# Patient Record
Sex: Female | Born: 1935 | Race: White | Hispanic: No | Smoking: Former smoker
Health system: Southern US, Community
[De-identification: ages and names within clinical notes are randomized; demographics above are authoritative.]

## PROBLEM LIST (undated history)

## (undated) DIAGNOSIS — I10 Essential (primary) hypertension: Secondary | ICD-10-CM

## (undated) DIAGNOSIS — I739 Peripheral vascular disease, unspecified: Secondary | ICD-10-CM

## (undated) DIAGNOSIS — E785 Hyperlipidemia, unspecified: Secondary | ICD-10-CM

## (undated) DIAGNOSIS — I639 Cerebral infarction, unspecified: Secondary | ICD-10-CM

## (undated) DIAGNOSIS — K5730 Diverticulosis of large intestine without perforation or abscess without bleeding: Secondary | ICD-10-CM

## (undated) DIAGNOSIS — H3530 Unspecified macular degeneration: Secondary | ICD-10-CM

## (undated) DIAGNOSIS — G459 Transient cerebral ischemic attack, unspecified: Secondary | ICD-10-CM

## (undated) DIAGNOSIS — C189 Malignant neoplasm of colon, unspecified: Secondary | ICD-10-CM

## (undated) DIAGNOSIS — M1990 Unspecified osteoarthritis, unspecified site: Secondary | ICD-10-CM

## (undated) DIAGNOSIS — K922 Gastrointestinal hemorrhage, unspecified: Secondary | ICD-10-CM

## (undated) DIAGNOSIS — K635 Polyp of colon: Secondary | ICD-10-CM

## (undated) DIAGNOSIS — J189 Pneumonia, unspecified organism: Secondary | ICD-10-CM

## (undated) HISTORY — DX: Polyp of colon: K63.5

## (undated) HISTORY — DX: Unspecified osteoarthritis, unspecified site: M19.90

## (undated) HISTORY — PX: TONSILLECTOMY: SUR1361

## (undated) HISTORY — DX: Cerebral infarction, unspecified: I63.9

## (undated) HISTORY — PX: BREAST LUMPECTOMY: SHX2

## (undated) HISTORY — DX: Gastrointestinal hemorrhage, unspecified: K92.2

## (undated) HISTORY — DX: Essential (primary) hypertension: I10

## (undated) HISTORY — DX: Diverticulosis of large intestine without perforation or abscess without bleeding: K57.30

## (undated) HISTORY — PX: EYE SURGERY: SHX253

## (undated) HISTORY — DX: Transient cerebral ischemic attack, unspecified: G45.9

## (undated) HISTORY — PX: APPENDECTOMY: SHX54

## (undated) HISTORY — PX: COLON SURGERY: SHX602

## (undated) HISTORY — DX: Hyperlipidemia, unspecified: E78.5

## (undated) HISTORY — DX: Malignant neoplasm of colon, unspecified: C18.9

## (undated) HISTORY — PX: BACK SURGERY: SHX140

---

## 1998-01-06 ENCOUNTER — Other Ambulatory Visit: Admission: RE | Admit: 1998-01-06 | Discharge: 1998-01-06 | Payer: Self-pay | Admitting: *Deleted

## 1999-01-14 ENCOUNTER — Other Ambulatory Visit: Admission: RE | Admit: 1999-01-14 | Discharge: 1999-01-14 | Payer: Self-pay | Admitting: *Deleted

## 1999-12-29 ENCOUNTER — Other Ambulatory Visit: Admission: RE | Admit: 1999-12-29 | Discharge: 1999-12-29 | Payer: Self-pay | Admitting: *Deleted

## 2001-01-01 ENCOUNTER — Other Ambulatory Visit: Admission: RE | Admit: 2001-01-01 | Discharge: 2001-01-01 | Payer: Self-pay | Admitting: *Deleted

## 2001-06-10 ENCOUNTER — Emergency Department (HOSPITAL_COMMUNITY): Admission: EM | Admit: 2001-06-10 | Discharge: 2001-06-11 | Payer: Self-pay

## 2001-06-10 ENCOUNTER — Encounter: Payer: Self-pay | Admitting: Emergency Medicine

## 2001-06-11 ENCOUNTER — Encounter: Payer: Self-pay | Admitting: Emergency Medicine

## 2002-07-16 ENCOUNTER — Other Ambulatory Visit: Admission: RE | Admit: 2002-07-16 | Discharge: 2002-07-16 | Payer: Self-pay | Admitting: *Deleted

## 2004-01-01 ENCOUNTER — Ambulatory Visit: Payer: Self-pay | Admitting: Internal Medicine

## 2004-01-07 ENCOUNTER — Ambulatory Visit: Payer: Self-pay | Admitting: Internal Medicine

## 2004-06-09 ENCOUNTER — Ambulatory Visit: Payer: Self-pay | Admitting: Internal Medicine

## 2004-07-14 ENCOUNTER — Ambulatory Visit: Payer: Self-pay | Admitting: Internal Medicine

## 2004-07-20 ENCOUNTER — Ambulatory Visit: Payer: Self-pay | Admitting: Internal Medicine

## 2004-12-10 ENCOUNTER — Ambulatory Visit: Payer: Self-pay | Admitting: Internal Medicine

## 2005-01-03 ENCOUNTER — Ambulatory Visit: Payer: Self-pay | Admitting: Internal Medicine

## 2005-01-10 ENCOUNTER — Ambulatory Visit: Payer: Self-pay | Admitting: Internal Medicine

## 2005-01-21 ENCOUNTER — Ambulatory Visit: Payer: Self-pay | Admitting: Internal Medicine

## 2005-02-03 ENCOUNTER — Ambulatory Visit: Payer: Self-pay | Admitting: Internal Medicine

## 2005-07-12 ENCOUNTER — Ambulatory Visit: Payer: Self-pay | Admitting: Internal Medicine

## 2005-07-15 ENCOUNTER — Encounter: Payer: Self-pay | Admitting: Internal Medicine

## 2005-07-15 LAB — CONVERTED CEMR LAB

## 2005-08-11 ENCOUNTER — Ambulatory Visit: Payer: Self-pay | Admitting: Internal Medicine

## 2005-08-12 ENCOUNTER — Encounter (INDEPENDENT_AMBULATORY_CARE_PROVIDER_SITE_OTHER): Payer: Self-pay | Admitting: *Deleted

## 2005-08-12 ENCOUNTER — Ambulatory Visit: Payer: Self-pay | Admitting: Cardiology

## 2005-11-25 ENCOUNTER — Ambulatory Visit: Payer: Self-pay | Admitting: Internal Medicine

## 2006-01-12 ENCOUNTER — Ambulatory Visit: Payer: Self-pay | Admitting: Internal Medicine

## 2006-01-12 LAB — CONVERTED CEMR LAB
ALT: 21 units/L (ref 0–40)
AST: 29 units/L (ref 0–37)
Albumin: 3.8 g/dL (ref 3.5–5.2)
Alkaline Phosphatase: 71 units/L (ref 39–117)
BUN: 23 mg/dL (ref 6–23)
Basophils Absolute: 0 10*3/uL (ref 0.0–0.1)
Basophils Relative: 0.5 % (ref 0.0–1.0)
Bilirubin Urine: NEGATIVE
CO2: 30 meq/L (ref 19–32)
Calcium: 9.6 mg/dL (ref 8.4–10.5)
Chloride: 103 meq/L (ref 96–112)
Chol/HDL Ratio, serum: 3.4
Cholesterol: 190 mg/dL (ref 0–200)
Creatinine, Ser: 1 mg/dL (ref 0.4–1.2)
Eosinophil percent: 3.2 % (ref 0.0–5.0)
GFR calc non Af Amer: 58 mL/min
Glomerular Filtration Rate, Af Am: 70 mL/min/{1.73_m2}
Glucose, Bld: 109 mg/dL — ABNORMAL HIGH (ref 70–99)
HCT: 40.4 % (ref 36.0–46.0)
HDL: 56.5 mg/dL (ref >39.0)
Hemoglobin, Urine: NEGATIVE
Hemoglobin: 13.4 g/dL (ref 12.0–15.0)
Ketones, ur: NEGATIVE mg/dL
LDL Cholesterol: 95 mg/dL (ref 0–99)
Leukocytes, UA: NEGATIVE
Lymphocytes Relative: 33.9 % (ref 12.0–46.0)
MCHC: 33.1 g/dL (ref 30.0–36.0)
MCV: 98.6 fL (ref 78.0–100.0)
Monocytes Absolute: 0.6 10*3/uL (ref 0.2–0.7)
Monocytes Relative: 9.5 % (ref 3.0–11.0)
Neutro Abs: 3.1 10*3/uL (ref 1.4–7.7)
Neutrophils Relative %: 52.9 % (ref 43.0–77.0)
Nitrite: NEGATIVE
Platelets: 208 10*3/uL (ref 150–400)
Potassium: 4.7 meq/L (ref 3.5–5.1)
RBC: 4.1 M/uL (ref 3.87–5.11)
RDW: 13.4 % (ref 11.5–14.6)
Sodium: 141 meq/L (ref 135–145)
Specific Gravity, Urine: 1.025 (ref 1.000–1.03)
TSH: 3.05 microintl units/mL (ref 0.35–5.50)
Total Bilirubin: 0.8 mg/dL (ref 0.3–1.2)
Total Protein, Urine: NEGATIVE mg/dL
Total Protein: 7 g/dL (ref 6.0–8.3)
Triglyceride fasting, serum: 192 mg/dL — ABNORMAL HIGH (ref 0–149)
Urine Glucose: NEGATIVE mg/dL
Urobilinogen, UA: 0.2 (ref 0.0–1.0)
VLDL: 38 mg/dL (ref 0–40)
WBC: 5.9 10*3/uL (ref 4.5–10.5)
pH: 5.5 (ref 5.0–8.0)

## 2006-09-08 DIAGNOSIS — I10 Essential (primary) hypertension: Secondary | ICD-10-CM | POA: Insufficient documentation

## 2006-09-08 DIAGNOSIS — J309 Allergic rhinitis, unspecified: Secondary | ICD-10-CM | POA: Insufficient documentation

## 2006-09-08 DIAGNOSIS — M109 Gout, unspecified: Secondary | ICD-10-CM | POA: Insufficient documentation

## 2006-09-08 DIAGNOSIS — E785 Hyperlipidemia, unspecified: Secondary | ICD-10-CM | POA: Insufficient documentation

## 2006-09-11 ENCOUNTER — Encounter: Payer: Self-pay | Admitting: Internal Medicine

## 2006-11-27 ENCOUNTER — Ambulatory Visit: Payer: Self-pay | Admitting: Internal Medicine

## 2007-01-04 ENCOUNTER — Encounter: Payer: Self-pay | Admitting: Internal Medicine

## 2007-01-09 ENCOUNTER — Encounter: Payer: Self-pay | Admitting: Internal Medicine

## 2007-01-14 ENCOUNTER — Encounter: Payer: Self-pay | Admitting: Internal Medicine

## 2007-01-15 ENCOUNTER — Encounter: Payer: Self-pay | Admitting: Internal Medicine

## 2007-01-16 ENCOUNTER — Ambulatory Visit: Payer: Self-pay | Admitting: Internal Medicine

## 2007-05-22 ENCOUNTER — Encounter: Payer: Self-pay | Admitting: Internal Medicine

## 2007-08-16 ENCOUNTER — Encounter: Payer: Self-pay | Admitting: Internal Medicine

## 2007-08-24 ENCOUNTER — Ambulatory Visit: Payer: Self-pay | Admitting: Internal Medicine

## 2007-10-12 ENCOUNTER — Ambulatory Visit: Payer: Self-pay | Admitting: Internal Medicine

## 2007-10-26 ENCOUNTER — Ambulatory Visit: Payer: Self-pay | Admitting: Internal Medicine

## 2007-11-09 ENCOUNTER — Ambulatory Visit: Payer: Self-pay | Admitting: Internal Medicine

## 2007-11-12 ENCOUNTER — Ambulatory Visit: Payer: Self-pay | Admitting: Internal Medicine

## 2007-11-12 ENCOUNTER — Encounter: Payer: Self-pay | Admitting: Internal Medicine

## 2007-11-13 ENCOUNTER — Ambulatory Visit: Payer: Self-pay | Admitting: Internal Medicine

## 2007-11-13 ENCOUNTER — Encounter: Payer: Self-pay | Admitting: Internal Medicine

## 2007-12-03 ENCOUNTER — Ambulatory Visit: Payer: Self-pay | Admitting: Internal Medicine

## 2007-12-03 ENCOUNTER — Telehealth: Payer: Self-pay | Admitting: Internal Medicine

## 2007-12-04 ENCOUNTER — Telehealth: Payer: Self-pay | Admitting: Internal Medicine

## 2007-12-04 ENCOUNTER — Encounter: Payer: Self-pay | Admitting: Internal Medicine

## 2007-12-18 ENCOUNTER — Telehealth: Payer: Self-pay | Admitting: Internal Medicine

## 2007-12-25 ENCOUNTER — Encounter: Payer: Self-pay | Admitting: Internal Medicine

## 2008-01-02 ENCOUNTER — Ambulatory Visit: Payer: Self-pay | Admitting: Internal Medicine

## 2008-01-02 ENCOUNTER — Telehealth: Payer: Self-pay | Admitting: Internal Medicine

## 2008-01-03 ENCOUNTER — Encounter: Payer: Self-pay | Admitting: Internal Medicine

## 2008-01-04 ENCOUNTER — Telehealth: Payer: Self-pay | Admitting: Internal Medicine

## 2008-01-07 ENCOUNTER — Encounter: Payer: Self-pay | Admitting: Internal Medicine

## 2008-01-08 ENCOUNTER — Encounter: Payer: Self-pay | Admitting: Internal Medicine

## 2008-01-23 ENCOUNTER — Ambulatory Visit: Payer: Self-pay | Admitting: Internal Medicine

## 2008-01-23 LAB — CONVERTED CEMR LAB
ALT: 24 units/L (ref 0–35)
AST: 26 units/L (ref 0–37)
Albumin: 3.5 g/dL (ref 3.5–5.2)
Alkaline Phosphatase: 64 units/L (ref 39–117)
BUN: 23 mg/dL (ref 6–23)
Basophils Absolute: 0 10*3/uL (ref 0.0–0.1)
Basophils Relative: 0.3 % (ref 0.0–3.0)
Bilirubin, Direct: 0.1 mg/dL (ref 0.0–0.3)
CO2: 36 meq/L — ABNORMAL HIGH (ref 19–32)
Calcium: 10.3 mg/dL (ref 8.4–10.5)
Chloride: 104 meq/L (ref 96–112)
Cholesterol: 145 mg/dL (ref 0–200)
Creatinine, Ser: 0.9 mg/dL (ref 0.4–1.2)
Eosinophils Absolute: 0.1 10*3/uL (ref 0.0–0.7)
Eosinophils Relative: 1.1 % (ref 0.0–5.0)
GFR calc Af Amer: 79 mL/min
GFR calc non Af Amer: 65 mL/min
Glucose, Bld: 94 mg/dL (ref 70–99)
HCT: 36.6 % (ref 36.0–46.0)
HDL: 44 mg/dL (ref >39.0)
Hemoglobin: 12.4 g/dL (ref 12.0–15.0)
LDL Cholesterol: 68 mg/dL (ref 0–99)
Lymphocytes Relative: 22.2 % (ref 12.0–46.0)
MCHC: 33.9 g/dL (ref 30.0–36.0)
MCV: 97.6 fL (ref 78.0–100.0)
Monocytes Absolute: 0.9 10*3/uL (ref 0.1–1.0)
Monocytes Relative: 11.4 % (ref 3.0–12.0)
Neutro Abs: 5.2 10*3/uL (ref 1.4–7.7)
Neutrophils Relative %: 65 % (ref 43.0–77.0)
Platelets: 160 10*3/uL (ref 150–400)
Potassium: 4.5 meq/L (ref 3.5–5.1)
RBC: 3.75 M/uL — ABNORMAL LOW (ref 3.87–5.11)
RDW: 13.9 % (ref 11.5–14.6)
Sodium: 143 meq/L (ref 135–145)
Total Bilirubin: 0.8 mg/dL (ref 0.3–1.2)
Total CHOL/HDL Ratio: 3.3
Total Protein: 7.1 g/dL (ref 6.0–8.3)
Triglycerides: 167 mg/dL — ABNORMAL HIGH (ref 0–149)
Uric Acid, Serum: 4.8 mg/dL (ref 2.4–7.0)
VLDL: 33 mg/dL (ref 0–40)
WBC: 8 10*3/uL (ref 4.5–10.5)

## 2008-02-14 ENCOUNTER — Telehealth (INDEPENDENT_AMBULATORY_CARE_PROVIDER_SITE_OTHER): Payer: Self-pay | Admitting: *Deleted

## 2008-02-22 ENCOUNTER — Ambulatory Visit: Payer: Self-pay | Admitting: Internal Medicine

## 2008-04-08 ENCOUNTER — Telehealth: Payer: Self-pay | Admitting: Internal Medicine

## 2008-04-24 DIAGNOSIS — K5790 Diverticulosis of intestine, part unspecified, without perforation or abscess without bleeding: Secondary | ICD-10-CM | POA: Insufficient documentation

## 2008-04-24 DIAGNOSIS — Z860100 Personal history of colon polyps, unspecified: Secondary | ICD-10-CM | POA: Insufficient documentation

## 2008-04-24 DIAGNOSIS — Z8719 Personal history of other diseases of the digestive system: Secondary | ICD-10-CM | POA: Insufficient documentation

## 2008-04-29 ENCOUNTER — Ambulatory Visit: Payer: Self-pay | Admitting: Internal Medicine

## 2008-06-27 ENCOUNTER — Telehealth: Payer: Self-pay | Admitting: Internal Medicine

## 2008-06-30 ENCOUNTER — Telehealth: Payer: Self-pay | Admitting: Internal Medicine

## 2008-07-03 ENCOUNTER — Ambulatory Visit: Payer: Self-pay | Admitting: Internal Medicine

## 2008-07-03 LAB — CONVERTED CEMR LAB
Bilirubin Urine: NEGATIVE
Hemoglobin, Urine: NEGATIVE
Ketones, ur: NEGATIVE mg/dL
Leukocytes, UA: NEGATIVE
Nitrite: NEGATIVE
Specific Gravity, Urine: 1.01 (ref 1.000–1.030)
Urine Glucose: NEGATIVE mg/dL
Urobilinogen, UA: 0.2 (ref 0.0–1.0)
pH: 7.5 (ref 5.0–8.0)

## 2008-08-19 ENCOUNTER — Ambulatory Visit: Payer: Self-pay | Admitting: Internal Medicine

## 2008-09-04 ENCOUNTER — Ambulatory Visit: Payer: Self-pay | Admitting: Internal Medicine

## 2008-11-25 ENCOUNTER — Ambulatory Visit: Payer: Self-pay | Admitting: Internal Medicine

## 2009-01-12 ENCOUNTER — Encounter: Payer: Self-pay | Admitting: Internal Medicine

## 2009-01-23 ENCOUNTER — Ambulatory Visit: Payer: Self-pay | Admitting: Internal Medicine

## 2009-01-23 LAB — CONVERTED CEMR LAB
ALT: 20 units/L (ref 0–35)
AST: 29 units/L (ref 0–37)
Albumin: 4 g/dL (ref 3.5–5.2)
Alkaline Phosphatase: 75 units/L (ref 39–117)
BUN: 16 mg/dL (ref 6–23)
Basophils Absolute: 0 10*3/uL (ref 0.0–0.1)
Basophils Relative: 0.5 % (ref 0.0–3.0)
Bilirubin, Direct: 0.2 mg/dL (ref 0.0–0.3)
CO2: 31 meq/L (ref 19–32)
Calcium: 9.3 mg/dL (ref 8.4–10.5)
Chloride: 99 meq/L (ref 96–112)
Cholesterol: 198 mg/dL (ref 0–200)
Creatinine, Ser: 0.8 mg/dL (ref 0.4–1.2)
Direct LDL: 94.8 mg/dL
Eosinophils Absolute: 0.2 10*3/uL (ref 0.0–0.7)
Eosinophils Relative: 2.9 % (ref 0.0–5.0)
GFR calc non Af Amer: 74.21 mL/min (ref >60)
Glucose, Bld: 105 mg/dL — ABNORMAL HIGH (ref 70–99)
HCT: 42.2 % (ref 36.0–46.0)
HDL: 66.7 mg/dL (ref >39.00)
Hemoglobin: 14.1 g/dL (ref 12.0–15.0)
Lymphocytes Relative: 34.6 % (ref 12.0–46.0)
Lymphs Abs: 1.9 10*3/uL (ref 0.7–4.0)
MCHC: 33.4 g/dL (ref 30.0–36.0)
MCV: 101.9 fL — ABNORMAL HIGH (ref 78.0–100.0)
Monocytes Absolute: 0.5 10*3/uL (ref 0.1–1.0)
Monocytes Relative: 9.9 % (ref 3.0–12.0)
Neutro Abs: 2.8 10*3/uL (ref 1.4–7.7)
Neutrophils Relative %: 52.1 % (ref 43.0–77.0)
Platelets: 140 10*3/uL — ABNORMAL LOW (ref 150.0–400.0)
Potassium: 4.2 meq/L (ref 3.5–5.1)
RBC: 4.14 M/uL (ref 3.87–5.11)
RDW: 13.1 % (ref 11.5–14.6)
Sodium: 139 meq/L (ref 135–145)
TSH: 1.62 microintl units/mL (ref 0.35–5.50)
Total Bilirubin: 1.1 mg/dL (ref 0.3–1.2)
Total CHOL/HDL Ratio: 3
Total Protein: 7.6 g/dL (ref 6.0–8.3)
Triglycerides: 222 mg/dL — ABNORMAL HIGH (ref 0.0–149.0)
Uric Acid, Serum: 7.2 mg/dL — ABNORMAL HIGH (ref 2.4–7.0)
VLDL: 44.4 mg/dL — ABNORMAL HIGH (ref 0.0–40.0)
WBC: 5.4 10*3/uL (ref 4.5–10.5)

## 2009-02-13 ENCOUNTER — Encounter: Payer: Self-pay | Admitting: Internal Medicine

## 2009-02-23 ENCOUNTER — Telehealth: Payer: Self-pay | Admitting: Internal Medicine

## 2009-07-22 ENCOUNTER — Ambulatory Visit: Payer: Self-pay | Admitting: Internal Medicine

## 2009-07-22 DIAGNOSIS — M25559 Pain in unspecified hip: Secondary | ICD-10-CM | POA: Insufficient documentation

## 2009-07-27 ENCOUNTER — Ambulatory Visit: Payer: Self-pay | Admitting: Internal Medicine

## 2009-08-24 ENCOUNTER — Telehealth: Payer: Self-pay | Admitting: Internal Medicine

## 2009-10-15 DIAGNOSIS — G459 Transient cerebral ischemic attack, unspecified: Secondary | ICD-10-CM

## 2009-10-15 HISTORY — DX: Transient cerebral ischemic attack, unspecified: G45.9

## 2009-10-27 ENCOUNTER — Encounter: Admission: RE | Admit: 2009-10-27 | Discharge: 2009-10-27 | Payer: Self-pay | Admitting: Orthopedic Surgery

## 2009-11-12 ENCOUNTER — Ambulatory Visit: Payer: Self-pay | Admitting: Diagnostic Radiology

## 2009-11-12 ENCOUNTER — Ambulatory Visit: Payer: Self-pay | Admitting: Internal Medicine

## 2009-11-12 ENCOUNTER — Inpatient Hospital Stay (HOSPITAL_COMMUNITY): Admission: EM | Admit: 2009-11-12 | Discharge: 2009-11-14 | Payer: Self-pay | Admitting: Internal Medicine

## 2009-11-12 ENCOUNTER — Encounter: Payer: Self-pay | Admitting: Emergency Medicine

## 2009-11-13 ENCOUNTER — Encounter (INDEPENDENT_AMBULATORY_CARE_PROVIDER_SITE_OTHER): Payer: Self-pay | Admitting: Internal Medicine

## 2009-11-13 ENCOUNTER — Ambulatory Visit: Payer: Self-pay | Admitting: Vascular Surgery

## 2009-11-17 ENCOUNTER — Ambulatory Visit: Payer: Self-pay | Admitting: Internal Medicine

## 2009-11-17 DIAGNOSIS — G459 Transient cerebral ischemic attack, unspecified: Secondary | ICD-10-CM | POA: Insufficient documentation

## 2009-12-02 ENCOUNTER — Encounter: Payer: Self-pay | Admitting: Internal Medicine

## 2009-12-03 ENCOUNTER — Ambulatory Visit: Payer: Self-pay | Admitting: Internal Medicine

## 2009-12-03 LAB — CONVERTED CEMR LAB
ALT: 21 units/L (ref 0–35)
AST: 25 units/L (ref 0–37)
Albumin: 3.7 g/dL (ref 3.5–5.2)
Alkaline Phosphatase: 64 units/L (ref 39–117)
BUN: 19 mg/dL (ref 6–23)
Bilirubin, Direct: 0.1 mg/dL (ref 0.0–0.3)
CO2: 31 meq/L (ref 19–32)
Calcium: 9.5 mg/dL (ref 8.4–10.5)
Chloride: 101 meq/L (ref 96–112)
Cholesterol: 149 mg/dL (ref 0–200)
Creatinine, Ser: 0.9 mg/dL (ref 0.4–1.2)
GFR calc non Af Amer: 63.01 mL/min (ref >60)
Glucose, Bld: 92 mg/dL (ref 70–99)
HDL: 49.1 mg/dL (ref >39.00)
LDL Cholesterol: 67 mg/dL (ref 0–99)
Potassium: 4.8 meq/L (ref 3.5–5.1)
Sodium: 139 meq/L (ref 135–145)
Total Bilirubin: 0.5 mg/dL (ref 0.3–1.2)
Total CHOL/HDL Ratio: 3
Total Protein: 6.7 g/dL (ref 6.0–8.3)
Triglycerides: 165 mg/dL — ABNORMAL HIGH (ref 0.0–149.0)
VLDL: 33 mg/dL (ref 0.0–40.0)

## 2009-12-08 ENCOUNTER — Ambulatory Visit: Payer: Self-pay | Admitting: Internal Medicine

## 2010-01-12 ENCOUNTER — Telehealth: Payer: Self-pay | Admitting: Internal Medicine

## 2010-01-12 ENCOUNTER — Ambulatory Visit: Payer: Self-pay | Admitting: Internal Medicine

## 2010-01-12 LAB — CONVERTED CEMR LAB
Bilirubin Urine: NEGATIVE
Hemoglobin, Urine: NEGATIVE
Ketones, ur: NEGATIVE mg/dL
Nitrite: NEGATIVE
Specific Gravity, Urine: <=1.005 (ref 1.000–1.030)
Total Protein, Urine: NEGATIVE mg/dL
Urine Glucose: NEGATIVE mg/dL
Urobilinogen, UA: 0.2 (ref 0.0–1.0)
pH: 7 (ref 5.0–8.0)

## 2010-01-13 ENCOUNTER — Encounter: Payer: Self-pay | Admitting: Internal Medicine

## 2010-01-20 ENCOUNTER — Encounter: Payer: Self-pay | Admitting: Internal Medicine

## 2010-02-04 ENCOUNTER — Ambulatory Visit: Payer: Self-pay | Admitting: Internal Medicine

## 2010-02-17 ENCOUNTER — Telehealth: Payer: Self-pay | Admitting: Internal Medicine

## 2010-03-14 LAB — CONVERTED CEMR LAB
ALT: 24 units/L (ref 0–35)
AST: 26 units/L (ref 0–37)
Albumin: 3.8 g/dL (ref 3.5–5.2)
Alkaline Phosphatase: 69 units/L (ref 39–117)
BUN: 17 mg/dL (ref 6–23)
Bilirubin, Direct: 0.1 mg/dL (ref 0.0–0.3)
CO2: 30 meq/L (ref 19–32)
Calcium: 9.7 mg/dL (ref 8.4–10.5)
Chloride: 100 meq/L (ref 96–112)
Cholesterol: 173 mg/dL (ref 0–200)
Creatinine, Ser: 0.8 mg/dL (ref 0.4–1.2)
GFR calc Af Amer: 90 mL/min
GFR calc non Af Amer: 75 mL/min
Glucose, Bld: 112 mg/dL — ABNORMAL HIGH (ref 70–99)
HDL: 55.3 mg/dL (ref >39.0)
LDL Cholesterol: 80 mg/dL (ref 0–99)
Pap Smear: NORMAL
Potassium: 4.4 meq/L (ref 3.5–5.1)
Sodium: 140 meq/L (ref 135–145)
TSH: 2.15 microintl units/mL (ref 0.35–5.50)
Total Bilirubin: 0.7 mg/dL (ref 0.3–1.2)
Total CHOL/HDL Ratio: 3.1
Total Protein: 7.3 g/dL (ref 6.0–8.3)
Triglycerides: 189 mg/dL — ABNORMAL HIGH (ref 0–149)
VLDL: 38 mg/dL (ref 0–40)

## 2010-03-16 NOTE — Progress Notes (Signed)
Summary: UTI   Phone Note Call from Patient   Summary of Call: Pt is on cipro for uti, she thinks she needs to come in for u/a next week to make sure there is no infection. Please advise.  Initial call taken by: Lamar Sprinkles,  Jun 30, 2008 9:55 AM  Follow-up for Phone Call        ok for f/u UA - 595.0 - to denae to handle Follow-up by: Corwin Levins MD,  Jun 30, 2008 10:53 AM  Additional Follow-up for Phone Call Additional follow up Details #1::        done [facility] idx ...[facility] Additional Follow-up by: Windell Norfolk,  Jun 30, 2008 11:04 AM    Additional Follow-up for Phone Call Additional follow up Details #2::    Pt informed  Follow-up by: Lamar Sprinkles,  Jun 30, 2008 2:45 PM

## 2010-03-16 NOTE — Assessment & Plan Note (Signed)
Summary: dr Debby Bud wants f/u appt end of day/cd   Vital Signs:  Patient Profile:   77 Years Old Female Height:     63 inches Weight:      174 pounds Temp:     98.8 degrees F oral Pulse rate:   66 / minute BP sitting:   146 / 80  (left arm) Cuff size:   regular  Vitals Entered By: Zackery Barefoot CMA (February 22, 2008 4:37 PM)                 PCP:  Kieley Akter   History of Present Illness: conference about care plannig for her spouse and herself. Family has been very concerned about the toll it is taking on her to be the 24/7 primary care giver.     Current Allergies: PENICILLIN ASA        Impression & Recommendations:  Problem # 1:  COUNSELING MARITAL&PARTNER PROBLEMS UNSPECIFIED (ICD-V61.10) Met for the purpose of long term planning for both the care of her spouse and what is best and safest for him but just as importantly what is best and safest for her in the long view. she wishes to try adult day care as a way of relieving some of her burden. Hospice and palliative care has been discussed but I do not know if he has a qualifying progrnosis.  I have recommended making a move oa congregate, integrated facility with multiple levels of care. this would be good for both of them. We discussed the logistical issues including property sale to accomplish this. I have encouraged her to start now(!) by looking at various facilities, e.g. [facility], [facility], etc and start thinking about what will be need to prepare the home for sale.   She does intellectually agree and will try the adult day care short-term and will give real consideration to starting the process of developing a long term plan for herself and her husband.  Complete Medication List: 1)  Enalapril Maleate 10 Mg Tabs (Enalapril maleate) .... Once daily 2)  Lipitor 20 Mg Tabs (Atorvastatin calcium) .... Q pm 3)  Allopurinol 100 Mg Tabs (Allopurinol) .... Once daily 4)  Fexofenadine Hcl 60 Mg Tabs (Fexofenadine  hcl) .... Two times a day 5)  Calcium 600 1500 Mg Tabs (Calcium carbonate) .... Two times a day 6)  Bl Vitamin C 500 Mg Tabs (Ascorbic acid) .... Once daily 7)  Multivitamin  .... Once daily 8)  Ocuvite Preservision Tabs (Multiple vitamins-minerals) .... Two times a day 9)  Vitamin D 1000 Unit Tabs (Cholecalciferol) .... Once daily 10)  Promethazine-codeine 6.25-10 Mg/40ml Syrp (Promethazine-codeine) .[name].. 1 tsp q 6 as needed cough

## 2010-03-16 NOTE — Assessment & Plan Note (Signed)
Summary: 3 wk f/u per men/cd   Vital Signs:  Patient profile:   77 year old female Height:      62 inches Weight:      183 pounds BMI:     33.59 O2 Sat:      97 % on Room air Temp:     98.2 degrees F oral Pulse rate:   68 / minute BP sitting:   122 / 64  (left arm) Cuff size:   regular  Vitals Entered By: Bill Salinas CMA (December 08, 2009 11:04 AM)  O2 Flow:  Room air CC: pt here for three week follow up, she states that she is only taking her Enalapril once a day instead of twice a day/ ab   Primary Care Provider:  Illene Regulus, MD  CC:  pt here for three week follow up and she states that she is only taking her Enalapril once a day instead of twice a day/ ab.  History of Present Illness: Patient presents for follow up of recent CVA and aggressive risk factor modification. She  is doing well and feeling well. She had an occular migraine 2 weeks ago but she did well. She has had rapid changes in refraction numbers and she has seen  both Dr. Dione Booze and [facility] and at this point her vision is stable. She is tolerating her medical regimen without problems or side effects.   Current Medications (verified): 1)  Enalapril Maleate 10 Mg  Tabs (Enalapril Maleate) .[name].. 1 Tab Two Times A Day 2)  Lipitor 40 Mg Tabs (Atorvastatin Calcium) .[name].. 1 Once Daily 3)  Allopurinol 100 Mg  Tabs (Allopurinol) .... Once Daily 4)  Fexofenadine Hcl 60 Mg  Tabs (Fexofenadine Hcl) .... Two Times A Day 5)  Calcium 600 1500 Mg  Tabs (Calcium Carbonate) .... Two Times A Day 6)  Bl Vitamin C 500 Mg  Tabs (Ascorbic Acid) .... Once Daily 7)  Multivitamins  Tabs (Multiple Vitamin) .... Take 1 Tablet By Mouth Once A Day 8)  Ocuvite Preservision   Tabs (Multiple Vitamins-Minerals) .... Two Times A Day 9)  Celebrex 200 Mg Caps (Celecoxib) .[name].. 1 By Mouth Two Times A Day Pc As Needed For Arthritis 10)  Fish Oil 1200 Mg Caps (Omega-3 Fatty Acids) .[name].. 1 Cap Two Times A Day 11)  Amlodipine Besylate 5 Mg Tabs  (Amlodipine Besylate) .[name].. 1 Tab Once Daily 12)  Plavix 75 Mg Tabs (Clopidogrel Bisulfate) .[name].. 1 Tab Once Daily 13)  Hydrochlorothiazide 12.5 Mg Caps (Hydrochlorothiazide) .[name].. 1 Cap Once Daily  Allergies (verified): 1)  Penicillin 2)  Asa  Past History:  Past Surgical History: Last updated: 01/16/2007 Lumpectomy-right '86 Tonsillectomy-remote  G6P3  Past Medical History: UNSPECIFIED TRANSIENT CEREBRAL ISCHEMIA (ICD-435.9) HIP PAIN, LEFT (ICD-719.45) COLONIC POLYPS, HYPERPLASTIC, HX OF (ICD-V12.72) DIVERTICULOSIS, COLON (ICD-562.10) CLOSTRIDIUM DIFFICILE COLITIS, HX OF (ICD-V12.79) HYPERTENSION (ICD-401.9) HYPERLIPIDEMIA (ICD-272.4) GOUT (ICD-274.9) ALLERGIC RHINITIS (ICD-477.9)    Physician Roster:        Maxwell Marion PSH reviewed for relevance, FH reviewed for relevance  Review of Systems       The patient complains of vision loss.  The patient denies anorexia, fever, weight loss, weight gain, decreased hearing, chest pain, dyspnea on exertion, abdominal pain, severe indigestion/heartburn, muscle weakness, difficulty walking, depression, abnormal bleeding, and enlarged lymph nodes.    Physical Exam  General:  Well-developed,well-nourished,in no acute distress; alert,appropriate and cooperative throughout examination Head:  Normocephalic and atraumatic without obvious abnormalities. No apparent alopecia or balding. Eyes:  C&S clear. Further exam deferred to opthal Neck:  supple.   Lungs:  normal respiratory effort and normal breath sounds.   Heart:  normal rate and regular rhythm.   Neurologic:  alert & oriented X3, speech clear, cranial nerves II-XII intact, and gait normal.   Skin:  turgor normal, color normal, and no suspicious lesions.   Psych:  Oriented X3, normally interactive, good eye contact, and not anxious appearing.     Impression & Recommendations:  Problem # 1:  UNSPECIFIED TRANSIENT CEREBRAL ISCHEMIA (ICD-435.9) Very good recovery. Discussed  hiatus on plavix for ESI back and will wait at least 8 full weeks before stopping plavix. She will contact ortho to determine the minimum time that she can be off plavix before procedure.  Her updated medication list for this problem includes:    Plavix 75 Mg Tabs (Clopidogrel bisulfate) .[name]... 1 tab once daily  Problem # 2:  HYPERTENSION (ICD-401.9)  Her updated medication list for this problem includes:    Enalapril Maleate 10 Mg Tabs (Enalapril maleate) .[name]... 1 once daily for blood pressure    Amlodipine Besylate 5 Mg Tabs (Amlodipine besylate) .[name]... 1 tab once daily    Hydrochlorothiazide 12.5 Mg Caps (Hydrochlorothiazide) .[name]... 1 cap once daily  BP today: 122/64 Prior BP: 132/70 (11/17/2009)  excellent control on present regimen. Will hold off on changing to Tribenzor  Problem # 3:  HYPERLIPIDEMIA (ICD-272.4) Recent lab with HDL 49 and LDL 67. At goal.  Plan - continue present medications.  Her updated medication list for this problem includes:    Lipitor 40 Mg Tabs (Atorvastatin calcium) .[name]... 1 once daily  Complete Medication List: 1)  Enalapril Maleate 10 Mg Tabs (Enalapril maleate) .[name].. 1 once daily for blood pressure 2)  Lipitor 40 Mg Tabs (Atorvastatin calcium) .[name].. 1 once daily 3)  Allopurinol 100 Mg Tabs (Allopurinol) .... Once daily 4)  Fexofenadine Hcl 60 Mg Tabs (Fexofenadine hcl) .... Two times a day 5)  Calcium 600 1500 Mg Tabs (Calcium carbonate) .... Two times a day 6)  Bl Vitamin C 500 Mg Tabs (Ascorbic acid) .... Once daily 7)  Multivitamins Tabs (Multiple vitamin) .... Take 1 tablet by mouth once a day 8)  Ocuvite Preservision Tabs (Multiple vitamins-minerals) .... Two times a day 9)  Celebrex 200 Mg Caps (Celecoxib) .[name].. 1 by mouth two times a day pc as needed for arthritis 10)  Fish Oil 1200 Mg Caps (Omega-3 fatty acids) .[name].. 1 cap two times a day 11)  Amlodipine Besylate 5 Mg Tabs (Amlodipine besylate) .[name].. 1 tab once daily 12)  Plavix 75 Mg Tabs  (Clopidogrel bisulfate) .[name].. 1 tab once daily 13)  Hydrochlorothiazide 12.5 Mg Caps (Hydrochlorothiazide) .[name].. 1 cap once daily Prescriptions: ALLOPURINOL 100 MG  TABS (ALLOPURINOL) once daily  #90 x 3   Entered and Authorized by:   Jacques Navy MD   Signed by:   Jacques Navy MD on 12/08/2009   Method used:   Faxed to ...       [facility] (mail-order)             , [place]         Ph: [phone]       Fax: [phone]   RxID:   [id number] HYDROCHLOROTHIAZIDE 12.5 MG CAPS (HYDROCHLOROTHIAZIDE) 1 cap once daily  #90 x 3   Entered and Authorized by:   Jacques Navy MD   Signed by:   Jacques Navy MD on 12/08/2009   Method  used:   Faxed to ...       [facility] (mail-order)             , [place]         Ph: [phone]       Fax: [phone]   RxID:   [id number] ENALAPRIL MALEATE 10 MG  TABS (ENALAPRIL MALEATE) 1 once daily for blood pressure  #90 x 3   Entered and Authorized by:   Jacques Navy MD   Signed by:   Jacques Navy MD on 12/08/2009   Method used:   Faxed to ...       [facility] (mail-order)             , [place]         Ph: [phone]       Fax: [phone]   RxID:   [id number] PLAVIX 75 MG TABS (CLOPIDOGREL BISULFATE) 1 tab once daily  #90 x 3   Entered and Authorized by:   Jacques Navy MD   Signed by:   Jacques Navy MD on 12/08/2009   Method used:   Faxed to ...       [facility] (mail-order)             , [place]         Ph: [phone]       Fax: [phone]   RxID:   [id number] AMLODIPINE BESYLATE 5 MG TABS (AMLODIPINE BESYLATE) 1 tab once daily  #90 x 3   Entered and Authorized by:   Jacques Navy MD   Signed by:   Jacques Navy MD on 12/08/2009   Method used:   Faxed to ...       [facility] (mail-order)             , [place]         Ph: [phone]       Fax: [phone]   RxID:   [id number] LIPITOR 40 MG TABS (ATORVASTATIN CALCIUM) 1 once daily  #90 x 3   Entered and Authorized by:   Jacques Navy MD   Signed by:   Jacques Navy MD on 12/08/2009   Method used:   Faxed to ...       [facility] (mail-order)             , [place]         Ph: [phone]       Fax: [phone]   RxID:   [id number]    Orders Added: 1)  Est. Patient Level III [52778]   Immunization History:  Influenza Immunization History:    Influenza:  historical (11/14/2009)   Immunization History:  Influenza Immunization History:    Influenza:  Historical (11/14/2009)

## 2010-03-16 NOTE — Progress Notes (Signed)
Summary: Celebrex  Phone Note Refill Request Call back at Home Phone [phone] Message from:  Patient on August 24, 2009 1:12 PM  Refills Requested: Medication #1:  CELEBREX 200 MG CAPS 1 by mouth two times a day pc as needed for arthritis   Supply Requested: 90day Patient requests mail order of the above. Please advise.  Initial call taken by: Lucious Groves,  August 24, 2009 1:12 PM  Follow-up for Phone Call        ok for celebrex refill for 90 day supply with 3 refills Follow-up by: Jacques Navy MD,  August 24, 2009 1:49 PM  Additional Follow-up for Phone Call Additional follow up Details #1::        Pt informed  Additional Follow-up by: Lamar Sprinkles, CMA,  August 24, 2009 3:18 PM    Prescriptions: CELEBREX 200 MG CAPS (CELECOXIB) 1 by mouth two times a day pc as needed for arthritis  #180 x 3   Entered by:   Lamar Sprinkles, CMA   Authorized by:   Jacques Navy MD   Signed by:   Lamar Sprinkles, CMA on 08/24/2009   Method used:   Faxed to ...       [facility] (retail)             ,          Ph: [phone]       Fax: [phone]   RxID:   [id number]

## 2010-03-16 NOTE — Letter (Signed)
   [facility] [address] [place], [place]  [address] Phone: [phone]      January 16, 2007   [facility] [address] [place], [place] [address]  RE:  LAB RESULTS  Dear  Ms. Darin,  The following is an interpretation of your most recent lab tests.  Please take note of any instructions provided or changes to medications that have resulted from your lab work.  ELECTROLYTES:  Good - no changes needed  KIDNEY FUNCTION TESTS:  Good - no changes needed  LIPID PANEL:  Good - no changes needed Triglyceride: 189   Cholesterol: 173   LDL: 80   HDL: 55.3   Chol/HDL%:  3.1 CALC  THYROID STUDIES:  Thyroid studies normal TSH: 2.15     DIABETIC STUDIES:  Excellent - no changes needed Blood Glucose: 112     CBC:  Good - no changes needed

## 2010-03-16 NOTE — Progress Notes (Signed)
Summary: [facility]  Phone Note From Other Clinic   Caller: [facility] Summary of Call: [facility] called with lab reprt .Marland KitchenMarland KitchenMarland KitchenPt [facility] tested positive for C Diff Initial call taken by: Windell Norfolk,  December 04, 2007 9:48 AM  Follow-up for Phone Call        call patient - positive for C. diff. Continue flagyl. Let me know if diarrhea doesn't stop. Follow-up by: Jacques Navy MD,  December 04, 2007 1:22 PM  Additional Follow-up for Phone Call Additional follow up Details #1::        spoke with pt made her aware of doctors instructions and she said ok will call back if she has any more problems Additional Follow-up by: Windell Norfolk,  December 04, 2007 3:19 PM

## 2010-03-16 NOTE — Letter (Signed)
Summary: BP readings/Patient  BP readings/Patient   Imported By: Sherian Rein 02/25/2009 10:39:40  _____________________________________________________________________  External Attachment:    Type:   Image     Comment:   External Document

## 2010-03-16 NOTE — Progress Notes (Signed)
       New/Updated Medications: ENALAPRIL MALEATE 10 MG  TABS (ENALAPRIL MALEATE) 1 tab two times a day Prescriptions: ENALAPRIL MALEATE 10 MG  TABS (ENALAPRIL MALEATE) 1 tab two times a day  #180 x 3   Entered by:   Ami Bullins CMA   Authorized by:   Jacques Navy MD   Signed by:   Bill Salinas CMA on 02/23/2009   Method used:   Electronically to        [facility]* (mail-order)             ,          Ph: [phone]       Fax: [phone]   RxID:   [id number]

## 2010-03-16 NOTE — Letter (Signed)
Summary: [facility]  [facility]   Imported By: Esmeralda Links D'jimraou 01/31/2008 14:05:31  _____________________________________________________________________  External Attachment:    Type:   Image     Comment:   External Document

## 2010-03-16 NOTE — Assessment & Plan Note (Signed)
Summary: buttock pain/men pt/cd   Vital Signs:  Patient profile:   77 year old female Height:      62 inches Weight:      185 pounds BMI:     33.96 O2 Sat:      96 % on Room air Temp:     97.7 degrees F oral Pulse rate:   72 / minute BP sitting:   160 / 82  (left arm) Cuff size:   regular  Vitals Entered By: Lucious Groves (July 22, 2009 1:18 PM)  O2 Flow:  Room air CC: C/O buttock pain on/off since Jan. Is Patient Diabetic? No Pain Assessment Patient in pain? yes     Location: buttock Intensity: 3 Onset of pain  on/of since jan.   Primary Care Provider:  Illene Regulus, MD  CC:  C/O buttock pain on/off since Jan..  History of Present Illness: C/o dull annoying pain in L buttock x weeks worse w/leg lifting when sitting. No injury  Current Medications (verified): 1)  Enalapril Maleate 10 Mg  Tabs (Enalapril Maleate) .[name].. 1 Tab Two Times A Day 2)  Lipitor 20 Mg  Tabs (Atorvastatin Calcium) .... Q Pm 3)  Allopurinol 100 Mg  Tabs (Allopurinol) .... Once Daily 4)  Fexofenadine Hcl 60 Mg  Tabs (Fexofenadine Hcl) .... Two Times A Day 5)  Calcium 600 1500 Mg  Tabs (Calcium Carbonate) .... Two Times A Day 6)  Bl Vitamin C 500 Mg  Tabs (Ascorbic Acid) .... Once Daily 7)  Multivitamins  Tabs (Multiple Vitamin) .... Take 1 Tablet By Mouth Once A Day 8)  Ocuvite Preservision   Tabs (Multiple Vitamins-Minerals) .... Two Times A Day 9)  Fish Oil 1200mg  .... 1 By Mouth Qd 10)  Tylenol Es .[name].. 1 By Mouth Bid  Allergies (verified): 1)  Penicillin 2)  Asa  Review of Systems  The patient denies fever, abdominal pain, melena, muscle weakness, suspicious skin lesions, and difficulty walking.    Physical Exam  General:  WNWD white female, overweight, in no distress Nose:  no external deformity and no external erythema.   Mouth:  Oral mucosa and oropharynx without lesions or exudates.  Teeth in good repair. Lungs:  Normal respiratory effort, chest expands symmetrically. Lungs are  clear to auscultation, no crackles or wheezes. Heart:  Normal rate and regular rhythm. S1 and S2 normal without gallop, murmur, click, rub or other extra sounds. Abdomen:  soft, non-tender, normal bowel sounds, no distention, and no hepatomegaly.   Msk:  normal ROM, no joint tenderness, no joint swelling, no joint warmth, and no joint deformities.  L ischial bone is tender to palp. Neurologic:  alert & oriented X3, cranial nerves II-XII intact, gait normal, and DTRs symmetrical and normal.   Skin:  turgor normal, color normal, no rashes, and no suspicious lesions.   Psych:  Oriented X3, memory intact for recent and remote, normally interactive, and good eye contact.     Impression & Recommendations:  Problem # 1:  HIP PAIN, LEFT (ICD-719.45) ischial bursitis Assessment New  Dr Luiz Blare if not well Her updated medication list for this problem includes:    Tramadol Hcl 50 Mg Tabs (Tramadol hcl) .[name]... 1-2 tabs by mouth two times a day as needed pain    Celebrex 200 Mg Caps (Celecoxib) .[name]... 1 by mouth two times a day pc as needed for arthritis  Orders: T-Pelvis 1or 2 views ([id number])  Complete Medication List: 1)  Enalapril Maleate 10 Mg Tabs (Enalapril  maleate) .[name].. 1 tab two times a day 2)  Lipitor 20 Mg Tabs (Atorvastatin calcium) .... Q pm 3)  Allopurinol 100 Mg Tabs (Allopurinol) .... Once daily 4)  Fexofenadine Hcl 60 Mg Tabs (Fexofenadine hcl) .... Two times a day 5)  Calcium 600 1500 Mg Tabs (Calcium carbonate) .... Two times a day 6)  Bl Vitamin C 500 Mg Tabs (Ascorbic acid) .... Once daily 7)  Multivitamins Tabs (Multiple vitamin) .... Take 1 tablet by mouth once a day 8)  Ocuvite Preservision Tabs (Multiple vitamins-minerals) .... Two times a day 9)  Tramadol Hcl 50 Mg Tabs (Tramadol hcl) .[name].. 1-2 tabs by mouth two times a day as needed pain 10)  Pennsaid 1.5 % Soln (Diclofenac sodium) .... 3-5 gtt on skin three times a day for pain 11)  Celebrex 200 Mg Caps (Celecoxib) .[name].. 1  by mouth two times a day pc as needed for arthritis  Patient Instructions: 1)  Get a memory foam contour pillow 2)  Ice 3)  Use stretching and balance exercises that I have provided (15 min. or longer every day) Prescriptions: CELEBREX 200 MG CAPS (CELECOXIB) 1 by mouth two times a day pc as needed for arthritis  #60 x 12   Entered and Authorized by:   Tresa Garter MD   Signed by:   Tresa Garter MD on 07/22/2009   Method used:   Print then Give to Patient   RxID:   [id number] PENNSAID 1.5 % SOLN (DICLOFENAC SODIUM) 3-5 gtt on skin three times a day for pain  #1 x 3   Entered and Authorized by:   Tresa Garter MD   Signed by:   Tresa Garter MD on 07/22/2009   Method used:   Print then Give to Patient   RxID:   [id number] TRAMADOL HCL 50 MG TABS (TRAMADOL HCL) 1-2 tabs by mouth two times a day as needed pain  #120 x 3   Entered and Authorized by:   Tresa Garter MD   Signed by:   Tresa Garter MD on 07/22/2009   Method used:   Print then Give to Patient   RxID:   [id number]

## 2010-03-16 NOTE — Procedures (Signed)
Summary: Colonoscopy   Colonoscopy  Procedure date:  11/12/2007  Findings:      Location:  [facility].    Procedures Next Due Date:    Colonoscopy: 11/2012  Patient Name: Gina [name], Gina [name] MRN:  Procedure Procedures: Colonoscopy CPT: [id number].    with biopsy. CPT: [id number].  Personnel: Endoscopist: Alette Kataoka L. Juanda Chance, MD.  Exam Location: Exam performed in Outpatient Clinic. Outpatient  Patient Consent: Procedure, Alternatives, Risks and Benefits discussed, consent obtained, from patient. Consent was obtained by the RN.  Indications  Surveillance of: Adenomatous Polyp(s). Initial polypectomy was performed in 2004. 1-2 Polyps were found at Index Exam. Largest polyp removed was 1 to 5 mm. Prior polyp located in distal colon. Pathology of worst  polyp: hyperplastic. Previous surveillance exam(s) in  1999,  Increased Risk Screening: For family history of colorectal neoplasia, in  parent age at onset: 28.  History  Current Medications: Patient is not currently taking Coumadin.  Pre-Exam Physical: Performed Nov 12, 2007. Cardio-pulmonary exam, Rectal exam, HEENT exam , Abdominal exam, Extremity exam, Neurological exam, Mental status exam WNL.  Comments: Pt. history reviewed/updated, physical exam performed prior to initiation of sedation?yes Exam Exam: Extent of exam reached: Cecum, extent intended: Cecum.  The cecum was identified by appendiceal orifice and IC valve. Duration of exam: time in 5:08 min, out 9:09 min minutes. Colon retroflexion performed. Images taken. ASA Classification: II. Tolerance: good.  Monitoring: Pulse and BP monitoring, Oximetry used. Supplemental O2 given.  Colon Prep Used Miralax for colon prep. Prep results: good.  Sedation Meds: Patient assessed and found to be appropriate for moderate (conscious) sedation. Fentanyl 100 mcg. given IV. Versed 10 mg. given IV.  Findings - NORMAL EXAM: Cecum.  - DIVERTICULOSIS: Descending Colon  to Sigmoid Colon. ICD9: Diverticulosis: 562.10. Comments: moderately severe, thick folds, many shallow diverticuli, no obstruction.  - MULTIPLE POLYPS: Rectum. minimum size 2 mm, maximum size 3 mm. Procedure:  biopsy without cautery, removed, Polyp retrieved, 2 polyps Polyps sent to pathology. ICD9: Colon Polyps: 211.3. Comments: 2 diminutive polyps at 5-8 cm removed .  - NORMAL EXAM: Rectum.   Assessment Abnormal examination, see findings above.  Diagnoses: 562.10: Diverticulosis.  211.3: Colon Polyps.   Comments: 2 diminutive polyps of the rectum removed Events  Unplanned Interventions: No intervention was required.  Unplanned Events: There were no complications. Plans Medication Plan: Await pathology. Fiber supplements: Psyllium 1 Tbsp QD, starting Nov 12, 2007   Patient Education: Patient given standard instructions for: Patient instructed to get routine colonoscopy every 5 years.  Disposition: After procedure patient sent to recovery. After recovery patient sent home.    cc: Illene Regulus, MD      REPORT OF SURGICAL PATHOLOGY   Case #: [id number] Patient Name: Gina [name] Office Chart Number:  N/A   MRN: [id number] Pathologist: Ferd Hibbs. Colonel Bald, MD DOB/Age  Nov 28, 1931 (Age: 77)    Gender: F Date Taken:  11/12/2007 Date Received: 11/12/2007   FINAL DIAGNOSIS   ***MICROSCOPIC EXAMINATION AND DIAGNOSIS***   RECTUM, POLYPS:  - HYPERPLASTIC POLYPS (2). - THERE IS NO EVIDENCE OF MALIGNANCY.    mj Date Reported:  11/13/2007     Ferd Hibbs. Colonel Bald, MD *** Electronically Signed Out By JBK ***   Clinical information Family history of colon cancer.  Personal history of polyps (kv)   specimen(s) obtained Rectum, polyp(s)   Gross Description Received in formalin are tan, soft tissue fragments that are submitted in toto.  Number:  3  Size:  0.3 cm.   Block Summary:  In toto in one cassette.   (JBM:cdc 11/12/07)        cc/     Signed by  Hart Carwin MD on 11/13/2007 at 10:13 PM  ________________________________________________________________________ recall colon 5 years   Signed by Hart Carwin MD on 11/13/2007 at 10:13 PM  ________________________________________________________________________    November 13, 2007 MRN: [id number]    [facility] [address] [place], [place]  [address]    Dear Gina [name],  I am pleased to inform you that the colon polyp(s) removed during your recent colonoscopy was (were) found to be benign (no cancer detected) upon pathologic examination.The polyp was hyperplastic ( not premalignant)  I recommend you have a repeat colonoscopy examination in 5_ years to look for recurrent polyps, as having colon polyps increases your risk for having recurrent polyps or even colon cancer in the future.  Should you develop new or worsening symptoms of abdominal pain, bowel habit changes or bleeding from the rectum or bowels, please schedule an evaluation with either your primary care physician or with me.  Additional information/recommendations:  _x_ No further action with gastroenterology is needed at this time. Please      follow-up with your primary care physician for your other healthcare      needs.  _  Please call us if you are having persistent problems or have questions about your condition that have not been fully answered at this time.  Sincerely,  Hart Carwin MD  This letter has been electronically signed by your physician.   Signed by Hart Carwin MD on 11/13/2007 at 10:14 PM  ________________________________________________________________________

## 2010-03-16 NOTE — Letter (Signed)
Summary: [facility]  [facility]   Imported By: Sherian Rein 12/08/2009 15:01:23  _____________________________________________________________________  External Attachment:    Type:   Image     Comment:   External Document

## 2010-03-16 NOTE — Progress Notes (Signed)
Summary: U/A  Phone Note Call from Patient Call back at Home Phone [phone] Call back at [phone]   Summary of Call: Pt is concerned she may have a uti. ok U/A?  Initial call taken by: Lamar Sprinkles, CMA,  January 12, 2010 12:46 PM  Follow-up for Phone Call        OK U/A per md, Pt informed - HOLD PHONE NOTE FOR RESULTS Follow-up by: Lamar Sprinkles, CMA,  January 12, 2010 1:05 PM  Additional Follow-up for Phone Call Additional follow up Details #1::        minimally positive U/A. If symptomatic - septra DS two times a day x 3 days.  Additional Follow-up by: Jacques Navy MD,  January 13, 2010 9:38 AM    Additional Follow-up for Phone Call Additional follow up Details #2::    Pt is symptomatic - rx called in  Follow-up by: Lamar Sprinkles, CMA,  January 13, 2010 10:52 AM  New/Updated Medications: SEPTRA DS 800-160 MG TABS (SULFAMETHOXAZOLE-TRIMETHOPRIM) 1 two times a day x 3 days Prescriptions: SEPTRA DS 800-160 MG TABS (SULFAMETHOXAZOLE-TRIMETHOPRIM) 1 two times a day x 3 days  #6 x 0   Entered by:   Lamar Sprinkles, CMA   Authorized by:   Jacques Navy MD   Signed by:   Lamar Sprinkles, CMA on 01/13/2010   Method used:   Electronically to        [facility] [phone]* (retail)       [address]       [place], [place]  [address]       Ph: [phone]       Fax: [phone]   RxID:   [id number]

## 2010-03-16 NOTE — Letter (Signed)
Summary: Patient Notice- Polyp Results  [facility]  [address] [place], [place] [address]   Phone: [phone]  Fax: [phone]        November 13, 2007 MRN: [id number]    [facility] [address] [place], [place]  [address]    Dear Ms. Maloney,  I am pleased to inform you that the colon polyp(s) removed during your recent colonoscopy was (were) found to be benign (no cancer detected) upon pathologic examination.The polyp was hyperplastic ( not premalignant)  I recommend you have a repeat colonoscopy examination in 5_ years to look for recurrent polyps, as having colon polyps increases your risk for having recurrent polyps or even colon cancer in the future.  Should you develop new or worsening symptoms of abdominal pain, bowel habit changes or bleeding from the rectum or bowels, please schedule an evaluation with either your primary care physician or with me.  Additional information/recommendations:  _x_ No further action with gastroenterology is needed at this time. Please      follow-up with your primary care physician for your other healthcare      needs.  _  Please call us if you are having persistent problems or have questions about your condition that have not been fully answered at this time.  Sincerely,  Hart Carwin MD  This letter has been electronically signed by your physician.

## 2010-03-16 NOTE — Assessment & Plan Note (Signed)
Summary: FLU SHOT/NWS  Nurse Visit   Allergies: 1)  Penicillin 2)  Asa  Orders Added: 1)  Flu Vaccine 50yrs + [90658] 2)  Administration Flu vaccine - MCR [G0008]      Flu Vaccine Consent Questions     Do you have a history of severe allergic reactions to this vaccine? no    Any prior history of allergic reactions to egg and/or gelatin? no    Do you have a sensitivity to the preservative Thimersol? no    Do you have a past history of Guillan-Barre Syndrome? no    Do you currently have an acute febrile illness? no    Have you ever had a severe reaction to latex? no    Vaccine information given and explained to patient? yes    Are you currently pregnant? no    Lot Number:[id number]   Exp Date:08/13/2009   Site Given  Left Deltoid IMlu

## 2010-03-16 NOTE — Progress Notes (Signed)
Summary: UTI?  Phone Note Call from Patient   Caller: [phone] John Summary of Call: Pt has urinary complaints, feels that she is getting uti. She on her way now to her husband's funeral. Patient is requesting rx for cipro.  Initial call taken by: Lamar Sprinkles,  Jun 27, 2008 11:29 AM  Follow-up for Phone Call        Lf mess for john Follow-up by: Lamar Sprinkles,  Jun 27, 2008 11:49 AM    New/Updated Medications: CIPRO 250 MG TABS (CIPROFLOXACIN HCL) One by mouth two times a day for 7 days   Prescriptions: CIPRO 250 MG TABS (CIPROFLOXACIN HCL) One by mouth two times a day for 7 days  #14 x 1   Entered and Authorized by:   Etta Grandchild MD   Signed by:   Etta Grandchild MD on 06/27/2008   Method used:   Electronically to        [facility] [phone]* (retail)       [address]       [place], [place]  [address]       Ph: [phone]       Fax: [phone]   RxID:   [id number]

## 2010-03-16 NOTE — Assessment & Plan Note (Signed)
Summary: breast exam per pt/not cpx/#/cd   Vital Signs:  Patient profile:   77 year old female Height:      62 inches Weight:      183 pounds BMI:     33.59 O2 Sat:      97 % on Room air Temp:     96.9 degrees F oral Pulse rate:   71 / minute BP sitting:   122 / 70  (left arm) Cuff size:   regular  Vitals Entered By: Bill Salinas CMA (July 27, 2009 10:35 AM)  O2 Flow:  Room air CC: pt here to get breast exam and to discuss fexofenadine (not working) ?ing another alternative/ ab   Primary Care Provider:  Illene Regulus, MD  CC:  pt here to get breast exam and to discuss fexofenadine (not working) ?ing another alternative/ ab.  History of Present Illness: Patient was seen June 8 for pain at the ischial tuberosity. She was treated with celebrex. She did not take tramadol - it made her feel bad and she did try the diclofenac topical . Now, the pain is gone!  She reports that the allegra has not been helping like it did. She has increased rhinnitis but no signs of infectiion.  She is due for an annual breast exam.   Current Medications (verified): 1)  Enalapril Maleate 10 Mg  Tabs (Enalapril Maleate) .[name].. 1 Tab Two Times A Day 2)  Lipitor 20 Mg  Tabs (Atorvastatin Calcium) .... Q Pm 3)  Allopurinol 100 Mg  Tabs (Allopurinol) .... Once Daily 4)  Fexofenadine Hcl 60 Mg  Tabs (Fexofenadine Hcl) .... Two Times A Day 5)  Calcium 600 1500 Mg  Tabs (Calcium Carbonate) .... Two Times A Day 6)  Bl Vitamin C 500 Mg  Tabs (Ascorbic Acid) .... Once Daily 7)  Multivitamins  Tabs (Multiple Vitamin) .... Take 1 Tablet By Mouth Once A Day 8)  Ocuvite Preservision   Tabs (Multiple Vitamins-Minerals) .... Two Times A Day 9)  Tramadol Hcl 50 Mg Tabs (Tramadol Hcl) .[name].. 1-2 Tabs By Mouth Two Times A Day As Needed Pain 10)  Pennsaid 1.5 % Soln (Diclofenac Sodium) .... 3-5 Gtt On Skin Three Times A Day For Pain 11)  Celebrex 200 Mg Caps (Celecoxib) .[name].. 1 By Mouth Two Times A Day Pc As Needed For  Arthritis 12)  Fish Oil 1200 Mg Caps (Omega-3 Fatty Acids) .[name].. 1 Cap Two Times A Day  Allergies (verified): 1)  Penicillin 2)  Asa  Past History:  Past Medical History: Last updated: 04/24/2008 Allergic rhinitis Current Problems:  ANAL PRURITUS (ICD-698.0) RECTAL BLEEDING (ICD-569.3) COLONIC POLYPS, HYPERPLASTIC, HX OF (ICD-V12.72) DIVERTICULOSIS, COLON (ICD-562.10) CLOSTRIDIUM DIFFICILE COLITIS, HX OF (ICD-V12.79) COUNSELING MARITAL&PARTNER PROBLEMS UNSPECIFIED (ICD-V61.10) UTI (ICD-599.0) PSYCHOSOCIAL PROBLEM (ICD-V62.9) BACK PAIN, ACUTE (ICD-724.5) HYPERTENSION (ICD-401.9) HYPERLIPIDEMIA (ICD-272.4) GOUT (ICD-274.9) ALLERGIC RHINITIS (ICD-477.9)  Physician Roster:        Maxwell Marion  Past Surgical History: Last updated: 2007-02-01 Lumpectomy-right '86 Tonsillectomy-remote  G6P3  Family History: Last updated: 01-Feb-2007 father- died 85, Colon Cancer, HTN, Lipids, CAD, PUD mother- died 54, CHF, CAD, Breast Cancer bilateral, HTN, Lipids  Social History: Last updated: 01/23/2009 married 1955-widowed '10 3 sons: '57, '59, 62 6 grandchildren retired I-ADLs SO had  alzheimer's and blindness and hallucinosis/psychosis requiring 24/7 care. Passed away 09-Jul-2022'10 Her son is living at home with her for now (June '10)-moved to [place] (July '10) End of life Care: cardiac resuscitation - yes, short-term mechanical ventilation -  yes; no long term heroic support. End of LIfe: no extra-ordinary measures: Wants Cardiac Resuscitaiton, mechanical ventilation if needed. Does not want to  be kept in a persistent vegative state or long-term artificial life support.  Review of Systems  The patient denies anorexia, fever, weight loss, weight gain, decreased hearing, chest pain, dyspnea on exertion, peripheral edema, abdominal pain, hematochezia, muscle weakness, difficulty walking, and abnormal bleeding.    Physical Exam  General:  Overweight white female Eyes:  vision  grossly intact, pupils equal, and pupils round.   Neck:  supple, full ROM, and no thyromegaly.   Chest Wall:  no deformities.   Breasts:  No mass, nodules, thickening, tenderness, bulging, retraction, inflamation, nipple discharge or skin changes noted.   Lungs:  normal respiratory effort and normal breath sounds.   Heart:  normal rate, regular rhythm, and no murmur.   Abdomen:  soft and normal bowel sounds.   Msk:  normal gait. No limitation in movement through the esam room or stepping up to the exam table. Pulses:  2+ radial Neurologic:  alert & oriented X3 and gait normal.   Skin:  turgor normal, color normal, and no rashes.   Cervical Nodes:  no anterior cervical adenopathy and no posterior cervical adenopathy.   Axillary Nodes:  no R axillary adenopathy and no L axillary adenopathy.   Psych:  Oriented X3, normally interactive, and good eye contact.     Impression & Recommendations:  Problem # 1:  HIP PAIN, LEFT (ICD-719.45) Reveiwed Dr. Loren Racer note and xx-ray  images and report with the patient. He pain is now resolved suggessting a musculo-skeletal injury.  Plan - avoid heavy lifting in the future  The following medications were removed from the medication list:    Tramadol Hcl 50 Mg Tabs (Tramadol hcl) .[name]... 1-2 tabs by mouth two times a day as needed pain Her updated medication list for this problem includes:    Celebrex 200 Mg Caps (Celecoxib) .[name]... 1 by mouth two times a day pc as needed for arthritis  Problem # 2:  HYPERTENSION (ICD-401.9)  Her updated medication list for this problem includes:    Enalapril Maleate 10 Mg Tabs (Enalapril maleate) .[name]... 1 tab two times a day  BP today: 122/70 Prior BP: 160/82 (07/22/2009)  Labs Reviewed: K+: 4.2 (01/23/2009) Creat: : 0.8 (01/23/2009)   Chol: 198 (01/23/2009)   HDL: 66.70 (01/23/2009)   LDL: 68 (01/23/2008)   TG: 222.0 (01/23/2009)  Good control.  Problem # 3:  ALLERGIC RHINITIS (ICD-477.9) No infection but  poorly controlled rhinnits.  Plan - trial of loratadine 10mg  or otc zyrtec.  Her updated medication list for this problem includes:    Fexofenadine Hcl 60 Mg Tabs (Fexofenadine hcl) .[name]..[name] Two times a day  Problem # 4:  Screening Breast Cancer (ICD-V76.10) Normal breast exam without lesions or changes. She is current with mammography.   Complete Medication List: 1)  Enalapril Maleate 10 Mg Tabs (Enalapril maleate) .[name].. 1 tab two times a day 2)  Lipitor 20 Mg Tabs (Atorvastatin calcium) .... Q pm 3)  Allopurinol 100 Mg Tabs (Allopurinol) .... Once daily 4)  Fexofenadine Hcl 60 Mg Tabs (Fexofenadine hcl) .... Two times a day 5)  Calcium 600 1500 Mg Tabs (Calcium carbonate) .... Two times a day 6)  Bl Vitamin C 500 Mg Tabs (Ascorbic acid) .... Once daily 7)  Multivitamins Tabs (Multiple vitamin) .... Take 1 tablet by mouth once a day 8)  Ocuvite Preservision Tabs (Multiple vitamins-minerals) .Marland KitchenMarland KitchenMarland Kitchen  Two times a day 9)  Celebrex 200 Mg Caps (Celecoxib) .[name].. 1 by mouth two times a day pc as needed for arthritis 10)  Fish Oil 1200 Mg Caps (Omega-3 fatty acids) .[name].. 1 cap two times a day

## 2010-03-16 NOTE — Assessment & Plan Note (Signed)
PCP:  Naseem Adler   History of Present Illness: patient presents acutely for increased urinary frequency, dysuria, no fever no chills. She has had no nausea or vomiting. She did have colonoscopy yesterday - several polyps removed.    Updated Prior Medication List: ENALAPRIL MALEATE 10 MG  TABS (ENALAPRIL MALEATE) once daily LIPITOR 20 MG  TABS (ATORVASTATIN CALCIUM) q PM ALLOPURINOL 100 MG  TABS (ALLOPURINOL) once daily FEXOFENADINE HCL 60 MG  TABS (FEXOFENADINE HCL) two times a day CALCIUM 600 1500 MG  TABS (CALCIUM CARBONATE) two times a day BL VITAMIN C 500 MG  TABS (ASCORBIC ACID) once daily * MULTIVITAMIN once daily OCUVITE PRESERVISION   TABS (MULTIPLE VITAMINS-MINERALS) two times a day VITAMIN D 1000 UNIT  TABS (CHOLECALCIFEROL) once daily BISACODYL EC 5 MG TBEC (BISACODYL) Day before procedure take 2 at 3pm and 2 at 8pm. METOCLOPRAMIDE HCL 10 MG  TABS (METOCLOPRAMIDE HCL) As per prep instructions. MIRALAX   POWD (POLYETHYLENE GLYCOL 3350) As per prep  instructions.  Current Allergies: PENICILLIN ASA  Past Medical History:    Reviewed history from 01/16/2007 and no changes required:       Allergic rhinitis       Gout       Hyperlipidemia       Hypertension                            Physician Roster:              Maxwell Marion  Past Surgical History:    Reviewed history from 01/16/2007 and no changes required:       Lumpectomy-right '86       Tonsillectomy-remote              G6P3     Review of Systems  The patient denies anorexia, fever, chest pain, syncope, dyspnea on exertion, and abdominal pain.     Physical Exam  General:     Well-developed,well-nourished,in no acute distress; alert,appropriate and cooperative throughout examination Abdomen:     no tenderness to palpation at the abdomen at the level of the umbilicus. No flank tenderness to percussion.     Impression & Recommendations:  Problem # 1:  UTI (ICD-599.0) Positive sympotms; dip U/A  markedly positive.  Plan: cipro 250mg  two times a day x 5 days. Her updated medication list for this problem includes:    Ciprofloxacin Hcl 250 Mg Tabs (Ciprofloxacin hcl) .[name]... 1 by mouth two times a day x 5 days   Complete Medication List: 1)  Enalapril Maleate 10 Mg Tabs (Enalapril maleate) .... Once daily 2)  Lipitor 20 Mg Tabs (Atorvastatin calcium) .... Q pm 3)  Allopurinol 100 Mg Tabs (Allopurinol) .... Once daily 4)  Fexofenadine Hcl 60 Mg Tabs (Fexofenadine hcl) .... Two times a day 5)  Calcium 600 1500 Mg Tabs (Calcium carbonate) .... Two times a day 6)  Bl Vitamin C 500 Mg Tabs (Ascorbic acid) .... Once daily 7)  Multivitamin  .... Once daily 8)  Ocuvite Preservision Tabs (Multiple vitamins-minerals) .... Two times a day 9)  Vitamin D 1000 Unit Tabs (Cholecalciferol) .... Once daily 10)  Bisacodyl Ec 5 Mg Tbec (Bisacodyl) .... Day before procedure take 2 at 3pm and 2 at 8pm. 11)  Metoclopramide Hcl 10 Mg Tabs (Metoclopramide hcl) .... As per prep instructions. 12)  Miralax Powd (Polyethylene glycol 3350) .... As per prep  instructions. 13)  Ciprofloxacin Hcl 250 Mg Tabs (  Ciprofloxacin hcl) .[name].. 1 by mouth two times a day x 5 days    Prescriptions: CIPROFLOXACIN HCL 250 MG TABS (CIPROFLOXACIN HCL) 1 by mouth two times a day x 5 days  #10 x 0   Entered and Authorized by:   Jacques Navy MD   Signed by:   Jacques Navy MD on 11/13/2007   Method used:   Electronically to        [facility] [phone]* (retail)       [address]       [place], [place]         Ph: [phone]       Fax: [phone]   RxID:   [id number]  ]

## 2010-03-16 NOTE — Miscellaneous (Signed)
Summary: BONE DENSITY UPDATE  Clinical Lists Changes  Observations: Added new observation of BONE DENSITY: DONE (01/04/2007 9:55)       Preventive Care Screening  Bone Density:    Date:  01/04/2007    Results:  DONE

## 2010-03-16 NOTE — Progress Notes (Signed)
Summary: Stool culture  Phone Note Call from Patient Call back at Home Phone [phone]   Caller: Patient Summary of Call: pt called in and wants to know did you have a chance to look at the results for her stool culture and are you still going to give her a referral to gastro Initial call taken by: Windell Norfolk,  January 04, 2008 8:45 AM  Follow-up for Phone Call        please call patient. The stool specimen was negative for C. Diff. It is ok to use OTC immodium for the idarrhea.  If it continues she wil need to submit another specimen. The rule out to be certain in the face of continued diarrhea requires 2 ormore negative stool specimens.  Additional Follow-up for Phone Call Additional follow up Details #1::        Pt informed  Additional Follow-up by: Lamar Sprinkles,  January 04, 2008 4:14 PM

## 2010-03-16 NOTE — Assessment & Plan Note (Signed)
Summary: post hospital [place]/per flag/cd   Vital Signs:  Patient profile:   77 year old female Height:      62 inches Weight:      183 pounds BMI:     33.59 O2 Sat:      98 % on Room air Temp:     98.7 degrees F oral Pulse rate:   63 / minute BP sitting:   132 / 70  (left arm) Cuff size:   regular  Vitals Entered By: Bill Salinas CMA (November 17, 2009 11:30 AM)  O2 Flow:  Room air CC: hosp f/u    Primary Care Provider:  Illene Regulus, MD  CC:  hosp f/u .  History of Present Illness: Terah recently had a brief episode of expressive aphasia lasting less than an hour. she was admtted to [facility] and thoroughly evaluated (records reviewed): nl CT brain, nl MRI and MRA brain, nl 2 D echo. Carotid Doppler with mild amount of plague at the bifurcation but ICA's normal. Labs were all fine. She had no recurrent episodes. She was discharged home on increased dose of lipitor to 40mg , amlodipine was added to her antihypertensive regimen and she was put on Plavix.    Since d/c home she has been doing fine. She takes this event as a "warning shot across the bow" and is committed to continued risk reduction. Her previous labs were reviewed and a year ago her lipids were better, BP was better and weight was down. She will continue present meds but increase dietary vigilence and exercise.   Current Medications (verified): 1)  Enalapril Maleate 10 Mg  Tabs (Enalapril Maleate) .[name].. 1 Tab Two Times A Day 2)  Lipitor 20 Mg  Tabs (Atorvastatin Calcium) .... Q Pm 3)  Allopurinol 100 Mg  Tabs (Allopurinol) .... Once Daily 4)  Fexofenadine Hcl 60 Mg  Tabs (Fexofenadine Hcl) .... Two Times A Day 5)  Calcium 600 1500 Mg  Tabs (Calcium Carbonate) .... Two Times A Day 6)  Bl Vitamin C 500 Mg  Tabs (Ascorbic Acid) .... Once Daily 7)  Multivitamins  Tabs (Multiple Vitamin) .... Take 1 Tablet By Mouth Once A Day 8)  Ocuvite Preservision   Tabs (Multiple Vitamins-Minerals) .... Two Times A Day 9)  Celebrex  200 Mg Caps (Celecoxib) .[name].. 1 By Mouth Two Times A Day Pc As Needed For Arthritis 10)  Fish Oil 1200 Mg Caps (Omega-3 Fatty Acids) .[name].. 1 Cap Two Times A Day 11)  Amlodipine Besylate 5 Mg Tabs (Amlodipine Besylate) .[name].. 1 Tab Once Daily 12)  Plavix 75 Mg Tabs (Clopidogrel Bisulfate) .[name].. 1 Tab Once Daily 13)  Hydrochlorothiazide 12.5 Mg Caps (Hydrochlorothiazide) .[name].. 1 Cap Once Daily  Allergies (verified): 1)  Penicillin 2)  Asa  Past History:  Past Medical History: Last updated: 04/24/2008 Allergic rhinitis Current Problems:  ANAL PRURITUS (ICD-698.0) RECTAL BLEEDING (ICD-569.3) COLONIC POLYPS, HYPERPLASTIC, HX OF (ICD-V12.72) DIVERTICULOSIS, COLON (ICD-562.10) CLOSTRIDIUM DIFFICILE COLITIS, HX OF (ICD-V12.79) COUNSELING MARITAL&PARTNER PROBLEMS UNSPECIFIED (ICD-V61.10) UTI (ICD-599.0) PSYCHOSOCIAL PROBLEM (ICD-V62.9) BACK PAIN, ACUTE (ICD-724.5) HYPERTENSION (ICD-401.9) HYPERLIPIDEMIA (ICD-272.4) GOUT (ICD-274.9) ALLERGIC RHINITIS (ICD-477.9)  Physician Roster:        Maxwell Marion  Past Surgical History: Last updated: Jan 31, 2007 Lumpectomy-right '86 Tonsillectomy-remote  G6P3  Family History: Last updated: 01-31-07 father- died 28, Colon Cancer, HTN, Lipids, CAD, PUD mother- died 9, CHF, CAD, Breast Cancer bilateral, HTN, Lipids  Social History: Last updated: 01/23/2009 married 1955-widowed '10 3 sons: '57, '59, 62 6 grandchildren retired I-ADLs  SO had  alzheimer's and blindness and hallucinosis/psychosis requiring 24/7 care. Passed away 07/10/2022'10 Her son is living at home with her for now (June '10)-moved to [place] (July '10) End of life Care: cardiac resuscitation - yes, short-term mechanical ventilation - yes; no long term heroic support. End of LIfe: no extra-ordinary measures: Wants Cardiac Resuscitaiton, mechanical ventilation if needed. Does not want to  be kept in a persistent vegative state or long-term artificial life support.  Review  of Systems  The patient denies anorexia, fever, weight loss, vision loss, decreased hearing, chest pain, dyspnea on exertion, peripheral edema, headaches, abdominal pain, severe indigestion/heartburn, muscle weakness, difficulty walking, depression, and abnormal bleeding.    Physical Exam  General:  overweight white female who appears healthy Head:  normocephalic and atraumatic.   Eyes:  vision grossly intact, pupils equal, pupils round, and corneas and lenses clear.   Neck:  supple, full ROM, and no thyromegaly.   Lungs:  Normal respiratory effort, chest expands symmetrically. Lungs are clear to auscultation, no crackles or wheezes. Heart:  Normal rate and regular rhythm. S1 and S2 normal without gallop, murmur, click, rub or other extra sounds. Abdomen:  soft, non-tender, and normal bowel sounds.   Msk:  normal ROM, no joint tenderness, no joint swelling, no joint warmth, and no redness over joints.   Pulses:  2+ radial Neurologic:  alert & oriented X3, cranial nerves II-XII intact, strength normal in all extremities, gait normal, and DTRs symmetrical and normal.   Skin:  turgor normal, color normal, and no rashes.   Cervical Nodes:  no anterior cervical adenopathy and no posterior cervical adenopathy.   Psych:  Oriented X3, memory intact for recent and remote, normally interactive, and good eye contact.     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  Plan - long term antiplatelet therapy with plavix.  Her updated medication list for this problem includes:    Plavix 75 Mg Tabs (Clopidogrel bisulfate) .[name]... 1 tab once daily  Problem # 2:  HYPERTENSION (ICD-401.9)  Her updated medication list for this problem includes:    Enalapril Maleate 10 Mg Tabs (Enalapril maleate) .[name]... 1 tab two times a day    Amlodipine Besylate 5 Mg Tabs (Amlodipine besylate) .[name]... 1  tab once daily    Hydrochlorothiazide 12.5 Mg Caps (Hydrochlorothiazide) .[name]... 1 cap once daily  BP today: 132/70 Prior BP: 122/70 (07/27/2009)  OK control. will continue present regimen with recheck in one month. If controlled will change to tribenzor.  Problem # 3:  HYPERLIPIDEMIA (ICD-272.4) In hospital lipid panel with elevated LDL.   Plan - continue increased dose of lipitor at 40mg  qPM           lab in 3-4 weeks: goal is LDL 80 or less  Her updated medication list for this problem includes:    Lipitor 40 Mg Tabs (Atorvastatin calcium) .[name]... 1 once daily  Complete Medication List: 1)  Enalapril Maleate 10 Mg Tabs (Enalapril maleate) .[name].. 1 tab two times a day 2)  Lipitor 40 Mg Tabs (Atorvastatin calcium) .[name].. 1 once daily 3)  Allopurinol 100 Mg Tabs (Allopurinol) .... Once daily 4)  Fexofenadine Hcl 60 Mg Tabs (Fexofenadine hcl) .... Two times a day 5)  Calcium 600 1500 Mg Tabs (Calcium carbonate) .... Two times a day 6)  Bl Vitamin C 500 Mg Tabs (Ascorbic acid) .... Once daily  7)  Multivitamins Tabs (Multiple vitamin) .... Take 1 tablet by mouth once a day 8)  Ocuvite Preservision Tabs (Multiple vitamins-minerals) .... Two times a day 9)  Celebrex 200 Mg Caps (Celecoxib) .[name].. 1 by mouth two times a day pc as needed for arthritis 10)  Fish Oil 1200 Mg Caps (Omega-3 fatty acids) .[name].. 1 cap two times a day 11)  Amlodipine Besylate 5 Mg Tabs (Amlodipine besylate) .[name].. 1 tab once daily 12)  Plavix 75 Mg Tabs (Clopidogrel bisulfate) .[name].. 1 tab once daily 13)  Hydrochlorothiazide 12.5 Mg Caps (Hydrochlorothiazide) .[name].. 1 cap once daily

## 2010-03-16 NOTE — Assessment & Plan Note (Signed)
Summary: CPX YEARLY / $50 / CD   Vital Signs:  Patient Profile:   77 Years Old Female Height:     63 inches Weight:      175 pounds BMI:     31.11 Temp:     98.7 degrees F oral Pulse rate:   80 / minute BP sitting:   122 / 68  (left arm) Cuff size:   regular  Vitals Entered By: Zackery Barefoot CMA (January 23, 2008 10:21 AM)                 PCP:  Zohal Reny  Chief Complaint:  CPX/productive cough x 1 week.  History of Present Illness: Presents for general medical follow-up. Has a cold and cough, no fever, yellowish phlegm that isn't purulent. She is leary of antibiotics.  Patient is otherwise doing well with no new medical problems or complaints.    Prior Medications Reviewed Using: Patient Recall  Updated Prior Medication List: ENALAPRIL MALEATE 10 MG  TABS (ENALAPRIL MALEATE) once daily LIPITOR 20 MG  TABS (ATORVASTATIN CALCIUM) q PM ALLOPURINOL 100 MG  TABS (ALLOPURINOL) once daily FEXOFENADINE HCL 60 MG  TABS (FEXOFENADINE HCL) two times a day CALCIUM 600 1500 MG  TABS (CALCIUM CARBONATE) two times a day BL VITAMIN C 500 MG  TABS (ASCORBIC ACID) once daily * MULTIVITAMIN once daily OCUVITE PRESERVISION   TABS (MULTIPLE VITAMINS-MINERALS) two times a day VITAMIN D 1000 UNIT  TABS (CHOLECALCIFEROL) once daily  Current Allergies (reviewed today): PENICILLIN ASA  Past Medical History:    Reviewed history from 01/16/2007 and no changes required:       Allergic rhinitis       Gout       Hyperlipidemia       Hypertension                            Physician Roster:              Maxwell Marion  Past Surgical History:    Reviewed history from 01/16/2007 and no changes required:       Lumpectomy-right '86       Tonsillectomy-remote              G6P3   Family History:    Reviewed history from 01/16/2007 and no changes required:       father- died 15, Colon Cancer, HTN, Lipids, CAD, PUD       mother- died 15, CHF, CAD, Breast Cancer bilateral, HTN,  Lipids  Social History:    Reviewed history from 10/12/2007 and no changes required:       married 1955       3 sons: '57, '59, 62       6 grandchildren       retired       Lives with SO: I-ADLs       SO with alzheimer's and blindness and hallucinosis/psychosis requiring 24/7 care.       End of LIfe: no extra-ordinary measures: Wants Cardiac Resuscitaiton, mechanical ventilation if needed. Does not want to  be kept in a persistent vegative state or long-term artificial life support.   Risk Factors:  Mammogram History:     Date of Last Mammogram:  01/08/2008    Results:  Normal Bilateral    Review of Systems       The patient complains of weight loss.  The patient denies anorexia, fever, weight gain, decreased  hearing, chest pain, syncope, dyspnea on exertion, peripheral edema, abdominal pain, severe indigestion/heartburn, incontinence, muscle weakness, difficulty walking, and depression.         20lbs weight loss: 10 with diarrhea and 10 with effort   Physical Exam  General:     Overweight white female in No acute distress Head:     Normocephalic and atraumatic without obvious abnormalities. No apparent alopecia or balding. Eyes:     No corneal or conjunctival inflammation noted. EOMI. Perrla. Funduscopic exam benign, without hemorrhages, exudates or papilledema. Vision grossly normal. Ears:     R ear normal, L ear normal, and no external deformities.   Nose:     no external deformity.   Mouth:     Oral mucosa and oropharynx without lesions or exudates.  Teeth in good repair. Neck:     No deformities, masses, or tenderness noted.no thyromegaly and no carotid bruits.   Chest Wall:     No deformities, masses, or tenderness noted. Breasts:     No mass, nodules, thickening, tenderness, bulging, retraction, inflamation, nipple discharge or skin changes noted.   Lungs:     Normal respiratory effort, chest expands symmetrically. Lungs are clear to auscultation, no crackles or  wheezes. Heart:     Normal rate and regular rhythm. S1 and S2 normal without gallop, murmur, click, rub or other extra sounds. Abdomen:     soft, non-tender, normal bowel sounds, no distention, no guarding, and no hepatomegaly.   Msk:     normal ROM, no joint tenderness, no joint swelling, no joint warmth, no redness over joints, no joint deformities, and no joint instability.   Pulses:     2+ radial and DP pulses Extremities:     No clubbing, cyanosis, edema, or deformity noted with normal full range of motion of all joints.   Neurologic:     alert & oriented X3, cranial nerves II-XII intact, strength normal in all extremities, gait normal, and DTRs symmetrical and normal.   Skin:     Intact without suspicious lesions or rashes Cervical Nodes:     No lymphadenopathy noted Axillary Nodes:     No palpable lymphadenopathy Psych:     Oriented X3, memory intact for recent and remote, normally interactive, and good eye contact.  A bit tearful when discussing home situation and need for planning given her husband's deteriorating condition.    Impression & Recommendations:  Problem # 1:  HYPERTENSION (ICD-401.9)  Her updated medication list for this problem includes:    Enalapril Maleate 10 Mg Tabs (Enalapril maleate) ..... Once daily  Orders: TLB-BMP (Basic Metabolic Panel-BMET) (80048-METABOL)  BP today: 122/68 Prior BP: 134/84 (10/12/2007)  addendum: labs normal  Good control.  Labs Reviewed: Creat: 0.8 (01/16/2007) Chol: 173 (01/16/2007)   HDL: 55.3 (01/16/2007)   LDL: 80 (01/16/2007)   TG: 189 (01/16/2007)   Problem # 2:  HYPERLIPIDEMIA (ICD-272.4) Has been doing well. Due for follow-up lab with recommendations to follow. Her updated medication list for this problem includes:    Lipitor 20 Mg Tabs (Atorvastatin calcium) ..... Q pm  Orders: TLB-Lipid Panel (80061-LIPID) TLB-Hepatic/Liver Function Pnl (80076-HEPATIC)  Addendum: HDL good at 44, LDL GREAT at  68  Problem # 3:  GOUT (ICD-274.9) No recent flares. Tolerating allopurinol.  Plan: uric acid level.  Her updated medication list for this problem includes:    Allopurinol 100 Mg Tabs (Allopurinol) ..... Once daily  Orders: TLB-CBC Platelet - w/Differential (85025-CBCD) TLB-Uric Acid, Blood (84550-URIC)  Addendum: Uric  acid 4.8 - normal range   Problem # 4:  Preventive Health Care (ICD-V70.0) Normal breast exam and general physical exam. She is up to date with mammograms and colorectal cancer screening.  In summary: a delightful patient who appears medically stable. She does acknowledge the need for long range plannng and going for thinking about the issue to taking action. She will return as needed.   For her URI - usual supportive care. There is no indication for antibiotics at this time.  Complete Medication List: 1)  Enalapril Maleate 10 Mg Tabs (Enalapril maleate) .... Once daily 2)  Lipitor 20 Mg Tabs (Atorvastatin calcium) .... Q pm 3)  Allopurinol 100 Mg Tabs (Allopurinol) .... Once daily 4)  Fexofenadine Hcl 60 Mg Tabs (Fexofenadine hcl) .... Two times a day 5)  Calcium 600 1500 Mg Tabs (Calcium carbonate) .... Two times a day 6)  Bl Vitamin C 500 Mg Tabs (Ascorbic acid) .... Once daily 7)  Multivitamin  .... Once daily 8)  Ocuvite Preservision Tabs (Multiple vitamins-minerals) .... Two times a day 9)  Vitamin D 1000 Unit Tabs (Cholecalciferol) .... Once daily 10)  Promethazine-codeine 6.25-10 Mg/40ml Syrp (Promethazine-codeine) .[name].. 1 tsp q 6 as needed cough  Patient: Gina [name] Note: All result statuses are Final unless otherwise noted.  Tests: (1) LIPID PROFILE (LIPID)   CHOLESTEROL               145 mg/dL                   1-610       ATP III Classification:             < 200       mg/dL      Desireable            200 - 239    mg/dL      Borderline High            > = 240      mg/dL      High   TRIGLYCERIDES        [H]  167 mg/dL                    9-604        Normal:  < 150 mg/dL        Borderline High:  150 - 199 mg/dL   HDL                       54.0 mg/dL                  >98.1   VLDL CHOLESTEROL          33 mg/dl                    1-91   LDL CHOLESTEROL           68 mg/dl                    4-78  CHOL/HDL Ratio: CHD Risk                             3.3 CALC  Tests: (2) CBC WITH DIFF (CBCD)   WHITE CELL COUNT          8.0 K/uL  4.5-10.5   RED CELL COUNT       [L]  3.75 Mil/uL                 3.87-5.11   HEMOGLOBIN                12.4 g/dL                   04.5-40.9   HEMATOCRIT                36.6 %                      36.0-46.0   MCV                       97.6 fl                     78.0-100.0   MCHC                      33.9 g/dL                   81.1-91.4   RDW                       13.9 %                      11.5-14.6   PLATELET COUNT            160 K/uL                    150-400   NEUTROPHIL %              65.0 %                      43.0-77.0   LYMPHOCYTE %              22.2 %                      12.0-46.0   MONOCYTE %                11.4 %                      3.0-12.0   EOSINOPHILS %             1.1 %                       0.0-5.0   BASOPHILS %               0.3 %                       0.0-3.0  NEUTROPHILS, ABSOLUTE                             5.2 K/uL                    1.4-7.7   MONOCYTES, ABSOLUTE       0.9 K/uL                    0.1-1.0  EOSINOPHILS, ABSOLUTE  0.1 K/uL                    0.0-0.7   BASOPHILS, ABSOLUTE       0.0 K/uL                    0.0-0.1  Tests: (3) BASIC METABOLIC PANEL (METABOL)   SODIUM                    143 mEq/L                   135-145   POTASSIUM                 4.5 mEq/L                   3.5-5.1   CHLORIDE                  104 mEq/L                   96-112   CARBON DIOXIDE       [H]  36 mEq/L                    19-32   GLUCOSE                   94 mg/dL                    16-10   BUN                       23 mg/dL                     9-60   CREATININE                0.9 mg/dL                   4.5-4.0   CALCIUM                   10.3 mg/dL                  9.8-11.9  GFR (AFRICAN AMERICAN)                             79 mL/min  GFR (NON-AFRICAN AMERICAN)                             65 mL/min  Tests: (4) HEPATIC FUNCTION PANEL (HEPATIC)   TOTAL BILIRUBIN           0.8 mg/dL                   1.4-7.8   DIRECT BILIRUBIN          0.1 mg/dL                   2.9-5.6   ALKALINE PHOSPHATASE      64 U/L                      39-117   SGOT (AST)                26 U/L  0-37   SGPT (ALT)                24 U/L                      0-35   TOTAL PROTEIN             7.1 g/dL                    4.0-9.8   ALBUMIN                   3.5 g/dL                    1.1-9.1  Tests: (5) URIC ACID (URIC)   URIC ACID                 4.8 mg/dL                   4.7-8.2   Prescriptions: PROMETHAZINE-CODEINE 6.25-10 MG/5ML SYRP (PROMETHAZINE-CODEINE) 1 tsp q 6 as needed cough  #8 oz x 1   Entered and Authorized by:   Jacques Navy MD   Signed by:   Jacques Navy MD on 01/23/2008   Method used:   Print then Give to Patient   RxID:   [id number] FEXOFENADINE HCL 60 MG  TABS (FEXOFENADINE HCL) two times a day  #180 x 3   Entered and Authorized by:   Jacques Navy MD   Signed by:   Jacques Navy MD on 01/23/2008   Method used:   Electronically to        [facility]* (mail-order)             ,          Ph: [phone]       Fax: [phone]   RxID:   [id number] ALLOPURINOL 100 MG  TABS (ALLOPURINOL) once daily  #90 x 3   Entered and Authorized by:   Jacques Navy MD   Signed by:   Jacques Navy MD on 01/23/2008   Method used:   Electronically to        [facility]* (mail-order)             ,          Ph: [phone]       Fax: [phone]   RxID:   [id number] LIPITOR 20 MG  TABS (ATORVASTATIN CALCIUM) q PM  #90 x 3   Entered and Authorized by:   Jacques Navy MD    Signed by:   Jacques Navy MD on 01/23/2008   Method used:   Electronically to        [facility]* (mail-order)             ,          Ph: [phone]       Fax: [phone]   RxID:   [id number] ENALAPRIL MALEATE 10 MG  TABS (ENALAPRIL MALEATE) once daily  #90 x 3   Entered and Authorized by:   Jacques Navy MD   Signed by:   Jacques Navy MD on 01/23/2008   Method used:   Electronically to        [facility]* (mail-order)             ,          Ph: [phone]  Fax: [phone]   RxID:   [id number] ENALAPRIL MALEATE 10 MG  TABS (ENALAPRIL MALEATE) once daily  #30 x 1   Entered by:   Zackery Barefoot CMA   Authorized by:   Jacques Navy MD   Signed by:   Zackery Barefoot CMA on 01/23/2008   Method used:   Electronically to        [facility] [phone]* (retail)       [address]       [place], [place]  [address]       Ph: [phone]       Fax: [phone]   RxID:   [id number]  ]

## 2010-03-18 ENCOUNTER — Encounter: Payer: Self-pay | Admitting: Internal Medicine

## 2010-03-18 NOTE — Progress Notes (Signed)
Summary: Lovenox  Phone Note Other Incoming   Caller: Pt Summary of Call: Pt is sch for spinal injection on Feb 25, 2010 she is stopping plavix tomm. and she is needing prescription for her lovenox injection and needing them sent to [facility] on [address] Initial call taken by: Ami Bullins CMA,  February 17, 2010 3:54 PM  Follow-up for Phone Call         plan - stop plavix 8 days prior to surgery. Days 7-8 will need lovenox 40mg  subcutaneously once daily = 2 prefilled syringes - 40mg  each. Please call in Follow-up by: Jacques Navy MD,  February 18, 2010 12:58 PM  Additional Follow-up for Phone Call Additional follow up Details #1::        pt informed  Additional Follow-up by: Lanier Prude, [facility]),  February 18, 2010 4:17 PM    New/Updated Medications: LOVENOX 40 MG/0.4ML SOLN (ENOXAPARIN SODIUM) use as directed by physician Prescriptions: LOVENOX 40 MG/0.4ML SOLN (ENOXAPARIN SODIUM) use as directed by physician  #2 x 0   Entered by:   Lanier Prude, CMA(AAMA)   Authorized by:   Jacques Navy MD   Signed by:   Lanier Prude, [facility]) on 02/18/2010   Method used:   Electronically to        [facility] [phone]* (retail)       [address]       [place], [place]  [address]       Ph: [phone]       Fax: [phone]   RxID:   [id number]

## 2010-03-18 NOTE — Assessment & Plan Note (Signed)
Summary: CPX / MEDICARE / LABS AFTER/ NWS  #   Vital Signs:  Patient profile:   77 year old female Height:      62 inches Weight:      179 pounds BMI:     32.86 O2 Sat:      97 % on Room air Temp:     98.1 degrees F oral Pulse rate:   72 / minute BP sitting:   112 / 62  (left arm) Cuff size:   regular  Vitals Entered By: Bill Salinas CMA (February 04, 2010 11:10 AM)  O2 Flow:  Room air  Primary Care Provider:  Illene Regulus, MD   History of Present Illness: Patient presents for a medicare wellness exam. She is currently doing ok except for back pain due to a spinal cyst which hasn't been treated, e.g. ESI, due to being on plavix.   In the last year she has had a TIA with hospitalization in october.   She is 100% independent in ADLs but wisely gets some help so that she does not need to climb ladders or engage in aqctivities with high risk of fall. She has not had any falls. She has no signs or symptoms of depression. She is cognitively intact and manages all of her own business and personal affairs.   Preventive Screening-Counseling & Management  Alcohol-Tobacco     Alcohol drinks/day: 1     Alcohol type: wine     Alcohol Counseling: not indicated; use of alcohol is not excessive or problematic     Smoking Status: quit     Year Quit: 1989  Caffeine-Diet-Exercise     Caffeine use/day: 1cup      Diet Comments: heart healthy diet     Diet Counseling: not indicated; diet is assessed to be healthy     Does Patient Exercise: no     Exercise Counseling: to improve exercise regimen-will resume an exericse program after treatment for sciatica  Hep-HIV-STD-Contraception     Hepatitis Risk: no risk noted     HIV Risk: no risk noted     STD Risk: no risk noted     Dental Visit-last 6 months yes     SBE monthly: no     Sun Exposure-Excessive: no  Safety-Violence-Falls     Seat Belt Use: yes     Helmet Use: n/a     Firearms in the Home: no firearms in the home     Smoke  Detectors: yes     Violence in the Home: no risk noted     Sexual Abuse: no     Fall Risk: slight fall risk      Drug Use:  never.        Blood Transfusions:  no.    Current Medications (verified): 1)  Enalapril Maleate 10 Mg  Tabs (Enalapril Maleate) .[name].. 1 Once Daily For Blood Pressure 2)  Lipitor 40 Mg Tabs (Atorvastatin Calcium) .[name].. 1 Once Daily 3)  Allopurinol 100 Mg  Tabs (Allopurinol) .... Once Daily 4)  Loratadine Allergy Relief 10 Mg Tbdp (Loratadine) .[name].. 1 By Mouth Once Daily 5)  Calcium 600 1500 Mg  Tabs (Calcium Carbonate) .... Two Times A Day 6)  Bl Vitamin C 500 Mg  Tabs (Ascorbic Acid) .... Once Daily 7)  Multivitamins  Tabs (Multiple Vitamin) .... Take 1 Tablet By Mouth Once A Day 8)  Ocuvite Preservision   Tabs (Multiple Vitamins-Minerals) .... Two Times A Day 9)  Fish Oil 1200 Mg  Caps (Omega-3 Fatty Acids) .[name].. 1 Cap Two Times A Day 10)  Amlodipine Besylate 5 Mg Tabs (Amlodipine Besylate) .[name].. 1 Tab Once Daily 11)  Plavix 75 Mg Tabs (Clopidogrel Bisulfate) .[name].. 1 Tab Once Daily 12)  Hydrochlorothiazide 12.5 Mg Caps (Hydrochlorothiazide) .[name].. 1 Cap Once Daily 13)  Tylenol Extra Strength 500 Mg Tabs (Acetaminophen) .... 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) HIP PAIN, LEFT (ICD-719.45) COLONIC POLYPS, HYPERPLASTIC, HX OF (ICD-V12.72) DIVERTICULOSIS, COLON (ICD-562.10) CLOSTRIDIUM DIFFICILE COLITIS, HX OF (ICD-V12.79) HYPERTENSION (ICD-401.9) HYPERLIPIDEMIA (ICD-272.4) GOUT (ICD-274.9) ALLERGIC RHINITIS (ICD-477.9)    Physician Roster:        Maxwell Marion  Past Surgical History: Last updated: 02-Feb-2007 Lumpectomy-right '86 Tonsillectomy-remote  G6P3  Family History: Last updated: 02-02-2007 father- died 24, Colon Cancer, HTN, Lipids, CAD, PUD mother- died 84, CHF, CAD, Breast Cancer bilateral,  HTN, Lipids  Social History: Last updated: 01/23/2009 married 1955-widowed '10 3 sons: '57, '59, 62 6 grandchildren retired I-ADLs SO had  alzheimer's and blindness and hallucinosis/psychosis requiring 24/7 care. Passed away 07/10/2022'10 Her son is living at home with her for now (June '10)-moved to [place] (July '10) End of life Care: cardiac resuscitation - yes, short-term mechanical ventilation - yes; no long term heroic support. End of LIfe: no extra-ordinary measures: Wants Cardiac Resuscitaiton, mechanical ventilation if needed. Does not want to  be kept in a persistent vegative state or long-term artificial life support.  Social History: Caffeine use/day:  1cup  Does Patient Exercise:  no Dental Care w/in 6 mos.:  yes Sun Exposure-Excessive:  no Seat Belt Use:  yes Fall Risk:  slight fall risk Blood Transfusions:  no Hepatitis Risk:  no risk noted HIV Risk:  no risk noted STD Risk:  no risk noted Drug Use:  never  Review of Systems  The patient denies anorexia, fever, weight loss, weight gain, decreased hearing, hoarseness, syncope, dyspnea on exertion, prolonged cough, hemoptysis, severe indigestion/heartburn, muscle weakness, suspicious skin lesions, depression, abnormal bleeding, and angioedema.    Physical Exam  General:  overweight white woman in no distress but uncomfortable with standing Head:  normocephalic and atraumatic.   Eyes:  pupils equal and pupils round.  C&S clear Neck:  supple.   Lungs:  normal respiratory effort.   Heart:  normal rate and regular rhythm.   Pulses:  2+ radial Neurologic:  alert & oriented X3, cranial nerves II-XII intact, and gait normal.   Skin:  turgor normal and color normal.   Psych:  Oriented X3, memory intact for recent and remote, normally interactive, and good eye contact.     Impression & Recommendations:  Problem # 1:  UNSPECIFIED TRANSIENT CEREBRAL ISCHEMIA (ICD-435.9) Doing well. For ESI: off plavix for 7 days.  ovenox 40mg  subcutaneously once daily #6-7. Procedure day #8 and then simple restart of plavix.   Her updated medication list for this problem includes:    Plavix 75 Mg Tabs (Clopidogrel bisulfate) .[name]... 1 tab once daily  Problem # 2:  HIP PAIN, LEFT (ICD-719.45) Patient with sciatica that limits her activity. See #1 - for ESI #2 when she can get it scheduled  The following medications were removed from the medication list:    Celebrex 200 Mg Caps (Celecoxib) .[name]... 1 by mouth two times a day pc as needed for arthritis Her updated medication list for this problem  includes:    Tylenol Extra Strength 500 Mg Tabs (Acetaminophen) ..... Up to 4 a day for back pain  Problem # 3:  PREVENTIVE HEALTH CARE (ICD-V70.0)  patient's interval history as noted with hospitalization for TIA; sciatica with spinal cyst. Vitals and heart and lungs OK. No new lab. Recent lab in October with good control of lipids. Current with mammography. Current with colorectal cancer screening with last study Sept '09. Immunizations: tetnus Nov '07; pneumonia vaccine Nov '11; shingles vaccine Nov '07.  In summary - a very nice woman who is medically stable. Counseled to resume exercise when her siciatica is improved.  Advised on risk reduction and safety. Instructed re: managment of anti-coagulation for ESI.  She is asked to return as needed of in 6 months for routine follow-up.  Orders: Medicare -1st Annual Wellness Visit [phone])  Complete Medication List: 1)  Enalapril Maleate 10 Mg Tabs (Enalapril maleate) .[name].. 1 once daily for blood pressure 2)  Lipitor 40 Mg Tabs (Atorvastatin calcium) .[name].. 1 once daily 3)  Allopurinol 100 Mg Tabs (Allopurinol) .... Once daily 4)  Loratadine Allergy Relief 10 Mg Tbdp (Loratadine) .[name].. 1 by mouth once daily 5)  Calcium 600 1500 Mg Tabs (Calcium carbonate) .... Two times a day 6)  Bl Vitamin C 500 Mg Tabs (Ascorbic acid) .... Once daily 7)  Multivitamins Tabs (Multiple vitamin) ....  Take 1 tablet by mouth once a day 8)  Ocuvite Preservision Tabs (Multiple vitamins-minerals) .... Two times a day 9)  Fish Oil 1200 Mg Caps (Omega-3 fatty acids) .[name].. 1 cap two times a day 10)  Amlodipine Besylate 5 Mg Tabs (Amlodipine besylate) .[name].. 1 tab once daily 11)  Plavix 75 Mg Tabs (Clopidogrel bisulfate) .[name].. 1 tab once daily 12)  Hydrochlorothiazide 12.5 Mg Caps (Hydrochlorothiazide) .[name].. 1 cap once daily 13)  Tylenol Extra Strength 500 Mg Tabs (Acetaminophen) .... Up to 4 a day for back pain   Orders Added: 1)  Medicare -1st Annual Wellness Visit [G0438]     Preventive Care Screening  Mammogram:    Date:  01/20/2010    Results:  normal

## 2010-04-07 NOTE — Letter (Signed)
Summary: [facility]   Imported By: Sherian Rein 03/31/2010 07:44:25  _____________________________________________________________________  External Attachment:    Type:   Image     Comment:   External Document

## 2010-04-08 ENCOUNTER — Ambulatory Visit (INDEPENDENT_AMBULATORY_CARE_PROVIDER_SITE_OTHER): Payer: Medicare Other | Admitting: Internal Medicine

## 2010-04-08 ENCOUNTER — Encounter: Payer: Self-pay | Admitting: Internal Medicine

## 2010-04-08 DIAGNOSIS — G459 Transient cerebral ischemic attack, unspecified: Secondary | ICD-10-CM

## 2010-04-08 DIAGNOSIS — I10 Essential (primary) hypertension: Secondary | ICD-10-CM

## 2010-04-13 NOTE — Assessment & Plan Note (Signed)
Summary: FOLLOW UP/NWS  #   Vital Signs:  Patient profile:   77 year old female Height:      62 inches Weight:      186 pounds BMI:     34.14 O2 Sat:      95 % on Room air Temp:     99.1 degrees F oral Pulse rate:   68 / minute BP sitting:   136 / 78  (left arm) Cuff size:   regular  Vitals Entered By: Bill Salinas CMA (April 08, 2010 10:47 AM)  O2 Flow:  Room air CC: follow-up visit/ ab   Primary Care Provider:  Illene Regulus, MD  CC:  follow-up visit/ ab.  History of Present Illness: Gina [name] is here to discuss up-coming surgery for an epidural cyst. She is tentatively scheduled for april. she has had a TIA and is on plavix. She has a high level of concern about being off Plavix for an extended period of time. We discussed doing  a lovenox bridge starting after being off plavix for 3 days and continuing to within 24 hours of surgery ( half life of lovenox is 4.5 - 7 hrs). She reports that Dr. Newell Coral is opposed to using Lovenox. Her stroke risk is small but not neglible.  Her BP today is well controlled. will be happy to assist with her management during the perioperative period.   Current Medications (verified): 1)  Enalapril Maleate 10 Mg  Tabs (Enalapril Maleate) .[name].. 1 Once Daily For Blood Pressure 2)  Lipitor 40 Mg Tabs (Atorvastatin Calcium) .[name].. 1 Once Daily 3)  Allopurinol 100 Mg  Tabs (Allopurinol) .... Once Daily 4)  Loratadine Allergy Relief 10 Mg Tbdp (Loratadine) .[name].. 1 By Mouth Once Daily 5)  Calcium 600 1500 Mg  Tabs (Calcium Carbonate) .... Two Times A Day 6)  Bl Vitamin C 500 Mg  Tabs (Ascorbic Acid) .... Once Daily 7)  Multivitamins  Tabs (Multiple Vitamin) .... Take 1 Tablet By Mouth Once A Day 8)  Ocuvite Preservision   Tabs (Multiple Vitamins-Minerals) .... Two Times A Day 9)  Fish Oil 1200 Mg Caps (Omega-3 Fatty Acids) .[name].. 1 Cap Two Times A Day 10)  Amlodipine Besylate 5 Mg Tabs (Amlodipine Besylate) .[name].. 1 Tab Once Daily 11)  Plavix 75 Mg  Tabs (Clopidogrel Bisulfate) .[name].. 1 Tab Once Daily 12)  Hydrochlorothiazide 12.5 Mg Caps (Hydrochlorothiazide) .[name].. 1 Cap Once Daily 13)  Tylenol Extra Strength 500 Mg Tabs (Acetaminophen) .... Up To 6 A Day For Back Pain 14)  Lovenox 40 Mg/0.51ml Soln (Enoxaparin Sodium) .... 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) HIP PAIN, LEFT (ICD-719.45) COLONIC POLYPS, HYPERPLASTIC, HX OF (ICD-V12.72) DIVERTICULOSIS, COLON (ICD-562.10) CLOSTRIDIUM DIFFICILE COLITIS, HX OF (ICD-V12.79) HYPERTENSION (ICD-401.9) HYPERLIPIDEMIA (ICD-272.4) GOUT (ICD-274.9) ALLERGIC RHINITIS (ICD-477.9)    Physician Roster:        Maxwell Marion  Past Surgical History: Last updated: 02/06/07 Lumpectomy-right '86 Tonsillectomy-remote  G6P3  Family History: Last updated: 02-06-07 father- died 22, Colon Cancer, HTN, Lipids, CAD, PUD mother- died 51, CHF, CAD, Breast Cancer bilateral, HTN, Lipids  Social History: Last updated: 01/23/2009 married 1955-widowed '10 3 sons: '57, '59, 62 6 grandchildren retired I-ADLs SO had  alzheimer's and blindness and hallucinosis/psychosis requiring 24/7 care. Passed away 07-14-22'10 Her son is living at home with her for now (June '10)-moved to [place] (July '  10) End of life Care: cardiac resuscitation - yes, short-term mechanical ventilation - yes; no long term heroic support. End of LIfe: no extra-ordinary measures: Wants Cardiac Resuscitaiton, mechanical ventilation if needed. Does not want to  be kept in a persistent vegative state or long-term artificial life support.  Review of Systems  The patient denies anorexia, fever, weight loss, weight gain, chest pain, syncope, dyspnea on exertion, abdominal pain, severe indigestion/heartburn, difficulty walking, and abnormal bleeding.    Physical  Exam  General:  alert, well-developed, and well-nourished.   Head:  normocephalic, atraumatic, and no abnormalities observed.   Eyes:  vision grossly intact, pupils equal, and pupils round.   Lungs:  normal respiratory effort and normal breath sounds.   Heart:  normal rate.   Neurologic:  alert & oriented X3.   Skin:  turgor normal and color normal.   Psych:  Oriented X3, memory intact for recent and remote, and normally interactive.     Impression & Recommendations:  Problem # 1:  UNSPECIFIED TRANSIENT CEREBRAL ISCHEMIA (ICD-435.9) Up coming surgery - will recommend lovenox bridging if Dr. Newell Coral is OK with this.  Her updated medication list for this problem includes:    Plavix 75 Mg Tabs (Clopidogrel bisulfate) .[name]... 1 tab once daily  (greater than 50% 20 min visit on education and counesling)  Problem # 2:  HYPERTENSION (ICD-401.9)  Her updated medication list for this problem includes:    Enalapril Maleate 10 Mg Tabs (Enalapril maleate) .[name]... 1 once daily for blood pressure    Amlodipine Besylate 5 Mg Tabs (Amlodipine besylate) .[name]... 1 tab once daily    Hydrochlorothiazide 12.5 Mg Caps (Hydrochlorothiazide) .[name]... 1 cap once daily  BP today: 136/78 Prior BP: 112/62 (02/04/2010)  Labs Reviewed: K+: 4.8 (12/03/2009) Creat: : 0.9 (12/03/2009)   Chol: 149 (12/03/2009)   HDL: 49.10 (12/03/2009)   LDL: 67 (12/03/2009)   TG: 165.0 (12/03/2009)  Good control.  Complete Medication List: 1)  Enalapril Maleate 10 Mg Tabs (Enalapril maleate) .[name].. 1 once daily for blood pressure 2)  Lipitor 40 Mg Tabs (Atorvastatin calcium) .[name].. 1 once daily 3)  Allopurinol 100 Mg Tabs (Allopurinol) .... Once daily 4)  Loratadine Allergy Relief 10 Mg Tbdp (Loratadine) .[name].. 1 by mouth once daily 5)  Calcium 600 1500 Mg Tabs (Calcium carbonate) .... Two times a day 6)  Bl Vitamin C 500 Mg Tabs (Ascorbic acid) .... Once daily 7)  Multivitamins Tabs (Multiple vitamin) .... Take 1 tablet by mouth once  a day 8)  Ocuvite Preservision Tabs (Multiple vitamins-minerals) .... Two times a day 9)  Fish Oil 1200 Mg Caps (Omega-3 fatty acids) .[name].. 1 cap two times a day 10)  Amlodipine Besylate 5 Mg Tabs (Amlodipine besylate) .[name].. 1 tab once daily 11)  Plavix 75 Mg Tabs (Clopidogrel bisulfate) .[name].. 1 tab once daily 12)  Hydrochlorothiazide 12.5 Mg Caps (Hydrochlorothiazide) .[name].. 1 cap once daily 13)  Tylenol Extra Strength 500 Mg Tabs (Acetaminophen) .... Up to 6 a day for back pain 14)  Lovenox 40 Mg/0.37ml Soln (Enoxaparin sodium) .... Use as directed by physician   Orders Added: 1)  New Patient Level III [phone]

## 2010-04-29 LAB — PROTIME-INR
INR: 0.93 (ref 0.00–1.49)
Prothrombin Time: 12.7 seconds (ref 11.6–15.2)

## 2010-04-29 LAB — RETICULOCYTES
RBC.: 3.84 MIL/uL — ABNORMAL LOW (ref 3.87–5.11)
Retic Count, Absolute: 53.8 10*3/uL (ref 19.0–186.0)
Retic Ct Pct: 1.4 % (ref 0.4–3.1)

## 2010-04-29 LAB — URINALYSIS, ROUTINE W REFLEX MICROSCOPIC
Bilirubin Urine: NEGATIVE
Glucose, UA: NEGATIVE mg/dL
Hgb urine dipstick: NEGATIVE
Ketones, ur: NEGATIVE mg/dL
Nitrite: NEGATIVE
Protein, ur: NEGATIVE mg/dL
Specific Gravity, Urine: 1.012 (ref 1.005–1.030)
Urobilinogen, UA: 0.2 mg/dL (ref 0.0–1.0)
pH: 6.5 (ref 5.0–8.0)

## 2010-04-29 LAB — COMPREHENSIVE METABOLIC PANEL
ALT: 18 U/L (ref 0–35)
ALT: 18 U/L (ref 0–35)
AST: 26 U/L (ref 0–37)
AST: 32 U/L (ref 0–37)
Albumin: 3.3 g/dL — ABNORMAL LOW (ref 3.5–5.2)
Albumin: 4.2 g/dL (ref 3.5–5.2)
Alkaline Phosphatase: 48 U/L (ref 39–117)
Alkaline Phosphatase: 84 U/L (ref 39–117)
BUN: 18 mg/dL (ref 6–23)
BUN: 31 mg/dL — ABNORMAL HIGH (ref 6–23)
CO2: 27 mEq/L (ref 19–32)
CO2: 28 mEq/L (ref 19–32)
Calcium: 8.6 mg/dL (ref 8.4–10.5)
Calcium: 9.8 mg/dL (ref 8.4–10.5)
Chloride: 104 mEq/L (ref 96–112)
Chloride: 108 mEq/L (ref 96–112)
Creatinine, Ser: 0.77 mg/dL (ref 0.4–1.2)
Creatinine, Ser: 0.9 mg/dL (ref 0.4–1.2)
GFR calc Af Amer: >60 mL/min (ref >60)
GFR calc Af Amer: >60 mL/min (ref >60)
GFR calc non Af Amer: >60 mL/min (ref >60)
GFR calc non Af Amer: >60 mL/min (ref >60)
Glucose, Bld: 91 mg/dL (ref 70–99)
Glucose, Bld: 93 mg/dL (ref 70–99)
Potassium: 3.8 mEq/L (ref 3.5–5.1)
Potassium: 4.2 mEq/L (ref 3.5–5.1)
Sodium: 142 mEq/L (ref 135–145)
Sodium: 143 mEq/L (ref 135–145)
Total Bilirubin: 0.6 mg/dL (ref 0.3–1.2)
Total Bilirubin: 0.7 mg/dL (ref 0.3–1.2)
Total Protein: 5.7 g/dL — ABNORMAL LOW (ref 6.0–8.3)
Total Protein: 8.1 g/dL (ref 6.0–8.3)

## 2010-04-29 LAB — LIPID PANEL
Cholesterol: 249 mg/dL — ABNORMAL HIGH (ref 0–200)
HDL: 58 mg/dL (ref >39)
LDL Cholesterol: 138 mg/dL — ABNORMAL HIGH (ref 0–99)
Total CHOL/HDL Ratio: 4.3 RATIO
Triglycerides: 267 mg/dL — ABNORMAL HIGH (ref <150)
VLDL: 53 mg/dL — ABNORMAL HIGH (ref 0–40)

## 2010-04-29 LAB — CBC
HCT: 38 % (ref 36.0–46.0)
HCT: 41.2 % (ref 36.0–46.0)
Hemoglobin: 12.2 g/dL (ref 12.0–15.0)
Hemoglobin: 13.7 g/dL (ref 12.0–15.0)
MCH: 33.5 pg (ref 26.0–34.0)
MCH: 33.8 pg (ref 26.0–34.0)
MCHC: 32.1 g/dL (ref 30.0–36.0)
MCHC: 33.4 g/dL (ref 30.0–36.0)
MCV: 101.3 fL — ABNORMAL HIGH (ref 78.0–100.0)
MCV: 104.4 fL — ABNORMAL HIGH (ref 78.0–100.0)
Platelets: 145 10*3/uL — ABNORMAL LOW (ref 150–400)
Platelets: 229 10*3/uL (ref 150–400)
RBC: 3.64 MIL/uL — ABNORMAL LOW (ref 3.87–5.11)
RBC: 4.06 MIL/uL (ref 3.87–5.11)
RDW: 13.7 % (ref 11.5–15.5)
RDW: 14.7 % (ref 11.5–15.5)
WBC: 5.1 10*3/uL (ref 4.0–10.5)
WBC: 7 10*3/uL (ref 4.0–10.5)

## 2010-04-29 LAB — URINE MICROSCOPIC-ADD ON

## 2010-04-29 LAB — DIFFERENTIAL
Basophils Absolute: 0 10*3/uL (ref 0.0–0.1)
Basophils Relative: 1 % (ref 0–1)
Eosinophils Absolute: 0.1 10*3/uL (ref 0.0–0.7)
Eosinophils Relative: 2 % (ref 0–5)
Lymphocytes Relative: 34 % (ref 12–46)
Lymphs Abs: 2.4 10*3/uL (ref 0.7–4.0)
Monocytes Absolute: 0.8 10*3/uL (ref 0.1–1.0)
Monocytes Relative: 11 % (ref 3–12)
Neutro Abs: 3.7 10*3/uL (ref 1.7–7.7)
Neutrophils Relative %: 53 % (ref 43–77)

## 2010-04-29 LAB — VITAMIN B12: Vitamin B-12: 665 pg/mL (ref 211–911)

## 2010-04-29 LAB — APTT: aPTT: 36 seconds (ref 24–37)

## 2010-04-29 LAB — CARDIAC PANEL(CRET KIN+CKTOT+MB+TROPI)
CK, MB: 1.7 ng/mL (ref 0.3–4.0)
Relative Index: INVALID (ref 0.0–2.5)
Total CK: 46 U/L (ref 7–177)
Troponin I: 0.05 ng/mL (ref 0.00–0.06)

## 2010-04-29 LAB — FOLATE: Folate: >20 ng/mL

## 2010-04-29 LAB — IRON AND TIBC
Iron: 40 ug/dL — ABNORMAL LOW (ref 42–135)
Saturation Ratios: 14 % — ABNORMAL LOW (ref 20–55)
TIBC: 284 ug/dL (ref 250–470)
UIBC: 244 ug/dL

## 2010-04-29 LAB — SEDIMENTATION RATE: Sed Rate: 24 mm/hr — ABNORMAL HIGH (ref 0–22)

## 2010-04-29 LAB — HEMOGLOBIN A1C
Hgb A1c MFr Bld: 5.7 % — ABNORMAL HIGH (ref <5.7)
Mean Plasma Glucose: 117 mg/dL — ABNORMAL HIGH (ref <117)

## 2010-04-29 LAB — FERRITIN: Ferritin: 120 ng/mL (ref 10–291)

## 2010-04-29 LAB — MAGNESIUM: Magnesium: 1.9 mg/dL (ref 1.5–2.5)

## 2010-05-18 ENCOUNTER — Telehealth: Payer: Self-pay | Admitting: *Deleted

## 2010-05-18 NOTE — Telephone Encounter (Signed)
Per MD, pt needs ov tomorrow to discuss lovenox. Pt aware, Scheduled for 1pm tomorrow.

## 2010-05-19 ENCOUNTER — Encounter: Payer: Self-pay | Admitting: Internal Medicine

## 2010-05-19 ENCOUNTER — Ambulatory Visit (HOSPITAL_COMMUNITY)
Admission: RE | Admit: 2010-05-19 | Discharge: 2010-05-19 | Disposition: A | Discharge status: Home / Self Care | Payer: Medicare Other | Source: Ambulatory Visit | Attending: Neurosurgery | Admitting: Neurosurgery

## 2010-05-19 ENCOUNTER — Other Ambulatory Visit (HOSPITAL_COMMUNITY): Payer: Self-pay | Admitting: Neurosurgery

## 2010-05-19 ENCOUNTER — Ambulatory Visit (INDEPENDENT_AMBULATORY_CARE_PROVIDER_SITE_OTHER): Payer: Medicare Other | Admitting: Internal Medicine

## 2010-05-19 ENCOUNTER — Telehealth: Payer: Self-pay | Admitting: Internal Medicine

## 2010-05-19 ENCOUNTER — Encounter (HOSPITAL_COMMUNITY)
Admission: RE | Admit: 2010-05-19 | Discharge: 2010-05-19 | Disposition: A | Discharge status: Home / Self Care | Payer: Medicare Other | Source: Ambulatory Visit | Attending: Neurosurgery | Admitting: Neurosurgery

## 2010-05-19 VITALS — BP 138/72 | HR 70 | Temp 98.5°F | Wt 185.0 lb

## 2010-05-19 DIAGNOSIS — Z8673 Personal history of transient ischemic attack (TIA), and cerebral infarction without residual deficits: Secondary | ICD-10-CM

## 2010-05-19 DIAGNOSIS — Z01810 Encounter for preprocedural cardiovascular examination: Secondary | ICD-10-CM | POA: Insufficient documentation

## 2010-05-19 DIAGNOSIS — Z01811 Encounter for preprocedural respiratory examination: Secondary | ICD-10-CM | POA: Insufficient documentation

## 2010-05-19 DIAGNOSIS — M7138 Other bursal cyst, other site: Secondary | ICD-10-CM

## 2010-05-19 DIAGNOSIS — Z01818 Encounter for other preprocedural examination: Secondary | ICD-10-CM | POA: Insufficient documentation

## 2010-05-19 DIAGNOSIS — M7130 Other bursal cyst, unspecified site: Secondary | ICD-10-CM | POA: Insufficient documentation

## 2010-05-19 DIAGNOSIS — Z01812 Encounter for preprocedural laboratory examination: Secondary | ICD-10-CM | POA: Insufficient documentation

## 2010-05-19 LAB — BASIC METABOLIC PANEL
BUN: 19 mg/dL (ref 6–23)
CO2: 30 mEq/L (ref 19–32)
Calcium: 10.2 mg/dL (ref 8.4–10.5)
Chloride: 100 mEq/L (ref 96–112)
Creatinine, Ser: 0.82 mg/dL (ref 0.4–1.2)
GFR calc Af Amer: >60 mL/min (ref >60)
GFR calc non Af Amer: >60 mL/min (ref >60)
Glucose, Bld: 99 mg/dL (ref 70–99)
Potassium: 4.5 mEq/L (ref 3.5–5.1)
Sodium: 138 mEq/L (ref 135–145)

## 2010-05-19 LAB — CBC
HCT: 41.1 % (ref 36.0–46.0)
Hemoglobin: 13.5 g/dL (ref 12.0–15.0)
MCH: 33.3 pg (ref 26.0–34.0)
MCHC: 32.8 g/dL (ref 30.0–36.0)
MCV: 101.5 fL — ABNORMAL HIGH (ref 78.0–100.0)
Platelets: 228 10*3/uL (ref 150–400)
RBC: 4.05 MIL/uL (ref 3.87–5.11)
RDW: 13.6 % (ref 11.5–15.5)
WBC: 5.7 10*3/uL (ref 4.0–10.5)

## 2010-05-19 LAB — SURGICAL PCR SCREEN
MRSA, PCR: NEGATIVE
Staphylococcus aureus: NEGATIVE

## 2010-05-19 NOTE — Telephone Encounter (Signed)
LOV faxed over to [facility] @ [phone] 05/19/10/KM

## 2010-05-19 NOTE — Progress Notes (Signed)
  Subjective:    Patient ID: Gina [name], female    DOB: 1931-10-12, 77 y.o.   MRN: [id number]  HPI Mr.s Gina [name] is for surgery next Wednesday for spinal cyst. She had been very nervous about going without any anticoagulation for the 10 days prior to surgery and we were going to use lovenox. She feels much better about this now and does not want to use lovenox. She does understand the small but real risk of a recurrent event.     Review of Systems     Objective:   Physical Exam        Assessment & Plan:

## 2010-05-24 ENCOUNTER — Telehealth: Payer: Self-pay | Admitting: *Deleted

## 2010-05-24 MED ORDER — PROMETHAZINE HCL 25 MG RE SUPP: 25.0000 mg | Freq: Four times a day (QID) | RECTAL | Status: DC | PRN

## 2010-05-24 NOTE — Telephone Encounter (Signed)
OK for phenergan 25mg  supp pr q 6; immodium prn for diarrhea

## 2010-05-24 NOTE — Telephone Encounter (Signed)
Daughter informed, rx's called in

## 2010-05-24 NOTE — Telephone Encounter (Signed)
Pt is visiting in [place], she has nausea, vomiting & diarrhea. Son in law has same symptoms. Req rx to help w/vomiting. Please advise.  PHARM - [facility] [address] [phone]

## 2010-05-26 ENCOUNTER — Inpatient Hospital Stay (HOSPITAL_COMMUNITY): Payer: Medicare Other

## 2010-05-26 ENCOUNTER — Inpatient Hospital Stay (HOSPITAL_COMMUNITY)
Admission: RE | Admit: 2010-05-26 | Discharge: 2010-05-27 | DRG: 491 | Disposition: A | Discharge status: Home / Self Care | Payer: Medicare Other | Source: Ambulatory Visit | Attending: Neurosurgery | Admitting: Neurosurgery

## 2010-05-26 DIAGNOSIS — I10 Essential (primary) hypertension: Secondary | ICD-10-CM | POA: Diagnosis present

## 2010-05-26 DIAGNOSIS — Z01812 Encounter for preprocedural laboratory examination: Secondary | ICD-10-CM

## 2010-05-26 DIAGNOSIS — M47817 Spondylosis without myelopathy or radiculopathy, lumbosacral region: Secondary | ICD-10-CM | POA: Diagnosis present

## 2010-05-26 DIAGNOSIS — Z87891 Personal history of nicotine dependence: Secondary | ICD-10-CM

## 2010-05-26 DIAGNOSIS — M7130 Other bursal cyst, unspecified site: Principal | ICD-10-CM | POA: Diagnosis present

## 2010-05-26 DIAGNOSIS — Z880 Allergy status to penicillin: Secondary | ICD-10-CM

## 2010-06-07 ENCOUNTER — Emergency Department (HOSPITAL_BASED_OUTPATIENT_CLINIC_OR_DEPARTMENT_OTHER)
Admission: EM | Admit: 2010-06-07 | Discharge: 2010-06-07 | Disposition: A | Discharge status: Nursing Home (Includes ICF) | Payer: Medicare Other | Source: Home / Self Care | Attending: Emergency Medicine | Admitting: Emergency Medicine

## 2010-06-07 ENCOUNTER — Inpatient Hospital Stay (HOSPITAL_COMMUNITY)
Admission: EM | Admit: 2010-06-07 | Discharge: 2010-06-10 | DRG: 379 | Disposition: A | Discharge status: Home / Self Care | Payer: Medicare Other | Source: Other Acute Inpatient Hospital | Attending: Internal Medicine | Admitting: Internal Medicine

## 2010-06-07 ENCOUNTER — Telehealth: Payer: Self-pay

## 2010-06-07 DIAGNOSIS — E785 Hyperlipidemia, unspecified: Secondary | ICD-10-CM | POA: Insufficient documentation

## 2010-06-07 DIAGNOSIS — Z8673 Personal history of transient ischemic attack (TIA), and cerebral infarction without residual deficits: Secondary | ICD-10-CM

## 2010-06-07 DIAGNOSIS — K922 Gastrointestinal hemorrhage, unspecified: Secondary | ICD-10-CM | POA: Insufficient documentation

## 2010-06-07 DIAGNOSIS — Z7902 Long term (current) use of antithrombotics/antiplatelets: Secondary | ICD-10-CM

## 2010-06-07 DIAGNOSIS — Z8679 Personal history of other diseases of the circulatory system: Secondary | ICD-10-CM | POA: Insufficient documentation

## 2010-06-07 DIAGNOSIS — I10 Essential (primary) hypertension: Secondary | ICD-10-CM | POA: Insufficient documentation

## 2010-06-07 DIAGNOSIS — K5731 Diverticulosis of large intestine without perforation or abscess with bleeding: Principal | ICD-10-CM | POA: Diagnosis present

## 2010-06-07 DIAGNOSIS — Z880 Allergy status to penicillin: Secondary | ICD-10-CM

## 2010-06-07 DIAGNOSIS — Z79899 Other long term (current) drug therapy: Secondary | ICD-10-CM | POA: Insufficient documentation

## 2010-06-07 DIAGNOSIS — Z888 Allergy status to other drugs, medicaments and biological substances status: Secondary | ICD-10-CM

## 2010-06-07 DIAGNOSIS — K921 Melena: Secondary | ICD-10-CM | POA: Diagnosis present

## 2010-06-07 LAB — CBC
HCT: 37.1 % (ref 36.0–46.0)
HCT: 35.5 % — ABNORMAL LOW (ref 36.0–46.0)
HCT: 33.5 % — ABNORMAL LOW (ref 36.0–46.0)
HCT: 33.5 % — ABNORMAL LOW (ref 36.0–46.0)
Hemoglobin: 12.5 g/dL (ref 12.0–15.0)
Hemoglobin: 11.7 g/dL — ABNORMAL LOW (ref 12.0–15.0)
Hemoglobin: 11.2 g/dL — ABNORMAL LOW (ref 12.0–15.0)
Hemoglobin: 11 g/dL — ABNORMAL LOW (ref 12.0–15.0)
MCH: 33.6 pg (ref 26.0–34.0)
MCH: 33.3 pg (ref 26.0–34.0)
MCH: 33.8 pg (ref 26.0–34.0)
MCH: 33.1 pg (ref 26.0–34.0)
MCHC: 33.7 g/dL (ref 30.0–36.0)
MCHC: 33 g/dL (ref 30.0–36.0)
MCHC: 33.4 g/dL (ref 30.0–36.0)
MCHC: 32.8 g/dL (ref 30.0–36.0)
MCV: 99.7 fL (ref 78.0–100.0)
MCV: 101.1 fL — ABNORMAL HIGH (ref 78.0–100.0)
MCV: 101.2 fL — ABNORMAL HIGH (ref 78.0–100.0)
MCV: 100.9 fL — ABNORMAL HIGH (ref 78.0–100.0)
Platelets: 258 10*3/uL (ref 150–400)
Platelets: 234 10*3/uL (ref 150–400)
Platelets: 230 10*3/uL (ref 150–400)
Platelets: 249 10*3/uL (ref 150–400)
RBC: 3.72 MIL/uL — ABNORMAL LOW (ref 3.87–5.11)
RBC: 3.51 MIL/uL — ABNORMAL LOW (ref 3.87–5.11)
RBC: 3.31 MIL/uL — ABNORMAL LOW (ref 3.87–5.11)
RBC: 3.32 MIL/uL — ABNORMAL LOW (ref 3.87–5.11)
RDW: 13.4 % (ref 11.5–15.5)
RDW: 13.8 % (ref 11.5–15.5)
RDW: 13.8 % (ref 11.5–15.5)
RDW: 13.7 % (ref 11.5–15.5)
WBC: 10.9 10*3/uL — ABNORMAL HIGH (ref 4.0–10.5)
WBC: 11.1 10*3/uL — ABNORMAL HIGH (ref 4.0–10.5)
WBC: 7.9 10*3/uL (ref 4.0–10.5)
WBC: 8 10*3/uL (ref 4.0–10.5)

## 2010-06-07 LAB — COMPREHENSIVE METABOLIC PANEL
ALT: 21 U/L (ref 0–35)
AST: 22 U/L (ref 0–37)
Albumin: 3.1 g/dL — ABNORMAL LOW (ref 3.5–5.2)
Alkaline Phosphatase: 48 U/L (ref 39–117)
BUN: 32 mg/dL — ABNORMAL HIGH (ref 6–23)
CO2: 27 mEq/L (ref 19–32)
Calcium: 8.8 mg/dL (ref 8.4–10.5)
Chloride: 104 mEq/L (ref 96–112)
Creatinine, Ser: 0.92 mg/dL (ref 0.4–1.2)
GFR calc Af Amer: >60 mL/min (ref >60)
GFR calc non Af Amer: 59 mL/min — ABNORMAL LOW (ref >60)
Glucose, Bld: 120 mg/dL — ABNORMAL HIGH (ref 70–99)
Potassium: 4.3 mEq/L (ref 3.5–5.1)
Sodium: 139 mEq/L (ref 135–145)
Total Bilirubin: 0.7 mg/dL (ref 0.3–1.2)
Total Protein: 5.8 g/dL — ABNORMAL LOW (ref 6.0–8.3)

## 2010-06-07 LAB — BASIC METABOLIC PANEL
BUN: 36 mg/dL — ABNORMAL HIGH (ref 6–23)
CO2: 25 mEq/L (ref 19–32)
Calcium: 9.5 mg/dL (ref 8.4–10.5)
Chloride: 100 mEq/L (ref 96–112)
Creatinine, Ser: 0.9 mg/dL (ref 0.4–1.2)
GFR calc Af Amer: >60 mL/min (ref >60)
GFR calc non Af Amer: >60 mL/min (ref >60)
Glucose, Bld: 149 mg/dL — ABNORMAL HIGH (ref 70–99)
Potassium: 4.1 mEq/L (ref 3.5–5.1)
Sodium: 136 mEq/L (ref 135–145)

## 2010-06-07 LAB — DIFFERENTIAL
Basophils Absolute: 0.1 10*3/uL (ref 0.0–0.1)
Basophils Relative: 1 % (ref 0–1)
Eosinophils Absolute: 0.2 10*3/uL (ref 0.0–0.7)
Eosinophils Relative: 1 % (ref 0–5)
Lymphocytes Relative: 15 % (ref 12–46)
Lymphs Abs: 1.7 10*3/uL (ref 0.7–4.0)
Monocytes Absolute: 0.9 10*3/uL (ref 0.1–1.0)
Monocytes Relative: 8 % (ref 3–12)
Neutro Abs: 8.1 10*3/uL — ABNORMAL HIGH (ref 1.7–7.7)
Neutrophils Relative %: 74 % (ref 43–77)

## 2010-06-07 LAB — PROTIME-INR
INR: 0.94 (ref 0.00–1.49)
Prothrombin Time: 12.8 seconds (ref 11.6–15.2)

## 2010-06-07 LAB — TYPE AND SCREEN
ABO/RH(D): B POS
Antibody Screen: NEGATIVE

## 2010-06-07 LAB — ABO/RH: ABO/RH(D): B POS

## 2010-06-07 LAB — HEMOCCULT GUIAC POC 1CARD (OFFICE): Fecal Occult Bld: POSITIVE

## 2010-06-07 LAB — APTT: aPTT: 39 seconds — ABNORMAL HIGH (ref 24–37)

## 2010-06-07 LAB — MRSA PCR SCREENING: MRSA by PCR: NEGATIVE

## 2010-06-07 NOTE — Telephone Encounter (Signed)
[facility] Triage Call Report Triage Record Num: [id number] Operator: Migdalia Dk Patient Name: Gina [name] Call Date & Time: 06/06/2010 11:07:41PM Patient Phone: [phone] PCP: Illene Regulus Patient Gender: Female PCP Fax : [phone] Patient DOB: [birth year] Practice Name: [facility] Reason for Call: Pt. states had cyst removed from spine on 05/26/2010. States normally takes Plavix and had been off of it since 05/10/2010. Started back on Plavix on 05/31/2010 , now tonight with two episodes of blood in stool. "The first movement was normal, but it had blood in it and it turned the toilet water red, the second was loose and there was more blood." Bright red blood noted in stools, denies any other emergent symptoms. Pt. advised to go to ER for evaluation. Protocol(s) Used: Gastrointestinal Bleeding Recommended Outcome per Protocol: See Provider within 4 hours Reason for Outcome: One or more episodes of rectal bleeding (more than scant) and no symptoms of hypovolemia Care Advice: ~ 06/06/2010 11:15:07PM Page 1 of 1 [email or web address]

## 2010-06-08 DIAGNOSIS — K921 Melena: Secondary | ICD-10-CM

## 2010-06-08 LAB — HEMOGLOBIN AND HEMATOCRIT, BLOOD
HCT: 34.1 % — ABNORMAL LOW (ref 36.0–46.0)
HCT: 31.6 % — ABNORMAL LOW (ref 36.0–46.0)
Hemoglobin: 11.1 g/dL — ABNORMAL LOW (ref 12.0–15.0)
Hemoglobin: 10.2 g/dL — ABNORMAL LOW (ref 12.0–15.0)

## 2010-06-09 LAB — HEMOGLOBIN AND HEMATOCRIT, BLOOD
HCT: 31.7 % — ABNORMAL LOW (ref 36.0–46.0)
HCT: 30.7 % — ABNORMAL LOW (ref 36.0–46.0)
Hemoglobin: 10.4 g/dL — ABNORMAL LOW (ref 12.0–15.0)
Hemoglobin: 10.1 g/dL — ABNORMAL LOW (ref 12.0–15.0)

## 2010-06-09 NOTE — Op Note (Signed)
[name], [name]             ACCOUNT NO.:  [id number]  MEDICAL RECORD NO.:  [id number]           PATIENT TYPE:  I  LOCATION:  3526                         FACILITY:  [facility]  PHYSICIAN:  Hewitt Shorts, M.D.DATE OF BIRTH:  September 11, 1931  DATE OF PROCEDURE:  05/26/2010 DATE OF DISCHARGE:                              OPERATIVE REPORT   PREOPERATIVE DIAGNOSES:  Left L5-S1 synovial cyst, lumbar spondylosis in particular facet arthropathy, lumbar radiculopathy.  POSTOPERATIVE DIAGNOSES:  Left L5-S1 synovial cyst, lumbar spondylosis in particular facet arthropathy, lumbar radiculopathy.  PROCEDURE:  Left L5-S1 lumbar laminotomy and resection of left L5-S1 synovial cyst with microdissection and microsurgical technique in the operating microscope.  SURGEON:  Hewitt Shorts, MD.  ASSISTANT:  Aura Fey. Dennison Bulla, [place].  ANESTHESIA:  General endotracheal.  INDICATIONS:  The patient is a 77 year old woman who presented with left lumbar radiculopathy, was found to have a moderately large left L5-S1 lumbar synovial cyst emanating from a left L5-S1 facet with advanced arthropathy.  There was significant thecal sac and nerve root compression.  A decision was made to pursue with decompression.  PROCEDURE:  The patient was brought to the operating room and placed under general endotracheal anesthesia.  The patient was turned to a prone position.  Lumbar region was prepped with Betadine soap solution and draped in sterile fashion.  The midline was infiltrated with local anesthetic with epinephrine.  An x-ray was taken for localization.  We made an incision over the L5-S1 level that was carried down through the subcutaneous tissue to the lumbar fascia which was incised on the left side of the midline.  The paraspinal muscles were dissected from the spinous process of the lamina in a subperiosteal fashion.  The self- retaining retractor was placed.  The x-ray was taken for localization  of the L5-S1 interlaminar space and then the operating microscope was draped and brought into the field to provide additional navigation, illumination, and visualization.  The decompression was performed using microdissection and microsurgical technique.  Laminotomy was performed using the high-speed drill and Kerrison punches.  Ligamentum flavum was carefully removed and we began to see ventral to the ligamentum flavum the synovial cyst that was mixed soft and partially calcified mass.  We were able to gradually mobilize it away from the thecal sac and nerve roots and remove it in a piecemeal fashion.  We removed in its entirety and achieved good decompression of the thecal sac and nerve root.  We visualized the annulus of the L5-S1 disk and followed the S1 nerve root distally into the nerve root foramen.  There was no residual compression further rostrally, likewise there was no compression.  The wound was irrigated with bacitracin solution.  Hemostasis was established with use of bipolar cautery; and then prior to closure, we instilled 2 mL of fentanyl and 80 mg of Depo-Medrol into the epidural space and then proceeded with closure.  Deep fascia was closed with interrupted undyed 1-Vicryl sutures.  Scarpa fascia closed with interrupted undyed 1-0 and 2-0 Vicryl sutures and the subcutaneous and subcuticular were closed with interrupted inverted 2-0 and 3-0 undyed Vicryl sutures.  Skin  was reapproximated with Dermabond.  The procedure was tolerated well.  The estimated blood loss was 50 mL.  Sponge count were correct.  Following surgery, the patient was turned back to the supine position to be reversed from the anesthetic, extubated, and transferred to the recovery room for further care.     Hewitt Shorts, M.D.     RWN/MEDQ  D:  05/26/2010  T:  05/27/2010  Job:  [id number]  Electronically Signed by Shirlean Kelly M.D. on 06/09/2010 12:50:40 PM

## 2010-06-10 LAB — HEMOGLOBIN AND HEMATOCRIT, BLOOD
HCT: 31.1 % — ABNORMAL LOW (ref 36.0–46.0)
Hemoglobin: 10.1 g/dL — ABNORMAL LOW (ref 12.0–15.0)

## 2010-06-12 NOTE — H&P (Signed)
Gina [name], Gina [name]             ACCOUNT NO.:  [id number]  MEDICAL RECORD NO.:  [id number]           PATIENT TYPE:  I  LOCATION:  2101                         FACILITY:  [facility]  PHYSICIAN:  Michiel Cowboy, MDDATE OF BIRTH:  [birth year]  DATE OF ADMISSION:  06/07/2010 DATE OF DISCHARGE:                             HISTORY & PHYSICAL   PRIMARY CARE PROVIDER:  Rosalyn Gess. Norins, MD  CHIEF COMPLAINT:  Lower GI bleed.  The patient is a 77 year old female with past history of recent synovial cyst removal.  She also has a past history of TIA for which she is on Plavix.  She restarted her Plavix a week ago.  She also started to take Aleve for her back pain.  Today, she developed bright red blood per rectum, moderate amount.  She presented to [facility] ED.  She was still having blood in her rectum, which was bright read.  No tarry stools.  No melena.  No epigastric pain or otherwise any abdominal discomfort.  No nausea, vomiting.  No hematemesis.  No fevers.  No chills.  No shortness of breath.  No lightheadedness.  The patient overall is fairly stable, but given the fact that she continued to have blood in her rectal vault, she was admitted to step-down.  PAST MEDICAL HISTORY:  Significant for: 1. History of TIA. 2. Gout. 3. History of synovial cyst removal recently. 4. Hypertension. 5. Hyperlipidemia. 6. The patient has a history of diverticulosis, but never had a GI     bleed before.  REVIEW OF SYSTEMS:  Negative except for HPI.  SOCIAL HISTORY:  The patient does not smoke or drink or abuse drugs.  FAMILY HISTORY:  Noncontributory.  ALLERGIES:  ASPIRIN and PENICILLIN, both causing rash.  MEDICATIONS: 1. Recently, she had been taking Aleve. 2. Allopurinol 100 mg daily. 3. Plavix 75 mg daily. 4. Calcium. 5. Fish oil 1200 mg daily. 6. Multivitamins. 7. PreserVision. 8. Vitamin C. 9. Enalapril 10 mg daily. 10.Amlodipine 5 mg daily. 11.Lipitor 40 mg  daily. 12.Claritin 10 mg daily. 13.Hydrochlorothiazide 12.5 mg daily.  PHYSICAL EXAMINATION:  VITAL SIGNS:  Temperature 98.4, blood pressure 167/56, pulse 98, respirations 20, satting 96% on room air. GENERAL:  The patient appears to be in no acute distress. HEENT:  Head nontraumatic.  Moist mucous membranes. LUNGS:  Clear to auscultation bilaterally. HEART:  Regular rhythm with no murmurs appreciated. ABDOMEN:  Soft, nontender, slightly obese, nondistended. EXTREMITIES:  Lower extremities without clubbing, cyanosis, or edema. NEUROLOGIC:  Intact. RECTAL:  Hemoccult positive.  LABORATORY DATA:  White blood cell count 10.9, hemoglobin 12.5.  Sodium 136, potassium 4.1, BUN 36, creatinine 0.9.  Cardiac enzymes unremarkable.  Hemoccult positive.  ASSESSMENT AND PLAN:  This is a 77 year old female with likely lower gastrointestinal bleed, although she does have some risk factors for ulcers and sores with recent use of NSAIDs and slightly elevated BUN. We will recommend GI consult in the a.m.  She is completely hemodynamically stable.  Currently, we will obtain type and screen for her CBC and give Protonix IV.  History of hypertension:  We will hold her hydrochlorothiazide and amlodipine.  We  may continue enalapril holding parameters and then restart her medications if she does not develop any hypertension.  We will continue her Lipitor and allopurinol.  History of transient ischemic attack:  We will stop her Plavix while she is having active GI bleed.  Prophylaxis:  Protonix and SCDs.  Code status:  The patient wished to be full code.     Michiel Cowboy, MD     AVD/MEDQ  D:  06/07/2010  T:  06/07/2010  Job:  [id number]  cc:   Almedia Balls. Ranell Patrick, M.D.  Electronically Signed by Therisa Doyne MD on 06/12/2010 09:44:34 PM

## 2010-06-14 ENCOUNTER — Other Ambulatory Visit (INDEPENDENT_AMBULATORY_CARE_PROVIDER_SITE_OTHER): Payer: Medicare Other

## 2010-06-14 DIAGNOSIS — K921 Melena: Secondary | ICD-10-CM

## 2010-06-14 LAB — HEMOGLOBIN: Hemoglobin: 10.3 g/dL — ABNORMAL LOW (ref 12.0–15.0)

## 2010-06-14 LAB — HEMATOCRIT: HCT: 30.1 % — ABNORMAL LOW (ref 36.0–46.0)

## 2010-06-19 NOTE — Discharge Summary (Signed)
Gina [name], Gina [name]             ACCOUNT NO.:  [id number]  MEDICAL RECORD NO.:  [id number]           PATIENT TYPE:  I  LOCATION:  6715                         FACILITY:  [facility]  PHYSICIAN:  Rosalyn Gess. Norins, MD  DATE OF BIRTH:  09-20-1931  DATE OF ADMISSION:  06/07/2010 DATE OF DISCHARGE:  06/10/2010                              DISCHARGE SUMMARY   ADMITTING DIAGNOSIS:  Hematochezia.  DISCHARGE DIAGNOSES: 1. Lower gastrointestinal bleed. 2. Recent back surgery, stable. 3. Neuro, stable with no evidence of recurrent stroke.  CONSULTANTS:  None.  PROCEDURES:  No procedures.  HISTORY OF PRESENT ILLNESS:  Gina [name] is a delightful 77 year old woman who recently had low back surgery for excision of a synovial cyst. The patient also with a history of TIA for which she has been on Plavix and has been doing well.  She just recently restarted her Plavix after her surgery.  The patient has also been taking Aleve for back pain.  On the day of admission, she developed bright red blood per rectum with a moderate amount of blood in the stool.  She presented to the [facility] ED and was still having blood in her rectum which was bright red.  She had no history of dark, tarry stools, melena.  No epigastric pain or hematemesis.  No nausea, no vomiting.  The patient did appear stable on our evaluation, but because of continued bright red hematochezia, was admitted for observation.  Please see the H and P for past medical history, family history, social history, and medications.  Admission examination significant for being afebrile, blood pressure 167/56, heart rate 98, respirations 20.  General appearance is a well- nourished, elderly woman in no acute distress.  Abdomen was soft.  No guarding or rebound.  No tenderness.  Rectal exam was positive for blood.  ADMISSION LABORATORY:  White blood count 10,900, hemoglobin was 12.5 grams.  Electrolytes were normal.  Creatinine  was 0.9.  Cardiac enzymes were unremarkable.  HOSPITAL COURSE:  Lower GI bleed.  The patient was admitted to the step- down unit and monitored.  She initially did drop her hemoglobin from 12.5-11 grams.  She otherwise remained hemodynamically stable.  The patient did have a few bloody stools following admission but for the last 24 hours has not had any bloody stools.  Her hemoglobin did drop to a low of 10.1 grams.  Her hemoglobin has been stable over the last 24 hours since 0400 hours on June 09, 2010 with hemoglobins of 10.4, 10.1, and this morning 10.1.  The patient feels well.  She has no abdominal pain.  She has had a good appetite.  She has been up and ambulating.  With the patient's hemoglobin being stable, with no evidence of ongoing lower GI bleed, she is thought to be stable and ready for discharge home.  DISCHARGE EXAMINATION:  VITAL SIGNS:  Temperature of 97.8, blood pressure 125/80, heart rate 76, respirations 20. GENERAL APPEARANCE:  A pleasant woman looking her stated age in no acute distress.HEENT:  Unremarkable. CHEST:  The patient is moving air well.  There is no increased work of breathing.  No wheezing. CARDIOVASCULAR:  2+ radial pulse.  Her precordium was quiet.  She had a regular rate and rhythm. ABDOMEN:  Obese.  She has positive bowel sounds.  There is no guarding or rebound.  No further examination conducted.  FINAL LABORATORY:  Hemoglobin and hematocrit on the day of discharge is 10.1 and 31.1%.  No additional laboratory since admission.  DISPOSITION:  The patient is discharged to home.  She will resume her Plavix in 24 hours if she has no recurrent bleeding.  She will continue to wear her corset soon as instructed since her surgery.  The patient will report to [facility] Office for laboratory on Monday, June 14, 2010 for followup H and H.  The patient is carefully instructed that if she has recurrent hematochezia, she should stop her Plavix.  She  should call for attention but not necessarily to the emergency department.  FINAL DIAGNOSIS:  Hematochezia with probable diverticular bleed, controlled.  The patient's condition at the time of discharge dictation is stable and improved.     Rosalyn Gess Norins, MD     MEN/MEDQ  D:  06/10/2010  T:  06/10/2010  Job:  [id number]  Electronically Signed by Illene Regulus MD on 06/19/2010 12:33:39 PM

## 2010-07-02 NOTE — Assessment & Plan Note (Signed)
[facility]                           PRIMARY CARE OFFICE NOTE   NAME:[name], Gina [name]                    MRN:          [id number]  DATE:01/12/2006                            DOB:          1931-04-11    Ms. [name] is a delightful woman who presents for a followup  evaluation of her medical problems.  She was last in for a physical exam  January 10, 2005.  Please see that complete dictation for past medical  history, family history and social history.   INTERVAL HISTORY:  The patient was seen Jul 12, 2005, for followup of  her hypertension and also for probable bursitis of her shoulder, which  resolved.  She was seen August 11, 2005, for diarrhea and abdominal  discomfort thought to be possible diverticulitis.  The patient did  undergo CT scan of the abdomen and pelvis which was negative.   The patient did undergo cataract surgery with intraocular lens implants  in a staged manner and has done extremely well.  These are prescription  lens implants and so her vision has improved but she now needs reading  glasses.   INTERVAL SOCIAL HISTORY:  Stable.  The patient is now married 52 years.  She had a great trip down the [place] and [place].  Her boys have been home  and will be home over the holidays.  Her daughter-in-law is doing great  - [employer].   REVIEW OF SYSTEMS:  Negative for any constitutional problems.  Ophthalmology per the above.  No ENT, cardiovascular, respiratory, GI,  or GU problems.   HEALTH MAINTENANCE:  The patient's last Pap smear was June 2007 and was  unremarkable.  Last mammogram was November 2007 and was negative.  Last  colonoscopy was October 24, 2002, with followup scheduled for 2009.  The patient is given Zostavax at today's visit and also tetanus and  diphtheria booster.   CURRENT MEDICATIONS:  1. Enalapril 10 mg daily.  2. Lipitor 20 mg daily.  3. Allopurinol 100 mg daily.  4. Allegra 60 mg b.i.d.  5.  Calcium daily.  6. Vitamin C daily.  7. Multivitamin daily.  8. Ocuvite daily.  9. PreserVision b.i.d.   PHYSICAL EXAMINATION:  VITAL SIGNS:  Temperature was 98, blood pressure  151/73, pulse 86, weight 190 pounds, height is 5 feet 5 inches.  GENERAL APPEARANCE:  This is an overweight Caucasian woman who looks her  stated age who is in no acute distress.  HEENT:  Normocephalic, atraumatic.  EACs and TMs were unremarkable.  Oropharynx was clear.  Conjunctivae and sclerae were clear.  The patient  has intraocular lens implants.  Funduscopic exam deferred to  ophthalmology.  NECK:  Supple without thyromegaly.  NODES:  No adenopathy was noted in the cervical or supraclavicular  regions.  CHEST:  No CVA tenderness.  LUNGS:  Clear to auscultation and percussion.  CARDIOVASCULAR:  Shows 2+ radial pulse.  No JVD or carotid bruits.  She  had a quiet precordium with a regular rate and rhythm without murmurs,  rubs or gallops.  BREASTS:  Exam deferred to gynecology.  ABDOMEN:  Soft, no guarding or rebound, no organosplenomegaly was  appreciated.  PELVIC AND RECTAL:  Deferred to gynecology.  EXTREMITIES:  Without clubbing, cyanosis, edema, or deformity.  No signs  of flare of gout or other arthritic problem.   LABORATORY:  Urinalysis was negative.  Lipids revealed a cholesterol of  190, triglycerides 192, HDL was excellent at 56.5, LDL was excellent at  95.  Hemoglobin 13.4 g, white count was 5900.  Chemistries revealed a  serum glucose of 109, electrolytes were normal, kidney function normal  with a creatinine of 1.0 and a GFR of 58.  Liver functions were normal.  Thyroid function normal with a TSH of 3.05.   ASSESSMENT AND PLAN:  1. Hypertension.  The patient's blood pressure is mildly elevated at      today's visit at 151/73.  In reviewing her chart, at her last visit      it was 169/72 but prior to that 121/66 and 138/66.  Plan:  The      patient to continue on her present medications  including enalapril.      I asked her to continue to monitor her blood pressures at home.  If      she continues to run systolic pressures greater than 140 would want      to add a diuretic to her regimen.  2. Lipids.  The patient has got excellent control with Lipitor.  She      will continue the same.  3. Gout, stable, with no acute breakthroughs.  4. Health maintenance.  Current and up-to-date as noted.     Gina Gess Norins, MD  Electronically Signed    MEN/MedQ  DD: 01/12/2006  DT: 01/13/2006  Job #: [id number]   cc:   Gina [name]

## 2010-07-13 ENCOUNTER — Encounter: Payer: Self-pay | Admitting: Internal Medicine

## 2010-07-13 ENCOUNTER — Ambulatory Visit (INDEPENDENT_AMBULATORY_CARE_PROVIDER_SITE_OTHER): Payer: Medicare Other | Admitting: Internal Medicine

## 2010-07-13 ENCOUNTER — Ambulatory Visit (HOSPITAL_COMMUNITY)
Admission: RE | Admit: 2010-07-13 | Discharge: 2010-07-13 | Disposition: A | Discharge status: Home / Self Care | Payer: Medicare Other | Source: Ambulatory Visit | Attending: Internal Medicine | Admitting: Internal Medicine

## 2010-07-13 ENCOUNTER — Telehealth: Payer: Self-pay | Admitting: *Deleted

## 2010-07-13 VITALS — BP 130/66 | HR 60 | Temp 97.9°F | Wt 181.0 lb

## 2010-07-13 DIAGNOSIS — M7120 Synovial cyst of popliteal space [Baker], unspecified knee: Secondary | ICD-10-CM

## 2010-07-13 DIAGNOSIS — M7989 Other specified soft tissue disorders: Secondary | ICD-10-CM | POA: Insufficient documentation

## 2010-07-13 DIAGNOSIS — M79609 Pain in unspecified limb: Secondary | ICD-10-CM

## 2010-07-13 DIAGNOSIS — M660 Rupture of popliteal cyst: Secondary | ICD-10-CM

## 2010-07-13 NOTE — Telephone Encounter (Signed)
Usual treatment is elevation and time. APAP for pain

## 2010-07-13 NOTE — Telephone Encounter (Signed)
Patient informed. 

## 2010-07-13 NOTE — Telephone Encounter (Signed)
Vascular lab called - Pt has ruptured baker's cyst. MD notified and pt advised ok to go home. She wants to know what to do now? I advised vascular lab I would get MD's recommendation and call pt later today w/MD's advisement.

## 2010-07-14 DIAGNOSIS — M660 Rupture of popliteal cyst: Secondary | ICD-10-CM | POA: Insufficient documentation

## 2010-07-14 NOTE — Progress Notes (Signed)
  Subjective:    Patient ID: Gina [name], female    DOB: [birth year], 77 y.o.   MRN: [id number]  HPI Gina [name] is s/p recent back surgery with cyst removal. She has done well. She presents today with a 5 day h/o swelling of the left LE with pain in the calve. She has had no long periods of immobility. She has had no injury or episodes of over-use. She is on plavix. She denies shortness of breath, chest pain or sense of impending doom.  PMH, FamHx and SocHx reviewed for any changes and relevance.    Review of Systems Review of Systems  Constitutional:  Negative for fever, chills, activity change and unexpected weight change.  HENT:  Negative for hearing loss, ear pain, congestion, neck stiffness and postnasal drip.   Eyes: Negative for pain, discharge and visual disturbance.  Respiratory: Negative for chest tightness and wheezing.   Cardiovascular: Negative for chest pain and palpitations.       [No decreased exercise tolerance Gastrointestinal: [No change in bowel habit. No bloating or gas. No reflux or indigestion Genitourinary: Negative for urgency, frequency, flank pain and difficulty urinating.  Musculoskeletal: Negative for myalgias, back pain, arthralgias and gait problem.  Neurological: Negative for dizziness, tremors, weakness and headaches.  Hematological: Negative for adenopathy.  Psychiatric/Behavioral: Negative for behavioral problems and dysphoric mood.       Objective:   Physical Exam Vitals reviewed Gen'l - WNWD WW in no distress Chest - lungs are clear Extremities- left leg is swollen, a little shiny, tender to palpation at the calve, positive Homan's sign, nl DP pulse and nl color.       Assessment & Plan:  1. Swollen left leg - patient with significant swelling and tenderness suggestive of DVT  Plan - urgent LE venous doppler - left.  Addendum - Doppler report reviewed: no DVT, findings c/w ruptured Baker's cyst.  Plan - elevation of leg, heat,  time.

## 2010-07-22 ENCOUNTER — Telehealth: Payer: Self-pay | Admitting: *Deleted

## 2010-07-22 NOTE — Telephone Encounter (Signed)
For U/S confirmed ruptured Baker's cyst it is usually going to resolve over time. For marked pain can refer to orthopedics to determine if there is a need for any intervention. If she wishes evaluation we can try to get same day appointment tomorrow.

## 2010-07-22 NOTE — Telephone Encounter (Signed)
Pt continues to c/o leg pain and swelling. What do you advise now?

## 2010-07-22 NOTE — Telephone Encounter (Signed)
Spoke w/pt - She has almost same level of pain as at OV 9 days ago. If she keeps leg elevated most of day she is "fine". 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 She will continue to rest and take tylenol but wants to know how much longer to wait?   Also - does have ortho Dr Preston Fleeting.

## 2010-07-23 NOTE — Telephone Encounter (Signed)
Left detailed vm for pt.

## 2010-07-23 NOTE — Telephone Encounter (Signed)
Per MD - If not better Monday will do ortho referral.

## 2010-09-14 ENCOUNTER — Other Ambulatory Visit: Payer: Self-pay | Admitting: Neurosurgery

## 2010-09-14 DIAGNOSIS — M479 Spondylosis, unspecified: Secondary | ICD-10-CM

## 2010-09-14 DIAGNOSIS — M4310 Spondylolisthesis, site unspecified: Secondary | ICD-10-CM

## 2010-09-14 DIAGNOSIS — M7130 Other bursal cyst, unspecified site: Secondary | ICD-10-CM

## 2010-09-14 DIAGNOSIS — M542 Cervicalgia: Secondary | ICD-10-CM

## 2010-09-23 ENCOUNTER — Telehealth: Payer: Self-pay | Admitting: *Deleted

## 2010-09-23 MED ORDER — CIPROFLOXACIN HCL 250 MG PO TABS: 250.0000 mg | ORAL_TABLET | Freq: Two times a day (BID) | ORAL | Status: AC

## 2010-09-23 NOTE — Telephone Encounter (Signed)
Ok for cipro 250mg  bid x 5 days,m #10

## 2010-09-23 NOTE — Telephone Encounter (Signed)
Pt is traveling home form out of town and states she has a UTI. She says she gets them all the time and is usually prescribed cipro. Pt states she cannot come in. Although she can come in next week for a urine specimen to see if it has cleared up. Can we send medication for pt . Pt uses [facility].@ [address]. Please Advise

## 2010-09-27 ENCOUNTER — Ambulatory Visit
Admission: RE | Admit: 2010-09-27 | Discharge: 2010-09-27 | Disposition: A | Discharge status: Home / Self Care | Payer: Medicare Other | Source: Ambulatory Visit | Attending: Neurosurgery | Admitting: Neurosurgery

## 2010-09-27 DIAGNOSIS — M7130 Other bursal cyst, unspecified site: Secondary | ICD-10-CM

## 2010-09-27 DIAGNOSIS — M479 Spondylosis, unspecified: Secondary | ICD-10-CM

## 2010-09-27 DIAGNOSIS — M4310 Spondylolisthesis, site unspecified: Secondary | ICD-10-CM

## 2010-09-27 DIAGNOSIS — M542 Cervicalgia: Secondary | ICD-10-CM

## 2010-09-27 MED ORDER — GADOBENATE DIMEGLUMINE 529 MG/ML IV SOLN
17.0000 mL | Freq: Once | INTRAVENOUS | Status: AC | PRN
  Administered 2010-09-27: 17 mL via INTRAVENOUS

## 2010-10-25 ENCOUNTER — Ambulatory Visit (INDEPENDENT_AMBULATORY_CARE_PROVIDER_SITE_OTHER): Payer: Medicare Other | Admitting: Internal Medicine

## 2010-10-25 ENCOUNTER — Encounter: Payer: Self-pay | Admitting: Internal Medicine

## 2010-10-25 VITALS — BP 118/62 | HR 86 | Temp 98.7°F | Wt 182.0 lb

## 2010-10-25 DIAGNOSIS — M549 Dorsalgia, unspecified: Secondary | ICD-10-CM

## 2010-10-25 DIAGNOSIS — M545 Low back pain, unspecified: Secondary | ICD-10-CM | POA: Insufficient documentation

## 2010-10-25 DIAGNOSIS — G459 Transient cerebral ischemic attack, unspecified: Secondary | ICD-10-CM

## 2010-10-25 MED ORDER — CELECOXIB 200 MG PO CAPS: 200.0000 mg | ORAL_CAPSULE | Freq: Every day | ORAL | Status: DC

## 2010-10-25 NOTE — Progress Notes (Signed)
  Subjective:    Patient ID: Gina [name], female    DOB: 1932-01-29, 77 y.o.   MRN: [id number]  HPI Gina [name] has had a stroke in the past and continues on plavix - now out about 1 year with no recurrent events. She has had a serious drug reaction in the past to ASA - wqith urticaria and angioedema, although this was concurrent with penicillin administration. She had a cyst removed from her spine but is not having recurrent back pain. She is wearing a back brace. Dr. Newell Coral is wanting to consider use of NSAID but is concerned about concurrent use of plavix.   Patient is also due for a breast exam.   I have reviewed the patient's medical history in detail and updated the computerized patient record.     Review of Systems System review is negative for any constitutional, cardiac, pulmonary, GI or neuro symptoms or complaints      Objective:   Physical Exam Vitals noted - stable BP Gen'l - overweight white woman in no distress HEENT - C&S clear Chest - CTAP, mild kyphosis Cor - 2+ radial pulse, RRR Breast exam - skin normal, nipples without discharge, no fixed mass or lesion, no palpable abnormality. No axillary mass       Assessment & Plan:

## 2010-10-25 NOTE — Assessment & Plan Note (Signed)
No recurrent events. She will continue on plavix at this time since she may have an angioedema reaction to aspirin. Also, continued risk reduction: good BP control attained and excellent control of lipid panel with last LDL less than 70.

## 2010-10-25 NOTE — Assessment & Plan Note (Signed)
Patient with increasing back pain but no radicular complaints. Discussed the risk of using NSAIDs while on plavix. However, being aspirin intolerant we are reticient to leave her without anti-platelet protection.  Plan - celebrex 200 mg daily - aware of risk, though lower, of gastric irritation and possible GI bleed.            OTC H2 blocker of choice BID to further reduce risk of GI bleed.

## 2010-11-11 ENCOUNTER — Other Ambulatory Visit: Payer: Self-pay | Admitting: Internal Medicine

## 2010-11-11 ENCOUNTER — Other Ambulatory Visit: Payer: Medicare Other

## 2010-11-11 DIAGNOSIS — Z1289 Encounter for screening for malignant neoplasm of other sites: Secondary | ICD-10-CM

## 2010-11-11 LAB — HEMOCCULT SLIDES (X 3 CARDS)
Fecal Occult Blood: POSITIVE
OCCULT 1: POSITIVE
OCCULT 2: POSITIVE
OCCULT 3: POSITIVE
OCCULT 4: POSITIVE
OCCULT 5: POSITIVE

## 2010-11-12 ENCOUNTER — Telehealth: Payer: Self-pay | Admitting: Internal Medicine

## 2010-11-12 NOTE — Telephone Encounter (Signed)
Stool cards all positive. Please call patient - any signs of GI bleeding? Will need to stop the celebrex.

## 2010-11-12 NOTE — Telephone Encounter (Signed)
Spoke with pt she states she is not having any signs of GI bleeding (no blood in stool, no weakness or fatigue) also informed to stop celebrex

## 2010-11-22 ENCOUNTER — Ambulatory Visit (INDEPENDENT_AMBULATORY_CARE_PROVIDER_SITE_OTHER): Payer: Medicare Other | Admitting: *Deleted

## 2010-11-22 ENCOUNTER — Other Ambulatory Visit: Payer: Self-pay | Admitting: Internal Medicine

## 2010-11-22 DIAGNOSIS — Z23 Encounter for immunization: Secondary | ICD-10-CM

## 2010-11-29 ENCOUNTER — Telehealth: Payer: Self-pay | Admitting: *Deleted

## 2010-11-29 NOTE — Telephone Encounter (Signed)
Pt wanting a ref to GI due to post hemocult cards

## 2010-11-30 ENCOUNTER — Other Ambulatory Visit: Payer: Self-pay | Admitting: Internal Medicine

## 2010-11-30 DIAGNOSIS — R195 Other fecal abnormalities: Secondary | ICD-10-CM

## 2010-11-30 NOTE — Telephone Encounter (Signed)
Spoke with pt she has seen Dr Juanda Chance in GI. I told her that I would not think she needs a ref for an appointment seeing that she is est with Dr Juanda Chance

## 2010-11-30 NOTE — Telephone Encounter (Signed)
Order entered

## 2010-12-28 ENCOUNTER — Encounter: Payer: Self-pay | Admitting: Internal Medicine

## 2010-12-28 ENCOUNTER — Ambulatory Visit (INDEPENDENT_AMBULATORY_CARE_PROVIDER_SITE_OTHER): Payer: Medicare Other | Admitting: Internal Medicine

## 2010-12-28 VITALS — BP 120/68 | HR 72 | Ht 62.0 in | Wt 182.8 lb

## 2010-12-28 DIAGNOSIS — R195 Other fecal abnormalities: Secondary | ICD-10-CM

## 2010-12-28 DIAGNOSIS — D689 Coagulation defect, unspecified: Secondary | ICD-10-CM

## 2010-12-28 MED ORDER — PEG-KCL-NACL-NASULF-NA ASC-C 100 G PO SOLR: 1.0000 | Freq: Once | ORAL | Status: DC

## 2010-12-28 NOTE — Patient Instructions (Signed)
You have been scheduled for a colonoscopy. Please follow written instructions given to you at your visit today.  Please pick up your prep kit at the pharmacy within the next 2-3 days. You will be contacted by our office prior to your procedure for directions on holding your Plavix.  If you do not hear from our office 1 week prior to your scheduled procedure, please call [phone] to discuss.  CC: Dr Illene Regulus

## 2010-12-28 NOTE — Progress Notes (Signed)
Gina [name] 1931/08/18 MRN [id number]   History of Present Illness:  This is a 77 year old white female with Hemoccult positive stool without apparent rectal bleeding. She denies abdominal pain. Her bowel habits have been regular. She had an acute GI bleed in April 2012 after taking a large amount of Aleve. The Aleve has been discontinued. She has been on Plavix after having a TIA in September 2011. Her last colonoscopy in September 2009 showed 2 hyperplastic polyps and moderately severe diverticulosis of the left colon. Her prior colonoscopy was in 1999 and in 2004 when she had a hyperplastic polyp removed. There is a positive family history of colorectal cancer in her father.   Past Medical History  Diagnosis Date  . Unspecified transient cerebral ischemia   . Hyperplastic colon polyp   . Diverticulosis of colon (without mention of hemorrhage)   . Personal history of other diseases of digestive system   . Hypertension   . Hyperlipidemia   . Gout   . Allergic rhinitis   . C. difficile colitis   . GI bleed    Past Surgical History  Procedure Date  . Breast lumpectomy     right breast '86  . Tonsillectomy   . Back surgery     reports that she has quit smoking. She quit smokeless tobacco use about 23 years ago. She reports that she drinks alcohol. She reports that she does not use illicit drugs. family history includes Breast cancer in her mother; Colon cancer in her father; Coronary artery disease in her father and mother; Heart failure in her mother; Hyperlipidemia in her father and mother; Hypertension in her father and mother; and Ulcers in her father. Allergies  Allergen Reactions  . Aspirin     REACTION: Hives  . Penicillins     REACTION: Hives        Review of Systems:  The remainder of the 10 point ROS is negative except as outlined in H&P   Physical Exam: General appearance  Well developed, in no distress. Eyes- non icteric. HEENT nontraumatic,  normocephalic. Mouth no lesions, tongue papillated, no cheilosis. Neck supple without adenopathy, thyroid not enlarged, no carotid bruits, no JVD. Lungs Clear to auscultation bilaterally. Cor normal S1, normal S2, regular rhythm, no murmur,  quiet precordium. Abdomen: Soft nontender abdomen with normal active bowel sounds. Mild tenderness left lower quadrant. No distention. Liver edge at costal margin. Rectal: Soft Hemoccult-positive stool. Extremities no pedal edema. Skin no lesions. Neurological alert and oriented x 3. Psychological normal mood and affect.  Assessment and Plan:  Problem #1 Hemoccult-positive stool on a home screening test confirmed on today's rectal exam. Possibilities include anorectal source of bleeding. We need to rule out a diverticular bleed, AVMs, or gastropathy. She is at high risk for colon cancer because of her family history and personal history of colon polyps. She is on Plavix. We will schedule a colonoscopy and ask Dr Debby Bud if it's okay to stop Plavix for 5 days prior to the procedure. She stopped her Plavix prior to her back surgery.   12/28/2010 [name]

## 2011-01-03 ENCOUNTER — Telehealth: Payer: Self-pay | Admitting: *Deleted

## 2011-01-03 NOTE — Telephone Encounter (Signed)
Pt needs clearance to come off plavix before colon procedure on 01/11/2011

## 2011-01-04 ENCOUNTER — Telehealth: Payer: Self-pay | Admitting: *Deleted

## 2011-01-04 NOTE — Telephone Encounter (Signed)
I spoke with patient and have advised her that per Dr Debby Bud, she should be at minimal risk to come off plavix x 5 days, although there is still a slight risk for recurrent CVA. Patient verbalizes understanding and states that she will hold plavix 5 days prior to test.

## 2011-01-04 NOTE — Telephone Encounter (Signed)
She is pretty far out from CVA and should be low risk (not no risk) for recurrent event if she comes of plavix for procedure - it is ok.

## 2011-01-04 NOTE — Telephone Encounter (Signed)
Informed dottie

## 2011-01-11 ENCOUNTER — Encounter: Payer: Self-pay | Admitting: Internal Medicine

## 2011-01-11 ENCOUNTER — Other Ambulatory Visit: Payer: Self-pay

## 2011-01-11 ENCOUNTER — Ambulatory Visit (AMBULATORY_SURGERY_CENTER): Payer: Medicare Other | Admitting: Internal Medicine

## 2011-01-11 ENCOUNTER — Other Ambulatory Visit (INDEPENDENT_AMBULATORY_CARE_PROVIDER_SITE_OTHER): Payer: Medicare Other

## 2011-01-11 VITALS — BP 123/64 | HR 85 | Temp 98.4°F | Resp 18 | Ht 62.0 in | Wt 182.0 lb

## 2011-01-11 DIAGNOSIS — C189 Malignant neoplasm of colon, unspecified: Secondary | ICD-10-CM

## 2011-01-11 DIAGNOSIS — K6389 Other specified diseases of intestine: Secondary | ICD-10-CM

## 2011-01-11 DIAGNOSIS — R195 Other fecal abnormalities: Secondary | ICD-10-CM

## 2011-01-11 DIAGNOSIS — Z86010 Personal history of colonic polyps: Secondary | ICD-10-CM

## 2011-01-11 DIAGNOSIS — Z1211 Encounter for screening for malignant neoplasm of colon: Secondary | ICD-10-CM

## 2011-01-11 DIAGNOSIS — Z800 Family history of malignant neoplasm of digestive organs: Secondary | ICD-10-CM

## 2011-01-11 LAB — CBC WITH DIFFERENTIAL/PLATELET
Basophils Absolute: 0 10*3/uL (ref 0.0–0.1)
Basophils Relative: 0.4 % (ref 0.0–3.0)
Eosinophils Absolute: 0.1 10*3/uL (ref 0.0–0.7)
Eosinophils Relative: 1.1 % (ref 0.0–5.0)
HCT: 26.2 % — ABNORMAL LOW (ref 36.0–46.0)
Hemoglobin: 8.3 g/dL — ABNORMAL LOW (ref 12.0–15.0)
Lymphocytes Relative: 15.3 % (ref 12.0–46.0)
Lymphs Abs: 1.1 10*3/uL (ref 0.7–4.0)
MCHC: 31.5 g/dL (ref 30.0–36.0)
MCV: 82 fl (ref 78.0–100.0)
Monocytes Absolute: 0.5 10*3/uL (ref 0.1–1.0)
Monocytes Relative: 6.7 % (ref 3.0–12.0)
Neutro Abs: 5.6 10*3/uL (ref 1.4–7.7)
Neutrophils Relative %: 76.5 % (ref 43.0–77.0)
Platelets: 380 10*3/uL (ref 150.0–400.0)
RBC: 3.2 Mil/uL — ABNORMAL LOW (ref 3.87–5.11)
RDW: 18 % — ABNORMAL HIGH (ref 11.5–14.6)
WBC: 7.4 10*3/uL (ref 4.5–10.5)

## 2011-01-11 LAB — COMPREHENSIVE METABOLIC PANEL
ALT: 16 U/L (ref 0–35)
AST: 23 U/L (ref 0–37)
Albumin: 3.5 g/dL (ref 3.5–5.2)
Alkaline Phosphatase: 80 U/L (ref 39–117)
BUN: 19 mg/dL (ref 6–23)
CO2: 24 mEq/L (ref 19–32)
Calcium: 8.8 mg/dL (ref 8.4–10.5)
Chloride: 110 mEq/L (ref 96–112)
Creatinine, Ser: 1 mg/dL (ref 0.4–1.2)
GFR: 59.81 mL/min — ABNORMAL LOW (ref >60.00)
Glucose, Bld: 103 mg/dL — ABNORMAL HIGH (ref 70–99)
Potassium: 3.9 mEq/L (ref 3.5–5.1)
Sodium: 142 mEq/L (ref 135–145)
Total Bilirubin: 0.4 mg/dL (ref 0.3–1.2)
Total Protein: 6.8 g/dL (ref 6.0–8.3)

## 2011-01-11 MED ORDER — SODIUM CHLORIDE 0.9 % IV SOLN: 500.0000 mL | INTRAVENOUS | Status: DC

## 2011-01-11 NOTE — Patient Instructions (Addendum)
HOLD PLAVIX FOR NOW  CT SCAN TOMORROW AT [facility] ON [address] 11/28 @ 2:00.  BE THERE AT 1:45. *FOLLOW WRITTEN  INSTRUCTIONS FOR DRINKING CONTRAST.  APPT WITH DR Juanda Chance Thursday 11/29 @945   LABS TODAY  SEE GREEN AND BLUE SHEETS FOR ADDITIONAL D/C INSTRUCTIONS

## 2011-01-11 NOTE — Progress Notes (Signed)
Patient notified of CT scan ordered for tomorrow and will be notified of Surgical appt with Dr Corliss Skains 01/17/11 11:00

## 2011-01-11 NOTE — Progress Notes (Signed)
Patient did not experience any of the following events: a burn prior to discharge; a fall within the facility; wrong site/side/patient/procedure/implant event; or a hospital transfer or hospital admission upon discharge from the facility. (G8907) Patient did not have preoperative order for IV antibiotic SSI prophylaxis. ([id number])  

## 2011-01-12 ENCOUNTER — Ambulatory Visit (INDEPENDENT_AMBULATORY_CARE_PROVIDER_SITE_OTHER)
Admission: RE | Admit: 2011-01-12 | Discharge: 2011-01-12 | Disposition: A | Discharge status: Home / Self Care | Payer: Medicare Other | Source: Ambulatory Visit | Attending: Internal Medicine | Admitting: Internal Medicine

## 2011-01-12 ENCOUNTER — Telehealth: Payer: Self-pay

## 2011-01-12 DIAGNOSIS — C189 Malignant neoplasm of colon, unspecified: Secondary | ICD-10-CM

## 2011-01-12 LAB — CEA: CEA: 0.7 ng/mL (ref 0.0–5.0)

## 2011-01-12 MED ORDER — IOHEXOL 300 MG/ML  SOLN
100.0000 mL | Freq: Once | INTRAMUSCULAR | Status: AC | PRN
  Administered 2011-01-12: 100 mL via INTRAVENOUS

## 2011-01-12 NOTE — Telephone Encounter (Signed)

## 2011-01-13 ENCOUNTER — Encounter: Payer: Self-pay | Admitting: Internal Medicine

## 2011-01-13 ENCOUNTER — Ambulatory Visit (INDEPENDENT_AMBULATORY_CARE_PROVIDER_SITE_OTHER): Payer: Medicare Other | Admitting: Internal Medicine

## 2011-01-13 VITALS — BP 122/64 | HR 80 | Ht 62.0 in | Wt 178.0 lb

## 2011-01-13 DIAGNOSIS — C180 Malignant neoplasm of cecum: Secondary | ICD-10-CM

## 2011-01-13 NOTE — Progress Notes (Signed)
Gina [name] [birth year] MRN [id number]   History of Present Illness:  This is a 77 year old white female who was just diagnosed with colon cancer on a colonoscopy 2 days ago. She had a Hemoccult-positive stool but no other symptoms. A colonoscopy showed a constricting mass in the descending colon close to the cecum. Biopsies are conclusive for malignancy. Special stains are still pending as to whether this is a primary or metastatic carcinoma. Lymphoma has been ruled out. A CT scan of the abdomen showed a localized tumor and small lymph nodes surrounding the mesentery around the lesion. Her hemoglobin yesterday was 8.3. She still denies any symptoms. There is a history of TIA in September 2011. She has been on Plavix which was discontinued 5 days prior to the colonoscopy. She has not taken any Plavix since then. Her CEA level is 0.7. A prior colonoscopy was in September 2009 showed 2 hyperplastic polyps with moderate-to-severe diverticulosis. She has a family history of colon cancer in her father.   Past Medical History  Diagnosis Date  . Unspecified transient cerebral ischemia   . Hyperplastic colon polyp   . Diverticulosis of colon (without mention of hemorrhage)   . Personal history of other diseases of digestive system   . Hypertension   . Hyperlipidemia   . Gout   . Allergic rhinitis   . C. difficile colitis   . GI bleed   . Mass of colon 01-11-2011     Colonoscopy    Past Surgical History  Procedure Date  . Breast lumpectomy     right breast '86  . Tonsillectomy   . Back surgery     reports that she has quit smoking. She quit smokeless tobacco use about 23 years ago. She reports that she drinks about 8.4 ounces of alcohol per week. She reports that she does not use illicit drugs. family history includes Breast cancer in her mother; Colon cancer in her father; Coronary artery disease in her father and mother; Heart failure in her mother; Hyperlipidemia in her father and  mother; Hypertension in her father and mother; and Ulcers in her father. Allergies  Allergen Reactions  . Aspirin     REACTION: Hives  . Penicillins     REACTION: Hives        Review of Systems: Denies shortness of breath chest pain dysphagia odynophagia  The remainder of the 10 point ROS is negative except as outlined in H&P   Physical Exam: General appearance  Well developed, in no distress. Eyes- non icteric. HEENT nontraumatic, normocephalic. Mouth no lesions, tongue papillated, no cheilosis. Neck supple without adenopathy, thyroid not enlarged, no carotid bruits, no JVD. Lungs Clear to auscultation bilaterally. Cor normal S1, normal S2, regular rhythm, no murmur,  quiet precordium. Abdomen: Soft, nontender with normal active bowel sounds. Rectal: Not done. Extremities no pedal edema. Skin no lesions. Neurological alert and oriented x 3. Psychological normal mood and affect.  Assessment and Plan:  New diagnosis: Carcinoma of the right colon just distal to the cecum. Positive for lymphadenopathy around the cecal area. She had a normal CEA. She has an appointment with a surgeon for Monday, 01/17/2011 at 11 AM. We have discussed her diagnosis and treatment. The conclusive biopsy results are pending as to whether this this is primary or a metastatic tumor. She was given samples of iron supplements to start right away. She will likely need blood transfusions prior to surgery. She will hold her Plavix until a decision is made about her  surgery.  01/13/2011 Gina [name]

## 2011-01-13 NOTE — Patient Instructions (Signed)
Dr M.Tsuei, Dr Debby Bud

## 2011-01-15 DIAGNOSIS — C189 Malignant neoplasm of colon, unspecified: Secondary | ICD-10-CM

## 2011-01-15 HISTORY — DX: Malignant neoplasm of colon, unspecified: C18.9

## 2011-01-17 ENCOUNTER — Ambulatory Visit (INDEPENDENT_AMBULATORY_CARE_PROVIDER_SITE_OTHER): Payer: Medicare Other | Admitting: Surgery

## 2011-01-17 ENCOUNTER — Encounter: Payer: Self-pay | Admitting: Internal Medicine

## 2011-01-17 ENCOUNTER — Encounter (INDEPENDENT_AMBULATORY_CARE_PROVIDER_SITE_OTHER): Payer: Self-pay | Admitting: Surgery

## 2011-01-17 VITALS — BP 148/66 | HR 68 | Temp 97.8°F | Resp 18 | Ht 62.0 in | Wt 178.1 lb

## 2011-01-17 DIAGNOSIS — Z85038 Personal history of other malignant neoplasm of large intestine: Secondary | ICD-10-CM | POA: Insufficient documentation

## 2011-01-17 DIAGNOSIS — C182 Malignant neoplasm of ascending colon: Secondary | ICD-10-CM

## 2011-01-17 NOTE — Patient Instructions (Signed)
We will schedule your surgery today.  Clear liquids the day before surgery.

## 2011-01-17 NOTE — Progress Notes (Signed)
Patient ID: Gina [name], female   DOB: 29-Apr-1931, 77 y.o.   MRN: [id number]  Chief Complaint  Patient presents with  . New Evaluation    eval of colon cancer     HPI Gina [name] is a 77 y.o. female.  Referred by Dr. [name] for right colon cancer HPI The patient underwent lumbar spine surgery in April of this year and was on Aleve postoperatively. She was also on Plavix after a TIA in September 2011. She developed an acute GI bleed right after her spine surgery. The Aleve was stopped and her bleeding resolved. She has continued to see occasional blood and had heme-positive stool. Therefore she was referred by Dr. Debby Bud to Dr. Juanda Chance for evaluation. A colonoscopy showed a large suspicious mass just above the cecum. Biopsy showed poorly differentiated carcinoma. The patient underwent a CEA level which was normal at 0.7. A CT scan performed 11/28 showed a 4 cm circumferential mass in the ascending colon above the cecum consistent with the cancer. She has some small mildly prominent adjacent pericolonic mesenteric nodes suspicious for metastatic disease. The liver showed no sign of metastatic disease. The remainder of the abdomen was normal other than some sigmoid diverticulosis. She is now referred for surgical evaluation. She denies any abdominal pain other than some cramping with her bowel prep last week. Past Medical History  Diagnosis Date  . Unspecified transient cerebral ischemia   . Hyperplastic colon polyp   . Diverticulosis of colon (without mention of hemorrhage)   . Personal history of other diseases of digestive system   . Hypertension   . Hyperlipidemia   . Gout   . Allergic rhinitis   . C. difficile colitis   . GI bleed   . Mass of colon 01-11-2011     Colonoscopy   . Arthritis   . Cancer   . Stroke     Past Surgical History  Procedure Date  . Breast lumpectomy     right breast '86  . Tonsillectomy   . Back surgery     Family History  Problem  Relation Age of Onset  . Colon cancer Father   . Hypertension Father   . Hyperlipidemia Father   . Coronary artery disease Father   . Ulcers Father   . Heart failure Mother   . Coronary artery disease Mother   . Breast cancer Mother   . Hypertension Mother   . Hyperlipidemia Mother     Social History History  Substance Use Topics  . Smoking status: Former Smoker    Types: Cigarettes  . Smokeless tobacco: Former Neurosurgeon    Quit date: 02/15/1987  . Alcohol Use: 8.4 oz/week    14 Glasses of wine per week     Wine daily    Allergies  Allergen Reactions  . Aspirin     REACTION: Hives  . Penicillins     REACTION: Hives    Current Outpatient Prescriptions  Medication Sig Dispense Refill  . acetaminophen (TYLENOL) 500 MG tablet Take 500 mg by mouth every 6 (six) hours as needed.        [name] allopurinol (ZYLOPRIM) 100 MG tablet TAKE 1 TABLET ONCE DAILY  90 tablet  2  . amLODipine (NORVASC) 5 MG tablet TAKE 1 TABLET ONCE DAILY  90 tablet  2  . Ascorbic Acid (VITAMIN C) 100 MG tablet Take 500 mg by mouth daily.        [name] atorvastatin (LIPITOR) 40 MG tablet TAKE 1  TABLET ONCE DAILY  90 tablet  2  . Calcium Carbonate (CALCIUM 600 PO) Take by mouth daily.        . enalapril (VASOTEC) 10 MG tablet Take 10 mg by mouth daily.        . hydrochlorothiazide (MICROZIDE) 12.5 MG capsule TAKE 1 CAPSULE ONCE DAILY  90 capsule  2  . Iron-DSS-B12-FA-C-E-Cu-Biotin (HEMAX) 150-1 MG TABS Take by mouth daily.        . Loratadine 10 MG CAPS Take by mouth daily.        . MULTIPLE VITAMIN PO Take by mouth.        . Multiple Vitamins-Minerals (OCUVITE PRESERVISION) TABS Take by mouth 2 (two) times daily.          Review of Systems Review of Systems  Constitutional: Positive for fatigue. Negative for fever, chills and unexpected weight change.  HENT: Negative for hearing loss, congestion, sore throat, trouble swallowing and voice change.   Eyes: Negative for visual disturbance.  Respiratory: Negative for  cough and wheezing.   Cardiovascular: Negative for chest pain, palpitations and leg swelling.  Gastrointestinal: Positive for abdominal pain and blood in stool. Negative for nausea, vomiting, diarrhea, constipation, abdominal distention and anal bleeding.  Genitourinary: Negative for hematuria, vaginal bleeding and difficulty urinating.  Musculoskeletal: Positive for back pain. Negative for arthralgias.  Skin: Negative for rash and wound.  Neurological: Negative for seizures, syncope and headaches.  Hematological: Negative for adenopathy. Does not bruise/bleed easily.  Psychiatric/Behavioral: Negative for confusion.    Blood pressure 148/66, pulse 68, temperature 97.8 F (36.6 C), temperature source Temporal, resp. rate 18, height 5\' 2"  (1.575 m), weight 178 lb 2 oz (80.797 kg).  Physical Exam Physical Exam WDWN in NAD HEENT:  EOMI, sclera anicteric Neck:  No masses, no thyromegaly Lungs:  CTA bilaterally; normal respiratory effort CV:  Regular rate and rhythm; no murmurs Abd:  +bowel sounds, soft, non-tender, no masses Ext:  Well-perfused; no edema Skin:  Warm, dry; no sign of jaundice  Data Reviewed CT scan/ labs Hgb 8.3 on 11/27  Assessment    Right colon carcinoma Chronic iron deficiency anemia from right colon carcinoma    Plan    Laparoscopic-assisted right hemicolectomy The surgical procedure has been discussed with the patient.  Potential risks, benefits, alternative treatments, and expected outcomes have been explained.  All of the patient's questions at this time have been answered.  The likelihood of reaching the patient's treatment goal is good.  The patient understand the proposed surgical procedure and wishes to proceed. The patient might require transfusion during/ after surgery because of her current anemia.  She is currently on iron supplements.       Gina [name] K. 01/17/2011, 12:10 PM

## 2011-01-19 ENCOUNTER — Encounter (HOSPITAL_COMMUNITY): Payer: Self-pay | Admitting: Pharmacy Technician

## 2011-01-21 ENCOUNTER — Telehealth: Payer: Self-pay | Admitting: Hematology and Oncology

## 2011-01-21 NOTE — Telephone Encounter (Signed)
S/w pt re appt 02/23/2011 @ 10 am w/LO. Per 12/7 np pof 1st or 2nd wk in jan

## 2011-01-24 ENCOUNTER — Encounter (HOSPITAL_COMMUNITY): Payer: Self-pay

## 2011-01-24 ENCOUNTER — Encounter (HOSPITAL_COMMUNITY)
Admission: RE | Admit: 2011-01-24 | Discharge: 2011-01-24 | Disposition: A | Discharge status: Home / Self Care | Payer: Medicare Other | Source: Ambulatory Visit | Attending: Surgery | Admitting: Surgery

## 2011-01-24 LAB — CBC
HCT: 30 % — ABNORMAL LOW (ref 36.0–46.0)
Hemoglobin: 8.7 g/dL — ABNORMAL LOW (ref 12.0–15.0)
MCH: 24.9 pg — ABNORMAL LOW (ref 26.0–34.0)
MCHC: 29 g/dL — ABNORMAL LOW (ref 30.0–36.0)
MCV: 86 fL (ref 78.0–100.0)
Platelets: 349 10*3/uL (ref 150–400)
RBC: 3.49 MIL/uL — ABNORMAL LOW (ref 3.87–5.11)
RDW: 18.9 % — ABNORMAL HIGH (ref 11.5–15.5)
WBC: 9 10*3/uL (ref 4.0–10.5)

## 2011-01-24 LAB — SURGICAL PCR SCREEN
MRSA, PCR: NEGATIVE
Staphylococcus aureus: NEGATIVE

## 2011-01-24 LAB — BASIC METABOLIC PANEL
BUN: 19 mg/dL (ref 6–23)
CO2: 30 mEq/L (ref 19–32)
Calcium: 10.2 mg/dL (ref 8.4–10.5)
Chloride: 102 mEq/L (ref 96–112)
Creatinine, Ser: 0.87 mg/dL (ref 0.50–1.10)
GFR calc Af Amer: 73 mL/min — ABNORMAL LOW (ref >90)
GFR calc non Af Amer: 63 mL/min — ABNORMAL LOW (ref >90)
Glucose, Bld: 102 mg/dL — ABNORMAL HIGH (ref 70–99)
Potassium: 4.3 mEq/L (ref 3.5–5.1)
Sodium: 139 mEq/L (ref 135–145)

## 2011-01-24 NOTE — Pre-Procedure Instructions (Signed)
20 Gina [name]  01/24/2011   Your procedure is scheduled on:  Jan 26, 2011 Wednesday  Report to [facility] at 0800 AM.  Call this number if you have problems the morning of surgery: [phone]   Remember:   Do not eat food:After Midnight.  May have clear liquids: up to 4 Hours before arrival.  Clear liquids include soda, tea, black coffee, apple or grape juice, broth.  Take these medicines the morning of surgery with A SIP OF WATER: norvasc, loratadine   Do not wear jewelry, make-up or nail polish.  Do not wear lotions, powders, or perfumes. You may wear deodorant.  Do not shave 48 hours prior to surgery.  Do not bring valuables to the hospital.  Contacts, dentures or bridgework may not be worn into surgery.  Leave suitcase in the car. After surgery it may be brought to your room.  For patients admitted to the hospital, checkout time is 11:00 AM the day of discharge.   Patients discharged the day of surgery will not be allowed to drive home.  Name and phone number of your driver: na  Special Instructions: CHG Shower Use Special Wash: 1/2 bottle night before surgery and 1/2 bottle morning of surgery.   Please read over the following fact sheets that you were given: Pain Booklet, Blood Transfusion Information and Surgical Site Infection Prevention

## 2011-01-24 NOTE — Consult Note (Addendum)
Anesthesia:  Patient is a 77 year old female for right partial colectomy for colon CA scheduled for 01/26/11.  Her history includes acute GIB in April 2012 following lumbar surgery (she was taking NSAIDS + Plavix for a prior hx of CVA in September '11), HTN, HLD, gout, former smoker, right breast lumpectomy, and hx of diverticulosis and hyperplastic polyps.  Her H/H on 01/11/11 were 8.3/26.2.  She has been started on iron supplements.  I was asked to review her preoperative labs from today showing her H/H now at 8.7/30.  She has been off her Plavix since undergoing a colonoscopy in late November.  Dr. Corliss Skains has already ordered a T & C for 4 Units preoperatively.  I will also order a repeat H/H.  She will also need an EKG done on the day of surgery because her last EKG (showing NSR) done on 11/12/09 is > 50 year old.  An echo done 11/13/09 showed normal LV systolic function, EF 60-65%, grade 1 diastolic dysfunction, and calcified MR annulus.

## 2011-01-25 MED ORDER — SODIUM CHLORIDE 0.9 % IV SOLN
1.0000 g | INTRAVENOUS | Status: DC
  Filled 2011-01-25: qty 1

## 2011-01-25 MED ORDER — ALVIMOPAN 12 MG PO CAPS
12.0000 mg | ORAL_CAPSULE | Freq: Once | ORAL | Status: AC
  Administered 2011-01-26: 12 mg via ORAL
  Filled 2011-01-25: qty 1

## 2011-01-25 NOTE — H&P (Signed)
Chief Complaint   Patient presents with   .  New Evaluation       eval of colon cancer     HPI Gina [name] is a 77 y.o. female.  Referred by Dr. [name] for right colon cancer HPI The patient underwent lumbar spine surgery in April of this year and was on Aleve postoperatively. She was also on Plavix after a TIA in September 2011. She developed an acute GI bleed right after her spine surgery. The Aleve was stopped and her bleeding resolved. She has continued to see occasional blood and had heme-positive stool. Therefore she was referred by Dr. Debby Bud to Dr. Juanda Chance for evaluation. A colonoscopy showed a large suspicious mass just above the cecum. Biopsy showed poorly differentiated carcinoma. The patient underwent a CEA level which was normal at 0.7. A CT scan performed 11/28 showed a 4 cm circumferential mass in the ascending colon above the cecum consistent with the cancer. She has some small mildly prominent adjacent pericolonic mesenteric nodes suspicious for metastatic disease. The liver showed no sign of metastatic disease. The remainder of the abdomen was normal other than some sigmoid diverticulosis. She is now referred for surgical evaluation. She denies any abdominal pain other than some cramping with her bowel prep last week. Past Medical History   Diagnosis  Date   .  Unspecified transient cerebral ischemia     .  Hyperplastic colon polyp     .  Diverticulosis of colon (without mention of hemorrhage)     .  Personal history of other diseases of digestive system     .  Hypertension     .  Hyperlipidemia     .  Gout     .  Allergic rhinitis     .  C. difficile colitis     .  GI bleed     .  Mass of colon  01-11-2011        Colonoscopy    .  Arthritis     .  Cancer     .  Stroke           Past Surgical History   Procedure  Date   .  Breast lumpectomy         right breast '86   .  Tonsillectomy     .  Back surgery           Family History   Problem   Relation  Age of Onset   .  Colon cancer  Father     .  Hypertension  Father     .  Hyperlipidemia  Father     .  Coronary artery disease  Father     .  Ulcers  Father     .  Heart failure  Mother     .  Coronary artery disease  Mother     .  Breast cancer  Mother     .  Hypertension  Mother     .  Hyperlipidemia  Mother          Social History History   Substance Use Topics   .  Smoking status:  Former Smoker       Types:  Cigarettes   .  Smokeless tobacco:  Former Neurosurgeon       Quit date:  02/15/1987   .  Alcohol Use:  8.4 oz/week       14 Glasses of wine per week  Wine daily         Allergies   Allergen  Reactions   .  Aspirin         REACTION: Hives   .  Penicillins         REACTION: Hives         Current Outpatient Prescriptions   Medication  Sig  Dispense  Refill   .  acetaminophen (TYLENOL) 500 MG tablet  Take 500 mg by mouth every 6 (six) hours as needed.           [name]  allopurinol (ZYLOPRIM) 100 MG tablet  TAKE 1 TABLET ONCE DAILY   90 tablet   2   .  amLODipine (NORVASC) 5 MG tablet  TAKE 1 TABLET ONCE DAILY   90 tablet   2   .  Ascorbic Acid (VITAMIN C) 100 MG tablet  Take 500 mg by mouth daily.           [name]  atorvastatin (LIPITOR) 40 MG tablet  TAKE 1 TABLET ONCE DAILY   90 tablet   2   .  Calcium Carbonate (CALCIUM 600 PO)  Take by mouth daily.           .  enalapril (VASOTEC) 10 MG tablet  Take 10 mg by mouth daily.           .  hydrochlorothiazide (MICROZIDE) 12.5 MG capsule  TAKE 1 CAPSULE ONCE DAILY   90 capsule   2   .  Iron-DSS-B12-FA-C-E-Cu-Biotin (HEMAX) 150-1 MG TABS  Take by mouth daily.           .  Loratadine 10 MG CAPS  Take by mouth daily.           .  MULTIPLE VITAMIN PO  Take by mouth.           .  Multiple Vitamins-Minerals (OCUVITE PRESERVISION) TABS  Take by mouth 2 (two) times daily.                Review of Systems Review of Systems  Constitutional: Positive for fatigue. Negative for fever, chills and unexpected weight  change.  HENT: Negative for hearing loss, congestion, sore throat, trouble swallowing and voice change.   Eyes: Negative for visual disturbance.  Respiratory: Negative for cough and wheezing.   Cardiovascular: Negative for chest pain, palpitations and leg swelling.  Gastrointestinal: Positive for abdominal pain and blood in stool. Negative for nausea, vomiting, diarrhea, constipation, abdominal distention and anal bleeding.  Genitourinary: Negative for hematuria, vaginal bleeding and difficulty urinating.  Musculoskeletal: Positive for back pain. Negative for arthralgias.  Skin: Negative for rash and wound.  Neurological: Negative for seizures, syncope and headaches.  Hematological: Negative for adenopathy. Does not bruise/bleed easily.  Psychiatric/Behavioral: Negative for confusion.      Blood pressure 148/66, pulse 68, temperature 97.8 F (36.6 C), temperature source Temporal, resp. rate 18, height 5\' 2"  (1.575 m), weight 178 lb 2 oz (80.797 kg).   Physical Exam Physical Exam WDWN in NAD HEENT:  EOMI, sclera anicteric Neck:  No masses, no thyromegaly Lungs:  CTA bilaterally; normal respiratory effort CV:  Regular rate and rhythm; no murmurs Abd:  +bowel sounds, soft, non-tender, no masses Ext:  Well-perfused; no edema Skin:  Warm, dry; no sign of jaundice   Data Reviewed CT scan/ labs Hgb 8.3 on 11/27   Assessment    Right colon carcinoma Chronic iron deficiency anemia from right colon carcinoma     Plan  Laparoscopic-assisted right hemicolectomy The surgical procedure has been discussed with the patient.  Potential risks, benefits, alternative treatments, and expected outcomes have been explained.  All of the patient's questions at this time have been answered.  The likelihood of reaching the patient's treatment goal is good.  The patient understand the proposed surgical procedure and wishes to proceed. The patient might require transfusion during/ after surgery  because of her current anemia.  She is currently on iron supplements.          Wilmon Arms. Corliss Skains, MD, [facility] Surgery  01/25/2011 5:06 PM

## 2011-01-26 ENCOUNTER — Encounter (HOSPITAL_COMMUNITY)
Admission: RE | Disposition: A | Discharge status: Home / Self Care | Payer: Self-pay | Source: Ambulatory Visit | Attending: Surgery

## 2011-01-26 ENCOUNTER — Encounter (HOSPITAL_COMMUNITY): Payer: Self-pay | Admitting: Vascular Surgery

## 2011-01-26 ENCOUNTER — Inpatient Hospital Stay (HOSPITAL_COMMUNITY): Payer: Medicare Other | Admitting: Vascular Surgery

## 2011-01-26 ENCOUNTER — Other Ambulatory Visit: Payer: Self-pay

## 2011-01-26 ENCOUNTER — Other Ambulatory Visit (INDEPENDENT_AMBULATORY_CARE_PROVIDER_SITE_OTHER): Payer: Self-pay | Admitting: Surgery

## 2011-01-26 ENCOUNTER — Inpatient Hospital Stay (HOSPITAL_COMMUNITY)
Admission: RE | Admit: 2011-01-26 | Discharge: 2011-01-30 | DRG: 330 | Disposition: A | Discharge status: Home / Self Care | Payer: Medicare Other | Source: Ambulatory Visit | Attending: Surgery | Admitting: Surgery

## 2011-01-26 ENCOUNTER — Encounter (HOSPITAL_COMMUNITY): Payer: Self-pay | Admitting: *Deleted

## 2011-01-26 DIAGNOSIS — M129 Arthropathy, unspecified: Secondary | ICD-10-CM | POA: Diagnosis present

## 2011-01-26 DIAGNOSIS — E785 Hyperlipidemia, unspecified: Secondary | ICD-10-CM | POA: Diagnosis present

## 2011-01-26 DIAGNOSIS — I10 Essential (primary) hypertension: Secondary | ICD-10-CM | POA: Diagnosis present

## 2011-01-26 DIAGNOSIS — C189 Malignant neoplasm of colon, unspecified: Secondary | ICD-10-CM

## 2011-01-26 DIAGNOSIS — Z886 Allergy status to analgesic agent status: Secondary | ICD-10-CM

## 2011-01-26 DIAGNOSIS — Z7902 Long term (current) use of antithrombotics/antiplatelets: Secondary | ICD-10-CM

## 2011-01-26 DIAGNOSIS — Z860100 Personal history of colon polyps, unspecified: Secondary | ICD-10-CM

## 2011-01-26 DIAGNOSIS — Z8673 Personal history of transient ischemic attack (TIA), and cerebral infarction without residual deficits: Secondary | ICD-10-CM

## 2011-01-26 DIAGNOSIS — Z87891 Personal history of nicotine dependence: Secondary | ICD-10-CM

## 2011-01-26 DIAGNOSIS — J309 Allergic rhinitis, unspecified: Secondary | ICD-10-CM | POA: Diagnosis present

## 2011-01-26 DIAGNOSIS — M109 Gout, unspecified: Secondary | ICD-10-CM | POA: Diagnosis present

## 2011-01-26 DIAGNOSIS — Z880 Allergy status to penicillin: Secondary | ICD-10-CM

## 2011-01-26 DIAGNOSIS — D62 Acute posthemorrhagic anemia: Secondary | ICD-10-CM | POA: Diagnosis not present

## 2011-01-26 DIAGNOSIS — C182 Malignant neoplasm of ascending colon: Principal | ICD-10-CM | POA: Diagnosis present

## 2011-01-26 DIAGNOSIS — Z79899 Other long term (current) drug therapy: Secondary | ICD-10-CM

## 2011-01-26 LAB — HEMOGLOBIN AND HEMATOCRIT, BLOOD
HCT: 31.6 % — ABNORMAL LOW (ref 36.0–46.0)
Hemoglobin: 9.1 g/dL — ABNORMAL LOW (ref 12.0–15.0)

## 2011-01-26 LAB — PREPARE RBC (CROSSMATCH)

## 2011-01-26 SURGERY — LAPAROSCOPIC PARTIAL COLECTOMY
Anesthesia: General | Site: Abdomen | Laterality: Right | Wound class: Clean Contaminated

## 2011-01-26 MED ORDER — ALVIMOPAN 12 MG PO CAPS
12.0000 mg | ORAL_CAPSULE | Freq: Two times a day (BID) | ORAL | Status: DC
  Administered 2011-01-27 – 2011-01-30 (×7): 12 mg via ORAL
  Filled 2011-01-26 (×8): qty 1

## 2011-01-26 MED ORDER — HYDROCHLOROTHIAZIDE 12.5 MG PO CAPS
12.5000 mg | ORAL_CAPSULE | Freq: Every day | ORAL | Status: DC
  Administered 2011-01-27 – 2011-01-30 (×4): 12.5 mg via ORAL
  Filled 2011-01-26 (×4): qty 1

## 2011-01-26 MED ORDER — HEMAX 150-1 MG PO TABS: 1.0000 | ORAL_TABLET | Freq: Every day | ORAL | Status: DC

## 2011-01-26 MED ORDER — DEXAMETHASONE SODIUM PHOSPHATE 4 MG/ML IJ SOLN
INTRAMUSCULAR | Status: DC | PRN
  Administered 2011-01-26: 4 mg via INTRAVENOUS

## 2011-01-26 MED ORDER — KETOROLAC TROMETHAMINE 30 MG/ML IJ SOLN
INTRAMUSCULAR | Status: AC
  Filled 2011-01-26: qty 1

## 2011-01-26 MED ORDER — SIMVASTATIN 20 MG PO TABS
20.0000 mg | ORAL_TABLET | Freq: Every day | ORAL | Status: DC
  Administered 2011-01-26 – 2011-01-29 (×4): 20 mg via ORAL
  Filled 2011-01-26 (×5): qty 1

## 2011-01-26 MED ORDER — ACETAMINOPHEN 10 MG/ML IV SOLN
INTRAVENOUS | Status: AC
  Filled 2011-01-26: qty 100

## 2011-01-26 MED ORDER — NEOSTIGMINE METHYLSULFATE 1 MG/ML IJ SOLN
INTRAMUSCULAR | Status: DC | PRN
  Administered 2011-01-26: 5 mg via INTRAVENOUS

## 2011-01-26 MED ORDER — ONDANSETRON HCL 4 MG/2ML IJ SOLN: 4.0000 mg | Freq: Four times a day (QID) | INTRAMUSCULAR | Status: DC | PRN

## 2011-01-26 MED ORDER — LACTATED RINGERS IV SOLN
INTRAVENOUS | Status: DC | PRN
  Administered 2011-01-26: 10:00:00 via INTRAVENOUS

## 2011-01-26 MED ORDER — KCL IN DEXTROSE-NACL 20-5-0.45 MEQ/L-%-% IV SOLN
INTRAVENOUS | Status: AC
  Filled 2011-01-26: qty 1000

## 2011-01-26 MED ORDER — PHENYLEPHRINE HCL 10 MG/ML IJ SOLN
INTRAMUSCULAR | Status: DC | PRN
  Administered 2011-01-26 (×2): 80 ug via INTRAVENOUS

## 2011-01-26 MED ORDER — FENTANYL CITRATE 0.05 MG/ML IJ SOLN
INTRAMUSCULAR | Status: DC | PRN
  Administered 2011-01-26: 150 ug via INTRAVENOUS

## 2011-01-26 MED ORDER — ALLOPURINOL 100 MG PO TABS
100.0000 mg | ORAL_TABLET | Freq: Every day | ORAL | Status: DC
  Administered 2011-01-27 – 2011-01-30 (×4): 100 mg via ORAL
  Filled 2011-01-26 (×4): qty 1

## 2011-01-26 MED ORDER — LACTATED RINGERS IV SOLN
INTRAVENOUS | Status: DC
  Administered 2011-01-26: 09:00:00 via INTRAVENOUS

## 2011-01-26 MED ORDER — PROPOFOL 10 MG/ML IV EMUL
INTRAVENOUS | Status: DC | PRN
  Administered 2011-01-26: 100 mg via INTRAVENOUS

## 2011-01-26 MED ORDER — ENOXAPARIN SODIUM 40 MG/0.4ML ~~LOC~~ SOLN
40.0000 mg | SUBCUTANEOUS | Status: DC
  Administered 2011-01-27 – 2011-01-29 (×3): 40 mg via SUBCUTANEOUS
  Filled 2011-01-26 (×4): qty 0.4

## 2011-01-26 MED ORDER — PROMETHAZINE HCL 25 MG/ML IJ SOLN: 6.2500 mg | INTRAMUSCULAR | Status: DC | PRN

## 2011-01-26 MED ORDER — SODIUM CHLORIDE 0.9 % IR SOLN
Status: DC | PRN
  Administered 2011-01-26: 1000 mL

## 2011-01-26 MED ORDER — KCL IN DEXTROSE-NACL 20-5-0.45 MEQ/L-%-% IV SOLN
INTRAVENOUS | Status: DC
  Administered 2011-01-26 – 2011-01-28 (×4): via INTRAVENOUS
  Filled 2011-01-26 (×7): qty 1000

## 2011-01-26 MED ORDER — AMLODIPINE BESYLATE 5 MG PO TABS
5.0000 mg | ORAL_TABLET | Freq: Every day | ORAL | Status: DC
  Administered 2011-01-27 – 2011-01-30 (×4): 5 mg via ORAL
  Filled 2011-01-26 (×4): qty 1

## 2011-01-26 MED ORDER — ENALAPRIL MALEATE 10 MG PO TABS
10.0000 mg | ORAL_TABLET | Freq: Every day | ORAL | Status: DC
  Administered 2011-01-27 – 2011-01-30 (×4): 10 mg via ORAL
  Filled 2011-01-26 (×4): qty 1

## 2011-01-26 MED ORDER — BUPIVACAINE-EPINEPHRINE (PF) 0.25% -1:200000 IJ SOLN
INTRAMUSCULAR | Status: DC | PRN
  Administered 2011-01-26: 23 mL

## 2011-01-26 MED ORDER — HYDROMORPHONE HCL PF 1 MG/ML IJ SOLN
0.2500 mg | INTRAMUSCULAR | Status: DC | PRN
  Administered 2011-01-26 (×3): 0.5 mg via INTRAVENOUS

## 2011-01-26 MED ORDER — FE FUMARATE-B12-VIT C-FA-IFC PO CAPS
1.0000 | ORAL_CAPSULE | Freq: Every day | ORAL | Status: DC
  Administered 2011-01-26 – 2011-01-30 (×5): 1 via ORAL
  Filled 2011-01-26 (×5): qty 1

## 2011-01-26 MED ORDER — ONDANSETRON HCL 4 MG/2ML IJ SOLN
INTRAMUSCULAR | Status: DC | PRN
  Administered 2011-01-26: 4 mg via INTRAVENOUS

## 2011-01-26 MED ORDER — KETOROLAC TROMETHAMINE 15 MG/ML IJ SOLN
30.0000 mg | Freq: Four times a day (QID) | INTRAMUSCULAR | Status: DC
  Administered 2011-01-26 – 2011-01-27 (×4): 30 mg via INTRAVENOUS
  Filled 2011-01-26: qty 2
  Filled 2011-01-26 (×2): qty 1
  Filled 2011-01-26 (×4): qty 2
  Filled 2011-01-26: qty 1

## 2011-01-26 MED ORDER — GLYCOPYRROLATE 0.2 MG/ML IJ SOLN
INTRAMUSCULAR | Status: DC | PRN
  Administered 2011-01-26: .6 mg via INTRAVENOUS

## 2011-01-26 MED ORDER — HYDROMORPHONE HCL PF 1 MG/ML IJ SOLN
INTRAMUSCULAR | Status: AC
  Filled 2011-01-26: qty 1

## 2011-01-26 MED ORDER — MEPERIDINE HCL 25 MG/ML IJ SOLN: 6.2500 mg | INTRAMUSCULAR | Status: DC | PRN

## 2011-01-26 MED ORDER — HYDROMORPHONE HCL PF 1 MG/ML IJ SOLN
0.5000 mg | INTRAMUSCULAR | Status: DC | PRN
  Administered 2011-01-26 – 2011-01-27 (×2): 1 mg via INTRAVENOUS
  Filled 2011-01-26 (×2): qty 1

## 2011-01-26 MED ORDER — ONDANSETRON HCL 4 MG PO TABS: 4.0000 mg | ORAL_TABLET | Freq: Four times a day (QID) | ORAL | Status: DC | PRN

## 2011-01-26 MED ORDER — SODIUM CHLORIDE 0.9 % IV SOLN
1.0000 g | INTRAVENOUS | Status: AC
  Administered 2011-01-26 (×2): 1 g via INTRAVENOUS
  Filled 2011-01-26 (×2): qty 1

## 2011-01-26 MED ORDER — ROCURONIUM BROMIDE 100 MG/10ML IV SOLN
INTRAVENOUS | Status: DC | PRN
  Administered 2011-01-26: 50 mg via INTRAVENOUS
  Administered 2011-01-26: 10 mg via INTRAVENOUS

## 2011-01-26 SURGICAL SUPPLY — 76 items
APPLIER CLIP 5 13 M/L LIGAMAX5 (MISCELLANEOUS)
APPLIER CLIP ROT 10 11.4 M/L (STAPLE)
BENZOIN TINCTURE PRP APPL 2/3 (GAUZE/BANDAGES/DRESSINGS) ×2 IMPLANT
BLADE SURG 10 STRL SS (BLADE) ×2 IMPLANT
BLADE SURG ROTATE 9660 (MISCELLANEOUS) IMPLANT
CANISTER SUCTION 2500CC (MISCELLANEOUS) ×2 IMPLANT
CELLS DAT CNTRL 66122 CELL SVR (MISCELLANEOUS) IMPLANT
CHLORAPREP W/TINT 26ML (MISCELLANEOUS) ×2 IMPLANT
CLIP APPLIE 5 13 M/L LIGAMAX5 (MISCELLANEOUS) IMPLANT
CLIP APPLIE ROT 10 11.4 M/L (STAPLE) IMPLANT
CLOTH BEACON ORANGE TIMEOUT ST (SAFETY) ×2 IMPLANT
COVER SURGICAL LIGHT HANDLE (MISCELLANEOUS) ×2 IMPLANT
DECANTER SPIKE VIAL GLASS SM (MISCELLANEOUS) IMPLANT
DRAPE PROXIMA HALF (DRAPES) IMPLANT
DRAPE UTILITY 15X26 W/TAPE STR (DRAPE) ×4 IMPLANT
DRSG TEGADERM 2.38X2.75 (GAUZE/BANDAGES/DRESSINGS) ×2 IMPLANT
DRSG TEGADERM 4X4.75 (GAUZE/BANDAGES/DRESSINGS) ×2 IMPLANT
ELECT CAUTERY BLADE 6.4 (BLADE) ×4 IMPLANT
ELECT REM PT RETURN 9FT ADLT (ELECTROSURGICAL) ×2
ELECTRODE REM PT RTRN 9FT ADLT (ELECTROSURGICAL) ×1 IMPLANT
FILTER SMOKE EVAC LAPAROSHD (FILTER) ×2 IMPLANT
GAUZE SPONGE 2X2 8PLY STRL LF (GAUZE/BANDAGES/DRESSINGS) ×1 IMPLANT
GEL ULTRASOUND 20GR AQUASONIC (MISCELLANEOUS) IMPLANT
GLOVE BIO SURGEON STRL SZ7 (GLOVE) ×4 IMPLANT
GLOVE BIOGEL PI IND STRL 6.5 (GLOVE) ×1 IMPLANT
GLOVE BIOGEL PI IND STRL 7.0 (GLOVE) ×2 IMPLANT
GLOVE BIOGEL PI IND STRL 7.5 (GLOVE) ×2 IMPLANT
GLOVE BIOGEL PI INDICATOR 6.5 (GLOVE) ×1
GLOVE BIOGEL PI INDICATOR 7.0 (GLOVE) ×2
GLOVE BIOGEL PI INDICATOR 7.5 (GLOVE) ×2
GLOVE ECLIPSE 6.5 STRL STRAW (GLOVE) ×2 IMPLANT
GLOVE ECLIPSE 7.5 STRL STRAW (GLOVE) ×6 IMPLANT
GLOVE EUDERMIC 7 POWDERFREE (GLOVE) ×4 IMPLANT
GOWN STRL NON-REIN LRG LVL3 (GOWN DISPOSABLE) ×8 IMPLANT
KIT BASIN OR (CUSTOM PROCEDURE TRAY) ×2 IMPLANT
KIT ROOM TURNOVER OR (KITS) ×2 IMPLANT
LEGGING LITHOTOMY PAIR STRL (DRAPES) IMPLANT
LIGASURE 5MM LAPAROSCOPIC (INSTRUMENTS) IMPLANT
LIGASURE IMPACT 36 18CM CVD LR (INSTRUMENTS) ×2 IMPLANT
NS IRRIG 1000ML POUR BTL (IV SOLUTION) ×4 IMPLANT
PAD ARMBOARD 7.5X6 YLW CONV (MISCELLANEOUS) ×4 IMPLANT
PENCIL BUTTON HOLSTER BLD 10FT (ELECTRODE) ×2 IMPLANT
RELOAD PROXIMATE 75MM BLUE (ENDOMECHANICALS) ×4 IMPLANT
RTRCTR WOUND ALEXIS 18CM MED (MISCELLANEOUS)
SCALPEL HARMONIC ACE (MISCELLANEOUS) ×2 IMPLANT
SCISSORS LAP 5X35 DISP (ENDOMECHANICALS) IMPLANT
SET IRRIG TUBING LAPAROSCOPIC (IRRIGATION / IRRIGATOR) ×2 IMPLANT
SLEEVE ENDOPATH XCEL 5M (ENDOMECHANICALS) ×2 IMPLANT
SPECIMEN JAR LARGE (MISCELLANEOUS) ×2 IMPLANT
SPONGE GAUZE 2X2 STER 10/PKG (GAUZE/BANDAGES/DRESSINGS) ×1
SPONGE GAUZE 4X4 12PLY (GAUZE/BANDAGES/DRESSINGS) ×2 IMPLANT
STAPLER GUN LINEAR PROX 60 (STAPLE) ×2 IMPLANT
STAPLER PROXIMATE 75MM BLUE (STAPLE) ×2 IMPLANT
STAPLER VISISTAT 35W (STAPLE) ×2 IMPLANT
STRIP CLOSURE SKIN 1/2X4 (GAUZE/BANDAGES/DRESSINGS) ×2 IMPLANT
SURGILUBE 2OZ TUBE FLIPTOP (MISCELLANEOUS) IMPLANT
SUT MNCRL AB 4-0 PS2 18 (SUTURE) ×2 IMPLANT
SUT PDS AB 1 TP1 96 (SUTURE) ×4 IMPLANT
SUT PROLENE 2 0 CT2 30 (SUTURE) IMPLANT
SUT PROLENE 2 0 KS (SUTURE) IMPLANT
SUT SILK 2 0 SH CR/8 (SUTURE) ×2 IMPLANT
SUT SILK 2 0 TIES 10X30 (SUTURE) ×2 IMPLANT
SUT SILK 3 0 SH CR/8 (SUTURE) ×2 IMPLANT
SUT SILK 3 0 TIES 10X30 (SUTURE) ×2 IMPLANT
SYS LAPSCP GELPORT 120MM (MISCELLANEOUS) ×2
SYSTEM LAPSCP GELPORT 120MM (MISCELLANEOUS) ×1 IMPLANT
TOWEL OR 17X24 6PK STRL BLUE (TOWEL DISPOSABLE) ×2 IMPLANT
TOWEL OR 17X26 10 PK STRL BLUE (TOWEL DISPOSABLE) ×2 IMPLANT
TRAY FOLEY CATH 14FRSI W/METER (CATHETERS) ×2 IMPLANT
TRAY LAPAROSCOPIC (CUSTOM PROCEDURE TRAY) ×2 IMPLANT
TRAY PROCTOSCOPIC FIBER OPTIC (SET/KITS/TRAYS/PACK) IMPLANT
TROCAR XCEL NON-BLD 11X100MML (ENDOMECHANICALS) IMPLANT
TROCAR XCEL NON-BLD 5MMX100MML (ENDOMECHANICALS) ×2 IMPLANT
TUBE CONNECTING 12X1/4 (SUCTIONS) ×2 IMPLANT
WATER STERILE IRR 1000ML POUR (IV SOLUTION) IMPLANT
YANKAUER SUCT BULB TIP NO VENT (SUCTIONS) ×2 IMPLANT

## 2011-01-26 NOTE — Anesthesia Preprocedure Evaluation (Signed)
Anesthesia Evaluation  Patient identified by MRN, date of birth, ID band Patient awake    Reviewed: Allergy & Precautions, H&P , NPO status , Patient's Chart, lab work & pertinent test results  Airway Mallampati: III TM Distance: >3 FB Neck ROM: Full    Dental  (+) Teeth Intact and Caps   Pulmonary  clear to auscultation        Cardiovascular hypertension, Pt. on medications Regular Normal    Neuro/Psych No Residual Symptoms    GI/Hepatic Neg liver ROS, Colon CA   Endo/Other  Negative Endocrine ROS  Renal/GU negative Renal ROS  Genitourinary negative   Musculoskeletal negative musculoskeletal ROS (+)   Abdominal (+) obese,   Peds  Hematology negative hematology ROS (+)   Anesthesia Other Findings   Reproductive/Obstetrics                           Anesthesia Physical Anesthesia Plan  ASA: III  Anesthesia Plan: General   Post-op Pain Management:    Induction: Intravenous  Airway Management Planned: Oral ETT  Additional Equipment:   Intra-op Plan:   Post-operative Plan: Extubation in OR  Informed Consent: I have reviewed the patients History and Physical, chart, labs and discussed the procedure including the risks, benefits and alternatives for the proposed anesthesia with the patient or authorized representative who has indicated his/her understanding and acceptance.     Plan Discussed with: CRNA  Anesthesia Plan Comments:         Anesthesia Quick Evaluation

## 2011-01-26 NOTE — Anesthesia Postprocedure Evaluation (Signed)
  Anesthesia Post-op Note  Patient: Gina [name]  Procedure(s) Performed:  LAPAROSCOPIC PARTIAL COLECTOMY - Laparoscopic hand-assisted right hemicolectomy  Patient Location: PACU  Anesthesia Type: General  Level of Consciousness: awake and alert   Airway and Oxygen Therapy: Patient Spontanous Breathing and Patient connected to nasal cannula oxygen  Post-op Pain: mild  Post-op Assessment: Post-op Vital signs reviewed, Patient's Cardiovascular Status Stable, Respiratory Function Stable, Patent Airway and No signs of Nausea or vomiting  Post-op Vital Signs: Reviewed and stable  Complications: No apparent anesthesia complications

## 2011-01-26 NOTE — Interval H&P Note (Signed)
History and Physical Interval Note:  01/26/2011 9:57 AM  Gina [name]  has presented today for surgery, with the diagnosis of Right colon carcinoma  The various methods of treatment have been discussed with the patient and family. After consideration of risks, benefits and other options for treatment, the patient has consented to  Procedure(s): LAPAROSCOPIC PARTIAL COLECTOMY as a surgical intervention .  The patients' history has been reviewed, patient examined, no change in status, stable for surgery.  I have reviewed the patients' chart and labs.  Questions were answered to the patient's satisfaction.     Adelina Collard K.

## 2011-01-26 NOTE — Op Note (Signed)
Preop diagnosis: Right colon cancer Postop diagnosis: Same Procedure performed: Laparoscopic assisted right hemicolectomy Surgeon:Taira Knabe K. Assistant: Jamey Ripa Anesthesia: Gen. endotracheal Indications: This is a 77 year old female who is several months status post lumbar surgery. In her postoperative course she developed a GI bleed which resolved after stopping her nonsteroidal anti-inflammatory medications. Several months later she underwent a colonoscopy which showed a large circumferential tumor in the descending colon above the cecum. She has also been quite anemic but has not yet required transfusion. She is on iron supplements. Biopsy confirmed adenocarcinoma. She presents now for resection.  Description of procedure: The patient was brought to the operating room and placed in the supine position on the operative table with her arms tucked. After an adequate level of general anesthesia was obtained a Foley catheter was placed under sterile technique. Her abdomen was then prepped with ChloraPrep and draped in sterile fashion. A timeout was taken to ensure the proper patient and proper procedure. A 5 mm Optiview trocar was used to cannulate the left upper quadrant using direct visualization. We insufflated CO2 maintaining a maximum pressure of 15 mm Hg. We used a 5 mm 30 laparoscope. A another 5 mm port was placed in the subxiphoid position. A Glassman clamp was inserted. The cecum was immediately visualized and was very mobile. We could actually immediately visualized the tumor which was just above the cecum. The entire right colon seems fairly mobile. We then made a 7 cm vertical midline incision around the umbilicus. We inserted the GelPort device. I inserted my left hand and we mobilized the cecum and the ascending colon using cautery. The terminal ileum was easily mobilized as well. I cannot palpate any nodules in the peritoneum. I did not palpate any enlarged lymph nodes. We continued  mobilized the colon around the hepatic flexure until we were at the mid transverse colon. The cecum was quite mobile. We then released our insufflation and I exteriorized the colon and terminal ileum. We then performed our resection with GIA staplers taking the distal 10 cm of, ileum and dividing the transverse colon just to the right of the middle colic artery. We left some omentum densely adherent to the tumor. We divided the omentum with the LigaSure device. The mesentery the colon was divided with the LigaSure device. We oversewed the right branch of the middle colic artery and the ileocolic vessel with 2-0 silk. We then created a side-to-side anastomosis between the terminal ileum and the transverse colon using a GIA 75 stapler. We inspected the staple line which was hemostatic. We then closed the enterotomy with a TA 60 stapler. A reinforcing crotch suture of 3-0 silk was placed. The mesenteric defect was closed with 2-0 silk sutures. We then placed the anastomosis back in the peritoneal cavity with omentum laying over the anastomosis. We replace the GelPort device and reinsufflated CO2. We inspected carefully for hemostasis which was good. We irrigated thoroughly. We then remove the GelPort device and our trochars as pneumoperitoneum was released. We closed the fascia at the midline incision with double-stranded #1 PDS. The subcutaneous tissues were irrigated. 4-0 Monocryl was used to close the skin incisions. We had infiltrated previously with quarter percent Marcaine with epinephrine. Steri-Strips and clean dressings were applied. The patient was extubated and brought to the recovery room in stable condition. All sponge, instrument, and needle counts are correct.  Wilmon Arms. Corliss Skains, MD, [facility]  01/26/2011 12:21 PM

## 2011-01-26 NOTE — Transfer of Care (Signed)
Immediate Anesthesia Transfer of Care Note  Patient: Gina [name]  Procedure(s) Performed:  LAPAROSCOPIC PARTIAL COLECTOMY - Laparoscopic hand-assisted right hemicolectomy  Patient Location: PACU  Anesthesia Type: General  Level of Consciousness: awake  Airway & Oxygen Therapy: Patient Spontanous Breathing  Post-op Assessment: Report given to PACU RN  Post vital signs: Reviewed and stable  Complications: No apparent anesthesia complications

## 2011-01-27 LAB — CBC
HCT: 24.9 % — ABNORMAL LOW (ref 36.0–46.0)
Hemoglobin: 7.2 g/dL — ABNORMAL LOW (ref 12.0–15.0)
MCH: 25.4 pg — ABNORMAL LOW (ref 26.0–34.0)
MCHC: 28.9 g/dL — ABNORMAL LOW (ref 30.0–36.0)
MCV: 87.7 fL (ref 78.0–100.0)
Platelets: 267 10*3/uL (ref 150–400)
RBC: 2.84 MIL/uL — ABNORMAL LOW (ref 3.87–5.11)
RDW: 19.4 % — ABNORMAL HIGH (ref 11.5–15.5)
WBC: 9.5 10*3/uL (ref 4.0–10.5)

## 2011-01-27 LAB — BASIC METABOLIC PANEL
BUN: 19 mg/dL (ref 6–23)
CO2: 25 mEq/L (ref 19–32)
Calcium: 8.1 mg/dL — ABNORMAL LOW (ref 8.4–10.5)
Chloride: 105 mEq/L (ref 96–112)
Creatinine, Ser: 1.03 mg/dL (ref 0.50–1.10)
GFR calc Af Amer: 59 mL/min — ABNORMAL LOW (ref >90)
GFR calc non Af Amer: 51 mL/min — ABNORMAL LOW (ref >90)
Glucose, Bld: 115 mg/dL — ABNORMAL HIGH (ref 70–99)
Potassium: 4.7 mEq/L (ref 3.5–5.1)
Sodium: 138 mEq/L (ref 135–145)

## 2011-01-27 LAB — HEMOGLOBIN AND HEMATOCRIT, BLOOD
HCT: 35.5 % — ABNORMAL LOW (ref 36.0–46.0)
Hemoglobin: 10.7 g/dL — ABNORMAL LOW (ref 12.0–15.0)

## 2011-01-27 MED ORDER — KETOROLAC TROMETHAMINE 15 MG/ML IJ SOLN
15.0000 mg | Freq: Four times a day (QID) | INTRAMUSCULAR | Status: DC
  Administered 2011-01-27 – 2011-01-29 (×6): 15 mg via INTRAVENOUS
  Filled 2011-01-27 (×16): qty 1

## 2011-01-27 NOTE — Progress Notes (Signed)
1 Day Post-Op laparoscopic right hemicolectomy  Subjective: Pt is sore, but otherwise no complaints.  No nausea.  Pain is controlled with pain meds.  Foley removed this morning, but has not voided yet.  Objective: Vital signs in last 24 hours: Temp:  [97.9 F (36.6 C)-98.8 F (37.1 C)] 98.2 F (36.8 C) (12/13 0500) Pulse Rate:  [84-100] 87  (12/13 0500) Resp:  [16-20] 16  (12/13 0500) BP: (111-134)/(44-73) 111/45 mmHg (12/13 0500) SpO2:  [93 %-100 %] 96 % (12/13 0500) Weight:  [181 lb 15.8 oz (82.55 kg)] 181 lb 15.8 oz (82.55 kg) (12/13 0700) Last BM Date: 01/26/11  Intake/Output from previous day: 12/12 0701 - 12/13 0700 In: 2506 [P.O.:240; I.V.:2266] Out: 865 [Urine:850; Blood:15] Intake/Output this shift:    General appearance: alert, cooperative and no distress Resp: clear to auscultation bilaterally GI: soft, incisional tenderness Dressings with minimal drainage.  Will remove tomorrow  Lab Results:   Basename 01/26/11 0824 01/24/11 1300  WBC -- 9.0  HGB 9.1* 8.7*  HCT 31.6* 30.0*  PLT -- 349   BMET  Basename 01/24/11 1300  NA 139  K 4.3  CL 102  CO2 30  GLUCOSE 102*  BUN 19  CREATININE 0.87  CALCIUM 10.2  Today's labs are pending  Anti-infectives: Anti-infectives     Start     Dose/Rate Route Frequency Ordered Stop   01/26/11 1800   ertapenem (INVANZ) 1 g in sodium chloride 0.9 % 50 mL IVPB        1 g 100 mL/hr over 30 Minutes Intravenous Every 24 hours 01/26/11 1658 01/26/11 1846   01/25/11 1445   ertapenem (INVANZ) 1 g in sodium chloride 0.9 % 50 mL IVPB  Status:  Discontinued        1 g 100 mL/hr over 30 Minutes Intravenous 60 min pre-op 01/25/11 1439 01/26/11 1645          Assessment/Plan: s/p Procedure(s): LAPAROSCOPIC PARTIAL COLECTOMY  Ambulate, continue clears, await bowel function Entereg   LOS: 1 day    Sherri Mcarthy K. 01/27/2011

## 2011-01-27 NOTE — Addendum Note (Signed)
Addendum  created 01/27/11 1513 by Einar Crow   Modules edited:Anesthesia LDA, Anesthesia Medication Administration

## 2011-01-27 NOTE — Progress Notes (Signed)
   CARE MANAGEMENT NOTE 01/27/2011  Patient:  Gina [name], Gina [name]   Account Number:  [id number]  Date Initiated:  01/27/2011  Documentation initiated by:  Carlyle Lipa  Subjective/Objective Assessment:   lap assisted hemicolectomy     Action/Plan:   home when stable postop   Anticipated DC Date:  01/31/2011   Anticipated DC Plan:  HOME/SELF CARE      DC Planning Services  CM consult               Status of service:  In process, will continue to follow  Per UR Regulation:  Reviewed for med. necessity/level of care/duration of stay

## 2011-01-28 MED ORDER — OXYCODONE-ACETAMINOPHEN 5-325 MG PO TABS: 1.0000 | ORAL_TABLET | ORAL | Status: DC | PRN

## 2011-01-28 NOTE — Progress Notes (Signed)
2 Days Post-Op  Subjective: Patient reports flatus.  Minimal pain.  Voiding well  Objective: Vital signs in last 24 hours: Temp:  [98.3 F (36.8 C)-99.5 F (37.5 C)] 98.6 F (37 C) (12/14 0557) Pulse Rate:  [82-99] 99  (12/14 0557) Resp:  [18-20] 20  (12/14 0557) BP: (128-168)/(49-74) 144/53 mmHg (12/14 0557) SpO2:  [91 %-95 %] 92 % (12/14 0557) Last BM Date: 01/26/11  Intake/Output from previous day: 12/13 0701 - 12/14 0700 In: 2096.7 [P.O.:240; I.V.:1856.7] Out: 900 [Urine:900] Intake/Output this shift:    General appearance: alert, cooperative and no distress Resp: clear to auscultation bilaterally GI: soft, + bowel sounds, incisional tenderness Incisions c/d/i  Lab Results:   Basename 01/27/11 1710 01/27/11 0700  WBC -- 9.5  HGB 10.7* 7.2*  HCT 35.5* 24.9*  PLT -- 267   BMET  Basename 01/27/11 0700  NA 138  K 4.7  CL 105  CO2 25  GLUCOSE 115*  BUN 19  CREATININE 1.03  CALCIUM 8.1*   PT/INR No results found for this basename: LABPROT:2,INR:2 in the last 72 hours ABG No results found for this basename: PHART:2,PCO2:2,PO2:2,HCO3:2 in the last 72 hours  Studies/Results: No results found.  Anti-infectives: Anti-infectives     Start     Dose/Rate Route Frequency Ordered Stop   01/26/11 1800   ertapenem (INVANZ) 1 g in sodium chloride 0.9 % 50 mL IVPB        1 g 100 mL/hr over 30 Minutes Intravenous Every 24 hours 01/26/11 1658 01/26/11 1846   01/25/11 1445   ertapenem (INVANZ) 1 g in sodium chloride 0.9 % 50 mL IVPB  Status:  Discontinued        1 g 100 mL/hr over 30 Minutes Intravenous 60 min pre-op 01/25/11 1439 01/26/11 1645          Assessment/Plan: s/p Procedure(s): LAPAROSCOPIC PARTIAL COLECTOMY Advance diet PO pain meds Ambulate Acute blood loss anemia - appropriate response after blood transfusion.  LOS: 2 days    Zyon Grout K. 01/28/2011

## 2011-01-29 NOTE — Progress Notes (Signed)
I have seen and examined the patient and agree with the assessment and plans.  Caidance Sybert A. Treena Cosman  MD, FACS  

## 2011-01-29 NOTE — Progress Notes (Signed)
3 Days Post-Op Lap R colon  Subjective: Tolerating full liquids well. Had a BM this am and reports it was dark, but no pain and pt on FE now  Objective: Vital signs in last 24 hours: Temp:  [98.4 F (36.9 C)-98.5 F (36.9 C)] 98.4 F (36.9 C) (12/15 0633) Pulse Rate:  [79-88] 88  (12/15 0633) Resp:  [16-20] 18  (12/15 0633) BP: (136-148)/(55-62) 148/56 mmHg (12/15 0633) SpO2:  [94 %-96 %] 94 % (12/15 0633) Last BM Date: 01/26/11  Intake/Output from previous day: 12/14 0701 - 12/15 0700 In: 2005.1 [P.O.:240; I.V.:1765.1] Out: 1200 [Urine:1200] Intake/Output this shift:    General appearance: alert, cooperative, appears stated age and no distress Resp: clear to auscultation bilaterally Cardio: regular rate and rhythm GI: soft, non-tender; bowel sounds normal; no masses,  no organomegaly Incision/Wound:  Lab Results:   Basename 01/27/11 1710 01/27/11 0700  WBC -- 9.5  HGB 10.7* 7.2*  HCT 35.5* 24.9*  PLT -- 267   BMET  Basename 01/27/11 0700  NA 138  K 4.7  CL 105  CO2 25  GLUCOSE 115*  BUN 19  CREATININE 1.03  CALCIUM 8.1*     Anti-infectives: Anti-infectives     Start     Dose/Rate Route Frequency Ordered Stop   01/26/11 1800   ertapenem (INVANZ) 1 g in sodium chloride 0.9 % 50 mL IVPB        1 g 100 mL/hr over 30 Minutes Intravenous Every 24 hours 01/26/11 1658 01/26/11 1846   01/25/11 1445   ertapenem (INVANZ) 1 g in sodium chloride 0.9 % 50 mL IVPB  Status:  Discontinued        1 g 100 mL/hr over 30 Minutes Intravenous 60 min pre-op 01/25/11 1439 01/26/11 1645          Assessment/Plan: s/p Procedure(s): LAPAROSCOPIC PARTIAL COLECTOMY Advance diet and NSL IV. Continue to mobilize.  LOS: 3 days    RAYBURN,SHAWN,PA-C Pager [phone] General Trauma Pager [phone]

## 2011-01-30 LAB — TYPE AND SCREEN
ABO/RH(D): B POS
Antibody Screen: NEGATIVE
Unit division: 0
Unit division: 0
Unit division: 0
Unit division: 0

## 2011-01-30 LAB — GLUCOSE, CAPILLARY: Glucose-Capillary: 109 mg/dL — ABNORMAL HIGH (ref 70–99)

## 2011-01-30 NOTE — Progress Notes (Signed)
4 Days Post-Op laparoscopic right hemicolectomy Subjective: No complaints, except several loose bowel movements.  The last few have been more solid since starting regular diet.  Has not used pain meds in two days.  Objective: Vital signs in last 24 hours: Temp:  [98.4 F (36.9 C)-99 F (37.2 C)] 98.4 F (36.9 C) (12/16 0500) Pulse Rate:  [82-85] 82  (12/16 0500) Resp:  [18] 18  (12/16 0500) BP: (130-141)/(55-59) 141/55 mmHg (12/16 0500) SpO2:  [93 %-96 %] 93 % (12/16 0500) Last BM Date: 01/29/11  Intake/Output from previous day: 12/15 0701 - 12/16 0700 In: 1030.8 [P.O.:720; I.V.:310.8] Out: -  Intake/Output this shift:    General appearance: alert and no distress GI: soft, minimal tenderness, incisions healing well with no sign of infection.  Lab Results:   Basename 01/27/11 1710  WBC --  HGB 10.7*  HCT 35.5*  PLT --   BMET No results found for this basename: NA:2,K:2,CL:2,CO2:2,GLUCOSE:2,BUN:2,CREATININE:2,CALCIUM:2 in the last 72 hours PT/INR No results found for this basename: LABPROT:2,INR:2 in the last 72 hours ABG No results found for this basename: PHART:2,PCO2:2,PO2:2,HCO3:2 in the last 72 hours  Studies/Results: No results found.  Anti-infectives: Anti-infectives     Start     Dose/Rate Route Frequency Ordered Stop   01/26/11 1800   ertapenem (INVANZ) 1 g in sodium chloride 0.9 % 50 mL IVPB        1 g 100 mL/hr over 30 Minutes Intravenous Every 24 hours 01/26/11 1658 01/26/11 1846   01/25/11 1445   ertapenem (INVANZ) 1 g in sodium chloride 0.9 % 50 mL IVPB  Status:  Discontinued        1 g 100 mL/hr over 30 Minutes Intravenous 60 min pre-op 01/25/11 1439 01/26/11 1645          Assessment/Plan: s/p Procedure(s): LAPAROSCOPIC PARTIAL COLECTOMY Discharge  Will call the patient for follow-up visit and with pathology results.  LOS: 4 days    Gina [name] K. 01/30/2011

## 2011-01-30 NOTE — Progress Notes (Signed)
Pt to be discharged to home accomp by son.  Pt will have asst by her sons after discharged.  No pain meds needed at this time.  Pt understands follow up appt and that Dr. Stann Mainland call with path report when available.  Kirtland Bouchard, RN

## 2011-01-30 NOTE — Discharge Summary (Signed)
Physician Discharge Summary  Patient ID: KYANNA MAHRT MRN: [id number] DOB/AGE: [birth year] 77 y.o.  Admit date: 01/26/2011 Discharge date: 01/30/2011  Admission Diagnoses:Right colon cancer `    Discharge Diagnoses: same  Active Problems:  * No active hospital problems. *    Discharged Condition: good  Hospital Course: Laparoscopic right hemicolectomy with primary anastomosis on 01/26/11.  She regained bowel function on POD #2 and her diet was slowly advanced.  She has had minimal pain related to the surgery.  She did receive a transfusion of 2 units PRBC for anemia, which was present prior to surgery, but was lower post-op.  Consults: none  Significant Diagnostic Studies: Pathology is still pending  Treatments: surgery: Laparoscopic right hemicolectomy  Discharge Exam: Blood pressure 141/55, pulse 82, temperature 98.4 F (36.9 C), temperature source Oral, resp. rate 18, height 5\' 2"  (1.575 m), weight 181 lb 15.8 oz (82.55 kg), SpO2 93.00%. General appearance: alert, cooperative and no distress GI: soft, non-tender; bowel sounds normal; no masses,  no organomegaly Incisions well-healed, with no sign of infection  Disposition: Home or Self Care  Discharge Orders    Future Appointments: Provider: Department: Dept Phone: Center:   02/23/2011 10:00 AM [facility] [phone] None   02/23/2011 10:30 AM Lauretta I. Odogwu, MD [facility] [phone] None     Future Orders Please Complete By Expires   Diet general      Increase activity slowly      May walk up steps      May shower / Bathe      Driving Restrictions      Comments:   Do not drive while taking pain medications   Call MD for:  temperature >100.4      Call MD for:  persistant nausea and vomiting      Call MD for:  severe uncontrolled pain      Call MD for:  redness, tenderness, or signs of infection (pain, swelling, redness, odor or green/yellow discharge around incision  site)        Current Discharge Medication List    CONTINUE these medications which have NOT CHANGED   Details  acetaminophen (TYLENOL) 500 MG tablet Take 500 mg by mouth every 6 (six) hours as needed. For pain    allopurinol (ZYLOPRIM) 100 MG tablet Take 100 mg by mouth daily.      amLODipine (NORVASC) 5 MG tablet Take 5 mg by mouth daily.      Ascorbic Acid (VITAMIN C) 100 MG tablet Take 500 mg by mouth daily.     atorvastatin (LIPITOR) 40 MG tablet Take 40 mg by mouth daily.      Calcium Carbonate (CALCIUM 600 PO) Take by mouth daily.      enalapril (VASOTEC) 10 MG tablet Take 10 mg by mouth daily.      hydrochlorothiazide (MICROZIDE) 12.5 MG capsule Take 12.5 mg by mouth daily.      Iron-DSS-B12-FA-C-E-Cu-Biotin (HEMAX) 150-1 MG TABS Take 1 tablet by mouth daily.     Loratadine 10 MG CAPS Take 10 mg by mouth daily.     MULTIPLE VITAMIN PO Take 1 tablet by mouth daily.     Multiple Vitamins-Minerals (OCUVITE PRESERVISION) TABS Take 1 tablet by mouth 2 (two) times daily.        Follow-up Information    Follow up with Wynona Luna., MD. Make an appointment in 2 weeks.   Contact information:   [employer] [address]  [facility] [place] [address] [phone]          Signed: Wynona Luna. 01/30/2011, 9:56 AM

## 2011-02-01 ENCOUNTER — Telehealth (INDEPENDENT_AMBULATORY_CARE_PROVIDER_SITE_OTHER): Payer: Self-pay | Admitting: Surgery

## 2011-02-01 NOTE — Telephone Encounter (Signed)
I called the patient to discuss her path report.  Poorly differentiated carcinoma - atypical for a colon primary.  I have forwarded this to Dr. Dalene Carrow for further work-up.  The patient seems to be doing quite well.  Gina [name]. Corliss Skains, MD, [facility]  02/01/2011 5:06 PM

## 2011-02-03 ENCOUNTER — Telehealth: Payer: Self-pay | Admitting: Internal Medicine

## 2011-02-03 MED ORDER — HEMAX 150-1 MG PO TABS: 1.0000 | ORAL_TABLET | Freq: Every day | ORAL | Status: DC

## 2011-02-03 NOTE — Telephone Encounter (Signed)
Patient needs an rx sent to pharmacy for her Hemax. She was given samples in office. Rx sent.

## 2011-02-07 ENCOUNTER — Telehealth: Payer: Self-pay

## 2011-02-07 MED ORDER — ENALAPRIL MALEATE 10 MG PO TABS: 10.0000 mg | ORAL_TABLET | Freq: Every day | ORAL | Status: DC

## 2011-02-07 NOTE — Telephone Encounter (Signed)
Mail order refills

## 2011-02-14 ENCOUNTER — Encounter: Payer: Self-pay | Admitting: Nurse Practitioner

## 2011-02-15 LAB — HM DEXA SCAN

## 2011-02-23 ENCOUNTER — Ambulatory Visit (HOSPITAL_BASED_OUTPATIENT_CLINIC_OR_DEPARTMENT_OTHER): Payer: Medicare Other

## 2011-02-23 ENCOUNTER — Ambulatory Visit: Payer: Medicare Other

## 2011-02-23 ENCOUNTER — Telehealth: Payer: Self-pay | Admitting: *Deleted

## 2011-02-23 ENCOUNTER — Other Ambulatory Visit: Payer: Self-pay | Admitting: Hematology and Oncology

## 2011-02-23 ENCOUNTER — Ambulatory Visit (HOSPITAL_BASED_OUTPATIENT_CLINIC_OR_DEPARTMENT_OTHER): Payer: Medicare Other | Admitting: Hematology and Oncology

## 2011-02-23 VITALS — BP 139/59 | HR 79 | Temp 99.0°F | Ht 62.0 in | Wt 178.9 lb

## 2011-02-23 DIAGNOSIS — D539 Nutritional anemia, unspecified: Secondary | ICD-10-CM

## 2011-02-23 DIAGNOSIS — C189 Malignant neoplasm of colon, unspecified: Secondary | ICD-10-CM | POA: Diagnosis not present

## 2011-02-23 DIAGNOSIS — C182 Malignant neoplasm of ascending colon: Secondary | ICD-10-CM

## 2011-02-23 LAB — CBC WITH DIFFERENTIAL/PLATELET
BASO%: 0.6 % (ref 0.0–2.0)
Basophils Absolute: 0 10*3/uL (ref 0.0–0.1)
EOS%: 5.1 % (ref 0.0–7.0)
Eosinophils Absolute: 0.3 10*3/uL (ref 0.0–0.5)
HCT: 35.5 % (ref 34.8–46.6)
HGB: 11.7 g/dL (ref 11.6–15.9)
LYMPH%: 29.4 % (ref 14.0–49.7)
MCH: 28.5 pg (ref 25.1–34.0)
MCHC: 33 g/dL (ref 31.5–36.0)
MCV: 86.1 fL (ref 79.5–101.0)
MONO#: 0.5 10*3/uL (ref 0.1–0.9)
MONO%: 9.6 % (ref 0.0–14.0)
NEUT#: 2.9 10*3/uL (ref 1.5–6.5)
NEUT%: 55.3 % (ref 38.4–76.8)
Platelets: 246 10*3/uL (ref 145–400)
RBC: 4.12 10*6/uL (ref 3.70–5.45)
RDW: 23.9 % — ABNORMAL HIGH (ref 11.2–14.5)
WBC: 5.3 10*3/uL (ref 3.9–10.3)
lymph#: 1.6 10*3/uL (ref 0.9–3.3)

## 2011-02-23 LAB — VITAMIN B12: Vitamin B-12: 419 pg/mL (ref 211–911)

## 2011-02-23 LAB — COMPREHENSIVE METABOLIC PANEL
ALT: 17 U/L (ref 0–35)
AST: 23 U/L (ref 0–37)
Albumin: 4.2 g/dL (ref 3.5–5.2)
Alkaline Phosphatase: 82 U/L (ref 39–117)
BUN: 20 mg/dL (ref 6–23)
CO2: 24 mEq/L (ref 19–32)
Calcium: 10 mg/dL (ref 8.4–10.5)
Chloride: 104 mEq/L (ref 96–112)
Creatinine, Ser: 0.83 mg/dL (ref 0.50–1.10)
Glucose, Bld: 93 mg/dL (ref 70–99)
Potassium: 4.1 mEq/L (ref 3.5–5.3)
Sodium: 139 mEq/L (ref 135–145)
Total Bilirubin: 0.6 mg/dL (ref 0.3–1.2)
Total Protein: 6.4 g/dL (ref 6.0–8.3)

## 2011-02-23 LAB — IRON AND TIBC
%SAT: 45 % (ref 20–55)
Iron: 164 ug/dL — ABNORMAL HIGH (ref 42–145)
TIBC: 368 ug/dL (ref 250–470)
UIBC: 204 ug/dL (ref 125–400)

## 2011-02-23 LAB — CEA: CEA: 1 ng/mL (ref 0.0–5.0)

## 2011-02-23 LAB — FERRITIN: Ferritin: 23 ng/mL (ref 10–291)

## 2011-02-23 LAB — FOLATE: Folate: >20 ng/mL

## 2011-02-23 NOTE — Telephone Encounter (Signed)
Request to pathology to: 1. Add patient to the Jan. 16, 2013  Cancer Conf. Per request of Dr. Dalene Carrow 2. Send tumor blocks out for MSI testing on accession # [id number].  Request to send out today per Dr. Chari Manning with [facility] in Pathology.

## 2011-02-23 NOTE — Progress Notes (Signed)
This office note has been dictated.

## 2011-02-23 NOTE — Progress Notes (Signed)
Dr.     Illene Regulus      -       Primary. Dr.     Gwyndolyn Kaufman        -       Surgeon. Dr.     Diamond Nickel       -       GI. Dr.     Billy Coast        -       GYN. Dr.      Sena Slate       -       Retinal    Specialist. Dr.      Ernesto Rutherford       -        Ophthalmologist.   [facility]      [facility]armacy     On    [address]  Cell      Phone        [phone].  Daughter in-law       Catherine Cubero     Cell     Phone        [phone].

## 2011-02-23 NOTE — Progress Notes (Signed)
CC: Wilmon Arms. Tsuei, M.D. Rosalyn Gess Norins, MD Hedwig Morton. Juanda Chance, MD  IDENTIFYING PATIENT:  The patient is a 78 year old woman seen at request of Dr. Corliss Skains with colon cancer.  HISTORY OF PRESENT ILLNESS:  The patient recalls GI bleeding following back surgery in April 2012.  At that time she was on Plavix and Aleve. She has history of TIA.  It is felt that the bleeding at that time was due to blood thinners.  She continued to note intermittent rectal bleeding.  As a result of this, she was referred to Dr. [name], who on January 11, 2011, and found a mass just above the cecum.  Biopsies were taken and pathology was consistent with a poorly-differentiated carcinoma.  Immunohistochemistry stains were positive for cytokeratin AE1/AE3 and negative for CD10, CD20,  CDC3, CD30, CD43, and CD79A, chromogranin, synaptophysin, CD56, CDX2, cytokeratin 7, 20, estrogen receptor and progesterone receptor.  The stains were supportive of carcinoma.  The patient went on to have a CT scan of the abdomen on 01/12/2011.  Liver showed no focal lesion or biliary dilatation.  There were stable hypodensities in the right kidney, the largest measuring 1.5 cm.  There was no urinary tract obstruction, hydronephrosis or stone.  A 4 cm mass was seen in the right mid colon with few adjacent small, subcentimeter lymph nodes within the mesentery.  On 01/21/2011 Dr. Corliss Skains performed a laparoscopic-assisted right hemicolectomy.  The patient also recalls receiving 2 units of packed red blood cells prior to his surgery.  At the time of surgery Dr. Corliss Skains felt that the lesion was definitely seen to be originating from the colon.  Surgical pathology revealed a 7 cm poorly-differentiated carcinoma.  The tumor was seen to extend from the mucosa through the muscularis propria into the pericolonic connective tissue. Immunohistochemistry stains were performed and Dr. Jimmy Picket felt that the findings raise the possibility  of a noncolonic primary and suggested clinical correlation.  There was no evidence of lymphovascular invasion.  Surgical margins were free of tumor.  None of the 19 lymph nodes sampled had evidence of malignancy.  There was no evidence of microscopic tumor perforation.  The patient has recovered very well from surgery.  She denies pain.  She is moving her bowels without difficulty. She has not noted any rectal bleeding.  PAST MEDICAL HISTORY: 1. Lumbar surgery in April 2012. 2. History of gout. 3. History of TIA. 4. History of hypertension. 5. History of hyperlipidemia. 6. Allergic rhinitis. 7. Status post breast lumpectomy in 1986 for benign lesion. 8. Tonsillectomy. 9. Macular degeneration.  ALLERGIES:  Penicillin and aspirin.  MEDICATIONS:  Enalapril 10 mg daily, Norvasc 5 mg daily, hydrochlorothiazide 12.5 mg daily, Lipitor 20 mg b.i.d., allopurinol 100 mg daily, fexofenadine 60 mg b.i.d., calcium 600 mg b.i.d., vitamin C 500 mg daily, multivitamin 1 tablet daily, PreserVision 1 tablet b.i.d., Hemax 150 mg daily.  SOCIAL HISTORY:  The patient is a widow.  She has 3 children in their 27s.  She is a retired [employer] worker.  Former smoker, smoking 1 to 1/2 pack a day for 36 years.  Gave up in 1989.  Drinks a glass of white wine at night.  FAMILY HISTORY:  Patient's father had colon cancer.  Patient's mother had breast cancer.  HEALTH MAINTENANCE:  A recent colonoscopy as above.  Is due for mammogram.  REVIEW OF SYSTEMS:  Constitutional:  Denies fever, chills, night sweats, anorexia or weight loss.  GI:  Denies nausea, vomiting, abdominal pain, diarrhea,  melena or hematochezia.  GU:  Denies dysuria, hematuria, nocturia or frequency.  Cardiovascular:  Denies chest pain, PND, orthopnea, ankle swelling.  Respiration:  Denies cough, hemoptysis, wheeze, shortness of breath.  Skin:  No bruising or bleeding. Neurologic:  Denies headache, vision change, extremity weakness.   Rest of review of systems negative.  PHYSICAL EXAMINATION:  The patient is a well-appearing, well-nourished woman in no distress.  Vital signs:  Pulse is 79, blood pressure 139/59, temperature 99, respirations 20, weight is 168 pounds.  HEENT:  Head is atraumatic, normocephalic.  Sclerae anicteric.  Pupils equal, round and reactive to light.  Mouth moist without ulcerations, thrush, or lesions. Neck:  Supple.  Chest:  Clear.  CVS:  1st and 2nd heart sounds present. No added sounds or murmurs.  Abdomen:  Soft.  Well-healed surgical incision.  Bowel sounds present.  Extremities:  No calf tenderness. Pulses present and symmetrical.  Lymph nodes:  No adenopathy.  CNS: Nonfocal.  IMPRESSION AND PLAN:  Ms. Gina [name] is a 78 year old woman who is status post laparoscopic-assisted right hemicolectomy on 01/21/2011 for a T3 N0 (0 of 19 lymph nodes) poorly-differentiated adenocarcinoma of the ascending colon.  She is here to discuss her oncology treatment options. We spent time reviewing her recent diagnosis, specifically her pathology report, her CT scans, and we also talked about potential recommendations.  Primarily, the pathology report had noted immunohistochemistry stains raise the possibility of a noncolonic primary.  However, I did point to the patient that at the time of surgery Dr. Corliss Skains felt that the tumor was arising from within the colon. Dr. Luisa Hart did not feel that this was a neuroendocrine tumor.  The patient  was told that her pathology report would be reviewed at GI tumor board  Next week.  However, in the interim I suggested that the patient complete a staging workup with a CT scan of the chest and a whole-body PET scan.  She had initially presented with anemia; thus, in addition to CBC and CMET, we will obtain anemia panel.  In the meantime, she is to continue with oral iron as she is already doing.   In summary, she was told that she had if she did indeed have an early  stage colon cancer, I would recommend adjuvant chemotherapy , likely xeloda for 6 months because the tumor was poorly-differentiated.  We discussed the logistics and side effects of Xeloda, which included but was not limited to nausea, vomiting, pancytopenia, fatigue, alopecia, skin pigmentation, hand-foot syndrome, diarrhea, mucositis.  She was given literature to review.  If she is indeed a candidate for Xeloda,arrangements will be made for the patient to attend chemotherapy class before she begins therapy.  She is a not a candidate for the NSABP P5 prevention study here at the [facility] as she is on Lipitor.  She asked appropriate questions, which were answered.  She follows up to finalize recommendations.  Spent more than half the time coordinating care.  To do: Ask pathology to analyze tumor for MSI  ______________________________ Laurice Record, M.D. LIO/MEDQ  D:  02/23/2011  T:  02/23/2011  Job:  [id number]

## 2011-02-24 ENCOUNTER — Encounter (INDEPENDENT_AMBULATORY_CARE_PROVIDER_SITE_OTHER): Payer: Self-pay | Admitting: Surgery

## 2011-02-24 ENCOUNTER — Ambulatory Visit (INDEPENDENT_AMBULATORY_CARE_PROVIDER_SITE_OTHER): Payer: Medicare Other | Admitting: Surgery

## 2011-02-24 VITALS — BP 126/78 | HR 76 | Temp 97.6°F | Resp 16 | Ht 62.0 in | Wt 178.4 lb

## 2011-02-24 DIAGNOSIS — C182 Malignant neoplasm of ascending colon: Secondary | ICD-10-CM

## 2011-02-24 NOTE — Progress Notes (Signed)
Status post laparoscopic right hemicolectomy on 01/26/11. Her pathology showed poorly differentiated carcinoma T3 N0 of the descending colon. She is doing quite well. Appetite and bowel movements have returned to normal. She had minimal pain after surgery. Her energy level is good overall.  She had a consultation with Dr. Dalene Carrow yesterday. She is undergoing a staging workup with a CT PET. She will probably undergo 6 months of Xeloda.  Filed Vitals:   02/24/11 1115  BP: 126/78  Pulse: 76  Temp: 97.6 F (36.4 C)  Resp: 16    On examination her incisions are well healed with no sign of infection or hernia. Abdomen is soft nontender.  Imp:  T3N0 poorly differentiated adenocarcinoma of the right colon - doing well s/p resection  Further treatment per Dr. Dalene Carrow.  Surgical issues seem to be resolved.  We would be glad to see her back on a PRN basis.    Wilmon Arms. Corliss Skains, MD, [facility]  02/24/2011 1:34 PM

## 2011-02-25 ENCOUNTER — Telehealth: Payer: Self-pay | Admitting: *Deleted

## 2011-02-25 DIAGNOSIS — Z1231 Encounter for screening mammogram for malignant neoplasm of breast: Secondary | ICD-10-CM | POA: Diagnosis not present

## 2011-02-25 NOTE — Telephone Encounter (Signed)
Called pt and informed her of Hgb level 11.7 done 02/23/11 -  Lab improving.   Instructed pt to continue with po iron;  md will decide on continuation of po iron at next visit.    Pt voiced understanding.

## 2011-03-01 ENCOUNTER — Encounter: Payer: Self-pay | Admitting: Internal Medicine

## 2011-03-03 ENCOUNTER — Ambulatory Visit (HOSPITAL_COMMUNITY)
Admission: RE | Admit: 2011-03-03 | Discharge: 2011-03-03 | Disposition: A | Discharge status: Home / Self Care | Payer: Medicare Other | Source: Ambulatory Visit | Attending: Hematology and Oncology | Admitting: Hematology and Oncology

## 2011-03-03 ENCOUNTER — Encounter (HOSPITAL_COMMUNITY): Payer: Self-pay

## 2011-03-03 DIAGNOSIS — D539 Nutritional anemia, unspecified: Secondary | ICD-10-CM | POA: Insufficient documentation

## 2011-03-03 DIAGNOSIS — J984 Other disorders of lung: Secondary | ICD-10-CM | POA: Diagnosis not present

## 2011-03-03 DIAGNOSIS — C189 Malignant neoplasm of colon, unspecified: Secondary | ICD-10-CM | POA: Diagnosis not present

## 2011-03-03 DIAGNOSIS — I2510 Atherosclerotic heart disease of native coronary artery without angina pectoris: Secondary | ICD-10-CM | POA: Diagnosis not present

## 2011-03-03 LAB — GLUCOSE, CAPILLARY: Glucose-Capillary: 103 mg/dL — ABNORMAL HIGH (ref 70–99)

## 2011-03-03 MED ORDER — IOHEXOL 300 MG/ML  SOLN: 80.0000 mL | Freq: Once | INTRAMUSCULAR | Status: AC | PRN

## 2011-03-03 MED ORDER — FLUDEOXYGLUCOSE F - 18 (FDG) INJECTION
19.6000 | Freq: Once | INTRAVENOUS | Status: AC | PRN
  Administered 2011-03-03: 19.6 via INTRAVENOUS

## 2011-03-06 ENCOUNTER — Other Ambulatory Visit: Payer: Self-pay | Admitting: Hematology and Oncology

## 2011-03-06 DIAGNOSIS — C182 Malignant neoplasm of ascending colon: Secondary | ICD-10-CM

## 2011-03-07 ENCOUNTER — Telehealth: Payer: Self-pay | Admitting: Hematology and Oncology

## 2011-03-07 NOTE — Telephone Encounter (Signed)
S/w pt today re appts for lb/ct 4/15 and LO 4/19.

## 2011-03-15 DIAGNOSIS — D235 Other benign neoplasm of skin of trunk: Secondary | ICD-10-CM | POA: Diagnosis not present

## 2011-03-15 DIAGNOSIS — L821 Other seborrheic keratosis: Secondary | ICD-10-CM | POA: Diagnosis not present

## 2011-03-15 DIAGNOSIS — L259 Unspecified contact dermatitis, unspecified cause: Secondary | ICD-10-CM | POA: Diagnosis not present

## 2011-03-18 DIAGNOSIS — H1045 Other chronic allergic conjunctivitis: Secondary | ICD-10-CM | POA: Diagnosis not present

## 2011-03-18 DIAGNOSIS — Z961 Presence of intraocular lens: Secondary | ICD-10-CM | POA: Diagnosis not present

## 2011-03-18 DIAGNOSIS — H3530 Unspecified macular degeneration: Secondary | ICD-10-CM | POA: Diagnosis not present

## 2011-03-29 DIAGNOSIS — M7130 Other bursal cyst, unspecified site: Secondary | ICD-10-CM | POA: Diagnosis not present

## 2011-03-29 DIAGNOSIS — M545 Low back pain, unspecified: Secondary | ICD-10-CM | POA: Diagnosis not present

## 2011-03-29 DIAGNOSIS — M4310 Spondylolisthesis, site unspecified: Secondary | ICD-10-CM | POA: Diagnosis not present

## 2011-03-29 DIAGNOSIS — H353190 Nonexudative age-related macular degeneration, unspecified eye, stage unspecified: Secondary | ICD-10-CM | POA: Diagnosis not present

## 2011-03-29 DIAGNOSIS — H35369 Drusen (degenerative) of macula, unspecified eye: Secondary | ICD-10-CM | POA: Diagnosis not present

## 2011-03-31 ENCOUNTER — Ambulatory Visit (INDEPENDENT_AMBULATORY_CARE_PROVIDER_SITE_OTHER): Payer: Medicare Other | Admitting: Internal Medicine

## 2011-03-31 ENCOUNTER — Encounter: Payer: Self-pay | Admitting: Internal Medicine

## 2011-03-31 VITALS — BP 130/78 | HR 74 | Temp 97.5°F | Ht 62.0 in | Wt 178.1 lb

## 2011-03-31 DIAGNOSIS — H6690 Otitis media, unspecified, unspecified ear: Secondary | ICD-10-CM | POA: Diagnosis not present

## 2011-03-31 DIAGNOSIS — H6692 Otitis media, unspecified, left ear: Secondary | ICD-10-CM | POA: Insufficient documentation

## 2011-03-31 DIAGNOSIS — R42 Dizziness and giddiness: Secondary | ICD-10-CM | POA: Insufficient documentation

## 2011-03-31 DIAGNOSIS — I10 Essential (primary) hypertension: Secondary | ICD-10-CM

## 2011-03-31 MED ORDER — AZITHROMYCIN 250 MG PO TABS: ORAL_TABLET | ORAL | Status: AC

## 2011-03-31 MED ORDER — MECLIZINE HCL 12.5 MG PO TABS: 12.5000 mg | ORAL_TABLET | Freq: Three times a day (TID) | ORAL | Status: AC | PRN

## 2011-03-31 NOTE — Patient Instructions (Signed)
Take all new medications as prescribed Continue all other medications as before  

## 2011-04-02 ENCOUNTER — Encounter: Payer: Self-pay | Admitting: Internal Medicine

## 2011-04-02 NOTE — Assessment & Plan Note (Signed)
Mild to mod, for antibx course,  to f/u any worsening symptoms or concerns 

## 2011-04-02 NOTE — Assessment & Plan Note (Signed)
stable overall by hx and exam, most recent data reviewed with pt, and pt to continue medical treatment as before  BP Readings from Last 3 Encounters:  03/31/11 130/78  02/24/11 126/78  02/23/11 139/59

## 2011-04-02 NOTE — Assessment & Plan Note (Signed)
Mild to mod, for meclizine prn course,  to f/u any worsening symptoms or concerns 

## 2011-04-02 NOTE — Progress Notes (Signed)
Subjective:    Patient ID: Gina [name], female    DOB: [birth year], 78 y.o.   MRN: [id number]  HPI   Here with 3 days acute onset fever, left ear pain, pressure, general weakness and malaise, with slight ST, but little to no cough and Pt denies chest pain, increased sob or doe, wheezing, orthopnea, PND, increased LE swelling, palpitations, dizziness or syncope, except for vertigo positional onset since last night.  Pt denies new neurological symptoms such as new headache, or facial or extremity weakness or numbness   Pt denies polydipsia, polyuria.   Pt denies fever, wt loss, night sweats, loss of appetite, or other constitutional symptoms, except for the above.  Past Medical History  Diagnosis Date  . Unspecified transient cerebral ischemia   . Hyperplastic colon polyp   . Diverticulosis of colon (without mention of hemorrhage)   . Personal history of other diseases of digestive system   . Hypertension   . Hyperlipidemia   . Gout   . Allergic rhinitis   . C. difficile colitis   . GI bleed   . Mass of colon 01-11-2011     Colonoscopy   . Arthritis   . Cancer   . Stroke     TIA   Past Surgical History  Procedure Date  . Breast lumpectomy     right breast '86  . Tonsillectomy   . Back surgery     reports that she has quit smoking. Her smoking use included Cigarettes. She quit smokeless tobacco use about 24 years ago. She reports that she drinks about 8.4 ounces of alcohol per week. She reports that she does not use illicit drugs. family history includes Breast cancer in her mother; Colon cancer in her father; Coronary artery disease in her father and mother; Heart failure in her mother; Hyperlipidemia in her father and mother; Hypertension in her father and mother; and Ulcers in her father. Allergies  Allergen Reactions  . Aspirin     REACTION: Hives  . Penicillins     REACTION: Hives   Current Outpatient Prescriptions on File Prior to Visit  Medication Sig Dispense Refill   . acetaminophen (TYLENOL) 500 MG tablet Take 500 mg by mouth every 6 (six) hours as needed. For pain      . allopurinol (ZYLOPRIM) 100 MG tablet Take 100 mg by mouth daily.        [name] amLODipine (NORVASC) 5 MG tablet Take 5 mg by mouth daily.        . Ascorbic Acid (VITAMIN C) 100 MG tablet Take 500 mg by mouth daily.       [name] atorvastatin (LIPITOR) 40 MG tablet Take 40 mg by mouth daily.        . Calcium Carbonate (CALCIUM 600 PO) Take 600 mg by mouth 2 (two) times daily.       . enalapril (VASOTEC) 10 MG tablet Take 1 tablet (10 mg total) by mouth daily.  90 tablet  3  . hydrochlorothiazide (MICROZIDE) 12.5 MG capsule Take 12.5 mg by mouth daily.        . Iron-DSS-B12-FA-C-E-Cu-Biotin (HEMAX) 150-1 MG TABS Take 1 tablet by mouth daily.  30 tablet  2  . Loratadine 10 MG CAPS Take 10 mg by mouth daily.       . MULTIPLE VITAMIN PO Take 1 tablet by mouth daily.       . Multiple Vitamins-Minerals (OCUVITE PRESERVISION) TABS Take 1 tablet by mouth 2 (two) times daily.       [name]  fexofenadine (ALLEGRA) 60 MG tablet Take 60 mg by mouth 2 (two) times daily.       Review of Systems Review of Systems  Constitutional: Negative for diaphoresis and unexpected weight change.  HENT: Negative for drooling and tinnitus.   Eyes: Negative for photophobia and visual disturbance.  Respiratory: Negative for choking and stridor.   Gastrointestinal: Negative for vomiting and blood in stool.  Genitourinary: Negative for hematuria and decreased urine volume.     Objective:   Physical Exam BP 130/78  Pulse 74  Temp(Src) 97.5 F (36.4 C) (Oral)  Ht 5\' 2"  (1.575 m)  Wt 178 lb 2 oz (80.797 kg)  BMI 32.58 kg/m2  SpO2 95% Physical Exam  VS noted, mild ill Constitutional: Pt appears well-developed and well-nourished.  HENT: Head: Normocephalic.  Right Ear: External ear normal.  Left Ear: External ear normal. Left TM severe red, bulging mild Eyes: Conjunctivae and EOM are normal. Pupils are equal, round, and  reactive to light.  Neck: Normal range of motion. Neck supple.  Cardiovascular: Normal rate and regular rhythm.   Pulmonary/Chest: Effort normal and breath sounds normal.  Neurological: Pt is alert. No cranial nerve deficit. motor/gait intact Skin: Skin is warm. No erythema.  Psychiatric: Pt behavior is normal. Thought content normal.     Assessment & Plan:

## 2011-04-12 ENCOUNTER — Telehealth: Payer: Self-pay | Admitting: *Deleted

## 2011-04-12 NOTE — Telephone Encounter (Signed)
Patient just wanted to inform you that she has started back on Plavix & she has enough medication for right now; she will be out of town in [facility] for the month of March '13

## 2011-04-12 NOTE — Telephone Encounter (Signed)
Noted  

## 2011-05-18 ENCOUNTER — Other Ambulatory Visit: Payer: Self-pay | Admitting: *Deleted

## 2011-05-30 ENCOUNTER — Ambulatory Visit (HOSPITAL_COMMUNITY)
Admission: RE | Admit: 2011-05-30 | Discharge: 2011-05-30 | Disposition: A | Discharge status: Home / Self Care | Payer: Medicare Other | Source: Ambulatory Visit | Attending: Hematology and Oncology | Admitting: Hematology and Oncology

## 2011-05-30 ENCOUNTER — Other Ambulatory Visit (HOSPITAL_BASED_OUTPATIENT_CLINIC_OR_DEPARTMENT_OTHER): Payer: Medicare Other | Admitting: Lab

## 2011-05-30 DIAGNOSIS — C182 Malignant neoplasm of ascending colon: Secondary | ICD-10-CM

## 2011-05-30 DIAGNOSIS — C189 Malignant neoplasm of colon, unspecified: Secondary | ICD-10-CM | POA: Diagnosis not present

## 2011-05-30 DIAGNOSIS — C3490 Malignant neoplasm of unspecified part of unspecified bronchus or lung: Secondary | ICD-10-CM | POA: Insufficient documentation

## 2011-05-30 DIAGNOSIS — K5730 Diverticulosis of large intestine without perforation or abscess without bleeding: Secondary | ICD-10-CM | POA: Diagnosis not present

## 2011-05-30 DIAGNOSIS — R160 Hepatomegaly, not elsewhere classified: Secondary | ICD-10-CM | POA: Diagnosis not present

## 2011-05-30 DIAGNOSIS — C180 Malignant neoplasm of cecum: Secondary | ICD-10-CM

## 2011-05-30 DIAGNOSIS — Z9049 Acquired absence of other specified parts of digestive tract: Secondary | ICD-10-CM | POA: Diagnosis not present

## 2011-05-30 DIAGNOSIS — R911 Solitary pulmonary nodule: Secondary | ICD-10-CM | POA: Diagnosis not present

## 2011-05-30 DIAGNOSIS — N289 Disorder of kidney and ureter, unspecified: Secondary | ICD-10-CM | POA: Insufficient documentation

## 2011-05-30 LAB — CMP (CANCER CENTER ONLY)
ALT(SGPT): 26 U/L (ref 10–47)
AST: 28 U/L (ref 11–38)
Albumin: 3.9 g/dL (ref 3.3–5.5)
Alkaline Phosphatase: 78 U/L (ref 26–84)
BUN, Bld: 18 mg/dL (ref 7–22)
CO2: 30 mEq/L (ref 18–33)
Calcium: 9.6 mg/dL (ref 8.0–10.3)
Chloride: 97 mEq/L — ABNORMAL LOW (ref 98–108)
Creat: 0.8 mg/dl (ref 0.6–1.2)
Glucose, Bld: 112 mg/dL (ref 73–118)
Potassium: 3.8 mEq/L (ref 3.3–4.7)
Sodium: 143 mEq/L (ref 128–145)
Total Bilirubin: 0.9 mg/dl (ref 0.20–1.60)
Total Protein: 7.6 g/dL (ref 6.4–8.1)

## 2011-05-30 MED ORDER — IOHEXOL 300 MG/ML  SOLN
100.0000 mL | Freq: Once | INTRAMUSCULAR | Status: AC | PRN
  Administered 2011-05-30: 100 mL via INTRAVENOUS

## 2011-06-03 ENCOUNTER — Ambulatory Visit (HOSPITAL_BASED_OUTPATIENT_CLINIC_OR_DEPARTMENT_OTHER): Payer: Medicare Other | Admitting: Hematology and Oncology

## 2011-06-03 ENCOUNTER — Encounter: Payer: Self-pay | Admitting: Hematology and Oncology

## 2011-06-03 ENCOUNTER — Telehealth: Payer: Self-pay | Admitting: Hematology and Oncology

## 2011-06-03 VITALS — BP 147/74 | HR 75 | Temp 98.3°F | Ht 62.0 in | Wt 180.2 lb

## 2011-06-03 DIAGNOSIS — C182 Malignant neoplasm of ascending colon: Secondary | ICD-10-CM | POA: Diagnosis not present

## 2011-06-03 DIAGNOSIS — C189 Malignant neoplasm of colon, unspecified: Secondary | ICD-10-CM

## 2011-06-03 NOTE — Patient Instructions (Signed)
Patient to follow up as instructed.   Current Outpatient Prescriptions  Medication Sig Dispense Refill  . acetaminophen (TYLENOL) 500 MG tablet Take 500 mg by mouth every 6 (six) hours as needed. For pain      . allopurinol (ZYLOPRIM) 100 MG tablet Take 100 mg by mouth daily.        [name] amLODipine (NORVASC) 5 MG tablet Take 5 mg by mouth daily.        . Ascorbic Acid (VITAMIN C) 100 MG tablet Take 500 mg by mouth daily.       [name] atorvastatin (LIPITOR) 40 MG tablet Take 40 mg by mouth daily.        . Calcium Carbonate (CALCIUM 600 PO) Take 600 mg by mouth 2 (two) times daily.       . clopidogrel (PLAVIX) 75 MG tablet Take 75 mg by mouth daily.      . enalapril (VASOTEC) 10 MG tablet Take 1 tablet (10 mg total) by mouth daily.  90 tablet  3  . hydrochlorothiazide (MICROZIDE) 12.5 MG capsule Take 12.5 mg by mouth daily.        . Loratadine 10 MG CAPS Take 10 mg by mouth daily.       . MULTIPLE VITAMIN PO Take 1 tablet by mouth daily.       . Multiple Vitamins-Minerals (OCUVITE PRESERVISION) TABS Take 1 tablet by mouth 2 (two) times daily.       . fexofenadine (ALLEGRA) 60 MG tablet Take 60 mg by mouth 2 (two) times daily.      . Iron-DSS-B12-FA-C-E-Cu-Biotin (HEMAX) 150-1 MG TABS Take 1 tablet by mouth daily.  30 tablet  17 May 2011  Sunday Monday Tuesday Wednesday Thursday Friday Saturday      1   2   3   4   5   6    7   8   9   10   11   12   13    14   15    LAB MO    12:30 PM  (15 min.)  Krista Blue  [facility]   CT BODY W/   1:30 PM  (30 min.)  Wl-Ct 2  [facility] 16   17   18   19    EST PT 30   9:00 AM  (30 min.)  Laurice Record, MD  [facility]COL[facility] 20    21   22   23   24   25   26   27    28   29   14 Jul 2011  Sunday Monday Tuesday Wednesday Thursday Friday Saturday             1   2   3   4    5   6   7   8   9   10   11    12  Happy Birthday!   13   14   15   16   17   18    19   20   21   22   23   24   25    26   27   28   29    30  31            

## 2011-06-03 NOTE — Progress Notes (Signed)
CC:   Gina Gess. Norins, MD Wilmon Arms. Tsuei, M.D. Hedwig Morton. Juanda Chance, MD  IDENTIFYING STATEMENT:  The patient is a 78 year old woman with colon cancer who presents for followup.  INTERVAL HISTORY:  Gina [name] was last seen 4 months ago.  Has had no issues or concerns since her last followup visit.  Feels well.  She is without nausea, vomiting, or abdominal pain.  Her weight is stable.  Her bowels have not changed.  She has no rectal bleeding.  MEDICATIONS:  Medications reviewed and updated.  ALLERGIES:  Aspirin, penicillin.  PAST MEDICAL HISTORY/FAMILY HISTORY/SOCIAL HISTORY:  Unchanged.  PHYSICAL EXAMINATION:  General:  The patient is a well-appearing, well- nourished woman in no distress.  Vitals:  Pulse 75, blood pressure 147/74, temperature 98.3, respirations 18, weight 180.2 pounds.  HEENT: Head is atraumatic, normocephalic.  Sclerae anicteric.  Mouth moist. Neck:  Supple.  Chest:  Clear.  CVS:  Unremarkable.  Abdomen:  Soft, nontender.  Bowel sounds present.  Extremities:  No edema.  LABORATORY DATA:  CBC pending.  CMET:  Sodium 143, potassium 3.8, chloride 97, CO2 is 30, BUN 18, creatinine 0.8.  T-bili 0.9, alkaline phosphatase 78, AST 28, ALT 26, calcium 9.6.  Results of CT scan on 05/30/2011 showed no evidence of recurrent or metastatic disease.  Bone windows revealed no worrisome lytic or sclerotic lesions.  The 6-mm pulmonary nodules seen in left lung base remain unchanged.  No focal lesions within the liver.  There was no abdominal or pelvic adenopathy.  IMPRESSION AND PLAN:  Gina [name] is a 78 year old woman who is status post laparoscopic-assisted right hemicolectomy on 01/21/2011 for a T3 N0 (0 of 19 lymph nodes) poorly differentiated adenocarcinoma of the ascending colon.  She is undergoing surveillance.  Her current staging workup and exam indicates no evidence of recurrence.  So with this said, she will continue surveillance and has a followup visit in 4  months' time with blood work only.  She is due for colonoscopy in November 2013. She had a number of questions which were answered.    ______________________________ Laurice Record, M.D. LIO/MEDQ  D:  06/03/2011  T:  06/03/2011  Job:  [id number]

## 2011-06-03 NOTE — Telephone Encounter (Signed)
appts made and printed for pt aom °

## 2011-06-03 NOTE — Progress Notes (Signed)
This office note has been dictated.

## 2011-09-01 ENCOUNTER — Ambulatory Visit (INDEPENDENT_AMBULATORY_CARE_PROVIDER_SITE_OTHER): Payer: Medicare Other | Admitting: Internal Medicine

## 2011-09-01 ENCOUNTER — Other Ambulatory Visit: Payer: Self-pay | Admitting: *Deleted

## 2011-09-01 ENCOUNTER — Other Ambulatory Visit (INDEPENDENT_AMBULATORY_CARE_PROVIDER_SITE_OTHER): Payer: Medicare Other

## 2011-09-01 ENCOUNTER — Encounter: Payer: Self-pay | Admitting: Internal Medicine

## 2011-09-01 VITALS — BP 112/62 | HR 66 | Temp 98.0°F | Resp 16 | Wt 180.0 lb

## 2011-09-01 DIAGNOSIS — E785 Hyperlipidemia, unspecified: Secondary | ICD-10-CM

## 2011-09-01 DIAGNOSIS — C189 Malignant neoplasm of colon, unspecified: Secondary | ICD-10-CM

## 2011-09-01 DIAGNOSIS — M109 Gout, unspecified: Secondary | ICD-10-CM

## 2011-09-01 DIAGNOSIS — I10 Essential (primary) hypertension: Secondary | ICD-10-CM | POA: Diagnosis not present

## 2011-09-01 DIAGNOSIS — C182 Malignant neoplasm of ascending colon: Secondary | ICD-10-CM | POA: Diagnosis not present

## 2011-09-01 DIAGNOSIS — Z0000 Encounter for general adult medical examination without abnormal findings: Secondary | ICD-10-CM | POA: Diagnosis not present

## 2011-09-01 LAB — LIPID PANEL
Cholesterol: 175 mg/dL (ref 0–200)
HDL: 67.8 mg/dL (ref >39.00)
Total CHOL/HDL Ratio: 3
Triglycerides: 202 mg/dL — ABNORMAL HIGH (ref 0.0–149.0)
VLDL: 40.4 mg/dL — ABNORMAL HIGH (ref 0.0–40.0)

## 2011-09-01 LAB — LDL CHOLESTEROL, DIRECT: Direct LDL: 75.4 mg/dL

## 2011-09-01 LAB — URIC ACID: Uric Acid, Serum: 6.6 mg/dL (ref 2.4–7.0)

## 2011-09-01 MED ORDER — ENALAPRIL MALEATE 10 MG PO TABS: 10.0000 mg | ORAL_TABLET | Freq: Every day | ORAL | Status: DC

## 2011-09-01 MED ORDER — AMLODIPINE BESYLATE 5 MG PO TABS: 5.0000 mg | ORAL_TABLET | Freq: Every day | ORAL | Status: DC

## 2011-09-01 MED ORDER — ATORVASTATIN CALCIUM 40 MG PO TABS: 40.0000 mg | ORAL_TABLET | Freq: Every day | ORAL | Status: DC

## 2011-09-01 MED ORDER — ALLOPURINOL 100 MG PO TABS: 100.0000 mg | ORAL_TABLET | Freq: Every day | ORAL | Status: DC

## 2011-09-01 MED ORDER — HYDROCHLOROTHIAZIDE 12.5 MG PO CAPS: 12.5000 mg | ORAL_CAPSULE | Freq: Every day | ORAL | Status: DC

## 2011-09-01 MED ORDER — CLOPIDOGREL BISULFATE 75 MG PO TABS: 75.0000 mg | ORAL_TABLET | Freq: Every day | ORAL | Status: DC

## 2011-09-01 NOTE — Telephone Encounter (Signed)
MEDICATON REFILLS SENT TO [facility]

## 2011-09-01 NOTE — Progress Notes (Signed)
Subjective:    Patient ID: Gina [name], female    DOB: 1931/06/12, 78 y.o.   MRN: [id number]  HPI Gina [name] is here for annual Medicare wellness examination and management of other chronic and acute problems. Do well with no new medical problems or active complaints. She follows routinely with Dr. Dalene Carrow: last exam, labs and imaging in  April '13 - all was good.    The risk factors are reflected in the social history.  The roster of all physicians providing medical care to patient - is listed in the Snapshot section of the chart.  Activities of daily living:  The patient is 100% inedpendent in all ADLs: dressing, toileting, feeding as well as independent mobility  Home safety : The patient has smoke detectors in the home. Fall - home is fall safe. They wear seatbelts. No firearms at home. There is no violence in the home.   There is no risks for hepatitis, STDs or HIV. There is no   history of blood transfusion. They have no travel history to infectious disease endemic areas of the world.  The patient has seen their dentist in the last six month. They have seen their eye doctor in the last year. They deny any hearing difficulty and have not had audiologic testing in the last year.  They do not  have excessive sun exposure. Discussed the need for sun protection: hats, long sleeves and use of sunscreen if there is significant sun exposure.   Diet: the importance of a healthy diet is discussed. They do have a healthy diet.  The patient has no regular exercise program.  The benefits of regular aerobic exercise were discussed.  Depression screen: there are no signs or vegative symptoms of depression- irritability, change in appetite, anhedonia, sadness/tearfullness.  Cognitive assessment: the patient manages all their financial and personal affairs and is actively engaged.   The following portions of the patient's history were reviewed and updated as appropriate: allergies, current  medications, past family history, past medical history,  past surgical history, past social history  and problem list.  Vision, hearing, body mass index were assessed and reviewed.   During the course of the visit the patient was educated and counseled about appropriate screening and preventive services including : fall prevention , diabetes screening, nutrition counseling, colorectal cancer screening, and recommended immunizations.  Past Medical History  Diagnosis Date  . Unspecified transient cerebral ischemia sept '11  . Hyperplastic colon polyp   . Diverticulosis of colon (without mention of hemorrhage)   . Hypertension   . Hyperlipidemia   . Gout   . Allergic rhinitis   . GI bleed   . Arthritis   . Stroke     TIA  . Cancer Dec 12    colon   Past Surgical History  Procedure Date  . Breast lumpectomy     right breast '86  . Tonsillectomy   . Back surgery April '12    benign cyst spine ([name])  . Colon surgery Dec '12    colectomy for colon cancer ([name])   Family History  Problem Relation Age of Onset  . Colon cancer Father   . Hypertension Father   . Hyperlipidemia Father   . Coronary artery disease Father   . Ulcers Father   . Heart failure Mother   . Coronary artery disease Mother   . Breast cancer Mother   . Hypertension Mother   . Hyperlipidemia Mother   . Asthma Mother   .  Diabetes Neg Hx    History   Social History  . Marital Status: Widowed    Spouse Name: N/A    Number of Children: 3  . Years of Education: 14   Occupational History  . librarian     retired   Social History Main Topics  . Smoking status: Former Smoker -- 1.5 packs/day for 30 years    Types: Cigarettes    Quit date: 07/21/1987  . Smokeless tobacco: Never Used  . Alcohol Use: 8.4 oz/week    14 Glasses of wine per week     Wine daily  . Drug Use: No  . Sexually Active: Not Currently   Other Topics Concern  . Not on file   Social History Narrative   HSG, Married 1955  widowed 2010. 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 Passed away 06-24-2008 Her son  moved to [name] (July '10). End of life care:  no extra ordinary measures: wants cardiac resusitation and mechanical ventilation short-term if needed. Does not want to be kept in a persistant vegative state or long term artifical life support.     Current Outpatient Prescriptions on File Prior to Visit  Medication Sig Dispense Refill  . acetaminophen (TYLENOL) 500 MG tablet Take 500 mg by mouth every 6 (six) hours as needed. For pain      . Ascorbic Acid (VITAMIN C) 100 MG tablet Take 500 mg by mouth daily.       . Calcium Carbonate (CALCIUM 600 PO) Take 600 mg by mouth 2 (two) times daily.       . Loratadine 10 MG CAPS Take 10 mg by mouth daily.       . MULTIPLE VITAMIN PO Take 1 tablet by mouth daily.       . Multiple Vitamins-Minerals (OCUVITE PRESERVISION) TABS Take 1 tablet by mouth 2 (two) times daily.       [name] DISCONTD: allopurinol (ZYLOPRIM) 100 MG tablet Take 100 mg by mouth daily.        [name] DISCONTD: amLODipine (NORVASC) 5 MG tablet Take 5 mg by mouth daily.        [name] DISCONTD: atorvastatin (LIPITOR) 40 MG tablet Take 40 mg by mouth daily.        [name] DISCONTD: enalapril (VASOTEC) 10 MG tablet Take 1 tablet (10 mg total) by mouth daily.  90 tablet  3  . DISCONTD: hydrochlorothiazide (MICROZIDE) 12.5 MG capsule Take 12.5 mg by mouth daily.        . fexofenadine (ALLEGRA) 60 MG tablet Take 60 mg by mouth 2 (two) times daily.      . Iron-DSS-B12-FA-C-E-Cu-Biotin (HEMAX) 150-1 MG TABS Take 1 tablet by mouth daily.  30 tablet  2     Review of Systems Constitutional:  Negative for fever, chills, activity change and unexpected weight change.  HEENT:  Negative for hearing loss, ear pain, congestion, neck stiffness and postnasal drip. Negative  for sore throat or swallowing problems. Negative for dental complaints.   Eyes: Negative for vision loss or change in visual acuity.  Respiratory: Negative for chest tightness and wheezing. Negative for DOE.   Cardiovascular: Negative for chest pain or palpitations. No decreased exercise tolerance Gastrointestinal: No change in bowel habit. No bloating or gas. No reflux or indigestion Genitourinary: Negative for urgency, frequency, flank pain and difficulty urinating.  Musculoskeletal: Negative for myalgias, back pain, arthralgias and gait problem.  Neurological: Negative for dizziness, tremors, weakness and headaches.  Hematological: Negative for adenopathy.  Psychiatric/Behavioral: Negative for behavioral problems and dysphoric mood.       Objective:   Physical Exam        Assessment & Plan:   Filed Vitals:   09/01/11 0903  BP: 112/62  Pulse: 66  Temp: 98 F (36.7 C)  Resp: 16   Wt Readings from Last 3 Encounters:  09/01/11 180 lb (81.647 kg)  06/03/11 180 lb 3.2 oz (81.738 kg)  03/31/11 178 lb 2 oz (80.797 kg)    Gen'l: well nourished, well developed, overweight white woman in no distress HEENT - [place]/AT, EACs/TMs normal, oropharynx with native dentition in good condition, no buccal or palatal lesions, posterior pharynx clear, mucous membranes moist. C&S clear, PERRLA, fundi - normal Neck - supple, no thyromegaly Nodes- negative submental, cervical, supraclavicular regions Chest - no deformity, no CVAT Lungs - cleat without rales, wheezes. No increased work of breathing Breast - deferred Cardiovascular - regular rate and rhythm, quiet precordium, no murmurs, rubs or gallops, 2+ radial, DP and PT pulses Abdomen - BS+ x 4, no HSM, no guarding or rebound or tenderness Pelvic - deferred based on age and lack of symptoms Rectal - deferred  Extremities - no clubbing, cyanosis, edema or deformity.  Neuro - A&O x 3, CN II-XII normal, motor strength normal and equal, DTRs 2+  and symmetrical biceps, radial, and patellar tendons. Cerebellar - no tremor, no rigidity, fluid movement and normal gait. Derm - Head, neck, back, abdomen and extremities without suspicious lesions  Lab Results  Component Value Date   WBC 5.3 02/23/2011   HGB 11.7 02/23/2011   HCT 35.5 02/23/2011   PLT 246 02/23/2011   GLUCOSE 112 05/30/2011   CHOL 175 09/01/2011   TRIG 202.0* 09/01/2011   HDL 67.80 09/01/2011   LDLDIRECT 75.4 09/01/2011   LDLCALC 67 12/03/2009        ALT 17 02/23/2011   AST 28 05/30/2011   NA 143 05/30/2011   K 3.8 05/30/2011   CL 97* 05/30/2011   CREATININE 0.8 05/30/2011   BUN 18 05/30/2011   CO2 30 05/30/2011   TSH 1.62 01/23/2009   INR 0.94 06/07/2010   HGBA1C 5.7                                               11/12/2009

## 2011-09-04 DIAGNOSIS — Z0000 Encounter for general adult medical examination without abnormal findings: Secondary | ICD-10-CM | POA: Insufficient documentation

## 2011-09-04 NOTE — Assessment & Plan Note (Signed)
H/OBJECTIVE: adenocarcinoma colon with successful resection, clean margins and no nodal involvement per pathology. She is doing well.   Plan Follow-up with Dr. Dalene Carrow as instructed  Follow-up colonoscopy in Nov '13.

## 2011-09-04 NOTE — Assessment & Plan Note (Signed)
No recent flares. Serum Uric Acid level 6.6 = normal.

## 2011-09-04 NOTE — Assessment & Plan Note (Signed)
Interval hx since last annual exam remarkable for colon cancer with successful surgery. No further back problems and no recurrent TIAs. Physical exam, sans breast and pelvic, is normal. Labs reviewed and brought up to date - normal values with good cholesterol control. She is current in regard to immunizations.  In summary - a delightful woman who is doing very well at this time. She will work on her weight and will continue to increase her exercise. She will return as needed or in 1 year.

## 2011-09-04 NOTE — Assessment & Plan Note (Signed)
BP Readings from Last 3 Encounters:  09/01/11 112/62  06/03/11 147/74  03/31/11 130/78   Very good control on present medications - no changes.

## 2011-09-04 NOTE — Assessment & Plan Note (Signed)
LDL @ 75.4 is better than goal of 80 or less.  Plan  Continue present regimen

## 2011-09-21 DIAGNOSIS — C44319 Basal cell carcinoma of skin of other parts of face: Secondary | ICD-10-CM | POA: Diagnosis not present

## 2011-09-21 DIAGNOSIS — L570 Actinic keratosis: Secondary | ICD-10-CM | POA: Diagnosis not present

## 2011-09-21 DIAGNOSIS — D235 Other benign neoplasm of skin of trunk: Secondary | ICD-10-CM | POA: Diagnosis not present

## 2011-09-26 ENCOUNTER — Other Ambulatory Visit (HOSPITAL_BASED_OUTPATIENT_CLINIC_OR_DEPARTMENT_OTHER): Payer: Medicare Other | Admitting: Lab

## 2011-09-26 DIAGNOSIS — C189 Malignant neoplasm of colon, unspecified: Secondary | ICD-10-CM

## 2011-09-26 LAB — COMPREHENSIVE METABOLIC PANEL
ALT: 15 U/L (ref 0–35)
AST: 21 U/L (ref 0–37)
Albumin: 4 g/dL (ref 3.5–5.2)
Alkaline Phosphatase: 76 U/L (ref 39–117)
BUN: 21 mg/dL (ref 6–23)
CO2: 32 mEq/L (ref 19–32)
Calcium: 9.8 mg/dL (ref 8.4–10.5)
Chloride: 102 mEq/L (ref 96–112)
Creatinine, Ser: 0.84 mg/dL (ref 0.50–1.10)
Glucose, Bld: 85 mg/dL (ref 70–99)
Potassium: 4.4 mEq/L (ref 3.5–5.3)
Sodium: 142 mEq/L (ref 135–145)
Total Bilirubin: 0.6 mg/dL (ref 0.3–1.2)
Total Protein: 6.5 g/dL (ref 6.0–8.3)

## 2011-09-26 LAB — CEA: CEA: 1.6 ng/mL (ref 0.0–5.0)

## 2011-09-26 LAB — CBC WITH DIFFERENTIAL/PLATELET
BASO%: 0.9 % (ref 0.0–2.0)
Basophils Absolute: 0 10*3/uL (ref 0.0–0.1)
EOS%: 2.6 % (ref 0.0–7.0)
Eosinophils Absolute: 0.1 10*3/uL (ref 0.0–0.5)
HCT: 40.1 % (ref 34.8–46.6)
HGB: 13.4 g/dL (ref 11.6–15.9)
LYMPH%: 25.9 % (ref 14.0–49.7)
MCH: 33.4 pg (ref 25.1–34.0)
MCHC: 33.5 g/dL (ref 31.5–36.0)
MCV: 99.7 fL (ref 79.5–101.0)
MONO#: 0.6 10*3/uL (ref 0.1–0.9)
MONO%: 10.9 % (ref 0.0–14.0)
NEUT#: 3.1 10*3/uL (ref 1.5–6.5)
NEUT%: 59.7 % (ref 38.4–76.8)
Platelets: 224 10*3/uL (ref 145–400)
RBC: 4.02 10*6/uL (ref 3.70–5.45)
RDW: 13.6 % (ref 11.2–14.5)
WBC: 5.1 10*3/uL (ref 3.9–10.3)
lymph#: 1.3 10*3/uL (ref 0.9–3.3)

## 2011-09-27 DIAGNOSIS — H35369 Drusen (degenerative) of macula, unspecified eye: Secondary | ICD-10-CM | POA: Diagnosis not present

## 2011-09-27 DIAGNOSIS — H43819 Vitreous degeneration, unspecified eye: Secondary | ICD-10-CM | POA: Diagnosis not present

## 2011-09-27 DIAGNOSIS — H353190 Nonexudative age-related macular degeneration, unspecified eye, stage unspecified: Secondary | ICD-10-CM | POA: Diagnosis not present

## 2011-09-29 ENCOUNTER — Encounter: Payer: Self-pay | Admitting: Hematology and Oncology

## 2011-09-29 ENCOUNTER — Telehealth: Payer: Self-pay | Admitting: Hematology and Oncology

## 2011-09-29 ENCOUNTER — Ambulatory Visit (HOSPITAL_BASED_OUTPATIENT_CLINIC_OR_DEPARTMENT_OTHER): Payer: Medicare Other | Admitting: Hematology and Oncology

## 2011-09-29 VITALS — BP 165/77 | HR 79 | Temp 98.3°F | Resp 20 | Ht 62.0 in | Wt 182.2 lb

## 2011-09-29 DIAGNOSIS — C182 Malignant neoplasm of ascending colon: Secondary | ICD-10-CM

## 2011-09-29 DIAGNOSIS — Z01419 Encounter for gynecological examination (general) (routine) without abnormal findings: Secondary | ICD-10-CM | POA: Diagnosis not present

## 2011-09-29 DIAGNOSIS — Z124 Encounter for screening for malignant neoplasm of cervix: Secondary | ICD-10-CM | POA: Diagnosis not present

## 2011-09-29 DIAGNOSIS — C189 Malignant neoplasm of colon, unspecified: Secondary | ICD-10-CM

## 2011-09-29 NOTE — Progress Notes (Signed)
This office note has been dictated.

## 2011-09-29 NOTE — Telephone Encounter (Signed)
gve the pt her dec 2013 appt calendar along with the ct scan appt with instructions 

## 2011-09-29 NOTE — Patient Instructions (Signed)
KAJAH SANTIZO  [id number]  [facility] Discharge Instructions  RECOMMENDATIONS MADE BY THE CONSULTANT AND ANY TEST RESULTS WILL BE SENT TO YOUR REFERRING DOCTOR.   EXAM FINDINGS BY MD TODAY AND SIGNS AND SYMPTOMS TO REPORT TO CLINIC OR PRIMARY MD:   Your current list of medications are: Current Outpatient Prescriptions  Medication Sig Dispense Refill  . acetaminophen (TYLENOL) 500 MG tablet Take 500 mg by mouth every 6 (six) hours as needed. For pain      . allopurinol (ZYLOPRIM) 100 MG tablet Take 1 tablet (100 mg total) by mouth daily.  90 tablet  3  . amLODipine (NORVASC) 5 MG tablet Take 1 tablet (5 mg total) by mouth daily.  90 tablet  3  . Ascorbic Acid (VITAMIN C) 100 MG tablet Take 500 mg by mouth daily.       [name] atorvastatin (LIPITOR) 40 MG tablet Take 1 tablet (40 mg total) by mouth daily.  90 tablet  3  . Calcium Carbonate (CALCIUM 600 PO) Take 600 mg by mouth 2 (two) times daily.       . clopidogrel (PLAVIX) 75 MG tablet Take 1 tablet (75 mg total) by mouth daily.  90 tablet  3  . enalapril (VASOTEC) 10 MG tablet Take 1 tablet (10 mg total) by mouth daily.  90 tablet  3  . hydrochlorothiazide (MICROZIDE) 12.5 MG capsule Take 1 capsule (12.5 mg total) by mouth daily.  90 capsule  3  . Loratadine 10 MG CAPS Take 10 mg by mouth daily.       . MULTIPLE VITAMIN PO Take 1 tablet by mouth daily.       . Multiple Vitamins-Minerals (OCUVITE PRESERVISION) TABS Take 1 tablet by mouth 2 (two) times daily.       . fexofenadine (ALLEGRA) 60 MG tablet Take 60 mg by mouth 2 (two) times daily.         INSTRUCTIONS GIVEN AND DISCUSSED:   SPECIAL INSTRUCTIONS/FOLLOW-UP:  See above.  I acknowledge that I have been informed and understand all the instructions given to me and received a copy. I do not have any more questions at this time, but understand that I may call the [facility] at [phone] during business hours should I have any further questions or  need assistance in obtaining follow-up care.

## 2011-09-29 NOTE — Progress Notes (Signed)
CC:   Rosalyn Gess. Norins, MD Wilmon Arms. Tsuei, M.D. Hedwig Morton. Juanda Chance, MD  IDENTIFYING STATEMENT:  The patient is a 78 year old woman with colon cancer who presents for routine followup.  INTERVAL HISTORY:  The patient was seen 4 months ago.  Has no current concerns.  Weight is stable.  Has noted no change in bowel function. Denies rectal bleeding.  Is attempting to exercise more frequently.  MEDICATIONS:  Reviewed and updated.  ALLERGIES:  Aspirin and penicillin.  PHYSICAL EXAMINATION:  General:  The patient is a well-appearing, well- nourished woman in no distress.  Vitals:  Pulse 79, blood pressure 165/77, temperature 98.3, respirations 20, weight 182.2 pounds.  HEENT: Head is atraumatic, normocephalic.  Sclerae are anicteric.  Mouth moist. Chest:  Clear.  CVS:  Unremarkable.  Abdomen:  Soft, nontender.  Bowel sounds present.  Extremities:  No edema.  LABORATORY DATA:  09/26/2011 white cell count 5.1, hemoglobin 13.4, hematocrit 40.1, platelets 224.  Sodium 142, potassium 4.4, chloride 102, CO2 is 32, BUN 21, creatinine 0.84, glucose 85.  T-bili 0.6, alkaline phosphatase 76, AST 21, ALT 15, calcium 9.8.  CEA 1.6.  IMPRESSION AND PLAN:  Mrs. Deguzman is a 78 year old woman who is status post laparoscopic-assisted right hemicolectomy on 01/21/2011 for a T3 N0 (0 of 19 lymph nodes) poorly-differentiated adenocarcinoma of the ascending colon.  Her current workup shows no recurrence.  She will continue with surveillance. She plans for a colonoscopy in November, 2013.  She follows up in 4 months' time with CT scans.    ______________________________ Laurice Record, M.D. LIO/MEDQ  D:  09/29/2011  T:  09/29/2011  Job:  [id number]

## 2011-09-29 NOTE — Telephone Encounter (Signed)
S/w dr brodie's office regarding this pt needing a colonoscopy in nov per April the schedule is not out for nov as yet. Advised the pt and she will call their office to schedule the appt

## 2011-11-10 DIAGNOSIS — M1710 Unilateral primary osteoarthritis, unspecified knee: Secondary | ICD-10-CM | POA: Diagnosis not present

## 2011-11-28 ENCOUNTER — Telehealth: Payer: Self-pay | Admitting: Internal Medicine

## 2011-11-28 NOTE — Telephone Encounter (Signed)
Patient is scheduled on 12/27/11 for OV with Dr. Juanda Chance to discuss colonoscopy f/u colon ca. Patient is worried about getting this OV and then scheduling colonoscopy before she has a CT in December. Moved OV to extender.

## 2011-12-06 ENCOUNTER — Telehealth: Payer: Self-pay | Admitting: *Deleted

## 2011-12-06 ENCOUNTER — Ambulatory Visit (INDEPENDENT_AMBULATORY_CARE_PROVIDER_SITE_OTHER): Payer: Medicare Other | Admitting: Physician Assistant

## 2011-12-06 ENCOUNTER — Ambulatory Visit (INDEPENDENT_AMBULATORY_CARE_PROVIDER_SITE_OTHER): Payer: Medicare Other

## 2011-12-06 ENCOUNTER — Encounter: Payer: Self-pay | Admitting: Physician Assistant

## 2011-12-06 VITALS — BP 128/68 | HR 80 | Ht 62.0 in | Wt 185.5 lb

## 2011-12-06 DIAGNOSIS — Z23 Encounter for immunization: Secondary | ICD-10-CM

## 2011-12-06 DIAGNOSIS — Z7901 Long term (current) use of anticoagulants: Secondary | ICD-10-CM

## 2011-12-06 DIAGNOSIS — Z85038 Personal history of other malignant neoplasm of large intestine: Secondary | ICD-10-CM

## 2011-12-06 MED ORDER — MOVIPREP 100 G PO SOLR: 1.0000 | Freq: Once | ORAL | Status: DC

## 2011-12-06 NOTE — Patient Instructions (Addendum)
You have been scheduled for a colonoscopy with propofol. Please follow written instructions given to you at your visit today.  Please pick up your prep kit at the pharmacy within the next 1-3 days. If you use inhalers (even only as needed), please bring them with you on the day of your procedure.  We have sent the following medication to your pharmacy for you to pick up at your convenience: Moviprep

## 2011-12-06 NOTE — Telephone Encounter (Signed)
12/06/2011    RE: Gina [name] DOB: Feb 15, 1931 MRN: [id number]   Dear Dr. Debby Bud,    We have scheduled the above patient for an endoscopic procedure. Our records show that she is on anticoagulation therapy.   Please advise as to how long the patient may come off her therapy of Plavix prior to the procedure, which is scheduled for 02-01-2012.  Please fax back/ or route the completed form to [facility] at [phone].   Sincerely,  Ok Anis

## 2011-12-06 NOTE — Progress Notes (Signed)
Reviewed, I would be in favor of leaving her on Plavix for the procedure, because of low risk of bleeding from biopsies ans a concern for TIA/CVA if she goes off Plavix. Will have Dr Debby Bud to have an input.

## 2011-12-06 NOTE — Progress Notes (Signed)
Subjective:    Patient ID: Gina [name], female    DOB: November 08, 1931, 78 y.o.   MRN: [id number]  HPI Gina [name] is a pleasant 78 year old white female known to Dr. [name] who was diagnosed with a poorly differentiated right colon cancer in November of 2012 on colonoscopy. She underwent a lap assisted right hemicolectomy. Her tumor was a T3 N0 poorly differentiated adenocarcinoma and she has been under surveillance only since. She comes in today to discuss one-year followup colonoscopy . She has no current GI complaints, has been feeling good. Her bowel movements have been normal and she has not noted any melena or hematochezia. She is on chronic Plavix over the past couple of years since she had a TIA in 2011. This is managed by Dr. Debby Bud. She has appointment scheduled for CT scans and labs on December 10 and a followup appointment with Dr. Dalene Carrow  on December 12. She would like to have her colonoscopy done prior to that time. She has had no previous issues with bowel preps or sedation.    Review of Systems  Constitutional: Negative.   HENT: Negative.   Eyes: Negative.   Respiratory: Negative.   Cardiovascular: Negative.   Gastrointestinal: Negative.   Genitourinary: Negative.   Musculoskeletal: Negative.   Neurological: Negative.   Hematological: Negative.   Psychiatric/Behavioral: Negative.    Outpatient Encounter Prescriptions as of 12/06/2011  Medication Sig Dispense Refill  . acetaminophen (TYLENOL) 500 MG tablet Take 500 mg by mouth every 6 (six) hours as needed. For pain      . allopurinol (ZYLOPRIM) 100 MG tablet Take 1 tablet (100 mg total) by mouth daily.  90 tablet  3  . amLODipine (NORVASC) 5 MG tablet Take 1 tablet (5 mg total) by mouth daily.  90 tablet  3  . Ascorbic Acid (VITAMIN C) 100 MG tablet Take 500 mg by mouth daily.       [name] atorvastatin (LIPITOR) 40 MG tablet Take 1 tablet (40 mg total) by mouth daily.  90 tablet  3  . Calcium Carbonate (CALCIUM 600 PO) Take  600 mg by mouth 2 (two) times daily.       . clopidogrel (PLAVIX) 75 MG tablet Take 1 tablet (75 mg total) by mouth daily.  90 tablet  3  . enalapril (VASOTEC) 10 MG tablet Take 1 tablet (10 mg total) by mouth daily.  90 tablet  3  . hydrochlorothiazide (MICROZIDE) 12.5 MG capsule Take 1 capsule (12.5 mg total) by mouth daily.  90 capsule  3  . MULTIPLE VITAMIN PO Take 1 tablet by mouth daily.       . Multiple Vitamins-Minerals (OCUVITE PRESERVISION) TABS Take 1 tablet by mouth 2 (two) times daily.       . fexofenadine (ALLEGRA) 60 MG tablet Take 60 mg by mouth 2 (two) times daily.      . Loratadine 10 MG CAPS Take 10 mg by mouth daily.       [name] MOVIPREP 100 G SOLR Take 1 kit (100 g total) by mouth once.  1 kit  0   Allergies  Allergen Reactions  . Aspirin     REACTION: Hives  . Penicillins     REACTION: Hives   Patient Active Problem List  Diagnosis  . HYPERLIPIDEMIA  . GOUT  . HYPERTENSION  . Unspecified transient cerebral ischemia  . ALLERGIC RHINITIS  . DIVERTICULOSIS, COLON  . HIP PAIN, LEFT  . COLONIC POLYPS, HYPERPLASTIC, HX OF  . Back pain  .  Cancer of right colon  . Left otitis media  . Vertigo  . Routine health maintenance    History  Substance Use Topics  . Smoking status: Former Smoker -- 1.5 packs/day for 30 years    Types: Cigarettes    Quit date: 07/21/1987  . Smokeless tobacco: Never Used  . Alcohol Use: 8.4 oz/week    14 Glasses of wine per week     Wine daily       Objective:   Physical Exam well-developed elderly white female in no acute distress, pleasant blood pressure 128/68 pulse 80 height 5 foot 2 weight 185. HEENT; nontraumatic normocephalic EOMI PERRLA sclera anicteric, Neck; supple no JVD, Cardiovascular; regular rate and rhythm with S1-S2 no murmur or gallop, Pulmonary; clear bilaterally, Abdomen; soft nondistended nontender no palpable mass or hepatosplenomegaly bowel sounds are active, Rectal not done, Extremities; no clubbing cyanosis or  edema skin warm and dry, Psych; mood and affect normal and appropriate.        Assessment & Plan:  #76 78 year old female with history of a T3 and 0 poorly differentiated carcinoma of the descending colon status post lap assisted right hemicolectomy December 2012-now due for followup surveillance colonoscopy. #2 chronic Plavix use-history of TIA #3 diverticulosis #4 hypertension #5 hyperlipidemia  Plan; schedule for colonoscopy with Dr. [name], on December 18. Procedure was discussed in detail with the patient and she is agreeable to proceed Patient will need to come off of her Plavix for 7 days prior to the procedure, and we will obtain consent from Dr. Debby Bud.

## 2011-12-07 ENCOUNTER — Telehealth: Payer: Self-pay | Admitting: Internal Medicine

## 2011-12-07 ENCOUNTER — Telehealth: Payer: Self-pay | Admitting: *Deleted

## 2011-12-07 ENCOUNTER — Other Ambulatory Visit: Payer: Self-pay | Admitting: *Deleted

## 2011-12-07 NOTE — Telephone Encounter (Signed)
5 days

## 2011-12-07 NOTE — Telephone Encounter (Signed)
Called patient and advised her that per Dr. Debby Bud ok to hold Plavix 5 days before procedure  Patient verbalized understanding

## 2011-12-07 NOTE — Telephone Encounter (Signed)
Nurse Ok Anis notified of Dr. Debby Bud orders.

## 2011-12-07 NOTE — Telephone Encounter (Signed)
Pt called to advise we can take her off the wait list and she will keep her appt for the procedure on 02-01-2012.

## 2011-12-08 ENCOUNTER — Telehealth: Payer: Self-pay | Admitting: *Deleted

## 2011-12-08 NOTE — Telephone Encounter (Signed)
Patient confirmed over the phone:  02-21-2012 at 9:00am MD   02-14-2012 lab only 9:30am  10:30am at Scan

## 2011-12-09 ENCOUNTER — Other Ambulatory Visit: Payer: Self-pay | Admitting: Orthopedic Surgery

## 2011-12-09 DIAGNOSIS — M25562 Pain in left knee: Secondary | ICD-10-CM

## 2011-12-10 ENCOUNTER — Ambulatory Visit
Admission: RE | Admit: 2011-12-10 | Discharge: 2011-12-10 | Disposition: A | Discharge status: Home / Self Care | Payer: Medicare Other | Source: Ambulatory Visit | Attending: Orthopedic Surgery | Admitting: Orthopedic Surgery

## 2011-12-10 DIAGNOSIS — M23329 Other meniscus derangements, posterior horn of medial meniscus, unspecified knee: Secondary | ICD-10-CM | POA: Diagnosis not present

## 2011-12-10 DIAGNOSIS — M25562 Pain in left knee: Secondary | ICD-10-CM

## 2011-12-10 DIAGNOSIS — M25569 Pain in unspecified knee: Secondary | ICD-10-CM | POA: Diagnosis not present

## 2011-12-15 DIAGNOSIS — M25569 Pain in unspecified knee: Secondary | ICD-10-CM | POA: Diagnosis not present

## 2011-12-15 DIAGNOSIS — IMO0002 Reserved for concepts with insufficient information to code with codable children: Secondary | ICD-10-CM | POA: Diagnosis not present

## 2011-12-27 ENCOUNTER — Ambulatory Visit: Payer: Medicare Other | Admitting: Internal Medicine

## 2012-01-10 ENCOUNTER — Encounter: Payer: Medicare Other | Admitting: Internal Medicine

## 2012-01-19 ENCOUNTER — Ambulatory Visit (INDEPENDENT_AMBULATORY_CARE_PROVIDER_SITE_OTHER): Payer: Medicare Other | Admitting: Internal Medicine

## 2012-01-19 ENCOUNTER — Encounter: Payer: Self-pay | Admitting: Internal Medicine

## 2012-01-19 VITALS — BP 132/68 | HR 77 | Temp 98.5°F | Ht 62.0 in | Wt 184.0 lb

## 2012-01-19 DIAGNOSIS — R059 Cough, unspecified: Secondary | ICD-10-CM | POA: Diagnosis not present

## 2012-01-19 DIAGNOSIS — J209 Acute bronchitis, unspecified: Secondary | ICD-10-CM

## 2012-01-19 DIAGNOSIS — R05 Cough: Secondary | ICD-10-CM | POA: Diagnosis not present

## 2012-01-19 MED ORDER — AZITHROMYCIN 250 MG PO TABS: ORAL_TABLET | ORAL | Status: DC

## 2012-01-19 MED ORDER — HYDROCODONE-HOMATROPINE 5-1.5 MG/5ML PO SYRP: 5.0000 mL | ORAL_SOLUTION | Freq: Three times a day (TID) | ORAL | Status: DC | PRN

## 2012-01-19 NOTE — Patient Instructions (Addendum)

## 2012-01-19 NOTE — Progress Notes (Signed)
Subjective:    Patient ID: Gina [name], female    DOB: 05/13/1931, 78 y.o.   MRN: [id number]  HPI  Pt presents to the clinic today with c/o of cough with thick brown sputum production, chest tightness and body aches. This started 4 days ago. She thought that she was just getting a cold but feels this is getting much worse than a cold. The cough is intermittent but much worse at night. It is keeping her awake. She has not tried any OTC products. She does have a history or recurrent bronchitis infections. She had had sick contacts. She denies fever, chills, nausea, vomiting or diarrhea.  Review of Systems      Past Medical History  Diagnosis Date  . Unspecified transient cerebral ischemia sept '11  . Hyperplastic colon polyp   . Diverticulosis of colon (without mention of hemorrhage)   . Hypertension   . Hyperlipidemia   . Gout   . Allergic rhinitis   . GI bleed   . Arthritis   . Stroke     TIA  . Cancer Dec 12    colon    Current Outpatient Prescriptions  Medication Sig Dispense Refill  . acetaminophen (TYLENOL) 500 MG tablet Take 500 mg by mouth every 6 (six) hours as needed. For pain      . allopurinol (ZYLOPRIM) 100 MG tablet Take 1 tablet (100 mg total) by mouth daily.  90 tablet  3  . amLODipine (NORVASC) 5 MG tablet Take 1 tablet (5 mg total) by mouth daily.  90 tablet  3  . Ascorbic Acid (VITAMIN C) 100 MG tablet Take 500 mg by mouth daily.       [name] atorvastatin (LIPITOR) 40 MG tablet Take 1 tablet (40 mg total) by mouth daily.  90 tablet  3  . Calcium Carbonate (CALCIUM 600 PO) Take 600 mg by mouth 2 (two) times daily.       . clopidogrel (PLAVIX) 75 MG tablet Take 1 tablet (75 mg total) by mouth daily.  90 tablet  3  . enalapril (VASOTEC) 10 MG tablet Take 1 tablet (10 mg total) by mouth daily.  90 tablet  3  . fexofenadine (ALLEGRA) 60 MG tablet Take 60 mg by mouth 2 (two) times daily.      . hydrochlorothiazide (MICROZIDE) 12.5 MG capsule Take 1 capsule (12.5 mg  total) by mouth daily.  90 capsule  3  . Loratadine 10 MG CAPS Take 10 mg by mouth daily.       [name] MOVIPREP 100 G SOLR Take 1 kit (100 g total) by mouth once.  1 kit  0  . MULTIPLE VITAMIN PO Take 1 tablet by mouth daily.       . Multiple Vitamins-Minerals (OCUVITE PRESERVISION) TABS Take 1 tablet by mouth 2 (two) times daily.         Allergies  Allergen Reactions  . Aspirin     REACTION: Hives  . Penicillins     REACTION: Hives    Family History  Problem Relation Age of Onset  . Colon cancer Father   . Hypertension Father   . Hyperlipidemia Father   . Coronary artery disease Father   . Ulcers Father   . Heart failure Mother   . Coronary artery disease Mother   . Breast cancer Mother   . Hypertension Mother   . Hyperlipidemia Mother   . Asthma Mother   . Diabetes Neg Hx     History  Social History  . Marital Status: Widowed    Spouse Name: N/A    Number of Children: 3  . Years of Education: 14   Occupational History  . librarian     retired   Social History Main Topics  . Smoking status: Former Smoker -- 1.5 packs/day for 30 years    Types: Cigarettes    Quit date: 07/21/1987  . Smokeless tobacco: Never Used  . Alcohol Use: 8.4 oz/week    14 Glasses of wine per week     Comment: Wine daily  . Drug Use: No  . Sexually Active: Not Currently   Other Topics Concern  . Not on file   Social History Narrative   HSG, Married 1955 widowed 2010. 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 Passed away 2008-06-27 Her son  moved to [place] (July '10). End of life care:  no extra ordinary measures: wants cardiac resusitation and mechanical ventilation short-term if needed. Does not want to be kept in a persistant vegative state or long term artifical life support.      Constitutional: Pt reports fatigue.  Denies fever, malaise, headache or abrupt weight changes.  HEENT: Pt reports sore throat. Denies eye pain, eye redness, ear pain, ringing in the ears, wax buildup, runny nose, nasal congestion, bloody nose. Respiratory:  Pt reports cough with sputum production. Denies difficulty breathing, shortness of breath.   Cardiovascular: Denies chest pain, chest tightness, palpitations or swelling in the hands or feet.  Gastrointestinal: Denies abdominal pain, bloating, constipation, diarrhea or blood in the stool.     No other specific complaints in a complete review of systems (except as listed in HPI above).  Objective:   Physical Exam  BP 132/68  Pulse 77  Temp 98.5 F (36.9 C) (Oral)  Ht 5\' 2"  (1.575 m)  Wt 184 lb (83.462 kg)  BMI 33.65 kg/m2  SpO2 88% Wt Readings from Last 3 Encounters:  01/19/12 184 lb (83.462 kg)  12/06/11 185 lb 8 oz (84.142 kg)  09/29/11 182 lb 3.2 oz (82.645 kg)    General: Appears her stated age, well developed, well nourished in NAD. Skin: Warm, dry and intact. No rashes, lesions or ulcerations noted. HEENT: Head: normal shape and size; Eyes: sclera white, no icterus, conjunctiva pink, PERRLA and EOMs intact; Ears: Tm's gray and intact, normal light reflex; Nose: mucosa pink and moist, septum midline; Throat/Mouth: Teeth present, mucosa erythematous and moist,+ PND, no exudate, lesions or ulcerations noted.  Neck: Normal range of motion. Neck supple, trachea midline. No massses, lumps or thyromegaly present.  Cardiovascular: Normal rate and rhythm. S1,S2 noted.  No murmur, rubs or gallops noted. No JVD or BLE edema. No carotid bruits noted. Pulmonary/Chest: Normal effort and fine rhonchi noted bilaterally. No respiratory distress. No wheezes or rales  noted.        Assessment & Plan:  Acute bronchitis, new onset with additional workup required:  eRx for Azithromax x 5 days Rx faxed for Hycodan cough syrup.  Pt declined chest xray to r/o pneumonia. Stay  well hydrated and get plenty of rest.  RTC as needed or if symptoms persist

## 2012-01-23 ENCOUNTER — Other Ambulatory Visit: Payer: Medicare Other | Admitting: Lab

## 2012-01-24 ENCOUNTER — Other Ambulatory Visit (HOSPITAL_COMMUNITY): Payer: Medicare Other

## 2012-01-24 ENCOUNTER — Other Ambulatory Visit: Payer: Medicare Other | Admitting: Lab

## 2012-01-26 ENCOUNTER — Ambulatory Visit: Payer: Medicare Other | Admitting: Hematology and Oncology

## 2012-02-01 ENCOUNTER — Ambulatory Visit (AMBULATORY_SURGERY_CENTER): Payer: Medicare Other | Admitting: Internal Medicine

## 2012-02-01 ENCOUNTER — Encounter: Payer: Self-pay | Admitting: Internal Medicine

## 2012-02-01 VITALS — BP 138/88 | HR 65 | Temp 96.7°F | Resp 28 | Ht 62.0 in | Wt 185.0 lb

## 2012-02-01 DIAGNOSIS — Z85038 Personal history of other malignant neoplasm of large intestine: Secondary | ICD-10-CM

## 2012-02-01 DIAGNOSIS — I10 Essential (primary) hypertension: Secondary | ICD-10-CM | POA: Diagnosis not present

## 2012-02-01 DIAGNOSIS — C182 Malignant neoplasm of ascending colon: Secondary | ICD-10-CM

## 2012-02-01 DIAGNOSIS — Z800 Family history of malignant neoplasm of digestive organs: Secondary | ICD-10-CM

## 2012-02-01 DIAGNOSIS — Z86010 Personal history of colonic polyps: Secondary | ICD-10-CM | POA: Diagnosis not present

## 2012-02-01 DIAGNOSIS — M109 Gout, unspecified: Secondary | ICD-10-CM | POA: Diagnosis not present

## 2012-02-01 MED ORDER — SODIUM CHLORIDE 0.9 % IV SOLN: 500.0000 mL | INTRAVENOUS | Status: DC

## 2012-02-01 NOTE — Patient Instructions (Addendum)
Handouts were given to your care partner on diverticulosis and high fiber diet.  Next colonoscopy in 2 years.  You may resume your current medications today.  Please call if any questions or concerns.   YOU HAD AN ENDOSCOPIC PROCEDURE TODAY AT THE [facility]: Refer to the procedure report that was given to you for any specific questions about what was found during the examination.  If the procedure report does not answer your questions, please call your gastroenterologist to clarify.  If you requested that your care partner not be given the details of your procedure findings, then the procedure report has been included in a sealed envelope for you to review at your convenience later.  YOU SHOULD EXPECT: Some feelings of bloating in the abdomen. Passage of more gas than usual.  Walking can help get rid of the air that was put into your GI tract during the procedure and reduce the bloating. If you had a lower endoscopy (such as a colonoscopy or flexible sigmoidoscopy) you may notice spotting of blood in your stool or on the toilet paper. If you underwent a bowel prep for your procedure, then you may not have a normal bowel movement for a few days.  DIET: Your first meal following the procedure should be a light meal and then it is ok to progress to your normal diet.  A half-sandwich or bowl of soup is an example of a good first meal.  Heavy or fried foods are harder to digest and may make you feel nauseous or bloated.  Likewise meals heavy in dairy and vegetables can cause extra gas to form and this can also increase the bloating.  Drink plenty of fluids but you should avoid alcoholic beverages for 24 hours.  ACTIVITY: Your care partner should take you home directly after the procedure.  You should plan to take it easy, moving slowly for the rest of the day.  You can resume normal activity the day after the procedure however you should NOT DRIVE or use heavy machinery for 24 hours (because of  the sedation medicines used during the test).    SYMPTOMS TO REPORT IMMEDIATELY: A gastroenterologist can be reached at any hour.  During normal business hours, 8:30 AM to 5:00 PM Monday through Friday, call [phone].  After hours and on weekends, please call the GI answering service at [phone] who will take a message and have the physician on call contact you.   Following lower endoscopy (colonoscopy or flexible sigmoidoscopy):  Excessive amounts of blood in the stool  Significant tenderness or worsening of abdominal pains  Swelling of the abdomen that is new, acute  Fever of 100F or higher   FOLLOW UP: If any biopsies were taken you will be contacted by phone or by letter within the next 1-3 weeks.  Call your gastroenterologist if you have not heard about the biopsies in 3 weeks.  Our staff will call the home number listed on your records the next business day following your procedure to check on you and address any questions or concerns that you may have at that time regarding the information given to you following your procedure. This is a courtesy call and so if there is no answer at the home number and we have not heard from you through the emergency physician on call, we will assume that you have returned to your regular daily activities without incident.  SIGNATURES/CONFIDENTIALITY: You and/or your care partner have signed paperwork which will  be entered into your electronic medical record.  These signatures attest to the fact that that the information above on your After Visit Summary has been reviewed and is understood.  Full responsibility of the confidentiality of this discharge information lies with you and/or your care-partner.

## 2012-02-01 NOTE — Op Note (Signed)
[facility] [address] [place], [address]   COLONOSCOPY PROCEDURE REPORT  PATIENT: Gina, [name]  MR#: [id number] BIRTHDATE: [birth year] , 80  yrs. old GENDER: Female ENDOSCOPIST: Hart Carwin, MD REFERRED XB:JYNWGN colon PROCEDURE DATE:  02/01/2012 PROCEDURE:   Colonoscopy, surveillance ASA CLASS:   Class II INDICATIONS:Patient's family history of colon polyps, Patient's immediate family history of colon cancer, and adenocarcinoma right colon 2011, resected 01/2010. MEDICATIONS: MAC sedation, administered by CRNA and propofol (Diprivan) 200mg  IV  DESCRIPTION OF PROCEDURE:   After the risks benefits and alternatives of the procedure were thoroughly explained, informed consent was obtained.  A digital rectal exam revealed no abnormalities of the rectum.   The LB [id number]  endoscope was introduced through the anus and advanced to the surgical anastomosis. No adverse events experienced.   The quality of the prep was good, using MoviPrep  The instrument was then slowly withdrawn as the colon was fully examined.      COLON FINDINGS: There was evidence of a prior end-to-end ileocolonic surgical anastomosis.   Mild diverticulosis was noted in the descending colon.  Retroflexed views revealed no abnormalities. The time to cecum=  .  Withdrawal time=  .  The scope was withdrawn and the procedure completed. COMPLICATIONS: There were no complications.  ENDOSCOPIC IMPRESSION: 1.   There was evidence of a prior ileocolonic surgical anastomosis , widely patent 2.   Mild diverticulosis was noted in the descending colon  RECOMMENDATIONS: High fiber diet  Recall colonoscopy  2 years eSigned:  Hart Carwin, MD 02/01/2012 2:19 PM   cc:

## 2012-02-01 NOTE — Progress Notes (Signed)
No complaints noted in the recovery room. Maw  Patient did not have preoperative order for IV antibiotic SSI prophylaxis. (G8918) Patient did not experience any of the following events: a burn prior to discharge; a fall within the facility; wrong site/side/patient/procedure/implant event; or a hospital transfer or hospital admission upon discharge from the facility. (G8907)  

## 2012-02-02 ENCOUNTER — Telehealth: Payer: Self-pay | Admitting: *Deleted

## 2012-02-02 NOTE — Telephone Encounter (Signed)
  Follow up Call-  Call back number 02/01/2012 01/11/2011  Post procedure Call Back phone  # [phone], OK TO LEAVE A MESSAGE  Permission to leave phone message Yes -     Patient questions:  Do you have a fever, pain , or abdominal swelling? no Pain Score  0 *  Have you tolerated food without any problems? yes  Have you been able to return to your normal activities? yes  Do you have any questions about your discharge instructions: Diet   no Medications  no Follow up visit  no  Do you have questions or concerns about your Care? no  Actions: * If pain score is 4 or above: No action needed, pain <4.

## 2012-02-04 ENCOUNTER — Telehealth: Payer: Self-pay | Admitting: *Deleted

## 2012-02-04 NOTE — Telephone Encounter (Signed)
Pt notified that Dr. Dalene Carrow was leaving practice and her scheduled appt needed to rescheduled with another provider. Pt was rescheduled to see Dr. Clelia Croft on Jan 7,2014 @ 11:00. Pt agreed with  New appt and provider

## 2012-02-06 ENCOUNTER — Telehealth: Payer: Self-pay | Admitting: Oncology

## 2012-02-06 NOTE — Telephone Encounter (Signed)
Returned pt's call re verify appt d/t's due to she is leaving town on 02/22/12. lmonvm for pt re appts for lb/ct 12/31 and appt w/new provider (FS) 1/7. Pt asked to call me back at my direct number,

## 2012-02-07 ENCOUNTER — Telehealth: Payer: Self-pay | Admitting: Oncology

## 2012-02-07 NOTE — Telephone Encounter (Signed)
Pt returned call and lm stating she did receive my message and will be at her appts for 12/31 and 1/7

## 2012-02-14 ENCOUNTER — Ambulatory Visit (HOSPITAL_COMMUNITY)
Admission: RE | Admit: 2012-02-14 | Discharge: 2012-02-14 | Disposition: A | Discharge status: Home / Self Care | Payer: Medicare Other | Source: Ambulatory Visit | Attending: Hematology and Oncology | Admitting: Hematology and Oncology

## 2012-02-14 ENCOUNTER — Other Ambulatory Visit (HOSPITAL_BASED_OUTPATIENT_CLINIC_OR_DEPARTMENT_OTHER): Payer: Medicare Other | Admitting: Lab

## 2012-02-14 DIAGNOSIS — C189 Malignant neoplasm of colon, unspecified: Secondary | ICD-10-CM | POA: Diagnosis not present

## 2012-02-14 DIAGNOSIS — C182 Malignant neoplasm of ascending colon: Secondary | ICD-10-CM

## 2012-02-14 LAB — CBC WITH DIFFERENTIAL/PLATELET
BASO%: 0.6 % (ref 0.0–2.0)
Basophils Absolute: 0 10*3/uL (ref 0.0–0.1)
EOS%: 3.2 % (ref 0.0–7.0)
Eosinophils Absolute: 0.2 10*3/uL (ref 0.0–0.5)
HCT: 40 % (ref 34.8–46.6)
HGB: 13.7 g/dL (ref 11.6–15.9)
LYMPH%: 21.1 % (ref 14.0–49.7)
MCH: 34.5 pg — ABNORMAL HIGH (ref 25.1–34.0)
MCHC: 34.3 g/dL (ref 31.5–36.0)
MCV: 100.6 fL (ref 79.5–101.0)
MONO#: 0.7 10*3/uL (ref 0.1–0.9)
MONO%: 11.3 % (ref 0.0–14.0)
NEUT#: 3.9 10*3/uL (ref 1.5–6.5)
NEUT%: 63.8 % (ref 38.4–76.8)
Platelets: 210 10*3/uL (ref 145–400)
RBC: 3.97 10*6/uL (ref 3.70–5.45)
RDW: 14.6 % — ABNORMAL HIGH (ref 11.2–14.5)
WBC: 6.2 10*3/uL (ref 3.9–10.3)
lymph#: 1.3 10*3/uL (ref 0.9–3.3)

## 2012-02-14 LAB — COMPREHENSIVE METABOLIC PANEL (CC13)
ALT: 15 U/L (ref 0–55)
AST: 22 U/L (ref 5–34)
Albumin: 3.6 g/dL (ref 3.5–5.0)
Alkaline Phosphatase: 96 U/L (ref 40–150)
BUN: 19 mg/dL (ref 7.0–26.0)
CO2: 29 mEq/L (ref 22–29)
Calcium: 9.6 mg/dL (ref 8.4–10.4)
Chloride: 100 mEq/L (ref 98–107)
Creatinine: 1 mg/dL (ref 0.6–1.1)
Glucose: 99 mg/dl (ref 70–99)
Potassium: 3.9 mEq/L (ref 3.5–5.1)
Sodium: 140 mEq/L (ref 136–145)
Total Bilirubin: 0.72 mg/dL (ref 0.20–1.20)
Total Protein: 7.3 g/dL (ref 6.4–8.3)

## 2012-02-14 LAB — CEA: CEA: 1.3 ng/mL (ref 0.0–5.0)

## 2012-02-14 MED ORDER — IOHEXOL 300 MG/ML  SOLN: 100.0000 mL | Freq: Once | INTRAMUSCULAR | Status: AC | PRN

## 2012-02-18 ENCOUNTER — Encounter: Payer: Self-pay | Admitting: Oncology

## 2012-02-21 ENCOUNTER — Telehealth: Payer: Self-pay | Admitting: Oncology

## 2012-02-21 ENCOUNTER — Ambulatory Visit: Payer: Self-pay | Admitting: Oncology

## 2012-02-21 ENCOUNTER — Ambulatory Visit: Payer: Medicare Other | Admitting: Hematology and Oncology

## 2012-02-21 ENCOUNTER — Ambulatory Visit (HOSPITAL_BASED_OUTPATIENT_CLINIC_OR_DEPARTMENT_OTHER): Payer: Medicare Other | Admitting: Oncology

## 2012-02-21 VITALS — BP 165/72 | HR 78 | Temp 97.2°F | Resp 20 | Ht 62.0 in | Wt 184.6 lb

## 2012-02-21 DIAGNOSIS — C182 Malignant neoplasm of ascending colon: Secondary | ICD-10-CM

## 2012-02-21 DIAGNOSIS — Z85038 Personal history of other malignant neoplasm of large intestine: Secondary | ICD-10-CM | POA: Diagnosis not present

## 2012-02-21 NOTE — Telephone Encounter (Signed)
Gave pt appt for May 2014 lab and MD °

## 2012-02-21 NOTE — Progress Notes (Signed)
Hematology and Oncology Follow Up Visit  Gina [name] [id number] 04-09-1931 79 y.o. 02/21/2012 8:50 AM    Principle Diagnosis: 79 year old woman T3 N0 (0 of 19 lymph nodes) poorly-differentiated adenocarcinoma of the ascending colon. Diagnosed in 01/2011.    Prior Therapy: status post laparoscopic-assisted right hemicolectomy on 01/21/2011    Current therapy: Observation and follow up.   Interim History:  Gina [name] presents today for a follow up visit. She has been doing very well without complaints. Has no current  concerns. Weight is stable. Has noted no change in bowel function. Denies rectal bleeding. Is attempting to exercise more frequently. She had a colonoscopy in 12/2011. No new issues since.   Medications: I have reviewed the patient's current medications. Current outpatient prescriptions:acetaminophen (TYLENOL) 500 MG tablet, Take 500 mg by mouth every 6 (six) hours as needed. For pain, Disp: , Rfl: ;  allopurinol (ZYLOPRIM) 100 MG tablet, Take 1 tablet (100 mg total) by mouth daily., Disp: 90 tablet, Rfl: 3;  amLODipine (NORVASC) 5 MG tablet, Take 1 tablet (5 mg total) by mouth daily., Disp: 90 tablet, Rfl: 3;  Ascorbic Acid (VITAMIN C) 100 MG tablet, Take 500 mg by mouth daily. , Disp: , Rfl:  atorvastatin (LIPITOR) 40 MG tablet, Take 1 tablet (40 mg total) by mouth daily., Disp: 90 tablet, Rfl: 3;  Calcium Carbonate (CALCIUM 600 PO), Take 600 mg by mouth 2 (two) times daily. , Disp: , Rfl: ;  clopidogrel (PLAVIX) 75 MG tablet, Take 1 tablet (75 mg total) by mouth daily., Disp: 90 tablet, Rfl: 3;  enalapril (VASOTEC) 10 MG tablet, Take 1 tablet (10 mg total) by mouth daily., Disp: 90 tablet, Rfl: 3 fexofenadine (ALLEGRA) 60 MG tablet, Take 60 mg by mouth 2 (two) times daily., Disp: , Rfl: ;  hydrochlorothiazide (MICROZIDE) 12.5 MG capsule, Take 1 capsule (12.5 mg total) by mouth daily., Disp: 90 capsule, Rfl: 3;  Loratadine 10 MG CAPS, Take 10 mg by mouth daily. , Disp: ,  Rfl: ;  MULTIPLE VITAMIN PO, Take 1 tablet by mouth daily. , Disp: , Rfl:  Multiple Vitamins-Minerals (OCUVITE PRESERVISION) TABS, Take 1 tablet by mouth 2 (two) times daily. , Disp: , Rfl:   Allergies:  Allergies  Allergen Reactions  . Aspirin     REACTION: Hives  . Penicillins     REACTION: Hives    Past Medical History, Surgical history, Social history, and Family History were reviewed and updated.  Review of Systems: Constitutional:  Negative for fever, chills, night sweats, anorexia, weight loss, pain. Cardiovascular: no chest pain or dyspnea on exertion Respiratory: negative Neurological: negative Dermatological: negative ENT: negative Skin: Negative. Gastrointestinal: negative Genito-Urinary: negative Hematological and Lymphatic: negative Breast: negative Musculoskeletal: negative Remaining ROS negative. Physical Exam: Blood pressure 165/72, pulse 78, temperature 97.2 F (36.2 C), temperature source Oral, resp. rate 20, height 5\' 2"  (1.575 m), weight 184 lb 9.6 oz (83.734 kg). ECOG: 1 General appearance: alert Head: Normocephalic, without obvious abnormality, atraumatic Neck: no adenopathy, no carotid bruit, no JVD, supple, symmetrical, trachea midline and thyroid not enlarged, symmetric, no tenderness/mass/nodules Lymph nodes: Cervical, supraclavicular, and axillary nodes normal. Heart:regular rate and rhythm, S1, S2 normal, no murmur, click, rub or gallop Lung:chest clear, no wheezing, rales, normal symmetric air entry Abdomin: soft, non-tender, without masses or organomegaly EXT:no erythema, induration, or nodules   Lab Results: Lab Results  Component Value Date   WBC 6.2 02/14/2012   HGB 13.7 02/14/2012   HCT 40.0 02/14/2012   MCV 100.6  02/14/2012   PLT 210 02/14/2012     Chemistry      Component Value Date/Time   NA 140 02/14/2012 0925   NA 142 09/26/2011 1016   NA 143 05/30/2011 1150   K 3.9 02/14/2012 0925   K 4.4 09/26/2011 1016   K 3.8 05/30/2011  1150   CL 100 02/14/2012 0925   CL 102 09/26/2011 1016   CL 97* 05/30/2011 1150   CO2 29 02/14/2012 0925   CO2 32 09/26/2011 1016   CO2 30 05/30/2011 1150   BUN 19.0 02/14/2012 0925   BUN 21 09/26/2011 1016   BUN 18 05/30/2011 1150   CREATININE 1.0 02/14/2012 0925   CREATININE 0.84 09/26/2011 1016   CREATININE 0.8 05/30/2011 1150      Component Value Date/Time   CALCIUM 9.6 02/14/2012 0925   CALCIUM 9.8 09/26/2011 1016   CALCIUM 9.6 05/30/2011 1150   ALKPHOS 96 02/14/2012 0925   ALKPHOS 76 09/26/2011 1016   ALKPHOS 78 05/30/2011 1150   AST 22 02/14/2012 0925   AST 21 09/26/2011 1016   AST 28 05/30/2011 1150   ALT 15 02/14/2012 0925   ALT 15 09/26/2011 1016   BILITOT 0.72 02/14/2012 0925   BILITOT 0.6 09/26/2011 1016   BILITOT 0.90 05/30/2011 1150       Radiological Studies: CT CHEST  Findings: Lung windows demonstrate similar 2 mm right lower lobe  subpleural nodule on image 24/series 5. Volume loss within the  posterior lingula. 6 mm focus of subpleural nodularity at the left  lower lobe on image 37/of series 5, unchanged.  No new or enlarging nodules.  Soft tissue windows demonstrate no supraclavicular adenopathy.  Mild cardiomegaly with multivessel coronary artery atherosclerosis.  Trace pericardial fluid thickening which is unchanged. No pleural  fluid. No central pulmonary embolism, on this non-dedicated study.  Pulmonary artery enlargement, with the outflow tract measuring 3.3  cm.  Small stable middle mediastinal nodes, without mediastinal or hilar  adenopathy.  IMPRESSION:  1. No acute process or evidence of metastatic disease in the chest.  2. Similar bilateral pulmonary nodularity.  3. Pulmonary artery enlargement suggests pulmonary arterial  hypertension.  CT ABDOMEN AND PELVIS  Findings: Normal liver. Splenule. Normal stomach, pancreas,  gallbladder, biliary tract, adrenal glands. Punctate left renal  collecting system calculus versus less likely vascular    calcification. Upper pole right renal cyst of 1.0 cm. Interpolar  right renal 14 mm cyst. Too small to characterize lesions within  both kidneys.  No retroperitoneal or retrocrural adenopathy.  Extensive colonic diverticulosis. Status post right hemicolectomy.  Normal small bowel without abdominal ascites.  No evidence of omental or peritoneal disease. No pelvic  adenopathy. Pelvic floor laxity and probable cystocele.  Otherwise, normal appearance of the urinary bladder and uterus,  without adnexal mass or significant free pelvic fluid. Prominent  gonadal veins, especially on the left.  Pelvic anterior wall laxity containing only fat.  No acute osseous abnormality. Accentuation of thoracic kyphosis.  Trace L4-L5 anterolisthesis, likely degenerative.  IMPRESSION:  1. Status post right hemicolectomy. No acute process or evidence  of metastatic disease in the abdomen or pelvis.  2. Probable left nephrolithiasis.  3. Pelvic floor laxity.  4. Prominent gonadal veins, as can be seen with pelvic congestion  syndrome.    Impression and Plan:  Gina. Biancardi is a 79 year old woman with:  1. T3 N0 poorly-differentiated adenocarcinoma of the ascending colon. She is status post laparoscopic-assisted right hemicolectomy on 01/21/2011. Her CT scan and  labs work including a normal CEA was discussed. No evidence to suggest cancer relapse.  I will continue active follow up every 4 months and repeat CT scan in 8 months.   2. Colonoscopy screen: She is up to date with her last one on 12/2011.      [facility], MD 1/7/20148:50 AM

## 2012-03-01 DIAGNOSIS — Z1231 Encounter for screening mammogram for malignant neoplasm of breast: Secondary | ICD-10-CM | POA: Diagnosis not present

## 2012-03-01 DIAGNOSIS — Z803 Family history of malignant neoplasm of breast: Secondary | ICD-10-CM | POA: Diagnosis not present

## 2012-03-13 ENCOUNTER — Encounter: Payer: Self-pay | Admitting: Internal Medicine

## 2012-04-13 DIAGNOSIS — H43819 Vitreous degeneration, unspecified eye: Secondary | ICD-10-CM | POA: Diagnosis not present

## 2012-04-13 DIAGNOSIS — H353190 Nonexudative age-related macular degeneration, unspecified eye, stage unspecified: Secondary | ICD-10-CM | POA: Diagnosis not present

## 2012-04-13 DIAGNOSIS — H35369 Drusen (degenerative) of macula, unspecified eye: Secondary | ICD-10-CM | POA: Diagnosis not present

## 2012-05-31 ENCOUNTER — Ambulatory Visit (INDEPENDENT_AMBULATORY_CARE_PROVIDER_SITE_OTHER): Payer: Medicare Other | Admitting: Internal Medicine

## 2012-05-31 ENCOUNTER — Encounter: Payer: Self-pay | Admitting: Internal Medicine

## 2012-05-31 VITALS — BP 146/72 | HR 77 | Temp 97.9°F | Ht 62.0 in | Wt 185.8 lb

## 2012-05-31 DIAGNOSIS — B029 Zoster without complications: Secondary | ICD-10-CM

## 2012-05-31 MED ORDER — VALACYCLOVIR HCL 1 G PO TABS: 1000.0000 mg | ORAL_TABLET | Freq: Three times a day (TID) | ORAL | Status: DC

## 2012-05-31 NOTE — Patient Instructions (Signed)

## 2012-05-31 NOTE — Progress Notes (Signed)
Subjective:    Patient ID: Gina [name], female    DOB: Mar 29, 1931, 79 y.o.   MRN: [id number]  HPI  Pt presents to the clinic today with c/o  an itchy rash. The rash came up Monday morning. It starts on the right side of her back and extends to the right side of her abdomen. It looked like little blisters in the beginning. They are starting to scab over. She has put some Hydrocortisone cream on it without relief. She has had her shingles vaccine. She has never had shingles before.  Review of Systems      Past Medical History  Diagnosis Date  . Unspecified transient cerebral ischemia sept '11  . Hyperplastic colon polyp   . Diverticulosis of colon (without mention of hemorrhage)   . Hypertension   . Hyperlipidemia   . Gout   . Allergic rhinitis   . GI bleed   . Arthritis   . Stroke     TIA  . Cancer Dec 12    colon    Current Outpatient Prescriptions  Medication Sig Dispense Refill  . acetaminophen (TYLENOL) 500 MG tablet Take 500 mg by mouth every 6 (six) hours as needed. For pain      . allopurinol (ZYLOPRIM) 100 MG tablet Take 1 tablet (100 mg total) by mouth daily.  90 tablet  3  . amLODipine (NORVASC) 5 MG tablet Take 1 tablet (5 mg total) by mouth daily.  90 tablet  3  . Ascorbic Acid (VITAMIN C) 100 MG tablet Take 500 mg by mouth daily.       [name] atorvastatin (LIPITOR) 40 MG tablet Take 1 tablet (40 mg total) by mouth daily.  90 tablet  3  . Calcium Carbonate (CALCIUM 600 PO) Take 600 mg by mouth 2 (two) times daily.       . clopidogrel (PLAVIX) 75 MG tablet Take 1 tablet (75 mg total) by mouth daily.  90 tablet  3  . enalapril (VASOTEC) 10 MG tablet Take 1 tablet (10 mg total) by mouth daily.  90 tablet  3  . fexofenadine (ALLEGRA) 60 MG tablet Take 60 mg by mouth 2 (two) times daily.      . hydrochlorothiazide (MICROZIDE) 12.5 MG capsule Take 1 capsule (12.5 mg total) by mouth daily.  90 capsule  3  . Loratadine 10 MG CAPS Take 10 mg by mouth daily.       .  MULTIPLE VITAMIN PO Take 1 tablet by mouth daily.       . Multiple Vitamins-Minerals (OCUVITE PRESERVISION) TABS Take 1 tablet by mouth 2 (two) times daily.        No current facility-administered medications for this visit.    Allergies  Allergen Reactions  . Aspirin     REACTION: Hives  . Penicillins     REACTION: Hives    Family History  Problem Relation Age of Onset  . Colon cancer Father   . Hypertension Father   . Hyperlipidemia Father   . Coronary artery disease Father   . Ulcers Father   . Heart failure Mother   . Coronary artery disease Mother   . Breast cancer Mother   . Hypertension Mother   . Hyperlipidemia Mother   . Asthma Mother   . Diabetes Neg Hx     History   Social History  . Marital Status: Widowed    Spouse Name: N/A    Number of Children: 3  . Years of Education: 36  Occupational History  . librarian     retired   Social History Main Topics  . Smoking status: Former Smoker -- 1.50 packs/day for 30 years    Types: Cigarettes    Quit date: 07/21/1987  . Smokeless tobacco: Never Used  . Alcohol Use: 8.4 oz/week    14 Glasses of wine per week     Comment: Wine daily  . Drug Use: No  . Sexually Active: Not Currently   Other Topics Concern  . Not on file   Social History Narrative   HSG, Married 1955 widowed 2010. 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 Passed away 06-30-08 Her son  moved to [place] (July '10). End of life care:  no extra ordinary measures: wants cardiac resusitation and mechanical ventilation short-term if needed. Does not want to be kept in a persistant vegative state or long term artifical life support.      Constitutional: Denies fever, malaise, fatigue, headache or abrupt weight changes.   Skin: Pt reports rash on her right back and abdomen. Denies  redness or ulcercations.  Neurological: Denies dizziness, difficulty with memory, difficulty with speech or problems with balance and coordination.   No other specific complaints in a complete review of systems (except as listed in HPI above).  Objective:   Physical Exam   BP 146/72  Pulse 77  Temp(Src) 97.9 F (36.6 C) (Oral)  Ht 5\' 2"  (1.575 m)  Wt 185 lb 12.8 oz (84.278 kg)  BMI 33.97 kg/m2  SpO2 95% Wt Readings from Last 3 Encounters:  05/31/12 185 lb 12.8 oz (84.278 kg)  02/21/12 184 lb 9.6 oz (83.734 kg)  02/01/12 185 lb (83.915 kg)    General: Appears her stated age, well developed, well nourished in NAD. Skin: Grouped vesicular lesions extending from midline of back down to right abdomen in a dermatome pattern, resembles shingles lesions. Some lesions noted to be scabbed over. Cardiovascular: Normal rate and rhythm. S1,S2 noted.  No murmur, rubs or gallops noted. No JVD or BLE edema. No carotid bruits noted. Pulmonary/Chest: Normal effort and positive vesicular breath sounds. No respiratory distress. No wheezes, rales or ronchi noted.   Neurological: Alert and oriented. Cranial nerves II-XII intact. Coordination normal. +DTRs bilaterally.       Assessment & Plan:   Shingles, right side, new onset:  eRx for Valtrex 1000 mg TID x 7 days Pt not having pain, will defer Neurontin at this time Avoid touching the area if at all possible  RTC as needed of if symptoms persist or you develop pain in the area

## 2012-06-20 ENCOUNTER — Other Ambulatory Visit (HOSPITAL_BASED_OUTPATIENT_CLINIC_OR_DEPARTMENT_OTHER): Payer: Medicare Other | Admitting: Lab

## 2012-06-20 ENCOUNTER — Telehealth: Payer: Self-pay | Admitting: Oncology

## 2012-06-20 ENCOUNTER — Ambulatory Visit (HOSPITAL_BASED_OUTPATIENT_CLINIC_OR_DEPARTMENT_OTHER): Payer: Medicare Other | Admitting: Oncology

## 2012-06-20 VITALS — BP 148/60 | HR 82 | Temp 98.1°F | Resp 18 | Ht 62.0 in | Wt 181.5 lb

## 2012-06-20 DIAGNOSIS — C182 Malignant neoplasm of ascending colon: Secondary | ICD-10-CM

## 2012-06-20 LAB — CBC WITH DIFFERENTIAL/PLATELET
BASO%: 0.6 % (ref 0.0–2.0)
Basophils Absolute: 0 10*3/uL (ref 0.0–0.1)
EOS%: 1.7 % (ref 0.0–7.0)
Eosinophils Absolute: 0.1 10*3/uL (ref 0.0–0.5)
HCT: 41.8 % (ref 34.8–46.6)
HGB: 14 g/dL (ref 11.6–15.9)
LYMPH%: 26.1 % (ref 14.0–49.7)
MCH: 33.7 pg (ref 25.1–34.0)
MCHC: 33.6 g/dL (ref 31.5–36.0)
MCV: 100.3 fL (ref 79.5–101.0)
MONO#: 0.4 10*3/uL (ref 0.1–0.9)
MONO%: 8.2 % (ref 0.0–14.0)
NEUT#: 3 10*3/uL (ref 1.5–6.5)
NEUT%: 63.4 % (ref 38.4–76.8)
Platelets: 214 10*3/uL (ref 145–400)
RBC: 4.17 10*6/uL (ref 3.70–5.45)
RDW: 14.4 % (ref 11.2–14.5)
WBC: 4.8 10*3/uL (ref 3.9–10.3)
lymph#: 1.3 10*3/uL (ref 0.9–3.3)

## 2012-06-20 LAB — CEA: CEA: 1.5 ng/mL (ref 0.0–5.0)

## 2012-06-20 LAB — COMPREHENSIVE METABOLIC PANEL (CC13)
ALT: 23 U/L (ref 0–55)
AST: 27 U/L (ref 5–34)
Albumin: 3.6 g/dL (ref 3.5–5.0)
Alkaline Phosphatase: 81 U/L (ref 40–150)
BUN: 16.8 mg/dL (ref 7.0–26.0)
CO2: 30 mEq/L — ABNORMAL HIGH (ref 22–29)
Calcium: 9.8 mg/dL (ref 8.4–10.4)
Chloride: 101 mEq/L (ref 98–107)
Creatinine: 1 mg/dL (ref 0.6–1.1)
Glucose: 120 mg/dl — ABNORMAL HIGH (ref 70–99)
Potassium: 3.8 mEq/L (ref 3.5–5.1)
Sodium: 142 mEq/L (ref 136–145)
Total Bilirubin: 0.72 mg/dL (ref 0.20–1.20)
Total Protein: 7.1 g/dL (ref 6.4–8.3)

## 2012-06-20 NOTE — Progress Notes (Signed)
Hematology and Oncology Follow Up Visit  Gina [name] [id number] [birth year] 79 y.o. 06/20/2012 9:58 AM    Principle Diagnosis: 79 year old woman T3 N0 (0 of 19 lymph nodes) poorly-differentiated adenocarcinoma of the ascending colon. Diagnosed in 01/2011.   Prior Therapy: status post laparoscopic-assisted right hemicolectomy on 01/21/2011   Current therapy: Observation and follow up.   Interim History:  Gina [name] presents today for a follow up visit. She is a pleasant Gina [name] with the above diagnosis. She has been doing very well without complaints since her last visit. She did spend one month in [place] and developed Zoster that has resolved now. Weight is stable. Has noted no change in bowel function. She reports loose bowel habits at times. Denies rectal bleeding. She had a colonoscopy in 12/2011.   Medications: I have reviewed the patient's current medications. Current outpatient prescriptions:acetaminophen (TYLENOL) 500 MG tablet, Take 500 mg by mouth every 6 (six) hours as needed. For pain, Disp: , Rfl: ;  allopurinol (ZYLOPRIM) 100 MG tablet, Take 1 tablet (100 mg total) by mouth daily., Disp: 90 tablet, Rfl: 3;  amLODipine (NORVASC) 5 MG tablet, Take 1 tablet (5 mg total) by mouth daily., Disp: 90 tablet, Rfl: 3;  Ascorbic Acid (VITAMIN C) 100 MG tablet, Take 500 mg by mouth daily. , Disp: , Rfl:  atorvastatin (LIPITOR) 40 MG tablet, Take 1 tablet (40 mg total) by mouth daily., Disp: 90 tablet, Rfl: 3;  Calcium Carbonate (CALCIUM 600 PO), Take 600 mg by mouth 2 (two) times daily. , Disp: , Rfl: ;  clopidogrel (PLAVIX) 75 MG tablet, Take 1 tablet (75 mg total) by mouth daily., Disp: 90 tablet, Rfl: 3;  enalapril (VASOTEC) 10 MG tablet, Take 1 tablet (10 mg total) by mouth daily., Disp: 90 tablet, Rfl: 3 fexofenadine (ALLEGRA) 60 MG tablet, Take 60 mg by mouth 2 (two) times daily., Disp: , Rfl: ;  hydrochlorothiazide (MICROZIDE) 12.5 MG capsule, Take 1 capsule (12.5 mg total) by mouth  daily., Disp: 90 capsule, Rfl: 3;  Loratadine 10 MG CAPS, Take 10 mg by mouth daily. , Disp: , Rfl: ;  MULTIPLE VITAMIN PO, Take 1 tablet by mouth daily. , Disp: , Rfl:  Multiple Vitamins-Minerals (OCUVITE PRESERVISION) TABS, Take 1 tablet by mouth 2 (two) times daily. , Disp: , Rfl:   Allergies:  Allergies  Allergen Reactions  . Aspirin     REACTION: Hives  . Penicillins     REACTION: Hives    Past Medical History, Surgical history, Social history, and Family History were reviewed and updated.  Review of Systems: Constitutional:  Negative for fever, chills, night sweats, anorexia, weight loss, pain. Cardiovascular: no chest pain or dyspnea on exertion Respiratory: negative Neurological: negative Dermatological: negative ENT: negative Skin: Negative. Gastrointestinal: negative Genito-Urinary: negative Hematological and Lymphatic: negative Breast: negative Musculoskeletal: negative Remaining ROS negative. Physical Exam: Blood pressure 148/60, pulse 82, temperature 98.1 F (36.7 C), temperature source Oral, resp. rate 18, height 5\' 2"  (1.575 m), weight 181 lb 8 oz (82.328 kg). ECOG: 1 General appearance: alert Head: Normocephalic, without obvious abnormality, atraumatic Neck: no adenopathy, no carotid bruit, no JVD, supple, symmetrical, trachea midline and thyroid not enlarged, symmetric, no tenderness/mass/nodules Lymph nodes: Cervical, supraclavicular, and axillary nodes normal. Heart:regular rate and rhythm, S1, S2 normal, no murmur, click, rub or gallop Lung:chest clear, no wheezing, rales, normal symmetric air entry Abdomin: soft, non-tender, without masses or organomegaly EXT:no erythema, induration, or nodules   Lab Results: Lab Results  Component Value Date  WBC 4.8 06/20/2012   HGB 14.0 06/20/2012   HCT 41.8 06/20/2012   MCV 100.3 06/20/2012   PLT 214 06/20/2012     Chemistry      Component Value Date/Time   NA 140 02/14/2012 0925   NA 142 09/26/2011 1016   NA 143  05/30/2011 1150   K 3.9 02/14/2012 0925   K 4.4 09/26/2011 1016   K 3.8 05/30/2011 1150   CL 100 02/14/2012 0925   CL 102 09/26/2011 1016   CL 97* 05/30/2011 1150   CO2 29 02/14/2012 0925   CO2 32 09/26/2011 1016   CO2 30 05/30/2011 1150   BUN 19.0 02/14/2012 0925   BUN 21 09/26/2011 1016   BUN 18 05/30/2011 1150   CREATININE 1.0 02/14/2012 0925   CREATININE 0.84 09/26/2011 1016   CREATININE 0.8 05/30/2011 1150      Component Value Date/Time   CALCIUM 9.6 02/14/2012 0925   CALCIUM 9.8 09/26/2011 1016   CALCIUM 9.6 05/30/2011 1150   ALKPHOS 96 02/14/2012 0925   ALKPHOS 76 09/26/2011 1016   ALKPHOS 78 05/30/2011 1150   AST 22 02/14/2012 0925   AST 21 09/26/2011 1016   AST 28 05/30/2011 1150   ALT 15 02/14/2012 0925   ALT 15 09/26/2011 1016   BILITOT 0.72 02/14/2012 0925   BILITOT 0.6 09/26/2011 1016   BILITOT 0.90 05/30/2011 1150     Impression and Plan:  Gina [name] is a 79 year old woman with:  1. T3 N0 poorly-differentiated adenocarcinoma of the ascending colon. She is status post laparoscopic-assisted right hemicolectomy on 01/21/2011. Her CT scan and labs from 01/2012 showed no evidence to suggest cancer relapse.  I will continue active follow up and repeat CT scan in 4 months.   2. Colonoscopy screen: She is up to date with her last one on 12/2011.      [facility], [place], MD 5/7/20149:58 AM

## 2012-07-10 ENCOUNTER — Encounter: Payer: Self-pay | Admitting: Internal Medicine

## 2012-07-10 ENCOUNTER — Ambulatory Visit (INDEPENDENT_AMBULATORY_CARE_PROVIDER_SITE_OTHER): Payer: Medicare Other | Admitting: Internal Medicine

## 2012-07-10 VITALS — BP 140/84 | HR 79 | Temp 98.3°F | Ht 62.0 in | Wt 181.0 lb

## 2012-07-10 DIAGNOSIS — J069 Acute upper respiratory infection, unspecified: Secondary | ICD-10-CM | POA: Diagnosis not present

## 2012-07-10 NOTE — Patient Instructions (Addendum)
Viral URI - no indication for antibiotics  Plan Hydrate, vitamin C, consider ecchinacea, rest  For cough- daytime take Robitussin DM, for night time use phenergan with codeine  Continue nasal saline.  Try an aromatic like metholatum or vicks to help open you up, especially good with a stove top vaporizer  For fever or purulent drainage or continue problems send me a message via Mychart.

## 2012-07-10 NOTE — Progress Notes (Signed)
Subjective:    Patient ID: Gina [name], female    DOB: [birth year], 79 y.o.   MRN: [id number]  HPI Jaydeen presents with a 3 day h/o sinus congestion, rhinitis, cough that is not very productive. She has not had any fever. No SOB, no N/V, no UTI symptoms, no chest pain.   Past Medical History  Diagnosis Date  . Unspecified transient cerebral ischemia sept '11  . Hyperplastic colon polyp   . Diverticulosis of colon (without mention of hemorrhage)   . Hypertension   . Hyperlipidemia   . Gout   . Allergic rhinitis   . GI bleed   . Arthritis   . Stroke     TIA  . Cancer Dec 12    colon   Past Surgical History  Procedure Laterality Date  . Breast lumpectomy      right breast '86  . Tonsillectomy    . Back surgery  April '12    benign cyst spine ([facility])  . Colon surgery  Dec '12    colectomy for colon cancer ([facility])   Family History  Problem Relation Age of Onset  . Colon cancer Father   . Hypertension Father   . Hyperlipidemia Father   . Coronary artery disease Father   . Ulcers Father   . Heart failure Mother   . Coronary artery disease Mother   . Breast cancer Mother   . Hypertension Mother   . Hyperlipidemia Mother   . Asthma Mother   . Diabetes Neg Hx    History   Social History  . Marital Status: Widowed    Spouse Name: N/A    Number of Children: 3  . Years of Education: 14   Occupational History  . librarian     retired   Social History Main Topics  . Smoking status: Former Smoker -- 1.50 packs/day for 30 years    Types: Cigarettes    Quit date: 07/21/1987  . Smokeless tobacco: Never Used  . Alcohol Use: 8.4 oz/week    14 Glasses of wine per week     Comment: Wine daily  . Drug Use: No  . Sexually Active: Not Currently   Other Topics Concern  . Not on file   Social History Narrative   HSG, Married 1955 widowed 2010. 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 Passed away 05-Jul-2008 Her son  moved to [place] (July '10). End of life care:  no extra ordinary measures: wants cardiac resusitation and mechanical ventilation short-term if needed. Does not want to be kept in a persistant vegative state or long term artifical life support.     Current Outpatient Prescriptions on File Prior to Visit  Medication Sig Dispense Refill  . acetaminophen (TYLENOL) 500 MG tablet Take 500 mg by mouth every 6 (six) hours as needed. For pain      . allopurinol (ZYLOPRIM) 100 MG tablet Take 1 tablet (100 mg total) by mouth daily.  90 tablet  3  . amLODipine (NORVASC) 5 MG tablet Take 1 tablet (5 mg total) by mouth daily.  90 tablet  3  . Ascorbic Acid (VITAMIN C) 100 MG tablet Take 500 mg by mouth daily.       [name] atorvastatin (LIPITOR) 40 MG tablet Take 1 tablet (40 mg total) by mouth daily.  90 tablet  3  . Calcium Carbonate (CALCIUM 600 PO) Take 600 mg by mouth 2 (two) times daily.       . clopidogrel (PLAVIX) 75 MG tablet Take 1 tablet (75 mg total) by mouth daily.  90 tablet  3  . enalapril (VASOTEC) 10 MG tablet Take 1 tablet (10 mg total) by mouth daily.  90 tablet  3  . hydrochlorothiazide (MICROZIDE) 12.5 MG capsule Take 1 capsule (12.5 mg total) by mouth daily.  90 capsule  3  . Loratadine 10 MG CAPS Take 10 mg by mouth daily.       . MULTIPLE VITAMIN PO Take 1 tablet by mouth daily.       . Multiple Vitamins-Minerals (OCUVITE PRESERVISION) TABS Take 1 tablet by mouth 2 (two) times daily.        No current facility-administered medications on file prior to visit.     Review of Systems System review is negative for any constitutional, cardiac, pulmonary, GI or neuro symptoms or complaints other than as described in the HPI. \    Objective:   Physical Exam Filed Vitals:   07/10/12 1700  BP: 140/84  Pulse: 79  Temp: 98.3 F (36.8 C)   Gen;l- overweight white woman in  no acute distress HEENT- TMs normal, throat clear,no sinus tenderness Pulm - good air movement w/o rales or wheezes Cor- RRR       Assessment & Plan:  Viral URI - no need for antibiotics Plan - supportive care - see AVS

## 2012-07-12 ENCOUNTER — Encounter: Payer: Self-pay | Admitting: Internal Medicine

## 2012-09-04 ENCOUNTER — Ambulatory Visit (INDEPENDENT_AMBULATORY_CARE_PROVIDER_SITE_OTHER): Payer: Medicare Other | Admitting: Internal Medicine

## 2012-09-04 ENCOUNTER — Encounter: Payer: Self-pay | Admitting: Internal Medicine

## 2012-09-04 ENCOUNTER — Other Ambulatory Visit (INDEPENDENT_AMBULATORY_CARE_PROVIDER_SITE_OTHER): Payer: Medicare Other

## 2012-09-04 VITALS — BP 158/80 | HR 62 | Temp 97.9°F | Wt 182.0 lb

## 2012-09-04 DIAGNOSIS — M109 Gout, unspecified: Secondary | ICD-10-CM

## 2012-09-04 DIAGNOSIS — C189 Malignant neoplasm of colon, unspecified: Secondary | ICD-10-CM

## 2012-09-04 DIAGNOSIS — I10 Essential (primary) hypertension: Secondary | ICD-10-CM

## 2012-09-04 DIAGNOSIS — E785 Hyperlipidemia, unspecified: Secondary | ICD-10-CM

## 2012-09-04 DIAGNOSIS — Z0000 Encounter for general adult medical examination without abnormal findings: Secondary | ICD-10-CM | POA: Diagnosis not present

## 2012-09-04 DIAGNOSIS — C182 Malignant neoplasm of ascending colon: Secondary | ICD-10-CM

## 2012-09-04 DIAGNOSIS — G459 Transient cerebral ischemic attack, unspecified: Secondary | ICD-10-CM

## 2012-09-04 DIAGNOSIS — M549 Dorsalgia, unspecified: Secondary | ICD-10-CM

## 2012-09-04 LAB — LIPID PANEL
Cholesterol: 160 mg/dL (ref 0–200)
HDL: 58.3 mg/dL (ref >39.00)
Total CHOL/HDL Ratio: 3
Triglycerides: 202 mg/dL — ABNORMAL HIGH (ref 0.0–149.0)
VLDL: 40.4 mg/dL — ABNORMAL HIGH (ref 0.0–40.0)

## 2012-09-04 LAB — TSH: TSH: 2.27 u[IU]/mL (ref 0.35–5.50)

## 2012-09-04 LAB — URIC ACID: Uric Acid, Serum: 6.4 mg/dL (ref 2.4–7.0)

## 2012-09-04 LAB — LDL CHOLESTEROL, DIRECT: Direct LDL: 73.3 mg/dL

## 2012-09-04 MED ORDER — ALLOPURINOL 100 MG PO TABS: 100.0000 mg | ORAL_TABLET | Freq: Every day | ORAL | Status: DC

## 2012-09-04 MED ORDER — ENALAPRIL MALEATE 10 MG PO TABS: 10.0000 mg | ORAL_TABLET | Freq: Every day | ORAL | Status: DC

## 2012-09-04 MED ORDER — CLOPIDOGREL BISULFATE 75 MG PO TABS: 75.0000 mg | ORAL_TABLET | Freq: Every day | ORAL | Status: DC

## 2012-09-04 MED ORDER — ATORVASTATIN CALCIUM 40 MG PO TABS: 40.0000 mg | ORAL_TABLET | Freq: Every day | ORAL | Status: DC

## 2012-09-04 MED ORDER — AMLODIPINE BESYLATE 5 MG PO TABS: 5.0000 mg | ORAL_TABLET | Freq: Every day | ORAL | Status: DC

## 2012-09-04 MED ORDER — HYDROCHLOROTHIAZIDE 12.5 MG PO CAPS: 12.5000 mg | ORAL_CAPSULE | Freq: Every day | ORAL | Status: DC

## 2012-09-04 NOTE — Assessment & Plan Note (Signed)
BP Readings from Last 3 Encounters:  09/04/12 158/80  07/10/12 140/84  06/20/12 148/60   Running a little high today. Generally ok control per JNC 8 recommendations  Plan Continue present medication  Monitor BP at home and relay any high reading via MyChart.

## 2012-09-04 NOTE — Assessment & Plan Note (Signed)
Regular follow up with Dr. Clelia Croft. No complaints of change in bowel habit over past 7 months.   Plan Surveillance per Dr. Clelia Croft.

## 2012-09-04 NOTE — Patient Instructions (Addendum)
So very good to see you...Marland KitchenGreat grand mom!!!!  All looks well. Pace yourself and I agree with you that no surgery is the best surgery.  [facility] This will really help avoid the knife  Lab today: Lipid panel, Uric acid re: gout control and thyroid lab (TSH) - report my MyChart or mail.  Rule #1 - ENJOY.

## 2012-09-04 NOTE — Assessment & Plan Note (Signed)
Stable with no recurrence.  Plan  Continue anticoagulation  Risk factor modification.

## 2012-09-04 NOTE — Assessment & Plan Note (Addendum)
Taking and tolerating atorvastatin 40 mg.  Plan Lipid panel with recommendations to follow: LDL is better than goal of 80 or less!  Continue present medication.

## 2012-09-04 NOTE — Assessment & Plan Note (Signed)
Stable DDD lumbar spine - mild limitation in walking and endurance but not debilitating.  Plan Water exercise to maintain flexibility and strength

## 2012-09-04 NOTE — Assessment & Plan Note (Signed)
Interval history is benign. Limited physical exam is normal. Labs pending: lipid, thyroid, uric acid. She is current with colorectal surveillance and breast cancer screening. Immunizations are up to date. She is encouraged to develop a regular exercise program - water classes.  In summary - a delightful woman who appears to be medically stable. She will return as needed.

## 2012-09-04 NOTE — Progress Notes (Signed)
Subjective:    Patient ID: Gina [name], female    DOB: 17-Nov-1931, 79 y.o.   MRN: [id number]  HPI The patient is here for annual Medicare wellness examination and management of other chronic and acute problems.  She has just returned from a trip to [place] - had a good trip. She has been doing well. She is limited in walking and other activities due to Lumbar disc disease. She does not have chronic/constant pain: with rest her pain will resolve.    The risk factors are reflected in the social history.  The roster of all physicians providing medical care to patient - is listed in the Snapshot section of the chart.  Activities of daily living:  The patient is 100% inedpendent in all ADLs: dressing, toileting, feeding as well as independent mobility  Home safety : The patient has smoke detectors in the home. Falls - no falls in the last year. Home is fall safe.They wear seatbelts. No firearms at home. There is no violence in the home.   There is no risks for hepatitis, STDs or HIV. There is no history of blood transfusion. They have no travel history to infectious disease endemic areas of the world.  The patient has seen their dentist in the last six month. They have seen their eye doctor in the last year. Followed for "dry" macular degeneration and her vision may be improving.  They deny any hearing difficulty and have not had audiologic testing in the last year.    They do not  have excessive sun exposure. Discussed the need for sun protection: hats, long sleeves and use of sunscreen if there is significant sun exposure.   Diet: the importance of a healthy diet is discussed. They do have a healthy diet.  The patient has no regular exercise program.  The benefits of regular aerobic exercise were discussed.  Depression screen: there are no signs or vegative symptoms of depression- irritability, change in appetite, anhedonia, sadness/tearfullness.  Cognitive assessment: the patient  manages all their financial and personal affairs and is actively engaged.   The following portions of the patient's history were reviewed and updated as appropriate: allergies, current medications, past family history, past medical history,  past surgical history, past social history  and problem list.  Vision, hearing, body mass index were assessed and reviewed.   During the course of the visit the patient was educated and counseled about appropriate screening and preventive services including : fall prevention , diabetes screening, nutrition counseling, colorectal cancer screening, and recommended immunizations.  Past Medical History  Diagnosis Date  . Unspecified transient cerebral ischemia sept '11  . Hyperplastic colon polyp   . Diverticulosis of colon (without mention of hemorrhage)   . Hypertension   . Hyperlipidemia   . Gout   . Allergic rhinitis   . GI bleed   . Arthritis   . Stroke     TIA  . Cancer Dec 12    colon   Past Surgical History  Procedure Laterality Date  . Breast lumpectomy      right breast '86  . Tonsillectomy    . Back surgery  April '12    benign cyst spine ([facility])  . Colon surgery  Dec '12    colectomy for colon cancer ([facility])   Family History  Problem Relation Age of Onset  . Colon cancer Father   . Hypertension Father   . Hyperlipidemia Father   . Coronary artery disease Father   .  Ulcers Father   . Heart failure Mother   . Coronary artery disease Mother   . Breast cancer Mother   . Hypertension Mother   . Hyperlipidemia Mother   . Asthma Mother   . Diabetes Neg Hx    History   Social History  . Marital Status: Widowed    Spouse Name: N/A    Number of Children: 3  . Years of Education: 14   Occupational History  . librarian     retired   Social History Main Topics  . Smoking status: Former Smoker -- 1.50 packs/day for 30 years    Types: Cigarettes    Quit date: 07/21/1987  . Smokeless tobacco: Never Used  . Alcohol Use:  8.4 oz/week    14 Glasses of wine per week     Comment: Wine daily  . Drug Use: No  . Sexually Active: Not Currently   Other Topics Concern  . Not on file   Social History Narrative   HSG, Married 1955 widowed 2010. 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 Passed away Jul 13, 2008 Her son  moved to [place] (July '10). End of life care:  no extra ordinary measures: wants cardiac resusitation and mechanical ventilation short-term if needed. Does not want to be kept in a persistant vegative state or long term artifical life support.     Current Outpatient Prescriptions on File Prior to Visit  Medication Sig Dispense Refill  . acetaminophen (TYLENOL) 500 MG tablet Take 500 mg by mouth every 6 (six) hours as needed. For pain      . Ascorbic Acid (VITAMIN C) 100 MG tablet Take 500 mg by mouth daily.       . Calcium Carbonate (CALCIUM 600 PO) Take 600 mg by mouth 2 (two) times daily.       . Loratadine 10 MG CAPS Take 10 mg by mouth daily.       . MULTIPLE VITAMIN PO Take 1 tablet by mouth daily.       . Multiple Vitamins-Minerals (OCUVITE PRESERVISION) TABS Take 1 tablet by mouth 2 (two) times daily.        No current facility-administered medications on file prior to visit.      Review of Systems Constitutional:  Negative for fever, chills, activity change and unexpected weight change.  HEENT:  Negative for hearing loss, ear pain, congestion, neck stiffness and postnasal drip. Negative for sore throat or swallowing problems. Negative for dental complaints.   Eyes: vision lis stable if not better. Has close follow-up.  Respiratory: Negative for chest tightness and wheezing. Negative for DOE.   Cardiovascular: Negative for chest pain or palpitations. No decreased exercise tolerance Gastrointestinal: No change in bowel habit.  No bloating or gas. No reflux or indigestion Genitourinary: Negative for urgency, frequency, flank pain and difficulty urinating. Stress incontinence. Musculoskeletal: Negative for myalgias, arthralgias and gait problem. Has back pain, worse with walking. Neurological: Negative for dizziness, tremors, weakness and headaches.  Hematological: Negative for adenopathy.  Psychiatric/Behavioral: Negative for behavioral problems and dysphoric mood.       Objective:   Physical Exam Filed Vitals:   09/04/12 0852  BP: 158/80  Pulse: 62  Temp: 97.9 F (36.6 C)   Wt Readings from Last 3 Encounters:  09/04/12 182 lb (82.555 kg)  07/10/12 181 lb (82.101 kg)  06/20/12  181 lb 8 oz (82.328 kg)   Gen'l: well nourished, well developed Woman in no distress HEENT - [place]/AT, EACs/TMs normal, oropharynx with native dentition in good condition, no buccal or palatal lesions, posterior pharynx clear, mucous membranes moist. C&S clear, PERRLA, fundi - deferred to ophthal Neck - supple, no thyromegaly Nodes- negative submental, cervical, supraclavicular regions Chest - no deformity, no CVAT Lungs - clear without rales, wheezes. No increased work of breathing Breast - deferred to Dr. [place] Cardiovascular - regular rate and rhythm, quiet precordium, no murmurs, rubs or gallops, 2+ radial, DP and PT pulses Abdomen - BS+ x 4, no HSM, no guarding or rebound or tenderness Pelvic - deferred to gyn Rectal - deferred to GI Extremities - no clubbing, cyanosis, edema or deformity.  Neuro - A&O x 3, CN II-XII normal, motor strength normal and equal, DTRs 2+ and symmetrical biceps, radial, and patellar tendons. Cerebellar - no tremor, no rigidity, fluid movement and normal gait. Derm - Head, neck, back, abdomen and extremities without suspicious lesions  Recent Results (from the past 2160 hour(s))  CBC WITH DIFFERENTIAL     Status: None   Collection Time    06/20/12  9:22 AM      Result Value Range   WBC 4.8  3.9 -  10.3 10e3/uL   NEUT# 3.0  1.5 - 6.5 10e3/uL   HGB 14.0  11.6 - 15.9 g/dL   HCT 16.1  09.6 - 04.5 %   Platelets 214  145 - 400 10e3/uL   MCV 100.3  79.5 - 101.0 fL   MCH 33.7  25.1 - 34.0 pg   MCHC 33.6  31.5 - 36.0 g/dL   RBC 4.09  8.11 - 9.14 10e6/uL   RDW 14.4  11.2 - 14.5 %   lymph# 1.3  0.9 - 3.3 10e3/uL   MONO# 0.4  0.1 - 0.9 10e3/uL   Eosinophils Absolute 0.1  0.0 - 0.5 10e3/uL   Basophils Absolute 0.0  0.0 - 0.1 10e3/uL   NEUT% 63.4  38.4 - 76.8 %   LYMPH% 26.1  14.0 - 49.7 %   MONO% 8.2  0.0 - 14.0 %   EOS% 1.7  0.0 - 7.0 %   BASO% 0.6  0.0 - 2.0 %  COMPREHENSIVE METABOLIC PANEL (CC13)     Status: Abnormal   Collection Time    06/20/12  9:22 AM      Result Value Range   Sodium 142  136 - 145 mEq/L   Potassium 3.8  3.5 - 5.1 mEq/L   Chloride 101  98 - 107 mEq/L   CO2 30 (*) 22 - 29 mEq/L   Glucose 120 (*) 70 - 99 mg/dl   BUN 78.2  7.0 - 95.6 mg/dL   Creatinine 1.0  0.6 - 1.1 mg/dL   Total Bilirubin 2.13  0.20 - 1.20 mg/dL   Alkaline Phosphatase 81  40 - 150 U/L   AST 27  5 - 34 U/L   ALT 23  0 - 55 U/L   Total Protein 7.1  6.4 - 8.3 g/dL   Albumin 3.6  3.5 - 5.0 g/dL   Calcium 9.8  8.4 - 08.6 mg/dL  CEA     Status: None   Collection Time    06/20/12  9:22 AM      Result Value Range   CEA 1.5  0.0 - 5.0 ng/mL  LIPID PANEL     Status: Abnormal   Collection Time    09/04/12  9:58 AM      Result Value Range   Cholesterol 160  0 - 200 mg/dL   Comment: ATP III Classification       Desirable:  < 200 mg/dL               Borderline High:  200 - 239 mg/dL          High:  > = 161 mg/dL   Triglycerides 096.0 (*) 0.0 - 149.0 mg/dL   Comment: Normal:  <454 mg/dLBorderline High:  150 - 199 mg/dL   HDL 09.81  >19.14 mg/dL   VLDL 78.2 (*) 0.0 - 95.6 mg/dL   Total CHOL/HDL Ratio 3     Comment:                Men          Women1/2 Average Risk     3.4          3.3Average Risk          5.0          4.42X Average Risk          9.6          7.13X Average Risk          15.0           11.0                      TSH     Status: None   Collection Time    09/04/12  9:58 AM      Result Value Range   TSH 2.27  0.35 - 5.50 uIU/mL  URIC ACID     Status: None   Collection Time    09/04/12  9:58 AM      Result Value Range   Uric Acid, Serum 6.4  2.4 - 7.0 mg/dL  LDL CHOLESTEROL, DIRECT     Status: None   Collection Time    09/04/12  9:58 AM      Result Value Range   Direct LDL 73.3     Comment: Optimal:  <100 mg/dLNear or Above Optimal:  100-129 mg/dLBorderline High:  130-159 mg/dLHigh:  160-189 mg/dLVery High:  >190 mg/dL         Assessment & Plan:

## 2012-09-04 NOTE — Assessment & Plan Note (Addendum)
NO report of gout flare since last visit.  Plan Uric acid level today - in normal range  Continue allopurinol.

## 2012-10-02 DIAGNOSIS — Z01419 Encounter for gynecological examination (general) (routine) without abnormal findings: Secondary | ICD-10-CM | POA: Diagnosis not present

## 2012-10-02 DIAGNOSIS — Z124 Encounter for screening for malignant neoplasm of cervix: Secondary | ICD-10-CM | POA: Diagnosis not present

## 2012-10-05 ENCOUNTER — Telehealth: Payer: Self-pay | Admitting: *Deleted

## 2012-10-05 NOTE — Telephone Encounter (Signed)
Pt called and lvm stating she believes she has a UTI. Wants to know if Dr Debby Bud will call her in an rx for cipro? Call back [phone].

## 2012-10-06 MED ORDER — CIPROFLOXACIN HCL 250 MG PO TABS: 250.0000 mg | ORAL_TABLET | Freq: Two times a day (BID) | ORAL | Status: DC

## 2012-10-06 NOTE — Telephone Encounter (Signed)
Called patient - left msg. Cipro sent to pharmacy: 250 bid x 5 days.

## 2012-10-09 DIAGNOSIS — H353190 Nonexudative age-related macular degeneration, unspecified eye, stage unspecified: Secondary | ICD-10-CM | POA: Diagnosis not present

## 2012-10-09 DIAGNOSIS — H35369 Drusen (degenerative) of macula, unspecified eye: Secondary | ICD-10-CM | POA: Diagnosis not present

## 2012-10-10 DIAGNOSIS — D235 Other benign neoplasm of skin of trunk: Secondary | ICD-10-CM | POA: Diagnosis not present

## 2012-10-10 DIAGNOSIS — L259 Unspecified contact dermatitis, unspecified cause: Secondary | ICD-10-CM | POA: Diagnosis not present

## 2012-10-23 ENCOUNTER — Other Ambulatory Visit: Payer: Medicare Other

## 2012-10-23 ENCOUNTER — Ambulatory Visit (HOSPITAL_COMMUNITY)
Admission: RE | Admit: 2012-10-23 | Discharge: 2012-10-23 | Disposition: A | Discharge status: Home / Self Care | Payer: Medicare Other | Source: Ambulatory Visit | Attending: Oncology | Admitting: Oncology

## 2012-10-23 ENCOUNTER — Encounter (HOSPITAL_COMMUNITY): Payer: Self-pay

## 2012-10-23 ENCOUNTER — Other Ambulatory Visit (HOSPITAL_BASED_OUTPATIENT_CLINIC_OR_DEPARTMENT_OTHER): Payer: Medicare Other | Admitting: Lab

## 2012-10-23 DIAGNOSIS — M5137 Other intervertebral disc degeneration, lumbosacral region: Secondary | ICD-10-CM | POA: Insufficient documentation

## 2012-10-23 DIAGNOSIS — N281 Cyst of kidney, acquired: Secondary | ICD-10-CM | POA: Insufficient documentation

## 2012-10-23 DIAGNOSIS — M51379 Other intervertebral disc degeneration, lumbosacral region without mention of lumbar back pain or lower extremity pain: Secondary | ICD-10-CM | POA: Insufficient documentation

## 2012-10-23 DIAGNOSIS — I2510 Atherosclerotic heart disease of native coronary artery without angina pectoris: Secondary | ICD-10-CM | POA: Insufficient documentation

## 2012-10-23 DIAGNOSIS — C189 Malignant neoplasm of colon, unspecified: Secondary | ICD-10-CM | POA: Insufficient documentation

## 2012-10-23 DIAGNOSIS — C182 Malignant neoplasm of ascending colon: Secondary | ICD-10-CM

## 2012-10-23 DIAGNOSIS — I700 Atherosclerosis of aorta: Secondary | ICD-10-CM | POA: Diagnosis not present

## 2012-10-23 DIAGNOSIS — R918 Other nonspecific abnormal finding of lung field: Secondary | ICD-10-CM | POA: Insufficient documentation

## 2012-10-23 DIAGNOSIS — K5730 Diverticulosis of large intestine without perforation or abscess without bleeding: Secondary | ICD-10-CM | POA: Diagnosis not present

## 2012-10-23 LAB — CBC WITH DIFFERENTIAL/PLATELET
BASO%: 1 % (ref 0.0–2.0)
Basophils Absolute: 0.1 10*3/uL (ref 0.0–0.1)
EOS%: 2.3 % (ref 0.0–7.0)
Eosinophils Absolute: 0.1 10*3/uL (ref 0.0–0.5)
HCT: 40.6 % (ref 34.8–46.6)
HGB: 13.7 g/dL (ref 11.6–15.9)
LYMPH%: 25 % (ref 14.0–49.7)
MCH: 33.2 pg (ref 25.1–34.0)
MCHC: 33.8 g/dL (ref 31.5–36.0)
MCV: 98.3 fL (ref 79.5–101.0)
MONO#: 0.6 10*3/uL (ref 0.1–0.9)
MONO%: 10.6 % (ref 0.0–14.0)
NEUT#: 3.3 10*3/uL (ref 1.5–6.5)
NEUT%: 61.1 % (ref 38.4–76.8)
Platelets: 232 10*3/uL (ref 145–400)
RBC: 4.13 10*6/uL (ref 3.70–5.45)
RDW: 14.2 % (ref 11.2–14.5)
WBC: 5.5 10*3/uL (ref 3.9–10.3)
lymph#: 1.4 10*3/uL (ref 0.9–3.3)

## 2012-10-23 LAB — COMPREHENSIVE METABOLIC PANEL (CC13)
ALT: 19 U/L (ref 0–55)
AST: 24 U/L (ref 5–34)
Albumin: 3.7 g/dL (ref 3.5–5.0)
Alkaline Phosphatase: 78 U/L (ref 40–150)
BUN: 21 mg/dL (ref 7.0–26.0)
CO2: 33 mEq/L — ABNORMAL HIGH (ref 22–29)
Calcium: 9.8 mg/dL (ref 8.4–10.4)
Chloride: 100 mEq/L (ref 98–109)
Creatinine: 0.8 mg/dL (ref 0.6–1.1)
Glucose: 97 mg/dl (ref 70–140)
Potassium: 4 mEq/L (ref 3.5–5.1)
Sodium: 142 mEq/L (ref 136–145)
Total Bilirubin: 0.72 mg/dL (ref 0.20–1.20)
Total Protein: 7.3 g/dL (ref 6.4–8.3)

## 2012-10-23 MED ORDER — IOHEXOL 300 MG/ML  SOLN
100.0000 mL | Freq: Once | INTRAMUSCULAR | Status: AC | PRN
  Administered 2012-10-23: 100 mL via INTRAVENOUS

## 2012-10-24 LAB — CEA: CEA: 2 ng/mL (ref 0.0–5.0)

## 2012-10-25 ENCOUNTER — Ambulatory Visit (HOSPITAL_BASED_OUTPATIENT_CLINIC_OR_DEPARTMENT_OTHER): Payer: Medicare Other | Admitting: Oncology

## 2012-10-25 ENCOUNTER — Telehealth: Payer: Self-pay | Admitting: Oncology

## 2012-10-25 VITALS — BP 158/60 | HR 73 | Temp 98.7°F | Resp 18 | Ht 62.0 in | Wt 185.8 lb

## 2012-10-25 DIAGNOSIS — C182 Malignant neoplasm of ascending colon: Secondary | ICD-10-CM | POA: Diagnosis not present

## 2012-10-25 NOTE — Telephone Encounter (Signed)
gv and printed appt sched and avs for pt for Jan 2015 °

## 2012-10-25 NOTE — Progress Notes (Signed)
Hematology and Oncology Follow Up Visit  Gina [name] [id number] 1931-08-07 79 y.o. 10/25/2012 9:12 AM    Principle Diagnosis: 79 year old woman T3 N0 (0 of 19 lymph nodes) poorly-differentiated adenocarcinoma of the ascending colon. Diagnosed in 01/2011.   Prior Therapy: status post laparoscopic-assisted right hemicolectomy on 01/21/2011   Current therapy: Observation and follow up.   Interim History:  Gina [name] presents today for a follow up visit. She is a pleasant women with the above diagnosis. She has been doing very well without complaints since her last visit. She reports no new GI symptoms. Weight is stable. Has noted no change in bowel function. She reports loose bowel habits at times. Denies rectal bleeding. She had a colonoscopy in 12/2011. She reports loose bowel movements and takes imodium with good success. She still able to travel and visit her sons in the [place] area.    Medications: I have reviewed the patient's current medications. Current Outpatient Prescriptions  Medication Sig Dispense Refill  . acetaminophen (TYLENOL) 500 MG tablet Take 500 mg by mouth every 6 (six) hours as needed. For pain      . allopurinol (ZYLOPRIM) 100 MG tablet Take 1 tablet (100 mg total) by mouth daily.  90 tablet  3  . amLODipine (NORVASC) 5 MG tablet Take 1 tablet (5 mg total) by mouth daily.  90 tablet  3  . Ascorbic Acid (VITAMIN C) 100 MG tablet Take 500 mg by mouth daily.       [name] atorvastatin (LIPITOR) 40 MG tablet Take 1 tablet (40 mg total) by mouth daily.  90 tablet  3  . Calcium Carbonate (CALCIUM 600 PO) Take 600 mg by mouth 2 (two) times daily.       . clopidogrel (PLAVIX) 75 MG tablet Take 1 tablet (75 mg total) by mouth daily.  90 tablet  3  . enalapril (VASOTEC) 10 MG tablet Take 1 tablet (10 mg total) by mouth daily.  90 tablet  3  . hydrochlorothiazide (MICROZIDE) 12.5 MG capsule Take 1 capsule (12.5 mg total) by mouth daily.  90 capsule  3  . Loratadine 10 MG CAPS Take  10 mg by mouth daily.       . MULTIPLE VITAMIN PO Take 1 tablet by mouth daily.       . Multiple Vitamins-Minerals (OCUVITE PRESERVISION) TABS Take 1 tablet by mouth 2 (two) times daily.        No current facility-administered medications for this visit.    Allergies:  Allergies  Allergen Reactions  . Aspirin     REACTION: Hives  . Penicillins     REACTION: Hives    Past Medical History, Surgical history, Social history, and Family History were reviewed and updated.  Review of Systems: Remaining ROS negative. Physical Exam: Blood pressure 158/60, pulse 73, temperature 98.7 F (37.1 C), temperature source Oral, resp. rate 18, height 5\' 2"  (1.575 m), weight 185 lb 12.8 oz (84.278 kg), SpO2 98.00%. ECOG: 1 General appearance: alert Head: Normocephalic, without obvious abnormality, atraumatic Neck: no adenopathy, no carotid bruit, no JVD, supple, symmetrical, trachea midline and thyroid not enlarged, symmetric, no tenderness/mass/nodules Lymph nodes: Cervical, supraclavicular, and axillary nodes normal. Heart:regular rate and rhythm, S1, S2 normal, no murmur, click, rub or gallop Lung:chest clear, no wheezing, rales, normal symmetric air entry Abdomin: soft, non-tender, without masses or organomegaly EXT:no erythema, induration, or nodules   Lab Results: Lab Results  Component Value Date   WBC 5.5 10/23/2012   HGB 13.7 10/23/2012  HCT 40.6 10/23/2012   MCV 98.3 10/23/2012   PLT 232 10/23/2012     Chemistry      Component Value Date/Time   NA 142 10/23/2012 1156   NA 142 09/26/2011 1016   NA 143 05/30/2011 1150   K 4.0 10/23/2012 1156   K 4.4 09/26/2011 1016   K 3.8 05/30/2011 1150   CL 101 06/20/2012 0922   CL 102 09/26/2011 1016   CL 97* 05/30/2011 1150   CO2 33* 10/23/2012 1156   CO2 32 09/26/2011 1016   CO2 30 05/30/2011 1150   BUN 21.0 10/23/2012 1156   BUN 21 09/26/2011 1016   BUN 18 05/30/2011 1150   CREATININE 0.8 10/23/2012 1156   CREATININE 0.84 09/26/2011 1016   CREATININE 0.8  05/30/2011 1150      Component Value Date/Time   CALCIUM 9.8 10/23/2012 1156   CALCIUM 9.8 09/26/2011 1016   CALCIUM 9.6 05/30/2011 1150   ALKPHOS 78 10/23/2012 1156   ALKPHOS 76 09/26/2011 1016   ALKPHOS 78 05/30/2011 1150   AST 24 10/23/2012 1156   AST 21 09/26/2011 1016   AST 28 05/30/2011 1150   ALT 19 10/23/2012 1156   ALT 15 09/26/2011 1016   ALT 26 05/30/2011 1150   BILITOT 0.72 10/23/2012 1156   BILITOT 0.6 09/26/2011 1016   BILITOT 0.90 05/30/2011 1150     CT CHEST, ABDOMEN, AND PELVIS WITH CONTRAST  TECHNIQUE:  Multidetector CT imaging of the chest, abdomen and pelvis was  performed following the standard protocol during bolus  administration of intravenous contrast.  CONTRAST: OMNIPAQUE IOHEXOL 300 MG/ML SOLN  COMPARISON: 02/14/2012  FINDINGS:  CT CHEST FINDINGS  Heart is normal size. Aorta is normal caliber. Calcifications  throughout the coronary arteries and aorta. No mediastinal, hilar,  or axillary adenopathy. Small scattered mediastinal lymph nodes,  stable. Visualized thyroid and chest wall soft tissues unremarkable.  Tiny pulmonary nodule posteriorly in the right lower lobe on image  28, stable. Subpleural nodule in the left lower lobe on image 38 is  stable, measuring approximately 7 mm compared with 6 mm previously.  No new pulmonary nodules. No pleural effusions.  Degenerative changes in the thoracic spine. No acute or focal bony  abnormality.  CT ABDOMEN AND PELVIS FINDINGS  Changes of prior right hemicolectomy. No recurrent mass at the  surgical site. Liver, contracted gallbladder, spleen, pancreas,  adrenals are unremarkable. Small cysts in the kidneys bilaterally.  These are stable. No hydronephrosis.  Small mesenteric lymph node in the right lower quadrant on image 25.  This is stable. No new or enlarging mesenteric lymph nodes. Aorta,  iliac vessels and branch vessels are heavily calcified, non  aneurysmal. Uterus, adnexae and urinary bladder unremarkable.   Sigmoid diverticulosis without active diverticulitis. Small bowel is  decompressed.  No acute bony abnormality. Degenerative disc and facet disease in  the lumbar spine.  IMPRESSION:  CT CHEST IMPRESSION  No evidence of metastatic disease to the chest. Stable small  scattered pulmonary nodules.  Densely calcified coronary arteries and aorta.  CT ABDOMEN AND PELVIS IMPRESSION  Prior right hemicolectomy. No evidence of recurrent or metastatic  disease.  No acute findings.  Impression and Plan:  Gina [name] is a 79 year old woman with:  1. T3 N0 poorly-differentiated adenocarcinoma of the ascending colon. She is status post laparoscopic-assisted right hemicolectomy on 01/21/2011. Her CT scan and labs from 10/23/2012 was discussed today and showed no evidence to suggest cancer relapse.  I will continue active  follow up and repeat CT scan in 8- 12 months. She will return for a clinic visit in 4 months.    2. Colonoscopy screen: She is up to date with her last one on 12/2011. Next one will be in 2015.  3. Loose bowel movements: She takes imodium as needed.      [facility], MD 9/11/20149:12 AM

## 2012-11-07 ENCOUNTER — Ambulatory Visit (INDEPENDENT_AMBULATORY_CARE_PROVIDER_SITE_OTHER): Payer: Medicare Other

## 2012-11-07 DIAGNOSIS — Z23 Encounter for immunization: Secondary | ICD-10-CM | POA: Diagnosis not present

## 2013-02-05 DIAGNOSIS — D485 Neoplasm of uncertain behavior of skin: Secondary | ICD-10-CM | POA: Diagnosis not present

## 2013-02-05 DIAGNOSIS — L259 Unspecified contact dermatitis, unspecified cause: Secondary | ICD-10-CM | POA: Diagnosis not present

## 2013-02-20 ENCOUNTER — Telehealth: Payer: Self-pay | Admitting: Oncology

## 2013-02-20 ENCOUNTER — Other Ambulatory Visit (HOSPITAL_BASED_OUTPATIENT_CLINIC_OR_DEPARTMENT_OTHER): Payer: Medicare Other

## 2013-02-20 ENCOUNTER — Ambulatory Visit (HOSPITAL_BASED_OUTPATIENT_CLINIC_OR_DEPARTMENT_OTHER): Payer: Medicare Other | Admitting: Oncology

## 2013-02-20 VITALS — BP 141/69 | HR 84 | Temp 97.5°F | Resp 18 | Ht 62.0 in | Wt 186.6 lb

## 2013-02-20 DIAGNOSIS — C182 Malignant neoplasm of ascending colon: Secondary | ICD-10-CM | POA: Diagnosis not present

## 2013-02-20 LAB — CBC WITH DIFFERENTIAL/PLATELET
BASO%: 1.1 % (ref 0.0–2.0)
Basophils Absolute: 0.1 10*3/uL (ref 0.0–0.1)
EOS%: 2.7 % (ref 0.0–7.0)
Eosinophils Absolute: 0.1 10*3/uL (ref 0.0–0.5)
HCT: 40.8 % (ref 34.8–46.6)
HGB: 13.8 g/dL (ref 11.6–15.9)
LYMPH%: 25 % (ref 14.0–49.7)
MCH: 33.8 pg (ref 25.1–34.0)
MCHC: 33.7 g/dL (ref 31.5–36.0)
MCV: 100 fL (ref 79.5–101.0)
MONO#: 0.5 10*3/uL (ref 0.1–0.9)
MONO%: 10 % (ref 0.0–14.0)
NEUT#: 3.2 10*3/uL (ref 1.5–6.5)
NEUT%: 61.2 % (ref 38.4–76.8)
Platelets: 236 10*3/uL (ref 145–400)
RBC: 4.08 10*6/uL (ref 3.70–5.45)
RDW: 13.7 % (ref 11.2–14.5)
WBC: 5.3 10*3/uL (ref 3.9–10.3)
lymph#: 1.3 10*3/uL (ref 0.9–3.3)

## 2013-02-20 LAB — CEA: CEA: 1.8 ng/mL (ref 0.0–5.0)

## 2013-02-20 LAB — COMPREHENSIVE METABOLIC PANEL (CC13)
ALT: 20 U/L (ref 0–55)
AST: 24 U/L (ref 5–34)
Albumin: 3.9 g/dL (ref 3.5–5.0)
Alkaline Phosphatase: 76 U/L (ref 40–150)
Anion Gap: 9 mEq/L (ref 3–11)
BUN: 26.3 mg/dL — ABNORMAL HIGH (ref 7.0–26.0)
CO2: 30 mEq/L — ABNORMAL HIGH (ref 22–29)
Calcium: 9.6 mg/dL (ref 8.4–10.4)
Chloride: 103 mEq/L (ref 98–109)
Creatinine: 0.9 mg/dL (ref 0.6–1.1)
Glucose: 105 mg/dl (ref 70–140)
Potassium: 3.8 mEq/L (ref 3.5–5.1)
Sodium: 142 mEq/L (ref 136–145)
Total Bilirubin: 0.7 mg/dL (ref 0.20–1.20)
Total Protein: 7 g/dL (ref 6.4–8.3)

## 2013-02-20 NOTE — Telephone Encounter (Signed)
gv and printed appt sched and avs for pt for April...gv pt barium

## 2013-02-20 NOTE — Progress Notes (Signed)
Hematology and Oncology Follow Up Visit  Gina [name] [id number] 02-28-1931 80 y.o. 02/20/2013 9:42 AM    Principle Diagnosis: 80 year old woman T3 N0 (0 of 19 lymph nodes) poorly-differentiated adenocarcinoma of the ascending colon. Diagnosed in 01/2011.   Prior Therapy: status post laparoscopic-assisted right hemicolectomy on 01/21/2011   Current therapy: Observation and follow up.   Interim History:  Gina [name] presents today for a follow up visit. She is a pleasant women with the above diagnosis. She has been doing very well without complaints since her last visit. She reports no new GI symptoms. Her weight remains stable. Has noted no change in bowel function. She reports loose bowel habits at times. Denies rectal bleeding. She had a colonoscopy in 12/2011. She reports loose bowel movements and takes imodium with good success. She still able to travel and had multiple trips last fall to [place] in other places. Her performance status and activity level remains at baseline.  Medications: I have reviewed the patient's current medications. Current Outpatient Prescriptions  Medication Sig Dispense Refill  . acetaminophen (TYLENOL) 500 MG tablet Take 500 mg by mouth every 6 (six) hours as needed. For pain      . allopurinol (ZYLOPRIM) 100 MG tablet Take 1 tablet (100 mg total) by mouth daily.  90 tablet  3  . amLODipine (NORVASC) 5 MG tablet Take 1 tablet (5 mg total) by mouth daily.  90 tablet  3  . Ascorbic Acid (VITAMIN C) 100 MG tablet Take 500 mg by mouth daily.       [name] atorvastatin (LIPITOR) 40 MG tablet Take 1 tablet (40 mg total) by mouth daily.  90 tablet  3  . Calcium Carbonate (CALCIUM 600 PO) Take 600 mg by mouth 2 (two) times daily.       . clopidogrel (PLAVIX) 75 MG tablet Take 1 tablet (75 mg total) by mouth daily.  90 tablet  3  . enalapril (VASOTEC) 10 MG tablet Take 1 tablet (10 mg total) by mouth daily.  90 tablet  3  . hydrochlorothiazide (MICROZIDE) 12.5 MG  capsule Take 1 capsule (12.5 mg total) by mouth daily.  90 capsule  3  . Loratadine 10 MG CAPS Take 10 mg by mouth daily.       . MULTIPLE VITAMIN PO Take 1 tablet by mouth daily.       . Multiple Vitamins-Minerals (OCUVITE PRESERVISION) TABS Take 1 tablet by mouth 2 (two) times daily.        No current facility-administered medications for this visit.    Allergies:  Allergies  Allergen Reactions  . Aspirin     REACTION: Hives  . Penicillins     REACTION: Hives    Past Medical History, Surgical history, Social history, and Family History were reviewed and updated.  Review of Systems: Remaining ROS negative. Physical Exam: Blood pressure 141/69, pulse 84, temperature 97.5 F (36.4 C), temperature source Oral, resp. rate 18, height 5\' 2"  (1.575 m), weight 186 lb 9.6 oz (84.641 kg). ECOG: 1 General appearance: alert Head: Normocephalic, without obvious abnormality, atraumatic Neck: no adenopathy, no carotid bruit, no JVD, supple, symmetrical, trachea midline and thyroid not enlarged, symmetric, no tenderness/mass/nodules Lymph nodes: Cervical, supraclavicular, and axillary nodes normal. Heart:regular rate and rhythm, S1, S2 normal, no murmur, click, rub or gallop Lung:chest clear, no wheezing, rales, normal symmetric air entry Abdomin: soft, non-tender, without masses or organomegaly EXT:no erythema, induration, or nodules   Lab Results: Lab Results  Component Value Date  WBC 5.3 02/20/2013   HGB 13.8 02/20/2013   HCT 40.8 02/20/2013   MCV 100.0 02/20/2013   PLT 236 02/20/2013     Chemistry      Component Value Date/Time   NA 142 10/23/2012 1156   NA 142 09/26/2011 1016   NA 143 05/30/2011 1150   K 4.0 10/23/2012 1156   K 4.4 09/26/2011 1016   K 3.8 05/30/2011 1150   CL 101 06/20/2012 0922   CL 102 09/26/2011 1016   CL 97* 05/30/2011 1150   CO2 33* 10/23/2012 1156   CO2 32 09/26/2011 1016   CO2 30 05/30/2011 1150   BUN 21.0 10/23/2012 1156   BUN 21 09/26/2011 1016   BUN 18 05/30/2011  1150   CREATININE 0.8 10/23/2012 1156   CREATININE 0.84 09/26/2011 1016   CREATININE 0.8 05/30/2011 1150      Component Value Date/Time   CALCIUM 9.8 10/23/2012 1156   CALCIUM 9.8 09/26/2011 1016   CALCIUM 9.6 05/30/2011 1150   ALKPHOS 78 10/23/2012 1156   ALKPHOS 76 09/26/2011 1016   ALKPHOS 78 05/30/2011 1150   AST 24 10/23/2012 1156   AST 21 09/26/2011 1016   AST 28 05/30/2011 1150   ALT 19 10/23/2012 1156   ALT 15 09/26/2011 1016   ALT 26 05/30/2011 1150   BILITOT 0.72 10/23/2012 1156   BILITOT 0.6 09/26/2011 1016   BILITOT 0.90 05/30/2011 1150      Impression and Plan:  Gina. [name] is a 80 year old woman with:  1. T3 N0 poorly-differentiated adenocarcinoma of the ascending colon. She is status post laparoscopic-assisted right hemicolectomy on 01/21/2011. Her CT scan and labs from 10/23/2012 showed no evidence to suggest cancer relapse.  I will continue active follow up and repeat CT scan in April 2015 and annually after that. She will probably need followup every 6 months after that as well.  2. Colonoscopy screen: She is up to date with her last one on 12/2011. Next one will be in 12/2013.  3. Loose bowel movements: She takes imodium as needed.      [facility], MD 1/7/20159:42 AM

## 2013-02-22 ENCOUNTER — Encounter: Payer: Self-pay | Admitting: Oncology

## 2013-03-21 DIAGNOSIS — Z1231 Encounter for screening mammogram for malignant neoplasm of breast: Secondary | ICD-10-CM | POA: Diagnosis not present

## 2013-03-21 DIAGNOSIS — Z803 Family history of malignant neoplasm of breast: Secondary | ICD-10-CM | POA: Diagnosis not present

## 2013-04-09 ENCOUNTER — Encounter: Payer: Self-pay | Admitting: Internal Medicine

## 2013-05-16 DIAGNOSIS — J40 Bronchitis, not specified as acute or chronic: Secondary | ICD-10-CM | POA: Diagnosis not present

## 2013-05-31 DIAGNOSIS — H353190 Nonexudative age-related macular degeneration, unspecified eye, stage unspecified: Secondary | ICD-10-CM | POA: Diagnosis not present

## 2013-05-31 DIAGNOSIS — Z961 Presence of intraocular lens: Secondary | ICD-10-CM | POA: Diagnosis not present

## 2013-05-31 DIAGNOSIS — H1045 Other chronic allergic conjunctivitis: Secondary | ICD-10-CM | POA: Diagnosis not present

## 2013-06-11 ENCOUNTER — Ambulatory Visit (HOSPITAL_COMMUNITY)
Admission: RE | Admit: 2013-06-11 | Discharge: 2013-06-11 | Disposition: A | Discharge status: Home / Self Care | Payer: Medicare Other | Source: Ambulatory Visit | Attending: Oncology | Admitting: Oncology

## 2013-06-11 ENCOUNTER — Other Ambulatory Visit (HOSPITAL_BASED_OUTPATIENT_CLINIC_OR_DEPARTMENT_OTHER): Payer: Medicare Other

## 2013-06-11 DIAGNOSIS — C189 Malignant neoplasm of colon, unspecified: Secondary | ICD-10-CM | POA: Insufficient documentation

## 2013-06-11 DIAGNOSIS — N281 Cyst of kidney, acquired: Secondary | ICD-10-CM | POA: Diagnosis not present

## 2013-06-11 DIAGNOSIS — J984 Other disorders of lung: Secondary | ICD-10-CM | POA: Diagnosis not present

## 2013-06-11 DIAGNOSIS — C182 Malignant neoplasm of ascending colon: Secondary | ICD-10-CM

## 2013-06-11 LAB — CEA: CEA: 1.7 ng/mL (ref 0.0–5.0)

## 2013-06-11 LAB — CBC WITH DIFFERENTIAL/PLATELET
BASO%: 0.6 % (ref 0.0–2.0)
Basophils Absolute: 0 10*3/uL (ref 0.0–0.1)
EOS%: 2.4 % (ref 0.0–7.0)
Eosinophils Absolute: 0.1 10*3/uL (ref 0.0–0.5)
HCT: 42.5 % (ref 34.8–46.6)
HGB: 13.8 g/dL (ref 11.6–15.9)
LYMPH%: 28.4 % (ref 14.0–49.7)
MCH: 32.2 pg (ref 25.1–34.0)
MCHC: 32.5 g/dL (ref 31.5–36.0)
MCV: 99.3 fL (ref 79.5–101.0)
MONO#: 0.5 10*3/uL (ref 0.1–0.9)
MONO%: 9.7 % (ref 0.0–14.0)
NEUT#: 3 10*3/uL (ref 1.5–6.5)
NEUT%: 58.9 % (ref 38.4–76.8)
Platelets: 236 10*3/uL (ref 145–400)
RBC: 4.28 10*6/uL (ref 3.70–5.45)
RDW: 14.9 % — ABNORMAL HIGH (ref 11.2–14.5)
WBC: 5 10*3/uL (ref 3.9–10.3)
lymph#: 1.4 10*3/uL (ref 0.9–3.3)

## 2013-06-11 LAB — COMPREHENSIVE METABOLIC PANEL (CC13)
ALT: 20 U/L (ref 0–55)
AST: 26 U/L (ref 5–34)
Albumin: 4 g/dL (ref 3.5–5.0)
Alkaline Phosphatase: 75 U/L (ref 40–150)
Anion Gap: 12 mEq/L — ABNORMAL HIGH (ref 3–11)
BUN: 18.9 mg/dL (ref 7.0–26.0)
CO2: 27 mEq/L (ref 22–29)
Calcium: 10.1 mg/dL (ref 8.4–10.4)
Chloride: 103 mEq/L (ref 98–109)
Creatinine: 0.9 mg/dL (ref 0.6–1.1)
Glucose: 111 mg/dl (ref 70–140)
Potassium: 4 mEq/L (ref 3.5–5.1)
Sodium: 142 mEq/L (ref 136–145)
Total Bilirubin: 0.91 mg/dL (ref 0.20–1.20)
Total Protein: 7.5 g/dL (ref 6.4–8.3)

## 2013-06-11 MED ORDER — IOHEXOL 300 MG/ML  SOLN
100.0000 mL | Freq: Once | INTRAMUSCULAR | Status: AC | PRN
  Administered 2013-06-11: 100 mL via INTRAVENOUS

## 2013-06-13 ENCOUNTER — Encounter: Payer: Self-pay | Admitting: Oncology

## 2013-06-13 ENCOUNTER — Ambulatory Visit (HOSPITAL_BASED_OUTPATIENT_CLINIC_OR_DEPARTMENT_OTHER): Payer: Medicare Other | Admitting: Oncology

## 2013-06-13 VITALS — BP 148/68 | HR 78 | Temp 98.7°F | Resp 18 | Ht 62.0 in | Wt 183.4 lb

## 2013-06-13 DIAGNOSIS — C182 Malignant neoplasm of ascending colon: Secondary | ICD-10-CM

## 2013-06-13 NOTE — Progress Notes (Signed)
Hematology and Oncology Follow Up Visit  Gina [name] [id number] 07-18-1931 80 y.o. 06/13/2013 9:35 AM    Principle Diagnosis: 80 year old woman T3 N0 (0 of 19 lymph nodes) poorly-differentiated adenocarcinoma of the ascending colon. Diagnosed in 01/2011.   Prior Therapy: status post laparoscopic-assisted right hemicolectomy on 01/21/2011   Current therapy: Observation and follow up.   Interim History:  Gina [name] presents today for a follow up visit. She is a pleasant women with the above diagnosis. She has been doing very well without complaints since her last visit. She reports no new GI symptoms. She still reports occasional diarrhea when she overeats but for the most part she is adjusting to her new normal. Her weight remains stable. Has noted no change in bowel function. She reports loose bowel habits at times. Denies rectal bleeding. She reports loose bowel movements and takes imodium with good success. She still able to travel and spend 6 weeks in [place] where she had an episode of bronchitis but fully recovered at this time. She has not reported any headaches or blurry vision or double vision. She has not reported any chest pain or shortness of breath. Has not reported any frequency urgency or hesitancy. Has not reported any musca skeletal complaints.  Medications: I have reviewed the patient's current medications. Current Outpatient Prescriptions  Medication Sig Dispense Refill  . acetaminophen (TYLENOL) 500 MG tablet Take 500 mg by mouth every 6 (six) hours as needed. For pain      . allopurinol (ZYLOPRIM) 100 MG tablet Take 1 tablet (100 mg total) by mouth daily.  90 tablet  3  . amLODipine (NORVASC) 5 MG tablet Take 1 tablet (5 mg total) by mouth daily.  90 tablet  3  . Ascorbic Acid (VITAMIN C) 100 MG tablet Take 500 mg by mouth daily.       [name] atorvastatin (LIPITOR) 40 MG tablet Take 1 tablet (40 mg total) by mouth daily.  90 tablet  3  . Calcium Carbonate (CALCIUM 600  PO) Take 600 mg by mouth 2 (two) times daily.       [name] Clocortolone Pivalate (CLODERM) 0.1 % cream Apply 1 application topically daily.      . clopidogrel (PLAVIX) 75 MG tablet Take 1 tablet (75 mg total) by mouth daily.  90 tablet  3  . enalapril (VASOTEC) 10 MG tablet Take 1 tablet (10 mg total) by mouth daily.  90 tablet  3  . hydrochlorothiazide (MICROZIDE) 12.5 MG capsule Take 1 capsule (12.5 mg total) by mouth daily.  90 capsule  3  . Loratadine 10 MG CAPS Take 10 mg by mouth daily.       . MULTIPLE VITAMIN PO Take 1 tablet by mouth daily.       . Multiple Vitamins-Minerals (OCUVITE PRESERVISION) TABS Take 1 tablet by mouth 2 (two) times daily.        No current facility-administered medications for this visit.    Allergies:  Allergies  Allergen Reactions  . Aspirin     REACTION: Hives  . Penicillins     REACTION: Hives    Past Medical History, Surgical history, Social history, and Family History were reviewed and updated.  Review of Systems: Remaining ROS negative. Physical Exam: Blood pressure 148/68, pulse 78, temperature 98.7 F (37.1 C), temperature source Oral, resp. rate 18, height 5\' 2"  (1.575 m), weight 183 lb 6.4 oz (83.19 kg). ECOG: 1 General appearance: alert awake not in any distress. Head: Normocephalic, without obvious abnormality, atraumatic  Neck: no adenopathy, no carotid bruit, no JVD, supple, symmetrical, trachea midline and thyroid not enlarged, symmetric, no tenderness/mass/nodules Lymph nodes: Cervical, supraclavicular, and axillary nodes normal. Heart:regular rate and rhythm, S1, S2 normal, no murmur, click, rub or gallop Lung:chest clear, no wheezing, rales, normal symmetric air entry Abdomin: soft, non-tender, without masses or organomegaly EXT:no erythema, induration, or nodules   Lab Results: Lab Results  Component Value Date   WBC 5.0 06/11/2013   HGB 13.8 06/11/2013   HCT 42.5 06/11/2013   MCV 99.3 06/11/2013   PLT 236 06/11/2013      Chemistry      Component Value Date/Time   NA 142 06/11/2013 0911   NA 142 09/26/2011 1016   NA 143 05/30/2011 1150   K 4.0 06/11/2013 0911   K 4.4 09/26/2011 1016   K 3.8 05/30/2011 1150   CL 101 06/20/2012 0922   CL 102 09/26/2011 1016   CL 97* 05/30/2011 1150   CO2 27 06/11/2013 0911   CO2 32 09/26/2011 1016   CO2 30 05/30/2011 1150   BUN 18.9 06/11/2013 0911   BUN 21 09/26/2011 1016   BUN 18 05/30/2011 1150   CREATININE 0.9 06/11/2013 0911   CREATININE 0.84 09/26/2011 1016   CREATININE 0.8 05/30/2011 1150      Component Value Date/Time   CALCIUM 10.1 06/11/2013 0911   CALCIUM 9.8 09/26/2011 1016   CALCIUM 9.6 05/30/2011 1150   ALKPHOS 75 06/11/2013 0911   ALKPHOS 76 09/26/2011 1016   ALKPHOS 78 05/30/2011 1150   AST 26 06/11/2013 0911   AST 21 09/26/2011 1016   AST 28 05/30/2011 1150   ALT 20 06/11/2013 0911   ALT 15 09/26/2011 1016   ALT 26 05/30/2011 1150   BILITOT 0.91 06/11/2013 0911   BILITOT 0.6 09/26/2011 1016   BILITOT 0.90 05/30/2011 1150     Results for Gina [name] (MRN [id number]) as of 06/13/2013 09:35  Ref. Range 06/11/2013 09:11  CEA Latest Range: 0.0-5.0 ng/mL 1.7    EXAM:  CT CHEST, ABDOMEN, AND PELVIS WITH CONTRAST  TECHNIQUE:  Multidetector CT imaging of the chest, abdomen and pelvis was  performed following the standard protocol during bolus  administration of intravenous contrast.  CONTRAST: 149mL OMNIPAQUE IOHEXOL 300 MG/ML SOLN  COMPARISON: 10/23/2012  FINDINGS:  CT CHEST FINDINGS  Heart is normal size. Aorta is normal caliber. Atherosclerotic  disease diffusely throughout the coronary arteries and aorta. Small  scattered mediastinal and bilateral hilar lymph nodes, none  pathologically enlarged. No mediastinal, hilar, or axillary  adenopathy.  Tiny subpleural nodule noted posteriorly in the right lower lobe on  image 29 is stable. Subpleural nodule in the left lower lobe on  image 39 measures 8 mm, stable. No new pulmonary nodules. No pleural  or  pericardial effusion.  No acute bony abnormality or focal bone lesion.  CT ABDOMEN AND PELVIS FINDINGS  Postoperative changes from prior right hemicolectomy. No recurrent  mass at the operative site. Liver, gallbladder, spleen, pancreas,  adrenal car and is are unremarkable. Small cysts in the kidneys  bilaterally are stable. No hydronephrosis.  Sigmoid diverticulosis. No active diverticulitis. Stomach, large and  small bowel otherwise unremarkable. Uterus, adnexae and urinary  bladder unremarkable. No free fluid, free air or adenopathy.  Aorta is heavily calcified, non aneurysmal.  Degenerative changes in the lumbar spine. No acute bony abnormality  or focal bone lesion.  IMPRESSION:  No evidence of metastatic disease in the chest, abdomen or pelvis.  Postoperative changes  from right hemicolectomy.  Small bilateral lower lobe subpleural nodules are stable.   Impression and Plan:  Gina. [name] is a 80 year old woman with:  1. T3 N0 poorly-differentiated adenocarcinoma of the ascending colon. She is status post laparoscopic-assisted right hemicolectomy on 01/21/2011. Her CT scan and labs from 06/11/2013 was discussed today and showed no evidence to suggest cancer relapse.  I will continue active follow up and repeat CT scan in April 2016 and annually after that. She will need followup every 6 months for a clinical visit and laboratory testing.  2. Colonoscopy screen: She is up to date with her last one on 12/2011. Next one will be in 12/2013.  3. Loose bowel movements: She takes imodium as needed.      Wyatt Portela, MD 4/30/20159:35 AM

## 2013-08-06 ENCOUNTER — Other Ambulatory Visit: Payer: Self-pay | Admitting: *Deleted

## 2013-08-06 MED ORDER — AMLODIPINE BESYLATE 5 MG PO TABS: 5.0000 mg | ORAL_TABLET | Freq: Every day | ORAL | Status: DC

## 2013-08-06 MED ORDER — HYDROCHLOROTHIAZIDE 12.5 MG PO CAPS: 12.5000 mg | ORAL_CAPSULE | Freq: Every day | ORAL | Status: DC

## 2013-08-06 NOTE — Telephone Encounter (Signed)
Left msg on triage stating she was a pt of Dr. Linda Hedges have appt to see Dr. Alain Marion in July but she will be out of meds by then. Called pt back verfied which med she is needing. HCTZ & Norvasc inform her will send 30 day into [facility] until she see Dr. Alain Marion...[name]

## 2013-09-06 ENCOUNTER — Encounter: Payer: Self-pay | Admitting: Internal Medicine

## 2013-09-06 ENCOUNTER — Ambulatory Visit (INDEPENDENT_AMBULATORY_CARE_PROVIDER_SITE_OTHER): Payer: Medicare Other | Admitting: Internal Medicine

## 2013-09-06 ENCOUNTER — Other Ambulatory Visit (INDEPENDENT_AMBULATORY_CARE_PROVIDER_SITE_OTHER): Payer: Medicare Other

## 2013-09-06 VITALS — BP 130/70 | HR 72 | Temp 98.7°F | Resp 16 | Ht 62.0 in | Wt 185.0 lb

## 2013-09-06 DIAGNOSIS — I10 Essential (primary) hypertension: Secondary | ICD-10-CM

## 2013-09-06 DIAGNOSIS — C189 Malignant neoplasm of colon, unspecified: Secondary | ICD-10-CM

## 2013-09-06 DIAGNOSIS — M5440 Lumbago with sciatica, unspecified side: Secondary | ICD-10-CM

## 2013-09-06 DIAGNOSIS — R209 Unspecified disturbances of skin sensation: Secondary | ICD-10-CM

## 2013-09-06 DIAGNOSIS — R202 Paresthesia of skin: Secondary | ICD-10-CM

## 2013-09-06 DIAGNOSIS — G459 Transient cerebral ischemic attack, unspecified: Secondary | ICD-10-CM

## 2013-09-06 DIAGNOSIS — M109 Gout, unspecified: Secondary | ICD-10-CM

## 2013-09-06 DIAGNOSIS — M5430 Sciatica, unspecified side: Secondary | ICD-10-CM

## 2013-09-06 DIAGNOSIS — E785 Hyperlipidemia, unspecified: Secondary | ICD-10-CM

## 2013-09-06 DIAGNOSIS — C182 Malignant neoplasm of ascending colon: Secondary | ICD-10-CM

## 2013-09-06 DIAGNOSIS — Z23 Encounter for immunization: Secondary | ICD-10-CM | POA: Diagnosis not present

## 2013-09-06 DIAGNOSIS — Z0000 Encounter for general adult medical examination without abnormal findings: Secondary | ICD-10-CM | POA: Diagnosis not present

## 2013-09-06 LAB — URINALYSIS, ROUTINE W REFLEX MICROSCOPIC
Bilirubin Urine: NEGATIVE
Hgb urine dipstick: NEGATIVE
Ketones, ur: NEGATIVE
Nitrite: NEGATIVE
Specific Gravity, Urine: <=1.005 — AB (ref 1.000–1.030)
Total Protein, Urine: NEGATIVE
Urine Glucose: NEGATIVE
Urobilinogen, UA: 0.2 (ref 0.0–1.0)
pH: 7 (ref 5.0–8.0)

## 2013-09-06 LAB — CBC WITH DIFFERENTIAL/PLATELET
Basophils Absolute: 0 10*3/uL (ref 0.0–0.1)
Basophils Relative: 0.4 % (ref 0.0–3.0)
Eosinophils Absolute: 0.2 10*3/uL (ref 0.0–0.7)
Eosinophils Relative: 2.6 % (ref 0.0–5.0)
HCT: 41.1 % (ref 36.0–46.0)
Hemoglobin: 14 g/dL (ref 12.0–15.0)
Lymphocytes Relative: 23.6 % (ref 12.0–46.0)
Lymphs Abs: 1.4 10*3/uL (ref 0.7–4.0)
MCHC: 34.1 g/dL (ref 30.0–36.0)
MCV: 98.3 fl (ref 78.0–100.0)
Monocytes Absolute: 0.6 10*3/uL (ref 0.1–1.0)
Monocytes Relative: 10.5 % (ref 3.0–12.0)
Neutro Abs: 3.6 10*3/uL (ref 1.4–7.7)
Neutrophils Relative %: 62.9 % (ref 43.0–77.0)
Platelets: 208 10*3/uL (ref 150.0–400.0)
RBC: 4.18 Mil/uL (ref 3.87–5.11)
RDW: 14 % (ref 11.5–15.5)
WBC: 5.8 10*3/uL (ref 4.0–10.5)

## 2013-09-06 LAB — BASIC METABOLIC PANEL
BUN: 23 mg/dL (ref 6–23)
CO2: 32 mEq/L (ref 19–32)
Calcium: 9.8 mg/dL (ref 8.4–10.5)
Chloride: 99 mEq/L (ref 96–112)
Creatinine, Ser: 0.9 mg/dL (ref 0.4–1.2)
GFR: 68.36 mL/min (ref >60.00)
Glucose, Bld: 96 mg/dL (ref 70–99)
Potassium: 4.1 mEq/L (ref 3.5–5.1)
Sodium: 139 mEq/L (ref 135–145)

## 2013-09-06 LAB — HEPATIC FUNCTION PANEL
ALT: 22 U/L (ref 0–35)
AST: 24 U/L (ref 0–37)
Albumin: 4 g/dL (ref 3.5–5.2)
Alkaline Phosphatase: 74 U/L (ref 39–117)
Bilirubin, Direct: 0.1 mg/dL (ref 0.0–0.3)
Total Bilirubin: 0.8 mg/dL (ref 0.2–1.2)
Total Protein: 7.1 g/dL (ref 6.0–8.3)

## 2013-09-06 LAB — TSH: TSH: 2.05 u[IU]/mL (ref 0.35–4.50)

## 2013-09-06 LAB — VITAMIN B12: Vitamin B-12: 310 pg/mL (ref 211–911)

## 2013-09-06 MED ORDER — ATORVASTATIN CALCIUM 40 MG PO TABS: 40.0000 mg | ORAL_TABLET | Freq: Every day | ORAL | Status: DC

## 2013-09-06 MED ORDER — ALLOPURINOL 100 MG PO TABS: 100.0000 mg | ORAL_TABLET | Freq: Every day | ORAL | Status: DC

## 2013-09-06 MED ORDER — HYDROCHLOROTHIAZIDE 12.5 MG PO CAPS: 12.5000 mg | ORAL_CAPSULE | Freq: Every day | ORAL | Status: DC

## 2013-09-06 MED ORDER — AMLODIPINE BESYLATE 5 MG PO TABS: 5.0000 mg | ORAL_TABLET | Freq: Every day | ORAL | Status: DC

## 2013-09-06 MED ORDER — ENALAPRIL MALEATE 10 MG PO TABS: 10.0000 mg | ORAL_TABLET | Freq: Every day | ORAL | Status: DC

## 2013-09-06 MED ORDER — CLOPIDOGREL BISULFATE 75 MG PO TABS: 75.0000 mg | ORAL_TABLET | Freq: Every day | ORAL | Status: DC

## 2013-09-06 NOTE — Assessment & Plan Note (Signed)
1. Interval postsurgical changes on the left at L5-S1. No  recurrent synovial cyst or S1 nerve root encroachment identified.  2. Stable degenerative anterolisthesis and annular disc bulging at  L4-L5 contributing to mild central, right foraminal and lateral  recess stenosis bilaterally.  3. Stable mild biforaminal stenosis at L3-L4.  4. No acute findings identified.

## 2013-09-06 NOTE — Progress Notes (Signed)
Subjective:    HPI  New pt - former Dr Linda Hedges pt  The patient is here for a wellness exam.   The patient presents for a follow-up of  chronic hypertension, chronic dyslipidemia, LBP controlled with medicines  Wt Readings from Last 3 Encounters:  09/06/13 185 lb (83.915 kg)  06/13/13 183 lb 6.4 oz (83.19 kg)  02/20/13 186 lb 9.6 oz (84.641 kg)   BP Readings from Last 3 Encounters:  09/06/13 130/70  06/13/13 148/68  02/20/13 141/69      Review of Systems  Constitutional: Positive for fatigue. Negative for fever, chills, diaphoresis, activity change, appetite change and unexpected weight change.  HENT: Negative for congestion, dental problem, ear pain, hearing loss, mouth sores, postnasal drip, sinus pressure, sneezing, sore throat and voice change.   Eyes: Positive for visual disturbance. Negative for pain.  Respiratory: Negative for cough, chest tightness, wheezing and stridor.   Cardiovascular: Negative for chest pain, palpitations and leg swelling.  Gastrointestinal: Negative for nausea, vomiting, abdominal pain, blood in stool, abdominal distention and rectal pain.  Genitourinary: Negative for dysuria, frequency, hematuria, decreased urine volume, vaginal bleeding, vaginal discharge, difficulty urinating, vaginal pain and menstrual problem.  Musculoskeletal: Positive for arthralgias and back pain. Negative for gait problem, joint swelling and neck pain.  Skin: Negative for color change, rash and wound.  Neurological: Negative for dizziness, tremors, syncope, speech difficulty, weakness and light-headedness.  Hematological: Negative for adenopathy.  Psychiatric/Behavioral: Negative for suicidal ideas, hallucinations, behavioral problems, confusion, sleep disturbance, dysphoric mood and decreased concentration. The patient is not nervous/anxious and is not hyperactive.        Objective:   Physical Exam  Constitutional: She appears well-developed. No distress.  Obese     HENT:  Head: Normocephalic.  Right Ear: External ear normal.  Left Ear: External ear normal.  Nose: Nose normal.  Mouth/Throat: Oropharynx is clear and moist.  Eyes: Conjunctivae are normal. Pupils are equal, round, and reactive to light. Right eye exhibits no discharge. Left eye exhibits no discharge.  Neck: Normal range of motion. Neck supple. No JVD present. No tracheal deviation present. No thyromegaly present.  Cardiovascular: Normal rate, regular rhythm and normal heart sounds.   Pulmonary/Chest: No stridor. No respiratory distress. She has no wheezes.  Abdominal: Soft. Bowel sounds are normal. She exhibits no distension and no mass. There is no tenderness. There is no rebound and no guarding.  Musculoskeletal: She exhibits no edema and no tenderness.  Lymphadenopathy:    She has no cervical adenopathy.  Neurological: She displays normal reflexes. No cranial nerve deficit. She exhibits normal muscle tone. Coordination normal.  Skin: No rash noted. No erythema.  Psychiatric: She has a normal mood and affect. Her behavior is normal. Judgment and thought content normal.    Lab Results  Component Value Date   WBC 5.0 06/11/2013   HGB 13.8 06/11/2013   HCT 42.5 06/11/2013   PLT 236 06/11/2013   GLUCOSE 111 06/11/2013   CHOL 160 09/04/2012   TRIG 202.0* 09/04/2012   HDL 58.30 09/04/2012   LDLDIRECT 73.3 09/04/2012   LDLCALC 67 12/03/2009   ALT 20 06/11/2013   AST 26 06/11/2013   NA 142 06/11/2013   K 4.0 06/11/2013   CL 101 06/20/2012   CREATININE 0.9 06/11/2013   BUN 18.9 06/11/2013   CO2 27 06/11/2013   TSH 2.27 09/04/2012   INR 0.94 06/07/2010   HGBA1C  Value: 5.7 (NOTE)  According to the ADA Clinical Practice Recommendations for 2011, when HbA1c is used as a screening test:   >=6.5%   Diagnostic of Diabetes Mellitus           (if abnormal result  is confirmed)  5.7-6.4%   Increased risk of developing Diabetes Mellitus   References:Diagnosis and Classification of Diabetes Mellitus,Diabetes [id number]([facility] 1):S62-S69 and Standards of Medical Care in         Diabetes - 2011,Diabetes [id number]  (Suppl 1):S11-S61.* 11/12/2009          Assessment & Plan:

## 2013-09-06 NOTE — Assessment & Plan Note (Signed)
Continue with current prescription therapy as reflected on the Med list.  

## 2013-09-06 NOTE — Progress Notes (Signed)
Pre visit review using our clinic review tool, if applicable. No additional management support is needed unless otherwise documented below in the visit note. 

## 2013-09-06 NOTE — Patient Instructions (Addendum)
Chair exercisesPreventive Care for Adults A healthy lifestyle and preventive care can promote health and wellness. Preventive health guidelines for women include the following key practices.  A routine yearly physical is a good way to check with your health care provider about your health and preventive screening. It is a chance to share any concerns and updates on your health and to receive a thorough exam.  Visit your dentist for a routine exam and preventive care every 6 months. Brush your teeth twice a day and floss once a day. Good oral hygiene prevents tooth decay and gum disease.  The frequency of eye exams is based on your age, health, family medical history, use of contact lenses, and other factors. Follow your health care provider's recommendations for frequency of eye exams.  Eat a healthy diet. Foods like vegetables, fruits, whole grains, low-fat dairy products, and lean protein foods contain the nutrients you need without too many calories. Decrease your intake of foods high in solid fats, added sugars, and salt. Eat the right amount of calories for you.Get information about a proper diet from your health care provider, if necessary.  Regular physical exercise is one of the most important things you can do for your health. Most adults should get at least 150 minutes of moderate-intensity exercise (any activity that increases your heart rate and causes you to sweat) each week. In addition, most adults need muscle-strengthening exercises on 2 or more days a week.  Maintain a healthy weight. The body mass index (BMI) is a screening tool to identify possible weight problems. It provides an estimate of body fat based on height and weight. Your health care provider can find your BMI and can help you achieve or maintain a healthy weight.For adults 20 years and older:  A BMI below 18.5 is considered underweight.  A BMI of 18.5 to 24.9 is normal.  A BMI of 25 to 29.9 is considered  overweight.  A BMI of 30 and above is considered obese.  Maintain normal blood lipids and cholesterol levels by exercising and minimizing your intake of saturated fat. Eat a balanced diet with plenty of fruit and vegetables. Blood tests for lipids and cholesterol should begin at age 27 and be repeated every 5 years. If your lipid or cholesterol levels are high, you are over 50, or you are at high risk for heart disease, you may need your cholesterol levels checked more frequently.Ongoing high lipid and cholesterol levels should be treated with medicines if diet and exercise are not working.  If you smoke, find out from your health care provider how to quit. If you do not use tobacco, do not start.  Lung cancer screening is recommended for adults aged 10-80 years who are at high risk for developing lung cancer because of a history of smoking. A yearly low-dose CT scan of the lungs is recommended for people who have at least a 30-pack-year history of smoking and are a current smoker or have quit within the past 15 years. A pack year of smoking is smoking an average of 1 pack of cigarettes a day for 1 year (for example: 1 pack a day for 30 years or 2 packs a day for 15 years). Yearly screening should continue until the smoker has stopped smoking for at least 15 years. Yearly screening should be stopped for people who develop a health problem that would prevent them from having lung cancer treatment.  If you are pregnant, do not drink alcohol. If you are  breastfeeding, be very cautious about drinking alcohol. If you are not pregnant and choose to drink alcohol, do not have more than 1 drink per day. One drink is considered to be 12 ounces (355 mL) of beer, 5 ounces (148 mL) of wine, or 1.5 ounces (44 mL) of liquor.  Avoid use of street drugs. Do not share needles with anyone. Ask for help if you need support or instructions about stopping the use of drugs.  High blood pressure causes heart disease and  increases the risk of stroke. Your blood pressure should be checked at least every 1 to 2 years. Ongoing high blood pressure should be treated with medicines if weight loss and exercise do not work.  If you are 63-11 years old, ask your health care provider if you should take aspirin to prevent strokes.  Diabetes screening involves taking a blood sample to check your fasting blood sugar level. This should be done once every 3 years, after age 29, if you are within normal weight and without risk factors for diabetes. Testing should be considered at a younger age or be carried out more frequently if you are overweight and have at least 1 risk factor for diabetes.  Breast cancer screening is essential preventive care for women. You should practice "breast self-awareness." This means understanding the normal appearance and feel of your breasts and may include breast self-examination. Any changes detected, no matter how small, should be reported to a health care provider. Women in their 39s and 30s should have a clinical breast exam (CBE) by a health care provider as part of a regular health exam every 1 to 3 years. After age 6, women should have a CBE every year. Starting at age 27, women should consider having a mammogram (breast X-ray test) every year. Women who have a family history of breast cancer should talk to their health care provider about genetic screening. Women at a high risk of breast cancer should talk to their health care providers about having an MRI and a mammogram every year.  Breast cancer gene (BRCA)-related cancer risk assessment is recommended for women who have family members with BRCA-related cancers. BRCA-related cancers include breast, ovarian, tubal, and peritoneal cancers. Having family members with these cancers may be associated with an increased risk for harmful changes (mutations) in the breast cancer genes BRCA1 and BRCA2. Results of the assessment will determine the need for  genetic counseling and BRCA1 and BRCA2 testing.  Routine pelvic exams to screen for cancer are no longer recommended for nonpregnant women who are considered low risk for cancer of the pelvic organs (ovaries, uterus, and vagina) and who do not have symptoms. Ask your health care provider if a screening pelvic exam is right for you.  If you have had past treatment for cervical cancer or a condition that could lead to cancer, you need Pap tests and screening for cancer for at least 20 years after your treatment. If Pap tests have been discontinued, your risk factors (such as having a new sexual partner) need to be reassessed to determine if screening should be resumed. Some women have medical problems that increase the chance of getting cervical cancer. In these cases, your health care provider may recommend more frequent screening and Pap tests.  The HPV test is an additional test that may be used for cervical cancer screening. The HPV test looks for the virus that can cause the cell changes on the cervix. The cells collected during the Pap test can  be tested for HPV. The HPV test could be used to screen women aged 80 years and older, and should be used in women of any age who have unclear Pap test results. After the age of 66, women should have HPV testing at the same frequency as a Pap test.  Colorectal cancer can be detected and often prevented. Most routine colorectal cancer screening begins at the age of 19 years and continues through age 76 years. However, your health care provider may recommend screening at an earlier age if you have risk factors for colon cancer. On a yearly basis, your health care provider may provide home test kits to check for hidden blood in the stool. Use of a small camera at the end of a tube, to directly examine the colon (sigmoidoscopy or colonoscopy), can detect the earliest forms of colorectal cancer. Talk to your health care provider about this at age 84, when routine  screening begins. Direct exam of the colon should be repeated every 5-10 years through age 63 years, unless early forms of pre-cancerous polyps or small growths are found.  People who are at an increased risk for hepatitis B should be screened for this virus. You are considered at high risk for hepatitis B if:  You were born in a country where hepatitis B occurs often. Talk with your health care provider about which countries are considered high risk.  Your parents were born in a high-risk country and you have not received a shot to protect against hepatitis B (hepatitis B vaccine).  You have HIV or AIDS.  You use needles to inject street drugs.  You live with, or have sex with, someone who has hepatitis B.  You get hemodialysis treatment.  You take certain medicines for conditions like cancer, organ transplantation, and autoimmune conditions.  Hepatitis C blood testing is recommended for all people born from [birth year] through 1965 and any individual with known risks for hepatitis C.  Practice safe sex. Use condoms and avoid high-risk sexual practices to reduce the spread of sexually transmitted infections (STIs). STIs include gonorrhea, chlamydia, syphilis, trichomonas, herpes, HPV, and human immunodeficiency virus (HIV). Herpes, HIV, and HPV are viral illnesses that have no cure. They can result in disability, cancer, and death.  You should be screened for sexually transmitted illnesses (STIs) including gonorrhea and chlamydia if:  You are sexually active and are younger than 24 years.  You are older than 24 years and your health care provider tells you that you are at risk for this type of infection.  Your sexual activity has changed since you were last screened and you are at an increased risk for chlamydia or gonorrhea. Ask your health care provider if you are at risk.  If you are at risk of being infected with HIV, it is recommended that you take a prescription medicine daily to  prevent HIV infection. This is called preexposure prophylaxis (PrEP). You are considered at risk if:  You are a heterosexual woman, are sexually active, and are at increased risk for HIV infection.  You take drugs by injection.  You are sexually active with a partner who has HIV.  Talk with your health care provider about whether you are at high risk of being infected with HIV. If you choose to begin PrEP, you should first be tested for HIV. You should then be tested every 3 months for as long as you are taking PrEP.  Osteoporosis is a disease in which the bones lose minerals and  strength with aging. This can result in serious bone fractures or breaks. The risk of osteoporosis can be identified using a bone density scan. Women ages 18 years and over and women at risk for fractures or osteoporosis should discuss screening with their health care providers. Ask your health care provider whether you should take a calcium supplement or vitamin D to reduce the rate of osteoporosis.  Menopause can be associated with physical symptoms and risks. Hormone replacement therapy is available to decrease symptoms and risks. You should talk to your health care provider about whether hormone replacement therapy is right for you.  Use sunscreen. Apply sunscreen liberally and repeatedly throughout the day. You should seek shade when your shadow is shorter than you. Protect yourself by wearing long sleeves, pants, a wide-brimmed hat, and sunglasses year round, whenever you are outdoors.  Once a month, do a whole body skin exam, using a mirror to look at the skin on your back. Tell your health care provider of new moles, moles that have irregular borders, moles that are larger than a pencil eraser, or moles that have changed in shape or color.  Stay current with required vaccines (immunizations).  Influenza vaccine. All adults should be immunized every year.  Tetanus, diphtheria, and acellular pertussis (Td, Tdap)  vaccine. Pregnant women should receive 1 dose of Tdap vaccine during each pregnancy. The dose should be obtained regardless of the length of time since the last dose. Immunization is preferred during the 27th-36th week of gestation. An adult who has not previously received Tdap or who does not know her vaccine status should receive 1 dose of Tdap. This initial dose should be followed by tetanus and diphtheria toxoids (Td) booster doses every 10 years. Adults with an unknown or incomplete history of completing a 3-dose immunization series with Td-containing vaccines should begin or complete a primary immunization series including a Tdap dose. Adults should receive a Td booster every 10 years.  Varicella vaccine. An adult without evidence of immunity to varicella should receive 2 doses or a second dose if she has previously received 1 dose. Pregnant females who do not have evidence of immunity should receive the first dose after pregnancy. This first dose should be obtained before leaving the health care facility. The second dose should be obtained 4-8 weeks after the first dose.  Human papillomavirus (HPV) vaccine. Females aged 13-26 years who have not received the vaccine previously should obtain the 3-dose series. The vaccine is not recommended for use in pregnant females. However, pregnancy testing is not needed before receiving a dose. If a female is found to be pregnant after receiving a dose, no treatment is needed. In that case, the remaining doses should be delayed until after the pregnancy. Immunization is recommended for any person with an immunocompromised condition through the age of 27 years if she did not get any or all doses earlier. During the 3-dose series, the second dose should be obtained 4-8 weeks after the first dose. The third dose should be obtained 24 weeks after the first dose and 16 weeks after the second dose.  Zoster vaccine. One dose is recommended for adults aged 39 years or older  unless certain conditions are present.  Measles, mumps, and rubella (MMR) vaccine. Adults born before [birth year] generally are considered immune to measles and mumps. Adults born in [birth year] or later should have 1 or more doses of MMR vaccine unless there is a contraindication to the vaccine or there is laboratory evidence of immunity  to each of the three diseases. A routine second dose of MMR vaccine should be obtained at least 28 days after the first dose for students attending postsecondary schools, health care workers, or international travelers. People who received inactivated measles vaccine or an unknown type of measles vaccine during 1963-1967 should receive 2 doses of MMR vaccine. People who received inactivated mumps vaccine or an unknown type of mumps vaccine before 1979 and are at high risk for mumps infection should consider immunization with 2 doses of MMR vaccine. For females of childbearing age, rubella immunity should be determined. If there is no evidence of immunity, females who are not pregnant should be vaccinated. If there is no evidence of immunity, females who are pregnant should delay immunization until after pregnancy. Unvaccinated health care workers born before [birth year] who lack laboratory evidence of measles, mumps, or rubella immunity or laboratory confirmation of disease should consider measles and mumps immunization with 2 doses of MMR vaccine or rubella immunization with 1 dose of MMR vaccine.  Pneumococcal 13-valent conjugate (PCV13) vaccine. When indicated, a person who is uncertain of her immunization history and has no record of immunization should receive the PCV13 vaccine. An adult aged 67 years or older who has certain medical conditions and has not been previously immunized should receive 1 dose of PCV13 vaccine. This PCV13 should be followed with a dose of pneumococcal polysaccharide (PPSV23) vaccine. The PPSV23 vaccine dose should be obtained at least 8 weeks after the dose of PCV13  vaccine. An adult aged 73 years or older who has certain medical conditions and previously received 1 or more doses of PPSV23 vaccine should receive 1 dose of PCV13. The PCV13 vaccine dose should be obtained 1 or more years after the last PPSV23 vaccine dose.  Pneumococcal polysaccharide (PPSV23) vaccine. When PCV13 is also indicated, PCV13 should be obtained first. All adults aged 21 years and older should be immunized. An adult younger than age 62 years who has certain medical conditions should be immunized. Any person who resides in a nursing home or long-term care facility should be immunized. An adult smoker should be immunized. People with an immunocompromised condition and certain other conditions should receive both PCV13 and PPSV23 vaccines. People with human immunodeficiency virus (HIV) infection should be immunized as soon as possible after diagnosis. Immunization during chemotherapy or radiation therapy should be avoided. Routine use of PPSV23 vaccine is not recommended for American Indians, [place] Natives, or people younger than 65 years unless there are medical conditions that require PPSV23 vaccine. When indicated, people who have unknown immunization and have no record of immunization should receive PPSV23 vaccine. One-time revaccination 5 years after the first dose of PPSV23 is recommended for people aged 19-64 years who have chronic kidney failure, nephrotic syndrome, asplenia, or immunocompromised conditions. People who received 1-2 doses of PPSV23 before age 54 years should receive another dose of PPSV23 vaccine at age 80 years or later if at least 5 years have passed since the previous dose. Doses of PPSV23 are not needed for people immunized with PPSV23 at or after age 21 years.  Meningococcal vaccine. Adults with asplenia or persistent complement component deficiencies should receive 2 doses of quadrivalent meningococcal conjugate (MenACWY-D) vaccine. The doses should be obtained at least  2 months apart. Microbiologists working with certain meningococcal bacteria, [place] recruits, people at risk during an outbreak, and people who travel to or live in countries with a high rate of meningitis should be immunized. A first-year college student up through  age 38 years who is living in a residence hall should receive a dose if she did not receive a dose on or after her 16th birthday. Adults who have certain high-risk conditions should receive one or more doses of vaccine.  Hepatitis A vaccine. Adults who wish to be protected from this disease, have certain high-risk conditions, work with hepatitis A-infected animals, work in hepatitis A research labs, or travel to or work in countries with a high rate of hepatitis A should be immunized. Adults who were previously unvaccinated and who anticipate close contact with an international adoptee during the first 60 days after arrival in the [place] from a country with a high rate of hepatitis A should be immunized.  Hepatitis B vaccine. Adults who wish to be protected from this disease, have certain high-risk conditions, may be exposed to blood or other infectious body fluids, are household contacts or sex partners of hepatitis B positive people, are clients or workers in certain care facilities, or travel to or work in countries with a high rate of hepatitis B should be immunized.  Haemophilus influenzae type b (Hib) vaccine. A previously unvaccinated person with asplenia or sickle cell disease or having a scheduled splenectomy should receive 1 dose of Hib vaccine. Regardless of previous immunization, a recipient of a hematopoietic stem cell transplant should receive a 3-dose series 6-12 months after her successful transplant. Hib vaccine is not recommended for adults with HIV infection. Preventive Services / Frequency Ages 51 to 78 years  Blood pressure check.** / Every 1 to 2 years.  Lipid and cholesterol check.** / Every 5 years beginning  at age 109.  Clinical breast exam.** / Every 3 years for women in their 35s and 15s.  BRCA-related cancer risk assessment.** / For women who have family members with a BRCA-related cancer (breast, ovarian, tubal, or peritoneal cancers).  Pap test.** / Every 2 years from ages 73 through 7. Every 3 years starting at age 28 through age 22 or 61 with a history of 3 consecutive normal Pap tests.  HPV screening.** / Every 3 years from ages 52 through ages 59 to 42 with a history of 3 consecutive normal Pap tests.  Hepatitis C blood test.** / For any individual with known risks for hepatitis C.  Skin self-exam. / Monthly.  Influenza vaccine. / Every year.  Tetanus, diphtheria, and acellular pertussis (Tdap, Td) vaccine.** / Consult your health care provider. Pregnant women should receive 1 dose of Tdap vaccine during each pregnancy. 1 dose of Td every 10 years.  Varicella vaccine.** / Consult your health care provider. Pregnant females who do not have evidence of immunity should receive the first dose after pregnancy.  HPV vaccine. / 3 doses over 6 months, if 43 and younger. The vaccine is not recommended for use in pregnant females. However, pregnancy testing is not needed before receiving a dose.  Measles, mumps, rubella (MMR) vaccine.** / You need at least 1 dose of MMR if you were born in 1957 or later. You may also need a 2nd dose. For females of childbearing age, rubella immunity should be determined. If there is no evidence of immunity, females who are not pregnant should be vaccinated. If there is no evidence of immunity, females who are pregnant should delay immunization until after pregnancy.  Pneumococcal 13-valent conjugate (PCV13) vaccine.** / Consult your health care provider.  Pneumococcal polysaccharide (PPSV23) vaccine.** / 1 to 2 doses if you smoke cigarettes or if you have certain conditions.  Meningococcal  vaccine.** / 1 dose if you are age 66 to 45 years and a Occupational psychologist living in a residence hall, or have one of several medical conditions, you need to get vaccinated against meningococcal disease. You may also need additional booster doses.  Hepatitis A vaccine.** / Consult your health care provider.  Hepatitis B vaccine.** / Consult your health care provider.  Haemophilus influenzae type b (Hib) vaccine.** / Consult your health care provider. Ages 51 to 70 years  Blood pressure check.** / Every 1 to 2 years.  Lipid and cholesterol check.** / Every 5 years beginning at age 70 years.  Lung cancer screening. / Every year if you are aged 39-80 years and have a 30-pack-year history of smoking and currently smoke or have quit within the past 15 years. Yearly screening is stopped once you have quit smoking for at least 15 years or develop a health problem that would prevent you from having lung cancer treatment.  Clinical breast exam.** / Every year after age 9 years.  BRCA-related cancer risk assessment.** / For women who have family members with a BRCA-related cancer (breast, ovarian, tubal, or peritoneal cancers).  Mammogram.** / Every year beginning at age 57 years and continuing for as long as you are in good health. Consult with your health care provider.  Pap test.** / Every 3 years starting at age 83 years through age 26 or 32 years with a history of 3 consecutive normal Pap tests.  HPV screening.** / Every 3 years from ages 71 years through ages 31 to 65 years with a history of 3 consecutive normal Pap tests.  Fecal occult blood test (FOBT) of stool. / Every year beginning at age 33 years and continuing until age 15 years. You may not need to do this test if you get a colonoscopy every 10 years.  Flexible sigmoidoscopy or colonoscopy.** / Every 5 years for a flexible sigmoidoscopy or every 10 years for a colonoscopy beginning at age 37 years and continuing until age 25 years.  Hepatitis C blood test.** / For all people born from [birth year]  through 1965 and any individual with known risks for hepatitis C.  Skin self-exam. / Monthly.  Influenza vaccine. / Every year.  Tetanus, diphtheria, and acellular pertussis (Tdap/Td) vaccine.** / Consult your health care provider. Pregnant women should receive 1 dose of Tdap vaccine during each pregnancy. 1 dose of Td every 10 years.  Varicella vaccine.** / Consult your health care provider. Pregnant females who do not have evidence of immunity should receive the first dose after pregnancy.  Zoster vaccine.** / 1 dose for adults aged 62 years or older.  Measles, mumps, rubella (MMR) vaccine.** / You need at least 1 dose of MMR if you were born in 1957 or later. You may also need a 2nd dose. For females of childbearing age, rubella immunity should be determined. If there is no evidence of immunity, females who are not pregnant should be vaccinated. If there is no evidence of immunity, females who are pregnant should delay immunization until after pregnancy.  Pneumococcal 13-valent conjugate (PCV13) vaccine.** / Consult your health care provider.  Pneumococcal polysaccharide (PPSV23) vaccine.** / 1 to 2 doses if you smoke cigarettes or if you have certain conditions.  Meningococcal vaccine.** / Consult your health care provider.  Hepatitis A vaccine.** / Consult your health care provider.  Hepatitis B vaccine.** / Consult your health care provider.  Haemophilus influenzae type b (Hib) vaccine.** / Consult your health care provider.  years and over  Blood pressure check.** / Every 1 to 2 years.  Lipid and cholesterol check.** / Every 5 years beginning at age 20 years.  Lung cancer screening. / Every year if you are aged 55-80 years and have a 30-pack-year history of smoking and currently smoke or have quit within the past 15 years. Yearly screening is stopped once you have quit smoking for at least 15 years or develop a health problem that would prevent you from having lung cancer  treatment.  Clinical breast exam.** / Every year after age 40 years.  BRCA-related cancer risk assessment.** / For women who have family members with a BRCA-related cancer (breast, ovarian, tubal, or peritoneal cancers).  Mammogram.** / Every year beginning at age 40 years and continuing for as long as you are in good health. Consult with your health care provider.  Pap test.** / Every 3 years starting at age 30 years through age 65 or 70 years with 3 consecutive normal Pap tests. Testing can be stopped between 65 and 70 years with 3 consecutive normal Pap tests and no abnormal Pap or HPV tests in the past 10 years.  HPV screening.** / Every 3 years from ages 30 years through ages 65 or 70 years with a history of 3 consecutive normal Pap tests. Testing can be stopped between 65 and 70 years with 3 consecutive normal Pap tests and no abnormal Pap or HPV tests in the past 10 years.  Fecal occult blood test (FOBT) of stool. / Every year beginning at age 50 years and continuing until age 75 years. You may not need to do this test if you get a colonoscopy every 10 years.  Flexible sigmoidoscopy or colonoscopy.** / Every 5 years for a flexible sigmoidoscopy or every 10 years for a colonoscopy beginning at age 50 years and continuing until age 75 years.  Hepatitis C blood test.** / For all people born from 1945 through 1965 and any individual with known risks for hepatitis C.  Osteoporosis screening.** / A one-time screening for women ages 65 years and over and women at risk for fractures or osteoporosis.  Skin self-exam. / Monthly.  Influenza vaccine. / Every year.  Tetanus, diphtheria, and acellular pertussis (Tdap/Td) vaccine.** / 1 dose of Td every 10 years.  Varicella vaccine.** / Consult your health care provider.  Zoster vaccine.** / 1 dose for adults aged 60 years or older.  Pneumococcal 13-valent conjugate (PCV13) vaccine.** / Consult your health care provider.  Pneumococcal  polysaccharide (PPSV23) vaccine.** / 1 dose for all adults aged 65 years and older.  Meningococcal vaccine.** / Consult your health care provider.  Hepatitis A vaccine.** / Consult your health care provider.  Hepatitis B vaccine.** / Consult your health care provider.  Haemophilus influenzae type b (Hib) vaccine.** / Consult your health care provider. ** Family history and personal history of risk and conditions may change your health care provider's recommendations. Document Released: 03/29/2001 Document Revised: 06/17/2013 Document Reviewed: 06/28/2010 ExitCare Patient Information 2015 [employer]. This information is not intended to replace advice given to you by your health care provider. Make sure you discuss any questions you have with your health care provider.  

## 2013-09-06 NOTE — Assessment & Plan Note (Signed)
T3No adenocarcinoma colon s/p Laprascopic-hand assisted colectomy Dec 12, '12. (Dr. Georgette Dover). Has followed up with Dr. Jamse Arn. PET & CT chest Jan 17, '13 - negative for mets CT chest & abd/pelvic April 15, '13 - no evidence of recurrent disease Dr Alen Blew

## 2013-09-07 DIAGNOSIS — Z0000 Encounter for general adult medical examination without abnormal findings: Secondary | ICD-10-CM | POA: Insufficient documentation

## 2013-09-07 NOTE — Assessment & Plan Note (Signed)
Here for medicare wellness/physical  Diet: heart healthy  Physical activity: sedentary  Depression/mood screen: negative  Hearing: intact to whispered voice  Visual acuity: grossly decreased, performs annual eye exam  ADLs: capable  Fall risk: none  Home safety: good  Cognitive evaluation: intact to orientation, naming, recall and repetition  EOL planning: adv directives, full code/ I agree  I have personally reviewed and have noted  1. The patient's medical and social history  2. Their use of alcohol, tobacco or illicit drugs  3. Their current medications and supplements  4. The patient's functional ability including ADL's, fall risks, home safety risks and hearing or visual impairment.  5. Diet and physical activities  6. Evidence for depression or mood disorders    Today patient counseled on age appropriate routine health concerns for screening and prevention, each reviewed and up to date or declined. Immunizations reviewed and up to date or declined. Labs ordered and reviewed. Risk factors for depression reviewed and negative. Hearing function and visual acuity are intact. ADLs screened and addressed as needed. Functional ability and level of safety reviewed and appropriate. Education, counseling and referrals performed based on assessed risks today. Patient provided with a copy of personalized plan for preventive services.

## 2013-10-17 ENCOUNTER — Encounter: Payer: Self-pay | Admitting: *Deleted

## 2013-11-09 DIAGNOSIS — Z23 Encounter for immunization: Secondary | ICD-10-CM | POA: Diagnosis not present

## 2013-12-13 ENCOUNTER — Other Ambulatory Visit (HOSPITAL_BASED_OUTPATIENT_CLINIC_OR_DEPARTMENT_OTHER): Payer: Medicare Other

## 2013-12-13 ENCOUNTER — Telehealth: Payer: Self-pay | Admitting: Oncology

## 2013-12-13 ENCOUNTER — Ambulatory Visit (HOSPITAL_BASED_OUTPATIENT_CLINIC_OR_DEPARTMENT_OTHER): Payer: Medicare Other | Admitting: Oncology

## 2013-12-13 VITALS — BP 146/62 | HR 83 | Temp 98.5°F | Resp 18 | Ht 62.0 in | Wt 186.0 lb

## 2013-12-13 DIAGNOSIS — Z85038 Personal history of other malignant neoplasm of large intestine: Secondary | ICD-10-CM

## 2013-12-13 DIAGNOSIS — C182 Malignant neoplasm of ascending colon: Secondary | ICD-10-CM | POA: Diagnosis not present

## 2013-12-13 LAB — CBC WITH DIFFERENTIAL/PLATELET
BASO%: 1 % (ref 0.0–2.0)
Basophils Absolute: 0 10*3/uL (ref 0.0–0.1)
EOS%: 3 % (ref 0.0–7.0)
Eosinophils Absolute: 0.1 10*3/uL (ref 0.0–0.5)
HCT: 42.9 % (ref 34.8–46.6)
HGB: 13.8 g/dL (ref 11.6–15.9)
LYMPH%: 26.3 % (ref 14.0–49.7)
MCH: 32.2 pg (ref 25.1–34.0)
MCHC: 32.2 g/dL (ref 31.5–36.0)
MCV: 99.9 fL (ref 79.5–101.0)
MONO#: 0.5 10*3/uL (ref 0.1–0.9)
MONO%: 11.1 % (ref 0.0–14.0)
NEUT#: 2.8 10*3/uL (ref 1.5–6.5)
NEUT%: 58.6 % (ref 38.4–76.8)
Platelets: 228 10*3/uL (ref 145–400)
RBC: 4.29 10*6/uL (ref 3.70–5.45)
RDW: 14 % (ref 11.2–14.5)
WBC: 4.9 10*3/uL (ref 3.9–10.3)
lymph#: 1.3 10*3/uL (ref 0.9–3.3)

## 2013-12-13 LAB — COMPREHENSIVE METABOLIC PANEL (CC13)
ALT: 20 U/L (ref 0–55)
AST: 23 U/L (ref 5–34)
Albumin: 4.1 g/dL (ref 3.5–5.0)
Alkaline Phosphatase: 76 U/L (ref 40–150)
Anion Gap: 11 mEq/L (ref 3–11)
BUN: 26 mg/dL (ref 7.0–26.0)
CO2: 27 mEq/L (ref 22–29)
Calcium: 10.1 mg/dL (ref 8.4–10.4)
Chloride: 104 mEq/L (ref 98–109)
Creatinine: 0.9 mg/dL (ref 0.6–1.1)
Glucose: 116 mg/dl (ref 70–140)
Potassium: 4 mEq/L (ref 3.5–5.1)
Sodium: 142 mEq/L (ref 136–145)
Total Bilirubin: 0.73 mg/dL (ref 0.20–1.20)
Total Protein: 7.2 g/dL (ref 6.4–8.3)

## 2013-12-13 NOTE — Progress Notes (Signed)
Hematology and Oncology Follow Up Visit  IMAGINE NEST [id number] 01-10-1932 80 y.o. 12/13/2013 10:35 AM    Principle Diagnosis: 80 year old woman T3 N0 (0 of 19 lymph nodes) poorly-differentiated adenocarcinoma of the ascending colon. Diagnosed in 01/2011.   Prior Therapy: status post laparoscopic-assisted right hemicolectomy on 01/21/2011   Current therapy: Observation and follow up.   Interim History:  Gina [name] presents today for a follow up visit. Since the last visit, she has been doing well without any new complaints. She reports no new GI symptoms. She still reports occasional diarrhea which has not dramatically changed. Her weight remains stable. She denies rectal bleeding. She continues to be active but not able to drive recently related to visual problems. She has not reported any headaches or blurry vision or double vision. She has not reported any chest pain or shortness of breath. Has not reported any frequency urgency or hesitancy. Has not reported any musca skeletal complaints. Rest of the review of system is unremarkable.  Medications: I have reviewed the patient's current medications. Current Outpatient Prescriptions  Medication Sig Dispense Refill  . acetaminophen (TYLENOL) 500 MG tablet Take 500 mg by mouth every 6 (six) hours as needed. For pain      . allopurinol (ZYLOPRIM) 100 MG tablet Take 1 tablet (100 mg total) by mouth daily.  90 tablet  3  . amLODipine (NORVASC) 5 MG tablet Take 1 tablet (5 mg total) by mouth daily.  90 tablet  3  . Ascorbic Acid (VITAMIN C) 100 MG tablet Take 500 mg by mouth daily.       [name] atorvastatin (LIPITOR) 40 MG tablet Take 1 tablet (40 mg total) by mouth daily.  90 tablet  3  . Calcium Carbonate (CALCIUM 600 PO) Take 600 mg by mouth 2 (two) times daily.       [name] Clocortolone Pivalate (CLODERM) 0.1 % cream Apply 1 application topically daily.      . clopidogrel (PLAVIX) 75 MG tablet Take 1 tablet (75 mg total) by mouth daily.  90  tablet  3  . enalapril (VASOTEC) 10 MG tablet Take 1 tablet (10 mg total) by mouth daily.  90 tablet  3  . hydrochlorothiazide (MICROZIDE) 12.5 MG capsule Take 1 capsule (12.5 mg total) by mouth daily.  90 capsule  3  . Loratadine 10 MG CAPS Take 10 mg by mouth daily.       . MULTIPLE VITAMIN PO Take 1 tablet by mouth daily.       . Multiple Vitamins-Minerals (OCUVITE PRESERVISION) TABS Take 1 tablet by mouth 2 (two) times daily.        No current facility-administered medications for this visit.    Allergies:  Allergies  Allergen Reactions  . Aspirin     REACTION: Hives  . Penicillins     REACTION: Hives    Past Medical History, Surgical history, Social history, and Family History were reviewed and updated.   Physical Exam: Blood pressure 146/62, pulse 83, temperature 98.5 F (36.9 C), temperature source Oral, resp. rate 18, height 5\' 2"  (1.575 m), weight 186 lb (84.369 kg). ECOG: 1 General appearance: alert awake not in any distress. Head: Normocephalic, without obvious abnormality Neck: no adenopathy Lymph nodes: Cervical, supraclavicular, and axillary nodes normal. Heart:regular rate and rhythm, S1, S2 normal, no murmur, click, rub or gallop Lung:chest clear, no wheezing, rales, normal symmetric air entry Abdomin: soft, non-tender, without masses or organomegaly EXT:no erythema, induration, or nodules   Lab Results: Lab Results  Component Value Date   WBC 4.9 12/13/2013   HGB 13.8 12/13/2013   HCT 42.9 12/13/2013   MCV 99.9 12/13/2013   PLT 228 12/13/2013     Chemistry      Component Value Date/Time   NA 139 09/06/2013 1047   NA 142 06/11/2013 0911   NA 143 05/30/2011 1150   K 4.1 09/06/2013 1047   K 4.0 06/11/2013 0911   K 3.8 05/30/2011 1150   CL 99 09/06/2013 1047   CL 101 06/20/2012 0922   CL 97* 05/30/2011 1150   CO2 32 09/06/2013 1047   CO2 27 06/11/2013 0911   CO2 30 05/30/2011 1150   BUN 23 09/06/2013 1047   BUN 18.9 06/11/2013 0911   BUN 18 05/30/2011 1150    CREATININE 0.9 09/06/2013 1047   CREATININE 0.9 06/11/2013 0911   CREATININE 0.8 05/30/2011 1150      Component Value Date/Time   CALCIUM 9.8 09/06/2013 1047   CALCIUM 10.1 06/11/2013 0911   CALCIUM 9.6 05/30/2011 1150   ALKPHOS 74 09/06/2013 1047   ALKPHOS 75 06/11/2013 0911   ALKPHOS 78 05/30/2011 1150   AST 24 09/06/2013 1047   AST 26 06/11/2013 0911   AST 28 05/30/2011 1150   ALT 22 09/06/2013 1047   ALT 20 06/11/2013 0911   ALT 26 05/30/2011 1150   BILITOT 0.8 09/06/2013 1047   BILITOT 0.91 06/11/2013 0911   BILITOT 0.90 05/30/2011 1150      Impression and Plan:   80 year old woman with:  1. T3 N0 poorly-differentiated adenocarcinoma of the ascending colon. She is status post laparoscopic-assisted right hemicolectomy on 01/21/2011. Her CT scan and labs from 06/11/2013  showed no evidence to suggest cancer relapse.  She is doing well without any clinical signs or symptoms of relapse. The plan is to continue with active surveillance and repeat laboratory testing and imaging studies in March 2016.  2. Colonoscopy screen: She is up to date with her last one on 12/2011. Next one will be scheduled in the near future.  3. Loose bowel movements: This appears to be under reasonable control for the time being. She takes Imodium as needed.     GOTLXB,WIOMB, MD 10/30/201510:35 AM

## 2013-12-13 NOTE — Telephone Encounter (Signed)
Pt confirmed labs/ov per 10/30 POF, gave pt AVS.... KJ

## 2013-12-14 LAB — CEA: CEA: 1.8 ng/mL (ref 0.0–5.0)

## 2013-12-20 ENCOUNTER — Telehealth: Payer: Self-pay | Admitting: *Deleted

## 2013-12-20 ENCOUNTER — Ambulatory Visit (INDEPENDENT_AMBULATORY_CARE_PROVIDER_SITE_OTHER): Payer: Medicare Other | Admitting: Internal Medicine

## 2013-12-20 ENCOUNTER — Encounter: Payer: Self-pay | Admitting: Internal Medicine

## 2013-12-20 VITALS — BP 136/60 | HR 84 | Ht 62.0 in | Wt 187.0 lb

## 2013-12-20 DIAGNOSIS — Z7901 Long term (current) use of anticoagulants: Secondary | ICD-10-CM | POA: Diagnosis not present

## 2013-12-20 DIAGNOSIS — Z85038 Personal history of other malignant neoplasm of large intestine: Secondary | ICD-10-CM | POA: Diagnosis not present

## 2013-12-20 MED ORDER — MOVIPREP 100 G PO SOLR: 1.0000 | Freq: Once | ORAL | Status: DC

## 2013-12-20 NOTE — Progress Notes (Signed)
Gina [name] [facility] [birth year] [id number]  Note: This dictation was prepared with Dragon digital system. Any transcriptional errors that result from this procedure are unintentional.   History of Present Illness:  This is an 80 year old white female who has a  history of T3N0 poorly differentiated adenocarcinoma of the ascending colon resected in December 2012 with laparoscopically-assisted right hemicolectomy. She has done extremely well. Her last hemoglobin was 13.8 and CEA level was I.5. Followed by Dr Alen Blew.. Last appointment about 1 week ago. Restaging CT scan of the abdomen in April 2015 showed no evidence of metastatic disease. She is scheduled for a repeat CT scan in March 2016. Her last colonoscopy was in December 2013. She is due  for a recall colonoscopy. She denies  diarrhea, rectal bleeding, abdominal pain or weight loss. She is very active and is planning a trip to [place] in April 2016.    Past Medical History  Diagnosis Date  . Unspecified transient cerebral ischemia sept '11  . Hyperplastic colon polyp   . Diverticulosis of colon (without mention of hemorrhage)   . Hypertension   . Hyperlipidemia   . Gout   . Allergic rhinitis   . GI bleed   . Arthritis   . Stroke     TIA  . Colon cancer Dec 12    Past Surgical History  Procedure Laterality Date  . Breast lumpectomy      right breast '86  . Tonsillectomy    . Back surgery  April '12    benign cyst spine ([place])  . Colon surgery  Dec '12    colectomy for colon cancer ([place])    Allergies  Allergen Reactions  . Aspirin     REACTION: Hives  . Penicillins     REACTION: Hives    Family history and social history have been reviewed.  Review of Systems:   The remainder of the 10 point ROS is negative except as outlined in the H&P  Physical Exam: General Appearance Well developed, in no distress,overweight Eyes  Non icteric  HEENT  Non traumatic, normocephalic  Mouth No lesion, tongue papillated, no  cheilosis Neck Supple without adenopathy, thyroid not enlarged, no carotid bruits, no JVD Lungs Clear to auscultation bilaterally COR Normal S1, normal S2, regular rhythm, no murmur, quiet precordium Abdomen soft protuberant with normoactive bowel sounds. Mild tenderness right middle quadrant. Liver edge at costal margin. Rectal not done. Extremities  No pedal edema Skin No lesions Neurological Alert and oriented x 3 Psychological Normal mood and affect  Assessment and Plan:   Problem #64 80 year old white female on Plavix 75 mg daily. She is due for a recall colonoscopy for follow-up of a ascending colon adenocarcinoma resected  in December 2012. We have discussed the sedation, prep and the procedure. She is interested in proceeding as soon as we can schedule it. We will ask for permission to hold the Plavix prior to the procedure.    Delfin Edis 12/20/2013

## 2013-12-20 NOTE — Telephone Encounter (Signed)
12/20/2013  RE: Gina [name] DOB: September 04, 1931 MRN: [id number]  Dear Dr Alain Marion,   We have scheduled the above patient for a colonoscopy procedure. Our records show that she is on anticoagulation therapy.  Please advise as to whether the patient may come off her therapy of Plavix prior to the procedure, which is scheduled for 01/29/14. Please route your response to Dixon Boos, CMA  Sincerely,  Dixon Boos

## 2013-12-20 NOTE — Patient Instructions (Addendum)
You have been scheduled for a colonoscopy. Please follow written instructions given to you at your visit today.  Please pick up your prep kit at the pharmacy within the next 1-3 days. If you use inhalers (even only as needed), please bring them with you on the day of your procedure. Your physician has requested that you go to www.startemmi.com and enter the access code given to you at your visit today. This web site gives a general overview about your procedure. However, you should still follow specific instructions given to you by our office regarding your preparation for the procedure.  CC:Dr Shadad 

## 2013-12-21 ENCOUNTER — Encounter: Payer: Self-pay | Admitting: Internal Medicine

## 2014-01-06 ENCOUNTER — Telehealth: Payer: Self-pay | Admitting: Internal Medicine

## 2014-01-06 NOTE — Telephone Encounter (Signed)
I have spoken to [facility], Dr Plotnikov's nurse. She states that she will try to get him to respond to note so I can advise patient what she is to do about Plavix for procedure.

## 2014-01-06 NOTE — Telephone Encounter (Signed)
Mrs. Baldonado is out of town and has a UTI.  She is requesting a rx for Cipro. She as taken Cipro in the past.   Please send to [facility] in [place], [place] [phone].  Please let her know if you are able to do this.  She can be reached on her cell phone [phone].

## 2014-01-07 MED ORDER — CIPROFLOXACIN HCL 250 MG PO TABS: 250.0000 mg | ORAL_TABLET | Freq: Two times a day (BID) | ORAL | Status: DC

## 2014-01-07 NOTE — Telephone Encounter (Signed)
Notified pt md ok cipro sent to [facility]...[name]

## 2014-01-07 NOTE — Telephone Encounter (Signed)
Ok Cipro 250 mg po bid x 5 d Thx

## 2014-01-07 NOTE — Telephone Encounter (Signed)
I have spoken to patient to advise that per Dr Alain Marion, she may hold Plavix 5 days prior to her procedure. She verbalizes understanding.

## 2014-01-07 NOTE — Telephone Encounter (Signed)
Hold Plavix per your protocol (5-7 d prior to the procedure) Thx

## 2014-01-29 ENCOUNTER — Encounter: Payer: Self-pay | Admitting: Internal Medicine

## 2014-01-29 ENCOUNTER — Ambulatory Visit (AMBULATORY_SURGERY_CENTER): Payer: Medicare Other | Admitting: Internal Medicine

## 2014-01-29 VITALS — BP 128/72 | HR 65 | Temp 97.0°F | Resp 19 | Ht 62.0 in | Wt 187.0 lb

## 2014-01-29 DIAGNOSIS — Z8673 Personal history of transient ischemic attack (TIA), and cerebral infarction without residual deficits: Secondary | ICD-10-CM | POA: Diagnosis not present

## 2014-01-29 DIAGNOSIS — Z85038 Personal history of other malignant neoplasm of large intestine: Secondary | ICD-10-CM

## 2014-01-29 DIAGNOSIS — I10 Essential (primary) hypertension: Secondary | ICD-10-CM | POA: Diagnosis not present

## 2014-01-29 MED ORDER — HYDROCORTISONE ACE-PRAMOXINE 1-1 % RE CREA: 1.0000 "application " | TOPICAL_CREAM | Freq: Two times a day (BID) | RECTAL | Status: DC

## 2014-01-29 MED ORDER — SODIUM CHLORIDE 0.9 % IV SOLN: 500.0000 mL | INTRAVENOUS | Status: DC

## 2014-01-29 NOTE — Progress Notes (Signed)
Report to PACU, RN, vss, BBS= Clear.  

## 2014-01-29 NOTE — Patient Instructions (Signed)

## 2014-01-29 NOTE — Op Note (Addendum)
[place]  [address] [place], [address]   COLONOSCOPY PROCEDURE REPORT  PATIENT: Gina, [name]  MR#: [id number] BIRTHDATE: [birth year] , 82  yrs. old GENDER: female ENDOSCOPIST: Lafayette Dragon, MD REFERRED HL:[id number] Avel Sensor, M.D. , Dr Alen Blew PROCEDURE DATE:  01/29/2014 PROCEDURE:   Colonoscopy, screening First Screening Colonoscopy - Avg.  risk and is 50 yrs.  old or older - No.  Prior Negative Screening - Now for repeat screening. N/A  History of Adenoma - Now for follow-up colonoscopy & has been > or = to 3 yrs.  N/A  Polyps Removed Today? No. ASA CLASS:   Class II INDICATIONS:T3N0 adenocarcinoma of the ascending colon resected in December 2012.  Restaging CT scan in April 2015 was negative for active disease.  Last colonoscopy December 2013 was normal.  Plavix has been on hold. MEDICATIONS: Monitored anesthesia care and Propofol 300 mg IV  DESCRIPTION OF PROCEDURE:   After the risks benefits and alternatives of the procedure were thoroughly explained, informed consent was obtained.  The digital rectal exam revealed no abnormalities of the rectum.   The LB 1528  endoscope was introduced through the anus and advanced to the cecum, which was identified by both the appendix and ileocecal valve. No adverse events experienced.   The quality of the prep was good, using MoviPrep  The instrument was then slowly withdrawn as the colon was fully examined.      COLON FINDINGS: There was evidence of a prior surgical anastomosis with an intact staple line.   There was mild diverticulosis noted in the sigmoid colon with associated muscular hypertrophy. Retroflexed views revealed no abnormalities. The time to cecum=4 minutes 07 seconds.  Withdrawal time=6 minutes 47 seconds.  The scope was withdrawn and the procedure completed. COMPLICATIONS: There were no immediate complications.  ENDOSCOPIC IMPRESSION: 1.   There was evidence of prior surgical  anastomosis ,widely open ileocolonic anastomosis 2.   There was mild diverticulosis noted in the sigmoid colon 3.there was no evidence of recurrent cancer  RECOMMENDATIONS: High fiber diet Follow-up with Dr.Shadad in April 2016 Recall colonoscopy in 3 years if general health permits Analpram cream 1% twice a day when necessary serum Plavix tomorrow  eSigned:  Lafayette Dragon, MD 01/29/2014 4:04 PM Revised: 01/29/2014 4:04 PM  cc:   PATIENT NAME:  Gina, [name] MR#: [id number]

## 2014-01-30 ENCOUNTER — Telehealth: Payer: Self-pay

## 2014-01-30 NOTE — Telephone Encounter (Signed)
  Follow up Call-  Call back number 01/29/2014 02/01/2012  Post procedure Call Back phone  # [phone]  Permission to leave phone message Yes Yes     Patient questions:  Do you have a fever, pain , or abdominal swelling? No. Pain Score  0 *  Have you tolerated food without any problems? Yes.    Have you been able to return to your normal activities? Yes.    Do you have any questions about your discharge instructions: Diet   No. Medications  No. Follow up visit  No.  Do you have questions or concerns about your Care? No.  Actions: * If pain score is 4 or above: No action needed, pain <4.  Per the pt she had difficulity going to sleep.  No other problems noted. maw

## 2014-03-10 ENCOUNTER — Ambulatory Visit: Payer: Medicare Other | Admitting: Internal Medicine

## 2014-03-13 ENCOUNTER — Telehealth: Payer: Self-pay | Admitting: Oncology

## 2014-03-13 NOTE — Telephone Encounter (Signed)
Pt called to confirmed labs/CT scan for March, gave information and pt confirmed.... KJ

## 2014-03-18 ENCOUNTER — Ambulatory Visit (INDEPENDENT_AMBULATORY_CARE_PROVIDER_SITE_OTHER): Payer: Medicare Other | Admitting: Internal Medicine

## 2014-03-18 ENCOUNTER — Encounter: Payer: Self-pay | Admitting: Internal Medicine

## 2014-03-18 ENCOUNTER — Other Ambulatory Visit (INDEPENDENT_AMBULATORY_CARE_PROVIDER_SITE_OTHER): Payer: Medicare Other

## 2014-03-18 VITALS — BP 130/66 | HR 77 | Temp 97.9°F | Wt 185.0 lb

## 2014-03-18 DIAGNOSIS — Z515 Encounter for palliative care: Secondary | ICD-10-CM | POA: Insufficient documentation

## 2014-03-18 DIAGNOSIS — I10 Essential (primary) hypertension: Secondary | ICD-10-CM

## 2014-03-18 DIAGNOSIS — Z7184 Encounter for health counseling related to travel: Secondary | ICD-10-CM

## 2014-03-18 DIAGNOSIS — Z7189 Other specified counseling: Secondary | ICD-10-CM | POA: Diagnosis not present

## 2014-03-18 DIAGNOSIS — M8580 Other specified disorders of bone density and structure, unspecified site: Secondary | ICD-10-CM | POA: Diagnosis not present

## 2014-03-18 DIAGNOSIS — E785 Hyperlipidemia, unspecified: Secondary | ICD-10-CM

## 2014-03-18 LAB — BASIC METABOLIC PANEL
BUN: 22 mg/dL (ref 6–23)
CO2: 31 mEq/L (ref 19–32)
Calcium: 9.6 mg/dL (ref 8.4–10.5)
Chloride: 103 mEq/L (ref 96–112)
Creatinine, Ser: 0.8 mg/dL (ref 0.40–1.20)
GFR: 73.22 mL/min (ref >60.00)
Glucose, Bld: 101 mg/dL — ABNORMAL HIGH (ref 70–99)
Potassium: 4.1 mEq/L (ref 3.5–5.1)
Sodium: 140 mEq/L (ref 135–145)

## 2014-03-18 LAB — LIPID PANEL
Cholesterol: 162 mg/dL (ref 0–200)
HDL: 62 mg/dL (ref >39.00)
NonHDL: 100
Total CHOL/HDL Ratio: 3
Triglycerides: 279 mg/dL — ABNORMAL HIGH (ref 0.0–149.0)
VLDL: 55.8 mg/dL — ABNORMAL HIGH (ref 0.0–40.0)

## 2014-03-18 LAB — TSH: TSH: 2.01 u[IU]/mL (ref 0.35–4.50)

## 2014-03-18 LAB — LDL CHOLESTEROL, DIRECT: Direct LDL: 65 mg/dL

## 2014-03-18 MED ORDER — CIPROFLOXACIN HCL 500 MG PO TABS: 500.0000 mg | ORAL_TABLET | Freq: Two times a day (BID) | ORAL | Status: DC

## 2014-03-18 NOTE — Assessment & Plan Note (Signed)
Cipro DVT prophylaxis discussed

## 2014-03-18 NOTE — Progress Notes (Signed)
Subjective:    HPI     Pt is going to [place] - bus tour The patient presents for a follow-up of  chronic hypertension, chronic dyslipidemia, LBP controlled with medicines  Wt Readings from Last 3 Encounters:  03/18/14 185 lb (83.915 kg)  01/29/14 187 lb (84.823 kg)  12/20/13 187 lb (84.823 kg)   BP Readings from Last 3 Encounters:  03/18/14 130/66  01/29/14 128/72  12/20/13 136/60      Review of Systems  Constitutional: Positive for fatigue. Negative for fever, chills, diaphoresis, activity change, appetite change and unexpected weight change.  HENT: Negative for congestion, dental problem, ear pain, hearing loss, mouth sores, postnasal drip, sinus pressure, sneezing, sore throat and voice change.   Eyes: Positive for visual disturbance. Negative for pain.  Respiratory: Negative for cough, chest tightness, wheezing and stridor.   Cardiovascular: Negative for chest pain, palpitations and leg swelling.  Gastrointestinal: Negative for nausea, vomiting, abdominal pain, blood in stool, abdominal distention and rectal pain.  Genitourinary: Negative for dysuria, frequency, hematuria, decreased urine volume, vaginal bleeding, vaginal discharge, difficulty urinating, vaginal pain and menstrual problem.  Musculoskeletal: Positive for back pain and arthralgias. Negative for joint swelling, gait problem and neck pain.  Skin: Negative for color change, rash and wound.  Neurological: Negative for dizziness, tremors, syncope, speech difficulty, weakness and light-headedness.  Hematological: Negative for adenopathy.  Psychiatric/Behavioral: Negative for suicidal ideas, hallucinations, behavioral problems, confusion, sleep disturbance, dysphoric mood and decreased concentration. The patient is not nervous/anxious and is not hyperactive.        Objective:   Physical Exam  Constitutional: She appears well-developed. No distress.  Obese   HENT:  Head: Normocephalic.  Right Ear: External  ear normal.  Left Ear: External ear normal.  Nose: Nose normal.  Mouth/Throat: Oropharynx is clear and moist.  Eyes: Conjunctivae are normal. Pupils are equal, round, and reactive to light. Right eye exhibits no discharge. Left eye exhibits no discharge.  Neck: Normal range of motion. Neck supple. No JVD present. No tracheal deviation present. No thyromegaly present.  Cardiovascular: Normal rate, regular rhythm and normal heart sounds.   Pulmonary/Chest: No stridor. No respiratory distress. She has no wheezes.  Abdominal: Soft. Bowel sounds are normal. She exhibits no distension and no mass. There is no tenderness. There is no rebound and no guarding.  Musculoskeletal: She exhibits no edema or tenderness.  Lymphadenopathy:    She has no cervical adenopathy.  Neurological: She displays normal reflexes. No cranial nerve deficit. She exhibits normal muscle tone. Coordination normal.  Skin: No rash noted. No erythema.  Psychiatric: She has a normal mood and affect. Her behavior is normal. Judgment and thought content normal.    Lab Results  Component Value Date   WBC 4.9 12/13/2013   HGB 13.8 12/13/2013   HCT 42.9 12/13/2013   PLT 228 12/13/2013   GLUCOSE 116 12/13/2013   CHOL 160 09/04/2012   TRIG 202.0* 09/04/2012   HDL 58.30 09/04/2012   LDLDIRECT 73.3 09/04/2012   LDLCALC 67 12/03/2009   ALT 20 12/13/2013   AST 23 12/13/2013   NA 142 12/13/2013   K 4.0 12/13/2013   CL 99 09/06/2013   CREATININE 0.9 12/13/2013   BUN 26.0 12/13/2013   CO2 27 12/13/2013   TSH 2.05 09/06/2013   INR 0.94 06/07/2010   HGBA1C * 11/12/2009    5.7 (NOTE)  According to the ADA Clinical Practice Recommendations for 2011, when HbA1c is used as a screening test:   >=6.5%   Diagnostic of Diabetes Mellitus           (if abnormal result  is confirmed)  5.7-6.4%   Increased risk of developing Diabetes Mellitus  References:Diagnosis and  Classification of Diabetes Mellitus,Diabetes AFBX,0383,33(OVANV 1):S62-S69 and Standards of Medical Care in         Diabetes - 2011,Diabetes BTYO,0600,45  (Suppl 1):S11-S61.          Assessment & Plan:

## 2014-03-18 NOTE — Patient Instructions (Signed)
Valerian root for sleep

## 2014-03-18 NOTE — Assessment & Plan Note (Signed)
BDS Vit D Chair exercises

## 2014-03-18 NOTE — Assessment & Plan Note (Signed)
Continue with current prescription therapy as reflected on the Med list.  

## 2014-03-18 NOTE — Progress Notes (Signed)
Pre visit review using our clinic review tool, if applicable. No additional management support is needed unless otherwise documented below in the visit note. 

## 2014-03-24 DIAGNOSIS — M8580 Other specified disorders of bone density and structure, unspecified site: Secondary | ICD-10-CM | POA: Diagnosis not present

## 2014-03-24 DIAGNOSIS — Z1231 Encounter for screening mammogram for malignant neoplasm of breast: Secondary | ICD-10-CM | POA: Diagnosis not present

## 2014-03-24 DIAGNOSIS — Z803 Family history of malignant neoplasm of breast: Secondary | ICD-10-CM | POA: Diagnosis not present

## 2014-04-23 DIAGNOSIS — Z1283 Encounter for screening for malignant neoplasm of skin: Secondary | ICD-10-CM | POA: Diagnosis not present

## 2014-04-23 DIAGNOSIS — L259 Unspecified contact dermatitis, unspecified cause: Secondary | ICD-10-CM | POA: Diagnosis not present

## 2014-04-28 DIAGNOSIS — H43813 Vitreous degeneration, bilateral: Secondary | ICD-10-CM | POA: Diagnosis not present

## 2014-04-28 DIAGNOSIS — H35363 Drusen (degenerative) of macula, bilateral: Secondary | ICD-10-CM | POA: Diagnosis not present

## 2014-04-28 DIAGNOSIS — H3531 Nonexudative age-related macular degeneration: Secondary | ICD-10-CM | POA: Diagnosis not present

## 2014-05-01 ENCOUNTER — Encounter: Payer: Self-pay | Admitting: Internal Medicine

## 2014-05-06 ENCOUNTER — Other Ambulatory Visit (HOSPITAL_BASED_OUTPATIENT_CLINIC_OR_DEPARTMENT_OTHER): Payer: Medicare Other

## 2014-05-06 ENCOUNTER — Ambulatory Visit (HOSPITAL_COMMUNITY)
Admission: RE | Admit: 2014-05-06 | Discharge: 2014-05-06 | Disposition: A | Discharge status: Home / Self Care | Payer: Medicare Other | Source: Ambulatory Visit | Attending: Oncology | Admitting: Oncology

## 2014-05-06 DIAGNOSIS — I10 Essential (primary) hypertension: Secondary | ICD-10-CM | POA: Diagnosis not present

## 2014-05-06 DIAGNOSIS — E785 Hyperlipidemia, unspecified: Secondary | ICD-10-CM | POA: Insufficient documentation

## 2014-05-06 DIAGNOSIS — Z9889 Other specified postprocedural states: Secondary | ICD-10-CM | POA: Diagnosis not present

## 2014-05-06 DIAGNOSIS — Z85038 Personal history of other malignant neoplasm of large intestine: Secondary | ICD-10-CM

## 2014-05-06 DIAGNOSIS — C182 Malignant neoplasm of ascending colon: Secondary | ICD-10-CM | POA: Diagnosis not present

## 2014-05-06 DIAGNOSIS — C189 Malignant neoplasm of colon, unspecified: Secondary | ICD-10-CM | POA: Diagnosis not present

## 2014-05-06 LAB — CBC WITH DIFFERENTIAL/PLATELET
BASO%: 0.4 % (ref 0.0–2.0)
Basophils Absolute: 0 10*3/uL (ref 0.0–0.1)
EOS%: 2.9 % (ref 0.0–7.0)
Eosinophils Absolute: 0.2 10*3/uL (ref 0.0–0.5)
HCT: 44 % (ref 34.8–46.6)
HGB: 14.2 g/dL (ref 11.6–15.9)
LYMPH%: 25.5 % (ref 14.0–49.7)
MCH: 32.6 pg (ref 25.1–34.0)
MCHC: 32.3 g/dL (ref 31.5–36.0)
MCV: 101.1 fL — ABNORMAL HIGH (ref 79.5–101.0)
MONO#: 0.4 10*3/uL (ref 0.1–0.9)
MONO%: 7.9 % (ref 0.0–14.0)
NEUT#: 3.5 10*3/uL (ref 1.5–6.5)
NEUT%: 63.3 % (ref 38.4–76.8)
Platelets: 216 10*3/uL (ref 145–400)
RBC: 4.35 10*6/uL (ref 3.70–5.45)
RDW: 13.9 % (ref 11.2–14.5)
WBC: 5.6 10*3/uL (ref 3.9–10.3)
lymph#: 1.4 10*3/uL (ref 0.9–3.3)

## 2014-05-06 LAB — CEA: CEA: 2.2 ng/mL (ref 0.0–5.0)

## 2014-05-06 LAB — COMPREHENSIVE METABOLIC PANEL (CC13)
ALT: 21 U/L (ref 0–55)
AST: 26 U/L (ref 5–34)
Albumin: 4.1 g/dL (ref 3.5–5.0)
Alkaline Phosphatase: 77 U/L (ref 40–150)
Anion Gap: 14 mEq/L — ABNORMAL HIGH (ref 3–11)
BUN: 23.7 mg/dL (ref 7.0–26.0)
CO2: 26 mEq/L (ref 22–29)
Calcium: 9.5 mg/dL (ref 8.4–10.4)
Chloride: 101 mEq/L (ref 98–109)
Creatinine: 0.8 mg/dL (ref 0.6–1.1)
EGFR: 66 mL/min/{1.73_m2} — ABNORMAL LOW (ref >90)
Glucose: 104 mg/dl (ref 70–140)
Potassium: 4.2 mEq/L (ref 3.5–5.1)
Sodium: 141 mEq/L (ref 136–145)
Total Bilirubin: 0.73 mg/dL (ref 0.20–1.20)
Total Protein: 7.3 g/dL (ref 6.4–8.3)

## 2014-05-06 MED ORDER — IOHEXOL 300 MG/ML  SOLN
100.0000 mL | Freq: Once | INTRAMUSCULAR | Status: AC | PRN
  Administered 2014-05-06: 100 mL via INTRAVENOUS

## 2014-05-08 ENCOUNTER — Telehealth: Payer: Self-pay | Admitting: Oncology

## 2014-05-08 ENCOUNTER — Ambulatory Visit (HOSPITAL_BASED_OUTPATIENT_CLINIC_OR_DEPARTMENT_OTHER): Payer: Medicare Other | Admitting: Oncology

## 2014-05-08 VITALS — BP 144/63 | HR 85 | Temp 98.1°F | Resp 17 | Ht 62.0 in | Wt 187.7 lb

## 2014-05-08 DIAGNOSIS — C182 Malignant neoplasm of ascending colon: Secondary | ICD-10-CM

## 2014-05-08 NOTE — Addendum Note (Signed)
Addended by: Randolm Idol on: 05/08/2014 10:59 AM   Modules accepted: Medications

## 2014-05-08 NOTE — Telephone Encounter (Signed)
Gave avs & calendar for September °

## 2014-05-08 NOTE — Progress Notes (Signed)
Hematology and Oncology Follow Up Visit  Gina [name] [id number] November 04, 1931 81 y.o. 05/08/2014 10:35 AM    Principle Diagnosis: 81 year old woman T3 N0 (0 of 19 lymph nodes) poorly-differentiated adenocarcinoma of the ascending colon. Diagnosed in 01/2011.   Prior Therapy: Status post laparoscopic-assisted right hemicolectomy on 01/21/2011.  Current therapy: Observation and follow up.   Interim History:  Gina [name] presents today for a follow up visit. Since the last visit, she has been doing well . She underwent a colonoscopy in December 2015 without any incident. She reports no new GI symptoms. She still reports occasional diarrhea which has been manageable. Her weight remains stable. She denies rectal bleeding. She continues to be active and planning a trip to [place] in the near future. She has not reported any headaches or blurry vision or double vision. She has not reported any chest pain or shortness of breath. Has not reported any frequency urgency or hesitancy. Has not reported any musca skeletal complaints. Rest of the review of system is unremarkable.  Medications: I have reviewed the patient's current medications. Current Outpatient Prescriptions  Medication Sig Dispense Refill  . acetaminophen (TYLENOL) 500 MG tablet Take 500 mg by mouth every 6 (six) hours as needed. For pain    . allopurinol (ZYLOPRIM) 100 MG tablet Take 1 tablet (100 mg total) by mouth daily. 90 tablet 3  . amLODipine (NORVASC) 5 MG tablet Take 1 tablet (5 mg total) by mouth daily. 90 tablet 3  . Ascorbic Acid (VITAMIN C) 100 MG tablet Take 500 mg by mouth daily.     [name] atorvastatin (LIPITOR) 40 MG tablet Take 1 tablet (40 mg total) by mouth daily. 90 tablet 3  . Calcium Carbonate (CALCIUM 600 PO) Take 600 mg by mouth 2 (two) times daily.     . ciprofloxacin (CIPRO) 500 MG tablet Take 1 tablet (500 mg total) by mouth 2 (two) times daily. 20 tablet 0  . Clocortolone Pivalate (CLODERM) 0.1 % cream Apply 1  application topically daily.    . clopidogrel (PLAVIX) 75 MG tablet Take 1 tablet (75 mg total) by mouth daily. 90 tablet 3  . enalapril (VASOTEC) 10 MG tablet Take 1 tablet (10 mg total) by mouth daily. 90 tablet 3  . hydrochlorothiazide (MICROZIDE) 12.5 MG capsule Take 1 capsule (12.5 mg total) by mouth daily. 90 capsule 3  . Loratadine 10 MG CAPS Take 10 mg by mouth daily.     . MULTIPLE VITAMIN PO Take 1 tablet by mouth daily.     . Multiple Vitamins-Minerals (OCUVITE PRESERVISION) TABS Take 1 tablet by mouth 2 (two) times daily.     . pramoxine-hydrocortisone (PROCTOCREAM-HC) 1-1 % rectal cream Place 1 application rectally 2 (two) times daily. 30 g 1   No current facility-administered medications for this visit.    Allergies:  Allergies  Allergen Reactions  . Aspirin     REACTION: Hives  . Penicillins     REACTION: Hives    Past Medical History, Surgical history, Social history, and Family History were reviewed and updated.   Physical Exam: Blood pressure 144/63, pulse 85, temperature 98.1 F (36.7 C), temperature source Oral, resp. rate 17, height 5\' 2"  (1.575 m), weight 187 lb 11.2 oz (85.14 kg), SpO2 92 %. ECOG: 1 General appearance: alert awake not in any distress. Head: Normocephalic, without obvious abnormality Neck: no adenopathy Lymph nodes: Cervical, supraclavicular, and axillary nodes normal. Heart:regular rate and rhythm, S1, S2 normal, no murmur, click, rub or gallop Lung:chest clear,  no wheezing, rales, normal symmetric air entry Abdomin: soft, non-tender, without masses or organomegaly EXT:no erythema, induration, or nodules   Lab Results: Lab Results  Component Value Date   WBC 5.6 05/06/2014   HGB 14.2 05/06/2014   HCT 44.0 05/06/2014   MCV 101.1* 05/06/2014   PLT 216 05/06/2014     Chemistry      Component Value Date/Time   NA 141 05/06/2014 1105   NA 140 03/18/2014 1018   NA 143 05/30/2011 1150   K 4.2 05/06/2014 1105   K 4.1 03/18/2014  1018   K 3.8 05/30/2011 1150   CL 103 03/18/2014 1018   CL 101 06/20/2012 0922   CL 97* 05/30/2011 1150   CO2 26 05/06/2014 1105   CO2 31 03/18/2014 1018   CO2 30 05/30/2011 1150   BUN 23.7 05/06/2014 1105   BUN 22 03/18/2014 1018   BUN 18 05/30/2011 1150   CREATININE 0.8 05/06/2014 1105   CREATININE 0.80 03/18/2014 1018   CREATININE 0.8 05/30/2011 1150      Component Value Date/Time   CALCIUM 9.5 05/06/2014 1105   CALCIUM 9.6 03/18/2014 1018   CALCIUM 9.6 05/30/2011 1150   ALKPHOS 77 05/06/2014 1105   ALKPHOS 74 09/06/2013 1047   ALKPHOS 78 05/30/2011 1150   AST 26 05/06/2014 1105   AST 24 09/06/2013 1047   AST 28 05/30/2011 1150   ALT 21 05/06/2014 1105   ALT 22 09/06/2013 1047   ALT 26 05/30/2011 1150   BILITOT 0.73 05/06/2014 1105   BILITOT 0.8 09/06/2013 1047   BILITOT 0.90 05/30/2011 1150      Results for Gina [name], Gina [name] (MRN [id number]) as of 05/08/2014 10:22  Ref. Range 05/06/2014 11:05  CEA Latest Range: 0.0-5.0 ng/mL 2.2   FINDINGS: Lower chest: Lingular volume loss and scarring. No change in a vague 5 mm left lower lobe subpleural pulmonary nodule, benign. Mild cardiomegaly with coronary artery atherosclerosis. No pericardial or pleural effusion.  Hepatobiliary: Mild hepatic steatosis, without focal liver lesion. Normal gallbladder, without biliary ductal dilatation.  Pancreas: Normal, without mass or ductal dilatation.  Spleen: Normal  Adrenals/Urinary Tract: Normal adrenal glands. Bilateral too small to characterize renal lesions. Interpolar right renal cyst. 3 mm left renal collecting system calculus. No hydronephrosis. Pelvic floor laxity. Mild cystocele.  Stomach/Bowel: Normal stomach, without wall thickening. Extensive colonic diverticulosis. Partial right hemicolectomy. Normal small bowel.  Vascular/Lymphatic: Advanced aortic and branch vessel atherosclerosis. No abdominopelvic adenopathy.  Reproductive: Normal appearance of the  uterus, other than pelvic floor laxity. No adnexal mass.  Other: No significant free fluid. No evidence of omental or peritoneal disease. Areas of anterior abdominal and pelvic wall laxity containing fat. The abdominal wall laxity contains nonobstructive transverse colon.  Musculoskeletal: Bone island in the right ischium. Degenerative disc disease at L4-5 and L5-S1. Trace L4-5 anterolisthesis is likely degenerative.  IMPRESSION: 1. Partial right hemicolectomy, without recurrent or metastatic disease. 2. Mild hepatic steatosis. 3. Pelvic floor laxity with a cystocele.   Impression and Plan:   81 year old woman with:  1. T3 N0 poorly-differentiated adenocarcinoma of the ascending colon. She is status post laparoscopic-assisted right hemicolectomy on 01/21/2011.   Her CT scan and labs from  05/06/2014 were discussed and  showed no evidence to suggest cancer relapse.  She is doing well without any clinical signs or symptoms of relapse. The plan is to continue with active surveillance and repeat laboratory testing in 6 months and imaging studies in 12 months.  2. Colonoscopy screen: She  is up to date with her last one on December 2015. This will be repeated in 3 years.  3. Loose bowel movements: This appears to be under reasonable control for the time being. She takes Imodium as needed.     [facility], MD 3/24/201610:35 AM

## 2014-05-15 ENCOUNTER — Encounter: Payer: Self-pay | Admitting: Internal Medicine

## 2014-05-16 ENCOUNTER — Ambulatory Visit (INDEPENDENT_AMBULATORY_CARE_PROVIDER_SITE_OTHER): Payer: Medicare Other | Admitting: Internal Medicine

## 2014-05-16 ENCOUNTER — Other Ambulatory Visit: Payer: Self-pay | Admitting: *Deleted

## 2014-05-16 DIAGNOSIS — R3915 Urgency of urination: Secondary | ICD-10-CM

## 2014-05-16 DIAGNOSIS — R300 Dysuria: Secondary | ICD-10-CM

## 2014-05-17 ENCOUNTER — Encounter: Payer: Self-pay | Admitting: Family Medicine

## 2014-05-17 ENCOUNTER — Ambulatory Visit (INDEPENDENT_AMBULATORY_CARE_PROVIDER_SITE_OTHER): Payer: Medicare Other | Admitting: Family Medicine

## 2014-05-17 VITALS — BP 124/80 | HR 83 | Temp 97.9°F | Ht 62.0 in | Wt 186.5 lb

## 2014-05-17 DIAGNOSIS — R300 Dysuria: Secondary | ICD-10-CM

## 2014-05-17 DIAGNOSIS — R3915 Urgency of urination: Secondary | ICD-10-CM | POA: Diagnosis not present

## 2014-05-17 LAB — POCT URINALYSIS DIPSTICK
Bilirubin, UA: NEGATIVE
Blood, UA: NEGATIVE
Glucose, UA: NEGATIVE
Ketones, UA: NEGATIVE
Leukocytes, UA: NEGATIVE
Nitrite, UA: NEGATIVE
Spec Grav, UA: 1.01
Urobilinogen, UA: NEGATIVE
pH, UA: 7.5

## 2014-05-17 MED ORDER — CIPROFLOXACIN HCL 250 MG PO TABS
250.0000 mg | ORAL_TABLET | Freq: Two times a day (BID) | ORAL | Status: DC
Start: 2014-05-17 — End: 2014-11-06

## 2014-05-17 NOTE — Patient Instructions (Signed)

## 2014-05-17 NOTE — Progress Notes (Signed)
   Subjective:    Patient ID: Gina [name], female    DOB: 15-Jun-1931, 81 y.o.   MRN: [id number]  HPI    Review of Systems     Objective:   Physical Exam        Assessment & Plan:  Patient could not arrange transport to office for visit

## 2014-05-17 NOTE — Progress Notes (Signed)
Pre visit review using our clinic review tool, if applicable. No additional management support is needed unless otherwise documented below in the visit note. 

## 2014-05-17 NOTE — Assessment & Plan Note (Signed)
Pt symptoms are improving but she is leaving the country Monday am and just would like to take some cipro with her in case she has a problem while she is out of the country.  We were able to dip the urine but there was not enough to culture

## 2014-05-17 NOTE — Progress Notes (Signed)
Subjective:    Patient ID: Gina [name], female    DOB: Jan 21, 1932, 81 y.o.   MRN: [id number]  HPI  Patient here c/o urinary frequency--- she was put on cipro 250 on Wednesday.  Her symptoms are improving but she is leaving the country this week and is concerned she needs more or 500 mg.   UA / culture was not done.    Past Medical History  Diagnosis Date  . Unspecified transient cerebral ischemia sept '11  . Hyperplastic colon polyp   . Diverticulosis of colon (without mention of hemorrhage)   . Hypertension   . Hyperlipidemia   . Gout   . Allergic rhinitis   . GI bleed   . Arthritis   . Stroke     TIA  . Colon cancer Dec 12    Review of Systems  Constitutional: Negative for fever, chills, activity change and appetite change.  Gastrointestinal: Negative for abdominal pain and abdominal distention.  Genitourinary: Positive for dysuria and frequency. Negative for urgency, hematuria, flank pain, vaginal discharge, difficulty urinating, genital sores, vaginal pain, menstrual problem, pelvic pain and dyspareunia.  Musculoskeletal: Negative for back pain.    Current Outpatient Prescriptions on File Prior to Visit  Medication Sig Dispense Refill  . acetaminophen (TYLENOL) 500 MG tablet Take 500 mg by mouth every 6 (six) hours as needed. For pain    . allopurinol (ZYLOPRIM) 100 MG tablet Take 1 tablet (100 mg total) by mouth daily. 90 tablet 3  . amLODipine (NORVASC) 5 MG tablet Take 1 tablet (5 mg total) by mouth daily. 90 tablet 3  . Ascorbic Acid (VITAMIN C) 100 MG tablet Take 500 mg by mouth daily.     [name] atorvastatin (LIPITOR) 40 MG tablet Take 1 tablet (40 mg total) by mouth daily. 90 tablet 3  . Calcium Carbonate (CALCIUM 600 PO) Take 600 mg by mouth 2 (two) times daily.     . ciprofloxacin (CIPRO) 500 MG tablet Take 1 tablet (500 mg total) by mouth 2 (two) times daily. 20 tablet 0  . clobetasol cream (TEMOVATE) 0.05 % Use as directed  3  . Clocortolone Pivalate  (CLODERM) 0.1 % cream Apply 1 application topically daily.    . clopidogrel (PLAVIX) 75 MG tablet Take 1 tablet (75 mg total) by mouth daily. 90 tablet 3  . enalapril (VASOTEC) 10 MG tablet Take 1 tablet (10 mg total) by mouth daily. 90 tablet 3  . hydrochlorothiazide (MICROZIDE) 12.5 MG capsule Take 1 capsule (12.5 mg total) by mouth daily. 90 capsule 3  . Loratadine 10 MG CAPS Take 10 mg by mouth daily.     . MULTIPLE VITAMIN PO Take 1 tablet by mouth daily.     . Multiple Vitamins-Minerals (OCUVITE PRESERVISION) TABS Take 1 tablet by mouth 2 (two) times daily.     . pramoxine-hydrocortisone (PROCTOCREAM-HC) 1-1 % rectal cream Place 1 application rectally 2 (two) times daily. 30 g 1   No current facility-administered medications on file prior to visit.       Objective:    Physical Exam  Constitutional: She appears well-developed and well-nourished. No distress.  Abdominal: Soft. She exhibits no distension. There is no tenderness (no suprapubic or CVA tenderness).  Psychiatric: She has a normal mood and affect. Her behavior is normal.  Vitals reviewed.   BP 124/80 mmHg  Pulse 83  Temp(Src) 97.9 F (36.6 C) (Oral)  Ht 5\' 2"  (1.575 m)  Wt 186 lb 8 oz (84.596 kg)  BMI 34.10 kg/m2  SpO2 92% Wt Readings from Last 3 Encounters:  05/17/14 186 lb 8 oz (84.596 kg)  05/08/14 187 lb 11.2 oz (85.14 kg)  03/18/14 185 lb (83.915 kg)     Lab Results  Component Value Date   WBC 5.6 05/06/2014   HGB 14.2 05/06/2014   HCT 44.0 05/06/2014   PLT 216 05/06/2014   GLUCOSE 104 05/06/2014   CHOL 162 03/18/2014   TRIG 279.0* 03/18/2014   HDL 62.00 03/18/2014   LDLDIRECT 65.0 03/18/2014   LDLCALC 67 12/03/2009   ALT 21 05/06/2014   AST 26 05/06/2014   NA 141 05/06/2014   K 4.2 05/06/2014   CL 103 03/18/2014   CREATININE 0.8 05/06/2014   BUN 23.7 05/06/2014   CO2 26 05/06/2014   TSH 2.01 03/18/2014   INR 0.94 06/07/2010   HGBA1C * 11/12/2009    5.7 (NOTE)                                                                        According to the ADA Clinical Practice Recommendations for 2011, when HbA1c is used as a screening test:   >=6.5%   Diagnostic of Diabetes Mellitus           (if abnormal result  is confirmed)  5.7-6.4%   Increased risk of developing Diabetes Mellitus  References:Diagnosis and Classification of Diabetes Mellitus,Diabetes MHDQ,2229,79(GXQJJ 1):S62-S69 and Standards of Medical Care in         Diabetes - 2011,Diabetes Care,2011,34  (Suppl 1):S11-S61.  UA---neg     Assessment & Plan:   Problem List Items Addressed This Visit    Dysuria - Primary    Pt symptoms are improving but she is leaving the country Monday am and just would like to take some cipro with her in case she has a problem while she is out of the country.  We were able to dip the urine but there was not enough to culture      Relevant Medications   ciprofloxacin (CIPRO) tablet   Other Relevant Orders   POCT Urinalysis Dipstick (Completed)    Other Visit Diagnoses    Urinary urgency        Relevant Orders    POCT Urinalysis Dipstick (Completed)       I am having Ms. Biebel start on ciprofloxacin. I am also having her maintain her Loratadine, Calcium Carbonate (CALCIUM 600 PO), vitamin C, MULTIPLE VITAMIN PO, OCUVITE PRESERVISION, acetaminophen, Clocortolone Pivalate, allopurinol, amLODipine, atorvastatin, clopidogrel, enalapril, hydrochlorothiazide, pramoxine-hydrocortisone, ciprofloxacin, and clobetasol cream.  Meds ordered this encounter  Medications  . ciprofloxacin (CIPRO) 250 MG tablet    Sig: Take 1 tablet (250 mg total) by mouth 2 (two) times daily.    Dispense:  10 tablet    Refill:  0     Garnet Koyanagi, DO

## 2014-06-25 ENCOUNTER — Other Ambulatory Visit: Payer: Self-pay | Admitting: *Deleted

## 2014-06-25 DIAGNOSIS — E213 Hyperparathyroidism, unspecified: Secondary | ICD-10-CM

## 2014-08-28 ENCOUNTER — Encounter: Payer: Self-pay | Admitting: Internal Medicine

## 2014-09-09 ENCOUNTER — Ambulatory Visit (INDEPENDENT_AMBULATORY_CARE_PROVIDER_SITE_OTHER): Payer: Medicare Other | Admitting: Internal Medicine

## 2014-09-09 ENCOUNTER — Encounter: Payer: Self-pay | Admitting: Internal Medicine

## 2014-09-09 ENCOUNTER — Other Ambulatory Visit (INDEPENDENT_AMBULATORY_CARE_PROVIDER_SITE_OTHER): Payer: Medicare Other

## 2014-09-09 VITALS — BP 138/80 | HR 79 | Ht 62.0 in | Wt 184.0 lb

## 2014-09-09 DIAGNOSIS — C189 Malignant neoplasm of colon, unspecified: Secondary | ICD-10-CM | POA: Diagnosis not present

## 2014-09-09 DIAGNOSIS — I10 Essential (primary) hypertension: Secondary | ICD-10-CM

## 2014-09-09 DIAGNOSIS — E785 Hyperlipidemia, unspecified: Secondary | ICD-10-CM

## 2014-09-09 DIAGNOSIS — Z0000 Encounter for general adult medical examination without abnormal findings: Secondary | ICD-10-CM

## 2014-09-09 LAB — CBC WITH DIFFERENTIAL/PLATELET
Basophils Absolute: 0 10*3/uL (ref 0.0–0.1)
Basophils Relative: 0.8 % (ref 0.0–3.0)
Eosinophils Absolute: 0.2 10*3/uL (ref 0.0–0.7)
Eosinophils Relative: 4 % (ref 0.0–5.0)
HCT: 41.9 % (ref 36.0–46.0)
Hemoglobin: 14.1 g/dL (ref 12.0–15.0)
Lymphocytes Relative: 25.3 % (ref 12.0–46.0)
Lymphs Abs: 1.3 10*3/uL (ref 0.7–4.0)
MCHC: 33.8 g/dL (ref 30.0–36.0)
MCV: 98.5 fl (ref 78.0–100.0)
Monocytes Absolute: 0.6 10*3/uL (ref 0.1–1.0)
Monocytes Relative: 11 % (ref 3.0–12.0)
Neutro Abs: 3.1 10*3/uL (ref 1.4–7.7)
Neutrophils Relative %: 58.9 % (ref 43.0–77.0)
Platelets: 167 10*3/uL (ref 150.0–400.0)
RBC: 4.25 Mil/uL (ref 3.87–5.11)
RDW: 14.5 % (ref 11.5–15.5)
WBC: 5.3 10*3/uL (ref 4.0–10.5)

## 2014-09-09 LAB — BASIC METABOLIC PANEL
BUN: 19 mg/dL (ref 6–23)
CO2: 32 mEq/L (ref 19–32)
Calcium: 9.7 mg/dL (ref 8.4–10.5)
Chloride: 100 mEq/L (ref 96–112)
Creatinine, Ser: 0.83 mg/dL (ref 0.40–1.20)
GFR: 70.09 mL/min (ref >60.00)
Glucose, Bld: 114 mg/dL — ABNORMAL HIGH (ref 70–99)
Potassium: 4.1 mEq/L (ref 3.5–5.1)
Sodium: 140 mEq/L (ref 135–145)

## 2014-09-09 LAB — HEPATIC FUNCTION PANEL
ALT: 18 U/L (ref 0–35)
AST: 26 U/L (ref 0–37)
Albumin: 4.3 g/dL (ref 3.5–5.2)
Alkaline Phosphatase: 71 U/L (ref 39–117)
Bilirubin, Direct: 0.1 mg/dL (ref 0.0–0.3)
Total Bilirubin: 0.8 mg/dL (ref 0.2–1.2)
Total Protein: 7.4 g/dL (ref 6.0–8.3)

## 2014-09-09 LAB — URINALYSIS, ROUTINE W REFLEX MICROSCOPIC
Bilirubin Urine: NEGATIVE
Hgb urine dipstick: NEGATIVE
Ketones, ur: NEGATIVE
Nitrite: NEGATIVE
RBC / HPF: NONE SEEN (ref 0–?)
Specific Gravity, Urine: 1.01 (ref 1.000–1.030)
Total Protein, Urine: NEGATIVE
Urine Glucose: NEGATIVE
Urobilinogen, UA: 0.2 (ref 0.0–1.0)
pH: 6.5 (ref 5.0–8.0)

## 2014-09-09 LAB — LIPID PANEL
Cholesterol: 159 mg/dL (ref 0–200)
HDL: 54.9 mg/dL (ref >39.00)
NonHDL: 104.1
Total CHOL/HDL Ratio: 3
Triglycerides: 228 mg/dL — ABNORMAL HIGH (ref 0.0–149.0)
VLDL: 45.6 mg/dL — ABNORMAL HIGH (ref 0.0–40.0)

## 2014-09-09 LAB — LDL CHOLESTEROL, DIRECT: Direct LDL: 69 mg/dL

## 2014-09-09 LAB — TSH: TSH: 2.21 u[IU]/mL (ref 0.35–4.50)

## 2014-09-09 MED ORDER — CLOPIDOGREL BISULFATE 75 MG PO TABS: 75.0000 mg | ORAL_TABLET | Freq: Every day | ORAL | Status: DC

## 2014-09-09 MED ORDER — HYDROCHLOROTHIAZIDE 12.5 MG PO CAPS
12.5000 mg | ORAL_CAPSULE | Freq: Every day | ORAL | Status: DC
Start: 2014-09-09 — End: 2015-08-10

## 2014-09-09 MED ORDER — ENALAPRIL MALEATE 10 MG PO TABS: 10.0000 mg | ORAL_TABLET | Freq: Every day | ORAL | Status: DC

## 2014-09-09 MED ORDER — AMLODIPINE BESYLATE 5 MG PO TABS: 5.0000 mg | ORAL_TABLET | Freq: Every day | ORAL | Status: DC

## 2014-09-09 MED ORDER — ATORVASTATIN CALCIUM 40 MG PO TABS: 40.0000 mg | ORAL_TABLET | Freq: Every day | ORAL | Status: DC

## 2014-09-09 MED ORDER — ALLOPURINOL 100 MG PO TABS: 100.0000 mg | ORAL_TABLET | Freq: Every day | ORAL | Status: DC

## 2014-09-09 NOTE — Progress Notes (Signed)
Pre visit review using our clinic review tool, if applicable. No additional management support is needed unless otherwise documented below in the visit note. 

## 2014-09-09 NOTE — Patient Instructions (Signed)
Preventive Care for Adults A healthy lifestyle and preventive care can promote health and wellness. Preventive health guidelines for women include the following key practices.  A routine yearly physical is a good way to check with your health care provider about your health and preventive screening. It is a chance to share any concerns and updates on your health and to receive a thorough exam.  Visit your dentist for a routine exam and preventive care every 6 months. Brush your teeth twice a day and floss once a day. Good oral hygiene prevents tooth decay and gum disease.  The frequency of eye exams is based on your age, health, family medical history, use of contact lenses, and other factors. Follow your health care provider's recommendations for frequency of eye exams.  Eat a healthy diet. Foods like vegetables, fruits, whole grains, low-fat dairy products, and lean protein foods contain the nutrients you need without too many calories. Decrease your intake of foods high in solid fats, added sugars, and salt. Eat the right amount of calories for you.Get information about a proper diet from your health care provider, if necessary.  Regular physical exercise is one of the most important things you can do for your health. Most adults should get at least 150 minutes of moderate-intensity exercise (any activity that increases your heart rate and causes you to sweat) each week. In addition, most adults need muscle-strengthening exercises on 2 or more days a week.  Maintain a healthy weight. The body mass index (BMI) is a screening tool to identify possible weight problems. It provides an estimate of body fat based on height and weight. Your health care provider can find your BMI and can help you achieve or maintain a healthy weight.For adults 20 years and older:  A BMI below 18.5 is considered underweight.  A BMI of 18.5 to 24.9 is normal.  A BMI of 25 to 29.9 is considered overweight.  A BMI of  30 and above is considered obese.  Maintain normal blood lipids and cholesterol levels by exercising and minimizing your intake of saturated fat. Eat a balanced diet with plenty of fruit and vegetables. Blood tests for lipids and cholesterol should begin at age 76 and be repeated every 5 years. If your lipid or cholesterol levels are high, you are over 50, or you are at high risk for heart disease, you may need your cholesterol levels checked more frequently.Ongoing high lipid and cholesterol levels should be treated with medicines if diet and exercise are not working.  If you smoke, find out from your health care provider how to quit. If you do not use tobacco, do not start.  Lung cancer screening is recommended for adults aged 22-80 years who are at high risk for developing lung cancer because of a history of smoking. A yearly low-dose CT scan of the lungs is recommended for people who have at least a 30-pack-year history of smoking and are a current smoker or have quit within the past 15 years. A pack year of smoking is smoking an average of 1 pack of cigarettes a day for 1 year (for example: 1 pack a day for 30 years or 2 packs a day for 15 years). Yearly screening should continue until the smoker has stopped smoking for at least 15 years. Yearly screening should be stopped for people who develop a health problem that would prevent them from having lung cancer treatment.  If you are pregnant, do not drink alcohol. If you are breastfeeding,  be very cautious about drinking alcohol. If you are not pregnant and choose to drink alcohol, do not have more than 1 drink per day. One drink is considered to be 12 ounces (355 mL) of beer, 5 ounces (148 mL) of wine, or 1.5 ounces (44 mL) of liquor.  Avoid use of street drugs. Do not share needles with anyone. Ask for help if you need support or instructions about stopping the use of drugs.  High blood pressure causes heart disease and increases the risk of  stroke. Your blood pressure should be checked at least every 1 to 2 years. Ongoing high blood pressure should be treated with medicines if weight loss and exercise do not work.  If you are 3-86 years old, ask your health care provider if you should take aspirin to prevent strokes.  Diabetes screening involves taking a blood sample to check your fasting blood sugar level. This should be done once every 3 years, after age 67, if you are within normal weight and without risk factors for diabetes. Testing should be considered at a younger age or be carried out more frequently if you are overweight and have at least 1 risk factor for diabetes.  Breast cancer screening is essential preventive care for women. You should practice "breast self-awareness." This means understanding the normal appearance and feel of your breasts and may include breast self-examination. Any changes detected, no matter how small, should be reported to a health care provider. Women in their 8s and 30s should have a clinical breast exam (CBE) by a health care provider as part of a regular health exam every 1 to 3 years. After age 70, women should have a CBE every year. Starting at age 25, women should consider having a mammogram (breast X-ray test) every year. Women who have a family history of breast cancer should talk to their health care provider about genetic screening. Women at a high risk of breast cancer should talk to their health care providers about having an MRI and a mammogram every year.  Breast cancer gene (BRCA)-related cancer risk assessment is recommended for women who have family members with BRCA-related cancers. BRCA-related cancers include breast, ovarian, tubal, and peritoneal cancers. Having family members with these cancers may be associated with an increased risk for harmful changes (mutations) in the breast cancer genes BRCA1 and BRCA2. Results of the assessment will determine the need for genetic counseling and  BRCA1 and BRCA2 testing.  Routine pelvic exams to screen for cancer are no longer recommended for nonpregnant women who are considered low risk for cancer of the pelvic organs (ovaries, uterus, and vagina) and who do not have symptoms. Ask your health care provider if a screening pelvic exam is right for you.  If you have had past treatment for cervical cancer or a condition that could lead to cancer, you need Pap tests and screening for cancer for at least 20 years after your treatment. If Pap tests have been discontinued, your risk factors (such as having a new sexual partner) need to be reassessed to determine if screening should be resumed. Some women have medical problems that increase the chance of getting cervical cancer. In these cases, your health care provider may recommend more frequent screening and Pap tests.  The HPV test is an additional test that may be used for cervical cancer screening. The HPV test looks for the virus that can cause the cell changes on the cervix. The cells collected during the Pap test can be  tested for HPV. The HPV test could be used to screen women aged 30 years and older, and should be used in women of any age who have unclear Pap test results. After the age of 30, women should have HPV testing at the same frequency as a Pap test.  Colorectal cancer can be detected and often prevented. Most routine colorectal cancer screening begins at the age of 50 years and continues through age 75 years. However, your health care provider may recommend screening at an earlier age if you have risk factors for colon cancer. On a yearly basis, your health care provider may provide home test kits to check for hidden blood in the stool. Use of a small camera at the end of a tube, to directly examine the colon (sigmoidoscopy or colonoscopy), can detect the earliest forms of colorectal cancer. Talk to your health care provider about this at age 50, when routine screening begins. Direct  exam of the colon should be repeated every 5-10 years through age 75 years, unless early forms of pre-cancerous polyps or small growths are found.  People who are at an increased risk for hepatitis B should be screened for this virus. You are considered at high risk for hepatitis B if:  You were born in a country where hepatitis B occurs often. Talk with your health care provider about which countries are considered high risk.  Your parents were born in a high-risk country and you have not received a shot to protect against hepatitis B (hepatitis B vaccine).  You have HIV or AIDS.  You use needles to inject street drugs.  You live with, or have sex with, someone who has hepatitis B.  You get hemodialysis treatment.  You take certain medicines for conditions like cancer, organ transplantation, and autoimmune conditions.  Hepatitis C blood testing is recommended for all people born from 1945 through 1965 and any individual with known risks for hepatitis C.  Practice safe sex. Use condoms and avoid high-risk sexual practices to reduce the spread of sexually transmitted infections (STIs). STIs include gonorrhea, chlamydia, syphilis, trichomonas, herpes, HPV, and human immunodeficiency virus (HIV). Herpes, HIV, and HPV are viral illnesses that have no cure. They can result in disability, cancer, and death.  You should be screened for sexually transmitted illnesses (STIs) including gonorrhea and chlamydia if:  You are sexually active and are younger than 24 years.  You are older than 24 years and your health care provider tells you that you are at risk for this type of infection.  Your sexual activity has changed since you were last screened and you are at an increased risk for chlamydia or gonorrhea. Ask your health care provider if you are at risk.  If you are at risk of being infected with HIV, it is recommended that you take a prescription medicine daily to prevent HIV infection. This is  called preexposure prophylaxis (PrEP). You are considered at risk if:  You are a heterosexual woman, are sexually active, and are at increased risk for HIV infection.  You take drugs by injection.  You are sexually active with a partner who has HIV.  Talk with your health care provider about whether you are at high risk of being infected with HIV. If you choose to begin PrEP, you should first be tested for HIV. You should then be tested every 3 months for as long as you are taking PrEP.  Osteoporosis is a disease in which the bones lose minerals and strength   with aging. This can result in serious bone fractures or breaks. The risk of osteoporosis can be identified using a bone density scan. Women ages 65 years and over and women at risk for fractures or osteoporosis should discuss screening with their health care providers. Ask your health care provider whether you should take a calcium supplement or vitamin D to reduce the rate of osteoporosis.  Menopause can be associated with physical symptoms and risks. Hormone replacement therapy is available to decrease symptoms and risks. You should talk to your health care provider about whether hormone replacement therapy is right for you.  Use sunscreen. Apply sunscreen liberally and repeatedly throughout the day. You should seek shade when your shadow is shorter than you. Protect yourself by wearing long sleeves, pants, a wide-brimmed hat, and sunglasses year round, whenever you are outdoors.  Once a month, do a whole body skin exam, using a mirror to look at the skin on your back. Tell your health care provider of new moles, moles that have irregular borders, moles that are larger than a pencil eraser, or moles that have changed in shape or color.  Stay current with required vaccines (immunizations).  Influenza vaccine. All adults should be immunized every year.  Tetanus, diphtheria, and acellular pertussis (Td, Tdap) vaccine. Pregnant women should  receive 1 dose of Tdap vaccine during each pregnancy. The dose should be obtained regardless of the length of time since the last dose. Immunization is preferred during the 27th-36th week of gestation. An adult who has not previously received Tdap or who does not know her vaccine status should receive 1 dose of Tdap. This initial dose should be followed by tetanus and diphtheria toxoids (Td) booster doses every 10 years. Adults with an unknown or incomplete history of completing a 3-dose immunization series with Td-containing vaccines should begin or complete a primary immunization series including a Tdap dose. Adults should receive a Td booster every 10 years.  Varicella vaccine. An adult without evidence of immunity to varicella should receive 2 doses or a second dose if she has previously received 1 dose. Pregnant females who do not have evidence of immunity should receive the first dose after pregnancy. This first dose should be obtained before leaving the health care facility. The second dose should be obtained 4-8 weeks after the first dose.  Human papillomavirus (HPV) vaccine. Females aged 13-26 years who have not received the vaccine previously should obtain the 3-dose series. The vaccine is not recommended for use in pregnant females. However, pregnancy testing is not needed before receiving a dose. If a female is found to be pregnant after receiving a dose, no treatment is needed. In that case, the remaining doses should be delayed until after the pregnancy. Immunization is recommended for any person with an immunocompromised condition through the age of 26 years if she did not get any or all doses earlier. During the 3-dose series, the second dose should be obtained 4-8 weeks after the first dose. The third dose should be obtained 24 weeks after the first dose and 16 weeks after the second dose.  Zoster vaccine. One dose is recommended for adults aged 60 years or older unless certain conditions are  present.  Measles, mumps, and rubella (MMR) vaccine. Adults born before 1957 generally are considered immune to measles and mumps. Adults born in 1957 or later should have 1 or more doses of MMR vaccine unless there is a contraindication to the vaccine or there is laboratory evidence of immunity to   each of the three diseases. A routine second dose of MMR vaccine should be obtained at least 28 days after the first dose for students attending postsecondary schools, health care workers, or international travelers. People who received inactivated measles vaccine or an unknown type of measles vaccine during 1963-1967 should receive 2 doses of MMR vaccine. People who received inactivated mumps vaccine or an unknown type of mumps vaccine before 1979 and are at high risk for mumps infection should consider immunization with 2 doses of MMR vaccine. For females of childbearing age, rubella immunity should be determined. If there is no evidence of immunity, females who are not pregnant should be vaccinated. If there is no evidence of immunity, females who are pregnant should delay immunization until after pregnancy. Unvaccinated health care workers born before 1957 who lack laboratory evidence of measles, mumps, or rubella immunity or laboratory confirmation of disease should consider measles and mumps immunization with 2 doses of MMR vaccine or rubella immunization with 1 dose of MMR vaccine.  Pneumococcal 13-valent conjugate (PCV13) vaccine. When indicated, a person who is uncertain of her immunization history and has no record of immunization should receive the PCV13 vaccine. An adult aged 19 years or older who has certain medical conditions and has not been previously immunized should receive 1 dose of PCV13 vaccine. This PCV13 should be followed with a dose of pneumococcal polysaccharide (PPSV23) vaccine. The PPSV23 vaccine dose should be obtained at least 8 weeks after the dose of PCV13 vaccine. An adult aged 19  years or older who has certain medical conditions and previously received 1 or more doses of PPSV23 vaccine should receive 1 dose of PCV13. The PCV13 vaccine dose should be obtained 1 or more years after the last PPSV23 vaccine dose.  Pneumococcal polysaccharide (PPSV23) vaccine. When PCV13 is also indicated, PCV13 should be obtained first. All adults aged 65 years and older should be immunized. An adult younger than age 65 years who has certain medical conditions should be immunized. Any person who resides in a nursing home or long-term care facility should be immunized. An adult smoker should be immunized. People with an immunocompromised condition and certain other conditions should receive both PCV13 and PPSV23 vaccines. People with human immunodeficiency virus (HIV) infection should be immunized as soon as possible after diagnosis. Immunization during chemotherapy or radiation therapy should be avoided. Routine use of PPSV23 vaccine is not recommended for American Indians, [place] Natives, or people younger than 65 years unless there are medical conditions that require PPSV23 vaccine. When indicated, people who have unknown immunization and have no record of immunization should receive PPSV23 vaccine. One-time revaccination 5 years after the first dose of PPSV23 is recommended for people aged 19-64 years who have chronic kidney failure, nephrotic syndrome, asplenia, or immunocompromised conditions. People who received 1-2 doses of PPSV23 before age 65 years should receive another dose of PPSV23 vaccine at age 65 years or later if at least 5 years have passed since the previous dose. Doses of PPSV23 are not needed for people immunized with PPSV23 at or after age 65 years.  Meningococcal vaccine. Adults with asplenia or persistent complement component deficiencies should receive 2 doses of quadrivalent meningococcal conjugate (MenACWY-D) vaccine. The doses should be obtained at least 2 months apart.  Microbiologists working with certain meningococcal bacteria, military recruits, people at risk during an outbreak, and people who travel to or live in countries with a high rate of meningitis should be immunized. A first-year college student up through age   21 years who is living in a residence hall should receive a dose if she did not receive a dose on or after her 16th birthday. Adults who have certain high-risk conditions should receive one or more doses of vaccine.  Hepatitis A vaccine. Adults who wish to be protected from this disease, have certain high-risk conditions, work with hepatitis A-infected animals, work in hepatitis A research labs, or travel to or work in countries with a high rate of hepatitis A should be immunized. Adults who were previously unvaccinated and who anticipate close contact with an international adoptee during the first 60 days after arrival in the [place] from a country with a high rate of hepatitis A should be immunized.  Hepatitis B vaccine. Adults who wish to be protected from this disease, have certain high-risk conditions, may be exposed to blood or other infectious body fluids, are household contacts or sex partners of hepatitis B positive people, are clients or workers in certain care facilities, or travel to or work in countries with a high rate of hepatitis B should be immunized.  Haemophilus influenzae type b (Hib) vaccine. A previously unvaccinated person with asplenia or sickle cell disease or having a scheduled splenectomy should receive 1 dose of Hib vaccine. Regardless of previous immunization, a recipient of a hematopoietic stem cell transplant should receive a 3-dose series 6-12 months after her successful transplant. Hib vaccine is not recommended for adults with HIV infection. Preventive Services / Frequency Ages 64 to 68 years  Blood pressure check.** / Every 1 to 2 years.  Lipid and cholesterol check.** / Every 5 years beginning at age  22.  Clinical breast exam.** / Every 3 years for women in their 88s and 53s.  BRCA-related cancer risk assessment.** / For women who have family members with a BRCA-related cancer (breast, ovarian, tubal, or peritoneal cancers).  Pap test.** / Every 2 years from ages 90 through 51. Every 3 years starting at age 21 through age 56 or 3 with a history of 3 consecutive normal Pap tests.  HPV screening.** / Every 3 years from ages 24 through ages 1 to 46 with a history of 3 consecutive normal Pap tests.  Hepatitis C blood test.** / For any individual with known risks for hepatitis C.  Skin self-exam. / Monthly.  Influenza vaccine. / Every year.  Tetanus, diphtheria, and acellular pertussis (Tdap, Td) vaccine.** / Consult your health care provider. Pregnant women should receive 1 dose of Tdap vaccine during each pregnancy. 1 dose of Td every 10 years.  Varicella vaccine.** / Consult your health care provider. Pregnant females who do not have evidence of immunity should receive the first dose after pregnancy.  HPV vaccine. / 3 doses over 6 months, if 72 and younger. The vaccine is not recommended for use in pregnant females. However, pregnancy testing is not needed before receiving a dose.  Measles, mumps, rubella (MMR) vaccine.** / You need at least 1 dose of MMR if you were born in 1957 or later. You may also need a 2nd dose. For females of childbearing age, rubella immunity should be determined. If there is no evidence of immunity, females who are not pregnant should be vaccinated. If there is no evidence of immunity, females who are pregnant should delay immunization until after pregnancy.  Pneumococcal 13-valent conjugate (PCV13) vaccine.** / Consult your health care provider.  Pneumococcal polysaccharide (PPSV23) vaccine.** / 1 to 2 doses if you smoke cigarettes or if you have certain conditions.  Meningococcal vaccine.** /  1 dose if you are age 19 to 21 years and a first-year college  student living in a residence hall, or have one of several medical conditions, you need to get vaccinated against meningococcal disease. You may also need additional booster doses.  Hepatitis A vaccine.** / Consult your health care provider.  Hepatitis B vaccine.** / Consult your health care provider.  Haemophilus influenzae type b (Hib) vaccine.** / Consult your health care provider. Ages 40 to 64 years  Blood pressure check.** / Every 1 to 2 years.  Lipid and cholesterol check.** / Every 5 years beginning at age 20 years.  Lung cancer screening. / Every year if you are aged 55-80 years and have a 30-pack-year history of smoking and currently smoke or have quit within the past 15 years. Yearly screening is stopped once you have quit smoking for at least 15 years or develop a health problem that would prevent you from having lung cancer treatment.  Clinical breast exam.** / Every year after age 40 years.  BRCA-related cancer risk assessment.** / For women who have family members with a BRCA-related cancer (breast, ovarian, tubal, or peritoneal cancers).  Mammogram.** / Every year beginning at age 40 years and continuing for as long as you are in good health. Consult with your health care provider.  Pap test.** / Every 3 years starting at age 30 years through age 65 or 70 years with a history of 3 consecutive normal Pap tests.  HPV screening.** / Every 3 years from ages 30 years through ages 65 to 70 years with a history of 3 consecutive normal Pap tests.  Fecal occult blood test (FOBT) of stool. / Every year beginning at age 50 years and continuing until age 75 years. You may not need to do this test if you get a colonoscopy every 10 years.  Flexible sigmoidoscopy or colonoscopy.** / Every 5 years for a flexible sigmoidoscopy or every 10 years for a colonoscopy beginning at age 50 years and continuing until age 75 years.  Hepatitis C blood test.** / For all people born from 1945 through  1965 and any individual with known risks for hepatitis C.  Skin self-exam. / Monthly.  Influenza vaccine. / Every year.  Tetanus, diphtheria, and acellular pertussis (Tdap/Td) vaccine.** / Consult your health care provider. Pregnant women should receive 1 dose of Tdap vaccine during each pregnancy. 1 dose of Td every 10 years.  Varicella vaccine.** / Consult your health care provider. Pregnant females who do not have evidence of immunity should receive the first dose after pregnancy.  Zoster vaccine.** / 1 dose for adults aged 60 years or older.  Measles, mumps, rubella (MMR) vaccine.** / You need at least 1 dose of MMR if you were born in 1957 or later. You may also need a 2nd dose. For females of childbearing age, rubella immunity should be determined. If there is no evidence of immunity, females who are not pregnant should be vaccinated. If there is no evidence of immunity, females who are pregnant should delay immunization until after pregnancy.  Pneumococcal 13-valent conjugate (PCV13) vaccine.** / Consult your health care provider.  Pneumococcal polysaccharide (PPSV23) vaccine.** / 1 to 2 doses if you smoke cigarettes or if you have certain conditions.  Meningococcal vaccine.** / Consult your health care provider.  Hepatitis A vaccine.** / Consult your health care provider.  Hepatitis B vaccine.** / Consult your health care provider.  Haemophilus influenzae type b (Hib) vaccine.** / Consult your health care provider. Ages 65   years and over  Blood pressure check.** / Every 1 to 2 years.  Lipid and cholesterol check.** / Every 5 years beginning at age 22 years.  Lung cancer screening. / Every year if you are aged 73-80 years and have a 30-pack-year history of smoking and currently smoke or have quit within the past 15 years. Yearly screening is stopped once you have quit smoking for at least 15 years or develop a health problem that would prevent you from having lung cancer  treatment.  Clinical breast exam.** / Every year after age 4 years.  BRCA-related cancer risk assessment.** / For women who have family members with a BRCA-related cancer (breast, ovarian, tubal, or peritoneal cancers).  Mammogram.** / Every year beginning at age 40 years and continuing for as long as you are in good health. Consult with your health care provider.  Pap test.** / Every 3 years starting at age 9 years through age 34 or 91 years with 3 consecutive normal Pap tests. Testing can be stopped between 65 and 70 years with 3 consecutive normal Pap tests and no abnormal Pap or HPV tests in the past 10 years.  HPV screening.** / Every 3 years from ages 57 years through ages 64 or 45 years with a history of 3 consecutive normal Pap tests. Testing can be stopped between 65 and 70 years with 3 consecutive normal Pap tests and no abnormal Pap or HPV tests in the past 10 years.  Fecal occult blood test (FOBT) of stool. / Every year beginning at age 15 years and continuing until age 17 years. You may not need to do this test if you get a colonoscopy every 10 years.  Flexible sigmoidoscopy or colonoscopy.** / Every 5 years for a flexible sigmoidoscopy or every 10 years for a colonoscopy beginning at age 86 years and continuing until age 71 years.  Hepatitis C blood test.** / For all people born from [birth year] through 1963 and any individual with known risks for hepatitis C.  Osteoporosis screening.** / A one-time screening for women ages 83 years and over and women at risk for fractures or osteoporosis.  Skin self-exam. / Monthly.  Influenza vaccine. / Every year.  Tetanus, diphtheria, and acellular pertussis (Tdap/Td) vaccine.** / 1 dose of Td every 10 years.  Varicella vaccine.** / Consult your health care provider.  Zoster vaccine.** / 1 dose for adults aged 61 years or older.  Pneumococcal 13-valent conjugate (PCV13) vaccine.** / Consult your health care provider.  Pneumococcal  polysaccharide (PPSV23) vaccine.** / 1 dose for all adults aged 28 years and older.  Meningococcal vaccine.** / Consult your health care provider.  Hepatitis A vaccine.** / Consult your health care provider.  Hepatitis B vaccine.** / Consult your health care provider.  Haemophilus influenzae type b (Hib) vaccine.** / Consult your health care provider. ** Family history and personal history of risk and conditions may change your health care provider's recommendations. Document Released: 03/29/2001 Document Revised: 06/17/2013 Document Reviewed: 06/28/2010 [facility] Patient Information 2015 [place], [place]. This information is not intended to replace advice given to you by your health care provider. Make sure you discuss any questions you have with your health care provider.

## 2014-09-09 NOTE — Assessment & Plan Note (Signed)
Norvasc, Enalapril, HCTZ 

## 2014-09-09 NOTE — Assessment & Plan Note (Signed)
Here for medicare wellness/physical  Diet: heart healthy  Physical activity: sedentary  Depression/mood screen: negative  Hearing: intact to whispered voice  Visual acuity: poorl, performs annual eye exam  ADLs: capable  Fall risk: moderate  Home safety: good  Cognitive evaluation: intact to orientation, naming, recall and repetition  EOL planning: adv directives, full code/ I agree  I have personally reviewed and have noted  1. The patient's medical, surgical and social history  2. Their use of alcohol, tobacco or illicit drugs  3. Their current medications and supplements  4. The patient's functional ability including ADL's, fall risks, home safety risks and hearing or visual impairment.  5. Diet and physical activities  6. Evidence for depression or mood disorders 7. The roster of all physicians providing medical care to patient - is listed in the Snapshot section of the chart and reviewed today.    Today patient counseled on age appropriate routine health concerns for screening and prevention, each reviewed and up to date or declined. Immunizations reviewed and up to date or declined. Labs ordered and reviewed. Risk factors for depression reviewed and negative. Hearing function and visual acuity are intact. ADLs screened and addressed as needed. Functional ability and level of safety reviewed and appropriate. Education, counseling and referrals performed based on assessed risks today. Patient provided with a copy of personalized plan for preventive services.

## 2014-09-09 NOTE — Assessment & Plan Note (Signed)
  Atorvastatin 40 mg

## 2014-09-09 NOTE — Progress Notes (Signed)
Subjective:  Patient ID: Gina [name], female    DOB: 04-25-1931  Age: 81 y.o. MRN: [id number]  CC: No chief complaint on file.   HPI Gina [name] presents for a well visit. F/u colon ca, HTN, OA. She has macular degeneration - not driving... Doing chair exercises  Outpatient Prescriptions Prior to Visit  Medication Sig Dispense Refill  . acetaminophen (TYLENOL) 500 MG tablet Take 500 mg by mouth every 6 (six) hours as needed. For pain    . allopurinol (ZYLOPRIM) 100 MG tablet Take 1 tablet (100 mg total) by mouth daily. 90 tablet 3  . amLODipine (NORVASC) 5 MG tablet Take 1 tablet (5 mg total) by mouth daily. 90 tablet 3  . Ascorbic Acid (VITAMIN C) 100 MG tablet Take 500 mg by mouth daily.     [name] atorvastatin (LIPITOR) 40 MG tablet Take 1 tablet (40 mg total) by mouth daily. 90 tablet 3  . Calcium Carbonate (CALCIUM 600 PO) Take 600 mg by mouth 2 (two) times daily.     . clobetasol cream (TEMOVATE) 0.05 % Use as directed  3  . Clocortolone Pivalate (CLODERM) 0.1 % cream Apply 1 application topically daily.    . clopidogrel (PLAVIX) 75 MG tablet Take 1 tablet (75 mg total) by mouth daily. 90 tablet 3  . enalapril (VASOTEC) 10 MG tablet Take 1 tablet (10 mg total) by mouth daily. 90 tablet 3  . hydrochlorothiazide (MICROZIDE) 12.5 MG capsule Take 1 capsule (12.5 mg total) by mouth daily. 90 capsule 3  . Loratadine 10 MG CAPS Take 10 mg by mouth daily.     . MULTIPLE VITAMIN PO Take 1 tablet by mouth daily.     . Multiple Vitamins-Minerals (OCUVITE PRESERVISION) TABS Take 1 tablet by mouth 2 (two) times daily.     . pramoxine-hydrocortisone (PROCTOCREAM-HC) 1-1 % rectal cream Place 1 application rectally 2 (two) times daily. 30 g 1  . ciprofloxacin (CIPRO) 250 MG tablet Take 1 tablet (250 mg total) by mouth 2 (two) times daily. (Patient not taking: Reported on 09/09/2014) 10 tablet 0  . ciprofloxacin (CIPRO) 500 MG tablet Take 1 tablet (500 mg total) by mouth 2 (two) times daily.  (Patient not taking: Reported on 09/09/2014) 20 tablet 0   No facility-administered medications prior to visit.    ROS Review of Systems  Constitutional: Negative for chills, activity change, appetite change, fatigue and unexpected weight change.  HENT: Negative for congestion, ear pain, mouth sores, nosebleeds, sinus pressure and voice change.   Eyes: Positive for visual disturbance.  Respiratory: Negative for cough and chest tightness.   Gastrointestinal: Negative for nausea and abdominal pain.  Genitourinary: Negative for frequency, difficulty urinating and vaginal pain.  Musculoskeletal: Negative for back pain and gait problem.  Skin: Negative for pallor and rash.  Neurological: Negative for dizziness, tremors, weakness, numbness and headaches.  Psychiatric/Behavioral: Negative for suicidal ideas, confusion and sleep disturbance. The patient is not nervous/anxious.     Objective:  BP 138/80 mmHg  Pulse 79  Ht '5\' 2"'$  (1.575 m)  Wt 184 lb (83.462 kg)  BMI 33.65 kg/m2  SpO2 95%  BP Readings from Last 3 Encounters:  09/09/14 138/80  05/17/14 124/80  05/08/14 144/63    Wt Readings from Last 3 Encounters:  09/09/14 184 lb (83.462 kg)  05/17/14 186 lb 8 oz (84.596 kg)  05/08/14 187 lb 11.2 oz (85.14 kg)    Physical Exam  Constitutional: She appears well-developed. No distress.  Obese  HENT:  Head: Normocephalic.  Right Ear: External ear normal.  Left Ear: External ear normal.  Nose: Nose normal.  Mouth/Throat: Oropharynx is clear and moist.  Eyes: Conjunctivae are normal. Right eye exhibits no discharge. Left eye exhibits no discharge.  Neck: Normal range of motion. Neck supple. No JVD present. No tracheal deviation present. No thyromegaly present.  Cardiovascular: Normal rate, regular rhythm and normal heart sounds.   Pulmonary/Chest: No stridor. No respiratory distress. She has no wheezes.  Abdominal: Soft. Bowel sounds are normal. She exhibits no distension and no  mass. There is no tenderness. There is no rebound and no guarding.  Musculoskeletal: She exhibits no edema or tenderness.  Lymphadenopathy:    She has no cervical adenopathy.  Neurological: She displays normal reflexes. No cranial nerve deficit. She exhibits normal muscle tone. Coordination normal.  Skin: No rash noted. No erythema.  Psychiatric: She has a normal mood and affect. Her behavior is normal. Judgment and thought content normal.    Lab Results  Component Value Date   WBC 5.6 05/06/2014   HGB 14.2 05/06/2014   HCT 44.0 05/06/2014   PLT 216 05/06/2014   GLUCOSE 104 05/06/2014   CHOL 162 03/18/2014   TRIG 279.0* 03/18/2014   HDL 62.00 03/18/2014   LDLDIRECT 65.0 03/18/2014   LDLCALC 67 12/03/2009   ALT 21 05/06/2014   AST 26 05/06/2014   NA 141 05/06/2014   K 4.2 05/06/2014   CL 103 03/18/2014   CREATININE 0.8 05/06/2014   BUN 23.7 05/06/2014   CO2 26 05/06/2014   TSH 2.01 03/18/2014   INR 0.94 06/07/2010   HGBA1C * 11/12/2009    5.7 (NOTE)                                                                       According to the ADA Clinical Practice Recommendations for 2011, when HbA1c is used as a screening test:   >=6.5%   Diagnostic of Diabetes Mellitus           (if abnormal result  is confirmed)  5.7-6.4%   Increased risk of developing Diabetes Mellitus  References:Diagnosis and Classification of Diabetes Mellitus,Diabetes MCNO,7096,28(ZMOQH 1):S62-S69 and Standards of Medical Care in         Diabetes - 2011,Diabetes Care,2011,34  (Suppl 1):S11-S61.    Ct Abdomen Pelvis W Contrast  05/06/2014   CLINICAL DATA:  Colon cancer restaging. Diagnosed 12/12. Surgery only. Hypertension. Hyperlipidemia.  EXAM: CT ABDOMEN AND PELVIS WITH CONTRAST  TECHNIQUE: Multidetector CT imaging of the abdomen and pelvis was performed using the standard protocol following bolus administration of intravenous contrast.  CONTRAST:  17m OMNIPAQUE IOHEXOL 300 MG/ML  SOLN  COMPARISON:   06/11/2013  FINDINGS: Lower chest: Lingular volume loss and scarring. No change in a vague 5 mm left lower lobe subpleural pulmonary nodule, benign. Mild cardiomegaly with coronary artery atherosclerosis. No pericardial or pleural effusion.  Hepatobiliary: Mild hepatic steatosis, without focal liver lesion. Normal gallbladder, without biliary ductal dilatation.  Pancreas: Normal, without mass or ductal dilatation.  Spleen: Normal  Adrenals/Urinary Tract: Normal adrenal glands. Bilateral too small to characterize renal lesions. Interpolar right renal cyst. 3 mm left renal collecting system calculus. No hydronephrosis. Pelvic floor laxity. Mild cystocele.  Stomach/Bowel: Normal stomach, without wall thickening. Extensive colonic diverticulosis. Partial right hemicolectomy. Normal small bowel.  Vascular/Lymphatic: Advanced aortic and branch vessel atherosclerosis. No abdominopelvic adenopathy.  Reproductive: Normal appearance of the uterus, other than pelvic floor laxity. No adnexal mass.  Other: No significant free fluid. No evidence of omental or peritoneal disease. Areas of anterior abdominal and pelvic wall laxity containing fat. The abdominal wall laxity contains nonobstructive transverse colon.  Musculoskeletal: Bone island in the right ischium. Degenerative disc disease at L4-5 and L5-S1. Trace L4-5 anterolisthesis is likely degenerative.  IMPRESSION: 1. Partial right hemicolectomy, without recurrent or metastatic disease. 2. Mild hepatic steatosis. 3. Pelvic floor laxity with a cystocele.   Electronically Signed   By: Abigail Miyamoto M.D.   On: 05/06/2014 14:52    Assessment & Plan:   Diagnoses and all orders for this visit:  Colon cancer Orders: -     clopidogrel (PLAVIX) 75 MG tablet; Take 1 tablet (75 mg total) by mouth daily.  Other orders -     allopurinol (ZYLOPRIM) 100 MG tablet; Take 1 tablet (100 mg total) by mouth daily. -     amLODipine (NORVASC) 5 MG tablet; Take 1 tablet (5 mg total) by  mouth daily. -     atorvastatin (LIPITOR) 40 MG tablet; Take 1 tablet (40 mg total) by mouth daily. -     enalapril (VASOTEC) 10 MG tablet; Take 1 tablet (10 mg total) by mouth daily. -     hydrochlorothiazide (MICROZIDE) 12.5 MG capsule; Take 1 capsule (12.5 mg total) by mouth daily.   I am having Ms. Boot maintain her Loratadine, Calcium Carbonate (CALCIUM 600 PO), vitamin C, MULTIPLE VITAMIN PO, OCUVITE PRESERVISION, acetaminophen, Clocortolone Pivalate, allopurinol, amLODipine, atorvastatin, clopidogrel, enalapril, hydrochlorothiazide, pramoxine-hydrocortisone, ciprofloxacin, clobetasol cream, and ciprofloxacin.  No orders of the defined types were placed in this encounter.     Follow-up: No Follow-up on file.  Walker Kehr, MD

## 2014-10-15 DIAGNOSIS — H26492 Other secondary cataract, left eye: Secondary | ICD-10-CM | POA: Diagnosis not present

## 2014-10-15 DIAGNOSIS — Z961 Presence of intraocular lens: Secondary | ICD-10-CM | POA: Diagnosis not present

## 2014-10-15 DIAGNOSIS — H10413 Chronic giant papillary conjunctivitis, bilateral: Secondary | ICD-10-CM | POA: Diagnosis not present

## 2014-10-15 DIAGNOSIS — H3531 Nonexudative age-related macular degeneration: Secondary | ICD-10-CM | POA: Diagnosis not present

## 2014-11-06 ENCOUNTER — Telehealth: Payer: Self-pay | Admitting: Oncology

## 2014-11-06 ENCOUNTER — Ambulatory Visit (HOSPITAL_BASED_OUTPATIENT_CLINIC_OR_DEPARTMENT_OTHER): Payer: Medicare Other | Admitting: Oncology

## 2014-11-06 ENCOUNTER — Other Ambulatory Visit (HOSPITAL_BASED_OUTPATIENT_CLINIC_OR_DEPARTMENT_OTHER): Payer: Medicare Other

## 2014-11-06 VITALS — BP 168/60 | HR 82 | Temp 98.5°F | Resp 18 | Ht 62.0 in | Wt 187.8 lb

## 2014-11-06 DIAGNOSIS — C182 Malignant neoplasm of ascending colon: Secondary | ICD-10-CM | POA: Diagnosis not present

## 2014-11-06 LAB — COMPREHENSIVE METABOLIC PANEL (CC13)
ALT: 20 U/L (ref 0–55)
AST: 25 U/L (ref 5–34)
Albumin: 3.8 g/dL (ref 3.5–5.0)
Alkaline Phosphatase: 74 U/L (ref 40–150)
Anion Gap: 9 mEq/L (ref 3–11)
BUN: 22.1 mg/dL (ref 7.0–26.0)
CO2: 31 mEq/L — ABNORMAL HIGH (ref 22–29)
Calcium: 9.3 mg/dL (ref 8.4–10.4)
Chloride: 103 mEq/L (ref 98–109)
Creatinine: 0.8 mg/dL (ref 0.6–1.1)
EGFR: 65 mL/min/{1.73_m2} — ABNORMAL LOW (ref >90)
Glucose: 98 mg/dl (ref 70–140)
Potassium: 3.9 mEq/L (ref 3.5–5.1)
Sodium: 143 mEq/L (ref 136–145)
Total Bilirubin: 0.59 mg/dL (ref 0.20–1.20)
Total Protein: 6.6 g/dL (ref 6.4–8.3)

## 2014-11-06 LAB — CBC WITH DIFFERENTIAL/PLATELET
BASO%: 0.9 % (ref 0.0–2.0)
Basophils Absolute: 0 10*3/uL (ref 0.0–0.1)
EOS%: 2.8 % (ref 0.0–7.0)
Eosinophils Absolute: 0.1 10*3/uL (ref 0.0–0.5)
HCT: 40.6 % (ref 34.8–46.6)
HGB: 13.5 g/dL (ref 11.6–15.9)
LYMPH%: 22.9 % (ref 14.0–49.7)
MCH: 33.1 pg (ref 25.1–34.0)
MCHC: 33.3 g/dL (ref 31.5–36.0)
MCV: 99.5 fL (ref 79.5–101.0)
MONO#: 0.5 10*3/uL (ref 0.1–0.9)
MONO%: 11.3 % (ref 0.0–14.0)
NEUT#: 2.9 10*3/uL (ref 1.5–6.5)
NEUT%: 62.1 % (ref 38.4–76.8)
Platelets: 202 10*3/uL (ref 145–400)
RBC: 4.08 10*6/uL (ref 3.70–5.45)
RDW: 14 % (ref 11.2–14.5)
WBC: 4.7 10*3/uL (ref 3.9–10.3)
lymph#: 1.1 10*3/uL (ref 0.9–3.3)

## 2014-11-06 LAB — CEA: CEA: 1.7 ng/mL (ref 0.0–5.0)

## 2014-11-06 NOTE — Progress Notes (Signed)
Hematology and Oncology Follow Up Visit  Gina [name] [id number] Apr 16, 1931 81 y.o. 11/06/2014 10:37 AM    Principle Diagnosis: 81 year old woman T3 N0 (0 of 19 lymph nodes) poorly-differentiated adenocarcinoma of the ascending colon. Diagnosed in 01/2011.   Prior Therapy: Status post laparoscopic-assisted right hemicolectomy on 01/21/2011.  Current therapy: Observation and follow up.   Interim History:  Gina [name] presents today for a follow up visit by her self. Since the last visit, she took a trip to [place] for 17 days and return without complications. She was able to use Imodium to control her diarrhea at that time. She reports that she took 4 Imodium's and 17 days. Her mobility although is limited but is able to enjoy her trip properly.   She has not reported any major changes in her bowel habits. She does report occasional loose bowel movements but without any blood or mucus. She has not reported any abdominal pain. She underwent a colonoscopy in December 2015 without any incident.  She denies rectal bleeding.   She has not reported any headaches or blurry vision or double vision. She does not report any fevers, chills or sweats. Her appetite is excellent and have gained weight. She has not reported any chest pain or shortness of breath. Has not reported any frequency urgency or hesitancy. Has not reported any musca skeletal complaints. Rest of the review of system is unremarkable.  Medications: I have reviewed the patient's current medications. Current Outpatient Prescriptions  Medication Sig Dispense Refill  . acetaminophen (TYLENOL) 500 MG tablet Take 500 mg by mouth every 6 (six) hours as needed. For pain    . allopurinol (ZYLOPRIM) 100 MG tablet Take 1 tablet (100 mg total) by mouth daily. 90 tablet 3  . amLODipine (NORVASC) 5 MG tablet Take 1 tablet (5 mg total) by mouth daily. 90 tablet 3  . atorvastatin (LIPITOR) 40 MG tablet Take 1 tablet (40 mg total) by mouth daily.  90 tablet 3  . Calcium Carbonate (CALCIUM 600 PO) Take 600 mg by mouth 2 (two) times daily.     [name] Clocortolone Pivalate (CLODERM) 0.1 % cream Apply 1 application topically daily.    . clopidogrel (PLAVIX) 75 MG tablet Take 1 tablet (75 mg total) by mouth daily. 90 tablet 3  . enalapril (VASOTEC) 10 MG tablet Take 1 tablet (10 mg total) by mouth daily. 90 tablet 3  . hydrochlorothiazide (MICROZIDE) 12.5 MG capsule Take 1 capsule (12.5 mg total) by mouth daily. 90 capsule 3  . Loratadine 10 MG CAPS Take 10 mg by mouth daily.     . MULTIPLE VITAMIN PO Take 1 tablet by mouth daily.     . Multiple Vitamins-Minerals (OCUVITE PRESERVISION) TABS Take 1 tablet by mouth 2 (two) times daily.     . pramoxine-hydrocortisone (PROCTOCREAM-HC) 1-1 % rectal cream Place 1 application rectally 2 (two) times daily. 30 g 1  . Ascorbic Acid (VITAMIN C) 100 MG tablet Take 500 mg by mouth daily.      No current facility-administered medications for this visit.    Allergies:  Allergies  Allergen Reactions  . Aspirin     REACTION: Hives  . Penicillins     REACTION: Hives    Past Medical History, Surgical history, Social history, and Family History were reviewed and updated.   Physical Exam: Blood pressure 168/60, Gina [name] 82, temperature 98.5 F (36.9 C), temperature source Oral, resp. rate 18, height '5\' 2"'$  (1.575 m), weight 187 lb 12.8 oz (85.186 kg), SpO2  94 %. ECOG: 1 General appearance: alert awake woman appeared younger than stated age. Head: Normocephalic, without obvious abnormality oral mucosa is moist and pink. Neck: no adenopathy Lymph nodes: Cervical, supraclavicular, and axillary nodes normal. Heart:regular rate and rhythm, S1, S2 normal, no murmur, click, rub or gallop Lung:chest clear, no wheezing, rales, normal symmetric air entry Abdomin: soft, non-tender, without masses or organomegaly no shifting dullness or ascites. EXT:no erythema, induration, or nodules   Lab Results: Lab Results   Component Value Date   WBC 4.7 11/06/2014   HGB 13.5 11/06/2014   HCT 40.6 11/06/2014   MCV 99.5 11/06/2014   PLT 202 11/06/2014     Chemistry      Component Value Date/Time   NA 143 11/06/2014 0955   NA 140 09/09/2014 1055   NA 143 05/30/2011 1150   K 3.9 11/06/2014 0955   K 4.1 09/09/2014 1055   K 3.8 05/30/2011 1150   CL 100 09/09/2014 1055   CL 101 06/20/2012 0922   CL 97* 05/30/2011 1150   CO2 31* 11/06/2014 0955   CO2 32 09/09/2014 1055   CO2 30 05/30/2011 1150   BUN 22.1 11/06/2014 0955   BUN 19 09/09/2014 1055   BUN 18 05/30/2011 1150   CREATININE 0.8 11/06/2014 0955   CREATININE 0.83 09/09/2014 1055   CREATININE 0.8 05/30/2011 1150      Component Value Date/Time   CALCIUM 9.3 11/06/2014 0955   CALCIUM 9.7 09/09/2014 1055   CALCIUM 9.6 05/30/2011 1150   ALKPHOS 74 11/06/2014 0955   ALKPHOS 71 09/09/2014 1055   ALKPHOS 78 05/30/2011 1150   AST 25 11/06/2014 0955   AST 26 09/09/2014 1055   AST 28 05/30/2011 1150   ALT 20 11/06/2014 0955   ALT 18 09/09/2014 1055   ALT 26 05/30/2011 1150   BILITOT 0.59 11/06/2014 0955   BILITOT 0.8 09/09/2014 1055   BILITOT 0.90 05/30/2011 1150      Results for Gina [name], Gina [name] (MRN [id number]) as of 05/08/2014 10:22  Ref. Range 05/06/2014 11:05  CEA Latest Range: 0.0-5.0 ng/mL 2.2   FINDINGS: Lower chest: Lingular volume loss and scarring. No change in a vague 5 mm left lower lobe subpleural pulmonary nodule, benign. Mild cardiomegaly with coronary artery atherosclerosis. No pericardial or pleural effusion.  Hepatobiliary: Mild hepatic steatosis, without focal liver lesion. Normal gallbladder, without biliary ductal dilatation.  Pancreas: Normal, without mass or ductal dilatation.  Spleen: Normal  Adrenals/Urinary Tract: Normal adrenal glands. Bilateral too small to characterize renal lesions. Interpolar right renal cyst. 3 mm left renal collecting system calculus. No hydronephrosis. Pelvic floor laxity.  Mild cystocele.  Stomach/Bowel: Normal stomach, without wall thickening. Extensive colonic diverticulosis. Partial right hemicolectomy. Normal small bowel.  Vascular/Lymphatic: Advanced aortic and branch vessel atherosclerosis. No abdominopelvic adenopathy.  Reproductive: Normal appearance of the uterus, other than pelvic floor laxity. No adnexal mass.  Other: No significant free fluid. No evidence of omental or peritoneal disease. Areas of anterior abdominal and pelvic wall laxity containing fat. The abdominal wall laxity contains nonobstructive transverse colon.  Musculoskeletal: Bone island in the right ischium. Degenerative disc disease at L4-5 and L5-S1. Trace L4-5 anterolisthesis is likely degenerative.  IMPRESSION: 1. Partial right hemicolectomy, without recurrent or metastatic disease. 2. Mild hepatic steatosis. 3. Pelvic floor laxity with a cystocele.   Impression and Plan:   81 year old woman with:  1. T3 N0 poorly-differentiated adenocarcinoma of the ascending colon. She is status post laparoscopic-assisted right hemicolectomy on 01/21/2011. She is currently on  active surveillance without any evidence of relapse.  Her CT scan and labs from  05/06/2014 was reviewed again and showed no evidence to suggest cancer relapse.  She is doing well without any clinical signs or symptoms of relapse.   The plan is to continue with active surveillance and repeat laboratory testing and imaging studies in  6 months. She will likely not need any CT scans beyond that.  2. Colonoscopy screen: She is up to date with her last one on December 2015. This will be repeated in 3 years.  3. Loose bowel movements: This appears to be under reasonable control for the time being. She takes Imodium as needed.  4. Elevated triglycerides: Is managed by her primary care physician. I last triglycerides from July 2016 are slightly lower than previous ones.  5. Follow-up: Will be in 6 months  with repeat imaging studies at that time.     [facility], MD 9/22/201610:37 AM

## 2014-11-06 NOTE — Telephone Encounter (Signed)
Gave and prnited appt sched and avs fo rpt for March 2017....gave barium

## 2015-02-04 ENCOUNTER — Telehealth: Payer: Self-pay | Admitting: *Deleted

## 2015-02-04 MED ORDER — PROMETHAZINE-CODEINE 6.25-10 MG/5ML PO SYRP: 5.0000 mL | ORAL_SOLUTION | ORAL | Status: DC | PRN

## 2015-02-04 NOTE — Telephone Encounter (Signed)
Pt left msg on triage stating she had a cold week ago, but she can't get over the cough. The mucus that is coming up is clear, no fever, or SOB. Been taking Robitussin otc, but wanting to see if md would rx stronger cough syrup...[name]

## 2015-02-04 NOTE — Telephone Encounter (Signed)
OK Prom cod syr  thx

## 2015-02-04 NOTE — Telephone Encounter (Signed)
Call cough syrup into [facility] spoke with Sara/pharmacist gave md authorization. Notified pt rx called into [facility]...Gina [name]

## 2015-03-03 ENCOUNTER — Telehealth: Payer: Self-pay | Admitting: Internal Medicine

## 2015-03-03 NOTE — Telephone Encounter (Signed)
Pt called in and wanted to know if she needs to fast for her appt on the 31st?

## 2015-03-03 NOTE — Telephone Encounter (Signed)
No need to fast Thx

## 2015-03-04 NOTE — Telephone Encounter (Signed)
Notified pt with md response.../lmb 

## 2015-03-17 ENCOUNTER — Ambulatory Visit (INDEPENDENT_AMBULATORY_CARE_PROVIDER_SITE_OTHER): Payer: Medicare Other | Admitting: Internal Medicine

## 2015-03-17 ENCOUNTER — Encounter: Payer: Self-pay | Admitting: Internal Medicine

## 2015-03-17 VITALS — BP 108/54 | HR 85 | Wt 186.0 lb

## 2015-03-17 DIAGNOSIS — I739 Peripheral vascular disease, unspecified: Secondary | ICD-10-CM | POA: Insufficient documentation

## 2015-03-17 DIAGNOSIS — M109 Gout, unspecified: Secondary | ICD-10-CM | POA: Diagnosis not present

## 2015-03-17 DIAGNOSIS — Z0000 Encounter for general adult medical examination without abnormal findings: Secondary | ICD-10-CM | POA: Diagnosis not present

## 2015-03-17 DIAGNOSIS — C182 Malignant neoplasm of ascending colon: Secondary | ICD-10-CM

## 2015-03-17 DIAGNOSIS — R202 Paresthesia of skin: Secondary | ICD-10-CM

## 2015-03-17 DIAGNOSIS — I872 Venous insufficiency (chronic) (peripheral): Secondary | ICD-10-CM

## 2015-03-17 DIAGNOSIS — I10 Essential (primary) hypertension: Secondary | ICD-10-CM | POA: Diagnosis not present

## 2015-03-17 DIAGNOSIS — E785 Hyperlipidemia, unspecified: Secondary | ICD-10-CM

## 2015-03-17 DIAGNOSIS — M8580 Other specified disorders of bone density and structure, unspecified site: Secondary | ICD-10-CM

## 2015-03-17 NOTE — Assessment & Plan Note (Signed)
CT and labs are due in 3/17 F/u w/Dr Alen Blew

## 2015-03-17 NOTE — Patient Instructions (Signed)
Preventive Care for Adults, Female A healthy lifestyle and preventive care can promote health and wellness. Preventive health guidelines for women include the following key practices.  A routine yearly physical is a good way to check with your health care provider about your health and preventive screening. It is a chance to share any concerns and updates on your health and to receive a thorough exam.  Visit your dentist for a routine exam and preventive care every 6 months. Brush your teeth twice a day and floss once a day. Good oral hygiene prevents tooth decay and gum disease.  The frequency of eye exams is based on your age, health, family medical history, use of contact lenses, and other factors. Follow your health care provider's recommendations for frequency of eye exams.  Eat a healthy diet. Foods like vegetables, fruits, whole grains, low-fat dairy products, and lean protein foods contain the nutrients you need without too many calories. Decrease your intake of foods high in solid fats, added sugars, and salt. Eat the right amount of calories for you.Get information about a proper diet from your health care provider, if necessary.  Regular physical exercise is one of the most important things you can do for your health. Most adults should get at least 150 minutes of moderate-intensity exercise (any activity that increases your heart rate and causes you to sweat) each week. In addition, most adults need muscle-strengthening exercises on 2 or more days a week.  Maintain a healthy weight. The body mass index (BMI) is a screening tool to identify possible weight problems. It provides an estimate of body fat based on height and weight. Your health care provider can find your BMI and can help you achieve or maintain a healthy weight.For adults 20 years and older:  A BMI below 18.5 is considered underweight.  A BMI of 18.5 to 24.9 is normal.  A BMI of 25 to 29.9 is considered overweight.  A  BMI of 30 and above is considered obese.  Maintain normal blood lipids and cholesterol levels by exercising and minimizing your intake of saturated fat. Eat a balanced diet with plenty of fruit and vegetables. Blood tests for lipids and cholesterol should begin at age 45 and be repeated every 5 years. If your lipid or cholesterol levels are high, you are over 50, or you are at high risk for heart disease, you may need your cholesterol levels checked more frequently.Ongoing high lipid and cholesterol levels should be treated with medicines if diet and exercise are not working.  If you smoke, find out from your health care provider how to quit. If you do not use tobacco, do not start.  Lung cancer screening is recommended for adults aged 45-80 years who are at high risk for developing lung cancer because of a history of smoking. A yearly low-dose CT scan of the lungs is recommended for people who have at least a 30-pack-year history of smoking and are a current smoker or have quit within the past 15 years. A pack year of smoking is smoking an average of 1 pack of cigarettes a day for 1 year (for example: 1 pack a day for 30 years or 2 packs a day for 15 years). Yearly screening should continue until the smoker has stopped smoking for at least 15 years. Yearly screening should be stopped for people who develop a health problem that would prevent them from having lung cancer treatment.  If you are pregnant, do not drink alcohol. If you are  breastfeeding, be very cautious about drinking alcohol. If you are not pregnant and choose to drink alcohol, do not have more than 1 drink per day. One drink is considered to be 12 ounces (355 mL) of beer, 5 ounces (148 mL) of wine, or 1.5 ounces (44 mL) of liquor.  Avoid use of street drugs. Do not share needles with anyone. Ask for help if you need support or instructions about stopping the use of drugs.  High blood pressure causes heart disease and increases the risk  of stroke. Your blood pressure should be checked at least every 1 to 2 years. Ongoing high blood pressure should be treated with medicines if weight loss and exercise do not work.  If you are 55-79 years old, ask your health care provider if you should take aspirin to prevent strokes.  Diabetes screening is done by taking a blood sample to check your blood glucose level after you have not eaten for a certain period of time (fasting). If you are not overweight and you do not have risk factors for diabetes, you should be screened once every 3 years starting at age 45. If you are overweight or obese and you are 40-70 years of age, you should be screened for diabetes every year as part of your cardiovascular risk assessment.  Breast cancer screening is essential preventive care for women. You should practice "breast self-awareness." This means understanding the normal appearance and feel of your breasts and may include breast self-examination. Any changes detected, no matter how small, should be reported to a health care provider. Women in their 20s and 30s should have a clinical breast exam (CBE) by a health care provider as part of a regular health exam every 1 to 3 years. After age 40, women should have a CBE every year. Starting at age 40, women should consider having a mammogram (breast X-ray test) every year. Women who have a family history of breast cancer should talk to their health care provider about genetic screening. Women at a high risk of breast cancer should talk to their health care providers about having an MRI and a mammogram every year.  Breast cancer gene (BRCA)-related cancer risk assessment is recommended for women who have family members with BRCA-related cancers. BRCA-related cancers include breast, ovarian, tubal, and peritoneal cancers. Having family members with these cancers may be associated with an increased risk for harmful changes (mutations) in the breast cancer genes BRCA1 and  BRCA2. Results of the assessment will determine the need for genetic counseling and BRCA1 and BRCA2 testing.  Your health care provider may recommend that you be screened regularly for cancer of the pelvic organs (ovaries, uterus, and vagina). This screening involves a pelvic examination, including checking for microscopic changes to the surface of your cervix (Pap test). You may be encouraged to have this screening done every 3 years, beginning at age 21.  For women ages 30-65, health care providers may recommend pelvic exams and Pap testing every 3 years, or they may recommend the Pap and pelvic exam, combined with testing for human papilloma virus (HPV), every 5 years. Some types of HPV increase your risk of cervical cancer. Testing for HPV may also be done on women of any age with unclear Pap test results.  Other health care providers may not recommend any screening for nonpregnant women who are considered low risk for pelvic cancer and who do not have symptoms. Ask your health care provider if a screening pelvic exam is right for   you.  If you have had past treatment for cervical cancer or a condition that could lead to cancer, you need Pap tests and screening for cancer for at least 20 years after your treatment. If Pap tests have been discontinued, your risk factors (such as having a new sexual partner) need to be reassessed to determine if screening should resume. Some women have medical problems that increase the chance of getting cervical cancer. In these cases, your health care provider may recommend more frequent screening and Pap tests.  Colorectal cancer can be detected and often prevented. Most routine colorectal cancer screening begins at the age of 50 years and continues through age 75 years. However, your health care provider may recommend screening at an earlier age if you have risk factors for colon cancer. On a yearly basis, your health care provider may provide home test kits to check  for hidden blood in the stool. Use of a small camera at the end of a tube, to directly examine the colon (sigmoidoscopy or colonoscopy), can detect the earliest forms of colorectal cancer. Talk to your health care provider about this at age 50, when routine screening begins. Direct exam of the colon should be repeated every 5-10 years through age 75 years, unless early forms of precancerous polyps or small growths are found.  People who are at an increased risk for hepatitis B should be screened for this virus. You are considered at high risk for hepatitis B if:  You were born in a country where hepatitis B occurs often. Talk with your health care provider about which countries are considered high risk.  Your parents were born in a high-risk country and you have not received a shot to protect against hepatitis B (hepatitis B vaccine).  You have HIV or AIDS.  You use needles to inject street drugs.  You live with, or have sex with, someone who has hepatitis B.  You get hemodialysis treatment.  You take certain medicines for conditions like cancer, organ transplantation, and autoimmune conditions.  Hepatitis C blood testing is recommended for all people born from 1945 through 1965 and any individual with known risks for hepatitis C.  Practice safe sex. Use condoms and avoid high-risk sexual practices to reduce the spread of sexually transmitted infections (STIs). STIs include gonorrhea, chlamydia, syphilis, trichomonas, herpes, HPV, and human immunodeficiency virus (HIV). Herpes, HIV, and HPV are viral illnesses that have no cure. They can result in disability, cancer, and death.  You should be screened for sexually transmitted illnesses (STIs) including gonorrhea and chlamydia if:  You are sexually active and are younger than 24 years.  You are older than 24 years and your health care provider tells you that you are at risk for this type of infection.  Your sexual activity has changed  since you were last screened and you are at an increased risk for chlamydia or gonorrhea. Ask your health care provider if you are at risk.  If you are at risk of being infected with HIV, it is recommended that you take a prescription medicine daily to prevent HIV infection. This is called preexposure prophylaxis (PrEP). You are considered at risk if:  You are sexually active and do not regularly use condoms or know the HIV status of your partner(s).  You take drugs by injection.  You are sexually active with a partner who has HIV.  Talk with your health care provider about whether you are at high risk of being infected with HIV. If   you choose to begin PrEP, you should first be tested for HIV. You should then be tested every 3 months for as long as you are taking PrEP.  Osteoporosis is a disease in which the bones lose minerals and strength with aging. This can result in serious bone fractures or breaks. The risk of osteoporosis can be identified using a bone density scan. Women ages 67 years and over and women at risk for fractures or osteoporosis should discuss screening with their health care providers. Ask your health care provider whether you should take a calcium supplement or vitamin D to reduce the rate of osteoporosis.  Menopause can be associated with physical symptoms and risks. Hormone replacement therapy is available to decrease symptoms and risks. You should talk to your health care provider about whether hormone replacement therapy is right for you.  Use sunscreen. Apply sunscreen liberally and repeatedly throughout the day. You should seek shade when your shadow is shorter than you. Protect yourself by wearing long sleeves, pants, a wide-brimmed hat, and sunglasses year round, whenever you are outdoors.  Once a month, do a whole body skin exam, using a mirror to look at the skin on your back. Tell your health care provider of new moles, moles that have irregular borders, moles that  are larger than a pencil eraser, or moles that have changed in shape or color.  Stay current with required vaccines (immunizations).  Influenza vaccine. All adults should be immunized every year.  Tetanus, diphtheria, and acellular pertussis (Td, Tdap) vaccine. Pregnant women should receive 1 dose of Tdap vaccine during each pregnancy. The dose should be obtained regardless of the length of time since the last dose. Immunization is preferred during the 27th-36th week of gestation. An adult who has not previously received Tdap or who does not know her vaccine status should receive 1 dose of Tdap. This initial dose should be followed by tetanus and diphtheria toxoids (Td) booster doses every 10 years. Adults with an unknown or incomplete history of completing a 3-dose immunization series with Td-containing vaccines should begin or complete a primary immunization series including a Tdap dose. Adults should receive a Td booster every 10 years.  Varicella vaccine. An adult without evidence of immunity to varicella should receive 2 doses or a second dose if she has previously received 1 dose. Pregnant females who do not have evidence of immunity should receive the first dose after pregnancy. This first dose should be obtained before leaving the health care facility. The second dose should be obtained 4-8 weeks after the first dose.  Human papillomavirus (HPV) vaccine. Females aged 13-26 years who have not received the vaccine previously should obtain the 3-dose series. The vaccine is not recommended for use in pregnant females. However, pregnancy testing is not needed before receiving a dose. If a female is found to be pregnant after receiving a dose, no treatment is needed. In that case, the remaining doses should be delayed until after the pregnancy. Immunization is recommended for any person with an immunocompromised condition through the age of 61 years if she did not get any or all doses earlier. During the  3-dose series, the second dose should be obtained 4-8 weeks after the first dose. The third dose should be obtained 24 weeks after the first dose and 16 weeks after the second dose.  Zoster vaccine. One dose is recommended for adults aged 30 years or older unless certain conditions are present.  Measles, mumps, and rubella (MMR) vaccine. Adults born  before 1957 generally are considered immune to measles and mumps. Adults born in 1957 or later should have 1 or more doses of MMR vaccine unless there is a contraindication to the vaccine or there is laboratory evidence of immunity to each of the three diseases. A routine second dose of MMR vaccine should be obtained at least 28 days after the first dose for students attending postsecondary schools, health care workers, or international travelers. People who received inactivated measles vaccine or an unknown type of measles vaccine during 1963-1967 should receive 2 doses of MMR vaccine. People who received inactivated mumps vaccine or an unknown type of mumps vaccine before 1979 and are at high risk for mumps infection should consider immunization with 2 doses of MMR vaccine. For females of childbearing age, rubella immunity should be determined. If there is no evidence of immunity, females who are not pregnant should be vaccinated. If there is no evidence of immunity, females who are pregnant should delay immunization until after pregnancy. Unvaccinated health care workers born before 1957 who lack laboratory evidence of measles, mumps, or rubella immunity or laboratory confirmation of disease should consider measles and mumps immunization with 2 doses of MMR vaccine or rubella immunization with 1 dose of MMR vaccine.  Pneumococcal 13-valent conjugate (PCV13) vaccine. When indicated, a person who is uncertain of his immunization history and has no record of immunization should receive the PCV13 vaccine. All adults 65 years of age and older should receive this  vaccine. An adult aged 19 years or older who has certain medical conditions and has not been previously immunized should receive 1 dose of PCV13 vaccine. This PCV13 should be followed with a dose of pneumococcal polysaccharide (PPSV23) vaccine. Adults who are at high risk for pneumococcal disease should obtain the PPSV23 vaccine at least 8 weeks after the dose of PCV13 vaccine. Adults older than 80 years of age who have normal immune system function should obtain the PPSV23 vaccine dose at least 1 year after the dose of PCV13 vaccine.  Pneumococcal polysaccharide (PPSV23) vaccine. When PCV13 is also indicated, PCV13 should be obtained first. All adults aged 65 years and older should be immunized. An adult younger than age 65 years who has certain medical conditions should be immunized. Any person who resides in a nursing home or long-term care facility should be immunized. An adult smoker should be immunized. People with an immunocompromised condition and certain other conditions should receive both PCV13 and PPSV23 vaccines. People with human immunodeficiency virus (HIV) infection should be immunized as soon as possible after diagnosis. Immunization during chemotherapy or radiation therapy should be avoided. Routine use of PPSV23 vaccine is not recommended for American Indians, [place] Natives, or people younger than 65 years unless there are medical conditions that require PPSV23 vaccine. When indicated, people who have unknown immunization and have no record of immunization should receive PPSV23 vaccine. One-time revaccination 5 years after the first dose of PPSV23 is recommended for people aged 19-64 years who have chronic kidney failure, nephrotic syndrome, asplenia, or immunocompromised conditions. People who received 1-2 doses of PPSV23 before age 65 years should receive another dose of PPSV23 vaccine at age 65 years or later if at least 5 years have passed since the previous dose. Doses of PPSV23 are not  needed for people immunized with PPSV23 at or after age 65 years.  Meningococcal vaccine. Adults with asplenia or persistent complement component deficiencies should receive 2 doses of quadrivalent meningococcal conjugate (MenACWY-D) vaccine. The doses should be obtained   at least 2 months apart. Microbiologists working with certain meningococcal bacteria, [place] recruits, people at risk during an outbreak, and people who travel to or live in countries with a high rate of meningitis should be immunized. A first-year college student up through age 34 years who is living in a residence hall should receive a dose if she did not receive a dose on or after her 16th birthday. Adults who have certain high-risk conditions should receive one or more doses of vaccine.  Hepatitis A vaccine. Adults who wish to be protected from this disease, have certain high-risk conditions, work with hepatitis A-infected animals, work in hepatitis A research labs, or travel to or work in countries with a high rate of hepatitis A should be immunized. Adults who were previously unvaccinated and who anticipate close contact with an international adoptee during the first 60 days after arrival in the [place] from a country with a high rate of hepatitis A should be immunized.  Hepatitis B vaccine. Adults who wish to be protected from this disease, have certain high-risk conditions, may be exposed to blood or other infectious body fluids, are household contacts or sex partners of hepatitis B positive people, are clients or workers in certain care facilities, or travel to or work in countries with a high rate of hepatitis B should be immunized.  Haemophilus influenzae type b (Hib) vaccine. A previously unvaccinated person with asplenia or sickle cell disease or having a scheduled splenectomy should receive 1 dose of Hib vaccine. Regardless of previous immunization, a recipient of a hematopoietic stem cell transplant should receive a  3-dose series 6-12 months after her successful transplant. Hib vaccine is not recommended for adults with HIV infection. Preventive Services / Frequency Ages 35 to 4 years  Blood pressure check.** / Every 3-5 years.  Lipid and cholesterol check.** / Every 5 years beginning at age 60.  Clinical breast exam.** / Every 3 years for women in their 71s and 10s.  BRCA-related cancer risk assessment.** / For women who have family members with a BRCA-related cancer (breast, ovarian, tubal, or peritoneal cancers).  Pap test.** / Every 2 years from ages 76 through 26. Every 3 years starting at age 61 through age 76 or 93 with a history of 3 consecutive normal Pap tests.  HPV screening.** / Every 3 years from ages 37 through ages 60 to 51 with a history of 3 consecutive normal Pap tests.  Hepatitis C blood test.** / For any individual with known risks for hepatitis C.  Skin self-exam. / Monthly.  Influenza vaccine. / Every year.  Tetanus, diphtheria, and acellular pertussis (Tdap, Td) vaccine.** / Consult your health care provider. Pregnant women should receive 1 dose of Tdap vaccine during each pregnancy. 1 dose of Td every 10 years.  Varicella vaccine.** / Consult your health care provider. Pregnant females who do not have evidence of immunity should receive the first dose after pregnancy.  HPV vaccine. / 3 doses over 6 months, if 93 and younger. The vaccine is not recommended for use in pregnant females. However, pregnancy testing is not needed before receiving a dose.  Measles, mumps, rubella (MMR) vaccine.** / You need at least 1 dose of MMR if you were born in 1957 or later. You may also need a 2nd dose. For females of childbearing age, rubella immunity should be determined. If there is no evidence of immunity, females who are not pregnant should be vaccinated. If there is no evidence of immunity, females who are  pregnant should delay immunization until after pregnancy.  Pneumococcal  13-valent conjugate (PCV13) vaccine.** / Consult your health care provider.  Pneumococcal polysaccharide (PPSV23) vaccine.** / 1 to 2 doses if you smoke cigarettes or if you have certain conditions.  Meningococcal vaccine.** / 1 dose if you are age 68 to 8 years and a Market researcher living in a residence hall, or have one of several medical conditions, you need to get vaccinated against meningococcal disease. You may also need additional booster doses.  Hepatitis A vaccine.** / Consult your health care provider.  Hepatitis B vaccine.** / Consult your health care provider.  Haemophilus influenzae type b (Hib) vaccine.** / Consult your health care provider. Ages 7 to 53 years  Blood pressure check.** / Every year.  Lipid and cholesterol check.** / Every 5 years beginning at age 25 years.  Lung cancer screening. / Every year if you are aged 11-80 years and have a 30-pack-year history of smoking and currently smoke or have quit within the past 15 years. Yearly screening is stopped once you have quit smoking for at least 15 years or develop a health problem that would prevent you from having lung cancer treatment.  Clinical breast exam.** / Every year after age 48 years.  BRCA-related cancer risk assessment.** / For women who have family members with a BRCA-related cancer (breast, ovarian, tubal, or peritoneal cancers).  Mammogram.** / Every year beginning at age 41 years and continuing for as long as you are in good health. Consult with your health care provider.  Pap test.** / Every 3 years starting at age 65 years through age 37 or 70 years with a history of 3 consecutive normal Pap tests.  HPV screening.** / Every 3 years from ages 72 years through ages 60 to 40 years with a history of 3 consecutive normal Pap tests.  Fecal occult blood test (FOBT) of stool. / Every year beginning at age 21 years and continuing until age 5 years. You may not need to do this test if you get  a colonoscopy every 10 years.  Flexible sigmoidoscopy or colonoscopy.** / Every 5 years for a flexible sigmoidoscopy or every 10 years for a colonoscopy beginning at age 35 years and continuing until age 48 years.  Hepatitis C blood test.** / For all people born from [birth year] through 1965 and any individual with known risks for hepatitis C.  Skin self-exam. / Monthly.  Influenza vaccine. / Every year.  Tetanus, diphtheria, and acellular pertussis (Tdap/Td) vaccine.** / Consult your health care provider. Pregnant women should receive 1 dose of Tdap vaccine during each pregnancy. 1 dose of Td every 10 years.  Varicella vaccine.** / Consult your health care provider. Pregnant females who do not have evidence of immunity should receive the first dose after pregnancy.  Zoster vaccine.** / 1 dose for adults aged 30 years or older.  Measles, mumps, rubella (MMR) vaccine.** / You need at least 1 dose of MMR if you were born in 1957 or later. You may also need a second dose. For females of childbearing age, rubella immunity should be determined. If there is no evidence of immunity, females who are not pregnant should be vaccinated. If there is no evidence of immunity, females who are pregnant should delay immunization until after pregnancy.  Pneumococcal 13-valent conjugate (PCV13) vaccine.** / Consult your health care provider.  Pneumococcal polysaccharide (PPSV23) vaccine.** / 1 to 2 doses if you smoke cigarettes or if you have certain conditions.  Meningococcal vaccine.** /  Consult your health care provider.  Hepatitis A vaccine.** / Consult your health care provider.  Hepatitis B vaccine.** / Consult your health care provider.  Haemophilus influenzae type b (Hib) vaccine.** / Consult your health care provider. Ages 64 years and over  Blood pressure check.** / Every year.  Lipid and cholesterol check.** / Every 5 years beginning at age 23 years.  Lung cancer screening. / Every year if you  are aged 16-80 years and have a 30-pack-year history of smoking and currently smoke or have quit within the past 15 years. Yearly screening is stopped once you have quit smoking for at least 15 years or develop a health problem that would prevent you from having lung cancer treatment.  Clinical breast exam.** / Every year after age 74 years.  BRCA-related cancer risk assessment.** / For women who have family members with a BRCA-related cancer (breast, ovarian, tubal, or peritoneal cancers).  Mammogram.** / Every year beginning at age 44 years and continuing for as long as you are in good health. Consult with your health care provider.  Pap test.** / Every 3 years starting at age 58 years through age 22 or 39 years with 3 consecutive normal Pap tests. Testing can be stopped between 65 and 70 years with 3 consecutive normal Pap tests and no abnormal Pap or HPV tests in the past 10 years.  HPV screening.** / Every 3 years from ages 64 years through ages 70 or 61 years with a history of 3 consecutive normal Pap tests. Testing can be stopped between 65 and 70 years with 3 consecutive normal Pap tests and no abnormal Pap or HPV tests in the past 10 years.  Fecal occult blood test (FOBT) of stool. / Every year beginning at age 40 years and continuing until age 27 years. You may not need to do this test if you get a colonoscopy every 10 years.  Flexible sigmoidoscopy or colonoscopy.** / Every 5 years for a flexible sigmoidoscopy or every 10 years for a colonoscopy beginning at age 7 years and continuing until age 32 years.  Hepatitis C blood test.** / For all people born from [birth year] through 1963 and any individual with known risks for hepatitis C.  Osteoporosis screening.** / A one-time screening for women ages 30 years and over and women at risk for fractures or osteoporosis.  Skin self-exam. / Monthly.  Influenza vaccine. / Every year.  Tetanus, diphtheria, and acellular pertussis (Tdap/Td)  vaccine.** / 1 dose of Td every 10 years.  Varicella vaccine.** / Consult your health care provider.  Zoster vaccine.** / 1 dose for adults aged 35 years or older.  Pneumococcal 13-valent conjugate (PCV13) vaccine.** / Consult your health care provider.  Pneumococcal polysaccharide (PPSV23) vaccine.** / 1 dose for all adults aged 46 years and older.  Meningococcal vaccine.** / Consult your health care provider.  Hepatitis A vaccine.** / Consult your health care provider.  Hepatitis B vaccine.** / Consult your health care provider.  Haemophilus influenzae type b (Hib) vaccine.** / Consult your health care provider. ** Family history and personal history of risk and conditions may change your health care provider's recommendations.   This information is not intended to replace advice given to you by your health care provider. Make sure you discuss any questions you have with your health care provider.   Document Released: 03/29/2001 Document Revised: 02/21/2014 Document Reviewed: 06/28/2010 Elsevier Interactive Patient Education [employer].

## 2015-03-17 NOTE — Progress Notes (Signed)
Subjective:  Patient ID: Gina [name], female    DOB: 08-04-1931  Age: 82 y.o. MRN: [id number]  CC: No chief complaint on file.   HPI Gina [name] presents for a well exam. C/o LLE edema. F/u HTN.   Outpatient Prescriptions Prior to Visit  Medication Sig Dispense Refill  . acetaminophen (TYLENOL) 500 MG tablet Take 500 mg by mouth every 6 (six) hours as needed. For pain    . allopurinol (ZYLOPRIM) 100 MG tablet Take 1 tablet (100 mg total) by mouth daily. 90 tablet 3  . amLODipine (NORVASC) 5 MG tablet Take 1 tablet (5 mg total) by mouth daily. 90 tablet 3  . Ascorbic Acid (VITAMIN C) 100 MG tablet Take 500 mg by mouth daily.     [name] atorvastatin (LIPITOR) 40 MG tablet Take 1 tablet (40 mg total) by mouth daily. 90 tablet 3  . Calcium Carbonate (CALCIUM 600 PO) Take 600 mg by mouth 2 (two) times daily.     [name] Clocortolone Pivalate (CLODERM) 0.1 % cream Apply 1 application topically daily.    . clopidogrel (PLAVIX) 75 MG tablet Take 1 tablet (75 mg total) by mouth daily. 90 tablet 3  . enalapril (VASOTEC) 10 MG tablet Take 1 tablet (10 mg total) by mouth daily. 90 tablet 3  . hydrochlorothiazide (MICROZIDE) 12.5 MG capsule Take 1 capsule (12.5 mg total) by mouth daily. 90 capsule 3  . Loratadine 10 MG CAPS Take 10 mg by mouth daily.     . MULTIPLE VITAMIN PO Take 1 tablet by mouth daily.     . Multiple Vitamins-Minerals (OCUVITE PRESERVISION) TABS Take 1 tablet by mouth 2 (two) times daily.     . pramoxine-hydrocortisone (PROCTOCREAM-HC) 1-1 % rectal cream Place 1 application rectally 2 (two) times daily. 30 g 1  . promethazine-codeine (PHENERGAN WITH CODEINE) 6.25-10 MG/5ML syrup Take 5 mLs by mouth every 4 (four) hours as needed. (Patient not taking: Reported on 03/17/2015) 300 mL 0   No facility-administered medications prior to visit.    ROS Review of Systems  Constitutional: Negative for chills, activity change, appetite change, fatigue and unexpected weight change.    HENT: Negative for congestion, ear pain, mouth sores and sinus pressure.   Eyes: Negative for visual disturbance.  Respiratory: Negative for cough and chest tightness.   Gastrointestinal: Negative for nausea, vomiting, abdominal pain and constipation.  Genitourinary: Negative for frequency, difficulty urinating and vaginal pain.  Musculoskeletal: Negative for back pain, gait problem and neck pain.  Skin: Negative for pallor and rash.  Neurological: Negative for dizziness, tremors, weakness, numbness and headaches.  Psychiatric/Behavioral: Negative for suicidal ideas, confusion and sleep disturbance.    Objective:  BP 108/54 mmHg  Pulse 85  Wt 186 lb (84.369 kg)  SpO2 95%  BP Readings from Last 3 Encounters:  03/17/15 108/54  11/06/14 168/60  09/09/14 138/80    Wt Readings from Last 3 Encounters:  03/17/15 186 lb (84.369 kg)  11/06/14 187 lb 12.8 oz (85.186 kg)  09/09/14 184 lb (83.462 kg)    Physical Exam  Constitutional: She appears well-developed. No distress.  HENT:  Head: Normocephalic.  Right Ear: External ear normal.  Left Ear: External ear normal.  Nose: Nose normal.  Mouth/Throat: Oropharynx is clear and moist.  Eyes: Conjunctivae are normal. Pupils are equal, round, and reactive to light. Right eye exhibits no discharge. Left eye exhibits no discharge.  Neck: Normal range of motion. Neck supple. No JVD present. No tracheal deviation present. No  thyromegaly present.  Cardiovascular: Normal rate, regular rhythm and normal heart sounds.   Pulmonary/Chest: No stridor. No respiratory distress. She has no wheezes.  Abdominal: Soft. Bowel sounds are normal. She exhibits no distension and no mass. There is no tenderness. There is no rebound and no guarding.  Musculoskeletal: She exhibits no edema or tenderness.  Lymphadenopathy:    She has no cervical adenopathy.  Neurological: She displays normal reflexes. No cranial nerve deficit. She exhibits normal muscle tone.  Coordination normal.  Skin: No rash noted. No erythema.  Psychiatric: She has a normal mood and affect. Her behavior is normal. Judgment and thought content normal.    Lab Results  Component Value Date   WBC 4.7 11/06/2014   HGB 13.5 11/06/2014   HCT 40.6 11/06/2014   PLT 202 11/06/2014   GLUCOSE 98 11/06/2014   CHOL 159 09/09/2014   TRIG 228.0* 09/09/2014   HDL 54.90 09/09/2014   LDLDIRECT 69.0 09/09/2014   LDLCALC 67 12/03/2009   ALT 20 11/06/2014   AST 25 11/06/2014   NA 143 11/06/2014   K 3.9 11/06/2014   CL 100 09/09/2014   CREATININE 0.8 11/06/2014   BUN 22.1 11/06/2014   CO2 31* 11/06/2014   TSH 2.21 09/09/2014   INR 0.94 06/07/2010   HGBA1C * 11/12/2009    5.7 (NOTE)                                                                       According to the ADA Clinical Practice Recommendations for 2011, when HbA1c is used as a screening test:   >=6.5%   Diagnostic of Diabetes Mellitus           (if abnormal result  is confirmed)  5.7-6.4%   Increased risk of developing Diabetes Mellitus  References:Diagnosis and Classification of Diabetes Mellitus,Diabetes JOAC,1660,63(KZSWF 1):S62-S69 and Standards of Medical Care in         Diabetes - 2011,Diabetes Care,2011,34  (Suppl 1):S11-S61.    Ct Abdomen Pelvis W Contrast  05/06/2014  CLINICAL DATA:  Colon cancer restaging. Diagnosed 12/12. Surgery only. Hypertension. Hyperlipidemia. EXAM: CT ABDOMEN AND PELVIS WITH CONTRAST TECHNIQUE: Multidetector CT imaging of the abdomen and pelvis was performed using the standard protocol following bolus administration of intravenous contrast. CONTRAST:  183m OMNIPAQUE IOHEXOL 300 MG/ML  SOLN COMPARISON:  06/11/2013 FINDINGS: Lower chest: Lingular volume loss and scarring. No change in a vague 5 mm left lower lobe subpleural pulmonary nodule, benign. Mild cardiomegaly with coronary artery atherosclerosis. No pericardial or pleural effusion. Hepatobiliary: Mild hepatic steatosis, without focal  liver lesion. Normal gallbladder, without biliary ductal dilatation. Pancreas: Normal, without mass or ductal dilatation. Spleen: Normal Adrenals/Urinary Tract: Normal adrenal glands. Bilateral too small to characterize renal lesions. Interpolar right renal cyst. 3 mm left renal collecting system calculus. No hydronephrosis. Pelvic floor laxity. Mild cystocele. Stomach/Bowel: Normal stomach, without wall thickening. Extensive colonic diverticulosis. Partial right hemicolectomy. Normal small bowel. Vascular/Lymphatic: Advanced aortic and branch vessel atherosclerosis. No abdominopelvic adenopathy. Reproductive: Normal appearance of the uterus, other than pelvic floor laxity. No adnexal mass. Other: No significant free fluid. No evidence of omental or peritoneal disease. Areas of anterior abdominal and pelvic wall laxity containing fat. The abdominal wall laxity contains nonobstructive transverse colon.  Musculoskeletal: Bone island in the right ischium. Degenerative disc disease at L4-5 and L5-S1. Trace L4-5 anterolisthesis is likely degenerative. IMPRESSION: 1. Partial right hemicolectomy, without recurrent or metastatic disease. 2. Mild hepatic steatosis. 3. Pelvic floor laxity with a cystocele. Electronically Signed   By: Abigail Miyamoto M.D.   On: 05/06/2014 14:52    Assessment & Plan:   Diagnoses and all orders for this visit:  Well adult exam -     Basic metabolic panel; Future -     Hepatic function panel; Future -     Lipid panel; Future -     TSH; Future -     Urinalysis; Future -     Vitamin B12; Future  Essential hypertension -     Basic metabolic panel; Future -     Hepatic function panel; Future -     Lipid panel; Future -     TSH; Future -     Urinalysis; Future -     Vitamin B12; Future  Cancer of right colon ([place]) -     Basic metabolic panel; Future -     Hepatic function panel; Future -     Lipid panel; Future -     TSH; Future -     Urinalysis; Future -     Vitamin B12;  Future  Chronic venous insufficiency -     Basic metabolic panel; Future -     Hepatic function panel; Future -     Lipid panel; Future -     TSH; Future -     Urinalysis; Future -     Vitamin B12; Future  GOUT -     Basic metabolic panel; Future -     Hepatic function panel; Future -     Lipid panel; Future -     TSH; Future -     Urinalysis; Future -     Vitamin B12; Future  Dyslipidemia -     Basic metabolic panel; Future -     Hepatic function panel; Future -     Lipid panel; Future -     TSH; Future -     Urinalysis; Future -     Vitamin B12; Future  Osteopenia -     Basic metabolic panel; Future -     Hepatic function panel; Future -     Lipid panel; Future -     TSH; Future -     Urinalysis; Future -     Vitamin B12; Future  Paresthesia -     Vitamin B12; Future   I am having Ms. Tuohy maintain her Loratadine, Calcium Carbonate (CALCIUM 600 PO), vitamin C, MULTIPLE VITAMIN PO, OCUVITE PRESERVISION, acetaminophen, Clocortolone Pivalate, pramoxine-hydrocortisone, allopurinol, amLODipine, atorvastatin, clopidogrel, enalapril, hydrochlorothiazide, and promethazine-codeine.  No orders of the defined types were placed in this encounter.     Follow-up: Return in about 6 months (around 09/14/2015) for a follow-up visit.  Walker Kehr, MD

## 2015-03-17 NOTE — Assessment & Plan Note (Signed)
L>R   x years Rx: Compression thigh highs 20-30 mm/hg

## 2015-03-17 NOTE — Assessment & Plan Note (Signed)
Vit D 

## 2015-03-17 NOTE — Assessment & Plan Note (Signed)
Atorvastatin 40 mg Labs

## 2015-03-17 NOTE — Assessment & Plan Note (Addendum)
Allopurinol Labs

## 2015-03-17 NOTE — Assessment & Plan Note (Signed)
Here for medicare wellness/physical  Diet: heart healthy  Physical activity: sedentary  Depression/mood screen: negative  Hearing: intact to whispered voice  Visual acuity: poor - macular degeneration, performs annual eye exam  ADLs: capable  Fall risk: moderate  Home safety: good  Cognitive evaluation: intact to orientation, naming, recall and repetition  EOL planning: adv directives, full code/ I agree  I have personally reviewed and have noted  1. The patient's medical, surgical and social history  2. Their use of alcohol, tobacco or illicit drugs  3. Their current medications and supplements  4. The patient's functional ability including ADL's, fall risks, home safety risks and hearing or visual impairment.  5. Diet and physical activities  6. Evidence for depression or mood disorders 7. The roster of all physicians providing medical care to patient - is listed in the Snapshot section of the chart and reviewed today.    Today patient counseled on age appropriate routine health concerns for screening and prevention, each reviewed and up to date or declined. Immunizations reviewed and up to date or declined. Labs ordered and reviewed. Risk factors for depression reviewed and negative. Hearing function and visual acuity are intact. ADLs screened and addressed as needed. Functional ability and level of safety reviewed and appropriate. Education, counseling and referrals performed based on assessed risks today. Patient provided with a copy of personalized plan for preventive services.

## 2015-03-17 NOTE — Progress Notes (Signed)
Pre visit review using our clinic review tool, if applicable. No additional management support is needed unless otherwise documented below in the visit note. 

## 2015-03-17 NOTE — Assessment & Plan Note (Signed)
Norvasc, Enalapril, HCTZ 

## 2015-04-09 DIAGNOSIS — Z803 Family history of malignant neoplasm of breast: Secondary | ICD-10-CM | POA: Diagnosis not present

## 2015-04-09 DIAGNOSIS — Z1231 Encounter for screening mammogram for malignant neoplasm of breast: Secondary | ICD-10-CM | POA: Diagnosis not present

## 2015-04-09 LAB — HM MAMMOGRAPHY

## 2015-04-13 ENCOUNTER — Encounter: Payer: Self-pay | Admitting: Internal Medicine

## 2015-04-14 DIAGNOSIS — L821 Other seborrheic keratosis: Secondary | ICD-10-CM | POA: Diagnosis not present

## 2015-04-14 DIAGNOSIS — B350 Tinea barbae and tinea capitis: Secondary | ICD-10-CM | POA: Diagnosis not present

## 2015-04-14 DIAGNOSIS — Z1283 Encounter for screening for malignant neoplasm of skin: Secondary | ICD-10-CM | POA: Diagnosis not present

## 2015-04-30 DIAGNOSIS — H43813 Vitreous degeneration, bilateral: Secondary | ICD-10-CM | POA: Diagnosis not present

## 2015-04-30 DIAGNOSIS — H35363 Drusen (degenerative) of macula, bilateral: Secondary | ICD-10-CM | POA: Diagnosis not present

## 2015-04-30 DIAGNOSIS — H353134 Nonexudative age-related macular degeneration, bilateral, advanced atrophic with subfoveal involvement: Secondary | ICD-10-CM | POA: Diagnosis not present

## 2015-05-06 ENCOUNTER — Other Ambulatory Visit (HOSPITAL_BASED_OUTPATIENT_CLINIC_OR_DEPARTMENT_OTHER): Payer: Medicare Other

## 2015-05-06 ENCOUNTER — Encounter (HOSPITAL_COMMUNITY): Payer: Self-pay

## 2015-05-06 ENCOUNTER — Ambulatory Visit (HOSPITAL_COMMUNITY)
Admission: RE | Admit: 2015-05-06 | Discharge: 2015-05-06 | Disposition: A | Discharge status: Home / Self Care | Payer: Medicare Other | Source: Ambulatory Visit | Attending: Oncology | Admitting: Oncology

## 2015-05-06 DIAGNOSIS — C182 Malignant neoplasm of ascending colon: Secondary | ICD-10-CM | POA: Diagnosis not present

## 2015-05-06 DIAGNOSIS — Z85038 Personal history of other malignant neoplasm of large intestine: Secondary | ICD-10-CM | POA: Diagnosis not present

## 2015-05-06 DIAGNOSIS — Z9049 Acquired absence of other specified parts of digestive tract: Secondary | ICD-10-CM | POA: Insufficient documentation

## 2015-05-06 LAB — CBC WITH DIFFERENTIAL/PLATELET
BASO%: 0.4 % (ref 0.0–2.0)
Basophils Absolute: 0 10*3/uL (ref 0.0–0.1)
EOS%: 3 % (ref 0.0–7.0)
Eosinophils Absolute: 0.2 10*3/uL (ref 0.0–0.5)
HCT: 43.3 % (ref 34.8–46.6)
HGB: 14.2 g/dL (ref 11.6–15.9)
LYMPH%: 27.2 % (ref 14.0–49.7)
MCH: 33 pg (ref 25.1–34.0)
MCHC: 32.8 g/dL (ref 31.5–36.0)
MCV: 100.7 fL (ref 79.5–101.0)
MONO#: 0.6 10*3/uL (ref 0.1–0.9)
MONO%: 12.7 % (ref 0.0–14.0)
NEUT#: 2.9 10*3/uL (ref 1.5–6.5)
NEUT%: 56.7 % (ref 38.4–76.8)
Platelets: 217 10*3/uL (ref 145–400)
RBC: 4.3 10*6/uL (ref 3.70–5.45)
RDW: 14.2 % (ref 11.2–14.5)
WBC: 5 10*3/uL (ref 3.9–10.3)
lymph#: 1.4 10*3/uL (ref 0.9–3.3)

## 2015-05-06 LAB — COMPREHENSIVE METABOLIC PANEL
ALT: 19 U/L (ref 0–55)
AST: 26 U/L (ref 5–34)
Albumin: 4 g/dL (ref 3.5–5.0)
Alkaline Phosphatase: 73 U/L (ref 40–150)
Anion Gap: 9 mEq/L (ref 3–11)
BUN: 19.8 mg/dL (ref 7.0–26.0)
CO2: 32 mEq/L — ABNORMAL HIGH (ref 22–29)
Calcium: 9.6 mg/dL (ref 8.4–10.4)
Chloride: 99 mEq/L (ref 98–109)
Creatinine: 0.9 mg/dL (ref 0.6–1.1)
EGFR: 61 mL/min/{1.73_m2} — ABNORMAL LOW (ref >90)
Glucose: 97 mg/dl (ref 70–140)
Potassium: 4.2 mEq/L (ref 3.5–5.1)
Sodium: 140 mEq/L (ref 136–145)
Total Bilirubin: 0.82 mg/dL (ref 0.20–1.20)
Total Protein: 7.3 g/dL (ref 6.4–8.3)

## 2015-05-06 MED ORDER — IOPAMIDOL (ISOVUE-300) INJECTION 61%
100.0000 mL | Freq: Once | INTRAVENOUS | Status: AC | PRN
  Administered 2015-05-06: 100 mL via INTRAVENOUS

## 2015-05-07 LAB — CEA: CEA: 2.4 ng/mL (ref 0.0–4.7)

## 2015-05-07 LAB — CEA (PARALLEL TESTING): CEA: 1.9 ng/mL

## 2015-05-08 ENCOUNTER — Ambulatory Visit (HOSPITAL_BASED_OUTPATIENT_CLINIC_OR_DEPARTMENT_OTHER): Payer: Medicare Other | Admitting: Oncology

## 2015-05-08 ENCOUNTER — Telehealth: Payer: Self-pay | Admitting: Oncology

## 2015-05-08 VITALS — BP 146/49 | HR 80 | Temp 97.6°F | Resp 18 | Ht 62.0 in | Wt 187.1 lb

## 2015-05-08 DIAGNOSIS — C182 Malignant neoplasm of ascending colon: Secondary | ICD-10-CM

## 2015-05-08 DIAGNOSIS — Z85038 Personal history of other malignant neoplasm of large intestine: Secondary | ICD-10-CM

## 2015-05-08 NOTE — Progress Notes (Signed)
Hematology and Oncology Follow Up Visit  Gina [name] [id number] 1931/02/22 82 y.o. 05/08/2015 10:33 AM    Principle Diagnosis: 82 year old woman T3 N0 (0 of 19 lymph nodes) poorly-differentiated adenocarcinoma of the ascending colon. Diagnosed in 01/2011.   Prior Therapy: Status post laparoscopic-assisted right hemicolectomy on 01/21/2011.  Current therapy: Observation and follow up.   Interim History:  Gina [name] presents today for a follow up visit by her self. Since the last visit, she reports no major changes in her health. She continues to be reasonably active and independent. She is not able to drive because of macular degeneration. She does take a train to the [place] area to be with her family on occasions. She has not reported any major changes in her bowel habits. She does report occasional loose bowel movements but without any blood or mucus. She has not reported any abdominal pain. She underwent a colonoscopy in December 2015 without any incident and this will be repeated in 2020.  She has not reported any headaches or blurry vision or double vision. She does not report any fevers, chills or sweats. Her appetite is excellent and have gained weight. She has not reported any chest pain or shortness of breath. Has not reported any frequency urgency or hesitancy. Has not reported any musca skeletal complaints. Rest of the review of system is unremarkable.  Medications: I have reviewed the patient's current medications. Current Outpatient Prescriptions  Medication Sig Dispense Refill  . acetaminophen (TYLENOL) 500 MG tablet Take 500 mg by mouth every 6 (six) hours as needed. For pain    . allopurinol (ZYLOPRIM) 100 MG tablet Take 1 tablet (100 mg total) by mouth daily. 90 tablet 3  . amLODipine (NORVASC) 5 MG tablet Take 1 tablet (5 mg total) by mouth daily. 90 tablet 3  . Ascorbic Acid (VITAMIN C) 100 MG tablet Take 500 mg by mouth daily.     [name] atorvastatin (LIPITOR) 40  MG tablet Take 1 tablet (40 mg total) by mouth daily. 90 tablet 3  . Calcium Carbonate (CALCIUM 600 PO) Take 600 mg by mouth 2 (two) times daily.     [name] Clocortolone Pivalate (CLODERM) 0.1 % cream Apply 1 application topically daily.    . clopidogrel (PLAVIX) 75 MG tablet Take 1 tablet (75 mg total) by mouth daily. 90 tablet 3  . enalapril (VASOTEC) 10 MG tablet Take 1 tablet (10 mg total) by mouth daily. 90 tablet 3  . hydrochlorothiazide (MICROZIDE) 12.5 MG capsule Take 1 capsule (12.5 mg total) by mouth daily. 90 capsule 3  . Loratadine 10 MG CAPS Take 10 mg by mouth daily.     . MULTIPLE VITAMIN PO Take 1 tablet by mouth daily.     . Multiple Vitamins-Minerals (OCUVITE PRESERVISION) TABS Take 1 tablet by mouth 2 (two) times daily.     . pramoxine-hydrocortisone (PROCTOCREAM-HC) 1-1 % rectal cream Place 1 application rectally 2 (two) times daily. 30 g 1   No current facility-administered medications for this visit.    Allergies:  Allergies  Allergen Reactions  . Aspirin     REACTION: Hives  . Penicillins     REACTION: Hives    Past Medical History, Surgical history, Social history, and Family History were reviewed and updated.   Physical Exam: Blood pressure 146/49, pulse 80, temperature 97.6 F (36.4 C), temperature source Oral, resp. rate 18, height '5\' 2"'$  (1.575 m), weight 187 lb 1.6 oz (84.868 kg), SpO2 95 %. ECOG: 1 General appearance: alert  awake. Appeared without distress. Head: Normocephalic, without obvious abnormality oral mucosa without thrush. Neck: no adenopathy Lymph nodes: Cervical, supraclavicular, and axillary nodes normal. Heart:regular rate and rhythm, S1, S2 normal, no murmur, click, rub or gallop Lung:chest clear, no wheezing, rales, normal symmetric air entry Abdomin: soft, non-tender, without masses or organomegaly no rebound or guarding. EXT:no erythema, induration, or nodules   Lab Results: Lab Results  Component Value Date   WBC 5.0 05/06/2015    HGB 14.2 05/06/2015   HCT 43.3 05/06/2015   MCV 100.7 05/06/2015   PLT 217 05/06/2015     Chemistry      Component Value Date/Time   NA 140 05/06/2015 1049   NA 140 09/09/2014 1055   NA 143 05/30/2011 1150   K 4.2 05/06/2015 1049   K 4.1 09/09/2014 1055   K 3.8 05/30/2011 1150   CL 100 09/09/2014 1055   CL 101 06/20/2012 0922   CL 97* 05/30/2011 1150   CO2 32* 05/06/2015 1049   CO2 32 09/09/2014 1055   CO2 30 05/30/2011 1150   BUN 19.8 05/06/2015 1049   BUN 19 09/09/2014 1055   BUN 18 05/30/2011 1150   CREATININE 0.9 05/06/2015 1049   CREATININE 0.83 09/09/2014 1055   CREATININE 0.8 05/30/2011 1150      Component Value Date/Time   CALCIUM 9.6 05/06/2015 1049   CALCIUM 9.7 09/09/2014 1055   CALCIUM 9.6 05/30/2011 1150   ALKPHOS 73 05/06/2015 1049   ALKPHOS 71 09/09/2014 1055   ALKPHOS 78 05/30/2011 1150   AST 26 05/06/2015 1049   AST 26 09/09/2014 1055   AST 28 05/30/2011 1150   ALT 19 05/06/2015 1049   ALT 18 09/09/2014 1055   ALT 26 05/30/2011 1150   BILITOT 0.82 05/06/2015 1049   BILITOT 0.8 09/09/2014 1055   BILITOT 0.90 05/30/2011 1150     Results for Gina [name], Gina [name] (MRN [id number]) as of 05/08/2015 10:13  Ref. Range 05/06/2015 10:49  CEA Latest Units: ng/mL 1.9   EXAM: CT CHEST, ABDOMEN, AND PELVIS WITH CONTRAST  TECHNIQUE: Multidetector CT imaging of the chest, abdomen and pelvis was performed following the standard protocol during bolus administration of intravenous contrast.  CONTRAST: 123m ISOVUE-300 IOPAMIDOL (ISOVUE-300) INJECTION 61%  COMPARISON: CT abdomen pelvis dated 05/07/2014. CT chest abdomen pelvis dated 06/11/2013.  FINDINGS: CT CHEST FINDINGS  Mediastinum/Nodes: Heart is normal in size. No pericardial effusion.  Three vessel coronary atherosclerosis.  Atherosclerotic calcifications of the aortic arch.  No suspicious mediastinal lymphadenopathy.  Visualized thyroid is unremarkable.  Lungs/Pleura: Lungs are  essentially clear.  No suspicious pulmonary nodules. Stable subpleural nodular scarring in the left lower lobe (series 4/image 36). Stable 3 mm subpleural nodule in the right lower lobe (series 4/image 25).  Mild lingular scarring. No focal consolidation.  No pleural effusion or pneumothorax.  Musculoskeletal: Degenerative changes of the thoracic spine.  CT ABDOMEN PELVIS FINDINGS  Hepatobiliary: The liver is within normal limits. No suspicious/enhancing hepatic lesions.  Gallbladder is unremarkable. No intrahepatic or extrahepatic ductal dilatation.  Pancreas: Within normal limits.  Spleen: Within normal limits.  Adrenals/Urinary Tract: Adrenal glands are within normal limits.  Small bilateral renal cysts, measuring up to 1.6 cm along the right upper pole (series 2/ image 58). No hydronephrosis.  Bladder is within normal limits.  Stomach/Bowel: Stomach is within normal limits.  Status post right hemicolectomy.  Extensive sigmoid diverticulosis, without evidence of diverticulitis.  No evidence of bowel obstruction.  Vascular/Lymphatic: Atherosclerotic calcifications of the abdominal aorta and  branch vessels. No evidence of abdominal aortic aneurysm.  No suspicious abdominopelvic lymphadenopathy.  Reproductive: Uterus is unremarkable.  Bilateral ovaries are within normal limits.  Other: No abdominopelvic ascites.  Musculoskeletal: Degenerative changes of the lumbar spine.  IMPRESSION: Status post right hemicolectomy.  No evidence of recurrent or metastatic disease.   Impression and Plan:   82 year old woman with:  1. T3 N0 poorly-differentiated adenocarcinoma of the ascending colon. She is status post laparoscopic-assisted right hemicolectomy on 01/21/2011. She is currently on active surveillance without any evidence of relapse.  Her CT scan and labs from  05/06/2015 reviewed today and showed no evidence to suggest cancer  relapse.  She is doing well without any clinical signs or symptoms of relapse.   The plan is to continue with active surveillance and repeat laboratory testing and physical examination and 12 months. She will no longer need any imaging studies unless he develops symptoms. After her visit in 12 months she will not require any oncology follow-up at that time.  2. Colonoscopy screen: She is up to date with her last one on December 2015. This will be repeated in 2020.  3. Loose bowel movements: This appears to be under reasonable control for the time being. She takes Imodium as needed.  4. Follow-up: Will be in 12 months.     [facility], MD 3/24/201710:33 AM

## 2015-05-08 NOTE — Telephone Encounter (Signed)
per pof to sch pt appt-gave pt copy of avs °

## 2015-07-14 DIAGNOSIS — M4316 Spondylolisthesis, lumbar region: Secondary | ICD-10-CM | POA: Diagnosis not present

## 2015-07-14 DIAGNOSIS — M549 Dorsalgia, unspecified: Secondary | ICD-10-CM | POA: Diagnosis not present

## 2015-07-14 DIAGNOSIS — M545 Low back pain: Secondary | ICD-10-CM | POA: Diagnosis not present

## 2015-07-14 DIAGNOSIS — M546 Pain in thoracic spine: Secondary | ICD-10-CM | POA: Diagnosis not present

## 2015-07-14 DIAGNOSIS — M47816 Spondylosis without myelopathy or radiculopathy, lumbar region: Secondary | ICD-10-CM | POA: Diagnosis not present

## 2015-07-14 DIAGNOSIS — M5136 Other intervertebral disc degeneration, lumbar region: Secondary | ICD-10-CM | POA: Diagnosis not present

## 2015-07-14 DIAGNOSIS — M4155 Other secondary scoliosis, thoracolumbar region: Secondary | ICD-10-CM | POA: Diagnosis not present

## 2015-07-29 IMAGING — CT CT ABD-PELV W/ CM
2 of 5 series · 16 of 46 positions shown, 18 images · IV contrast (OMNIPAQUE)
Comparison: 06/11/2013

CLINICAL DATA: Colon cancer restaging. Diagnosed [DATE]. Surgery
only. Hypertension. Hyperlipidemia.

EXAM:
CT ABDOMEN AND PELVIS WITH CONTRAST
TECHNIQUE: Multidetector CT imaging of the abdomen and pelvis was performed
using the standard protocol following bolus administration of
intravenous contrast.
CONTRAST:  100mL OMNIPAQUE IOHEXOL 300 MG/ML  SOLN

[Series 2: rtn a/p with · axial · 0.79mm/px · z∈[-372,-32]mm · 13 of 78 slices shown, 15 images]
[im 5/78  soft-tissue]
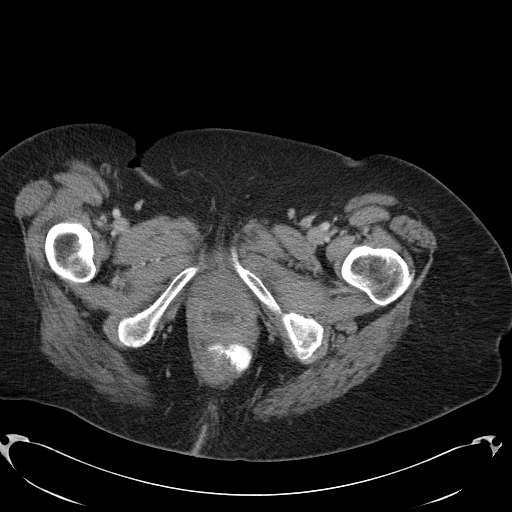
[im 5/78  bone]
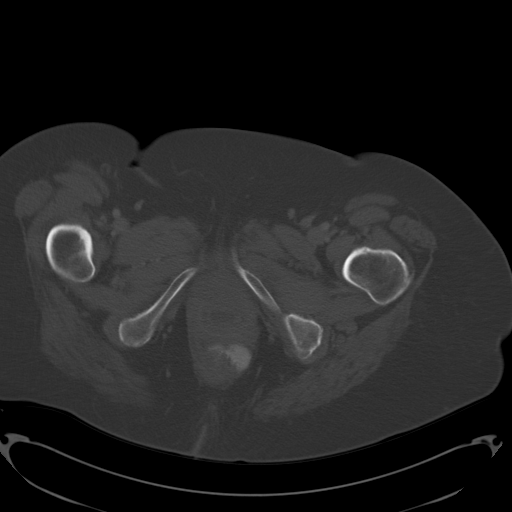
[im 9/78  soft-tissue]
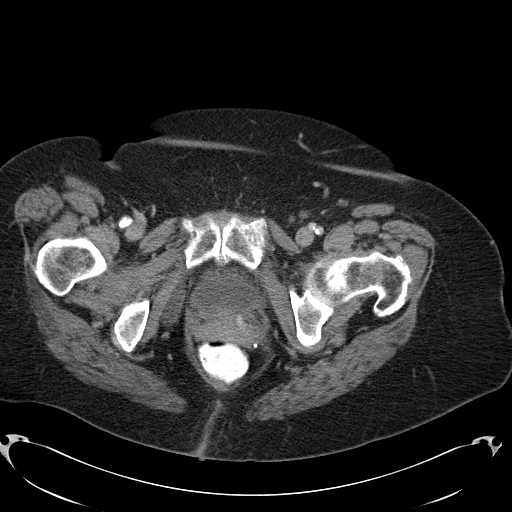
[im 18/78  soft-tissue]
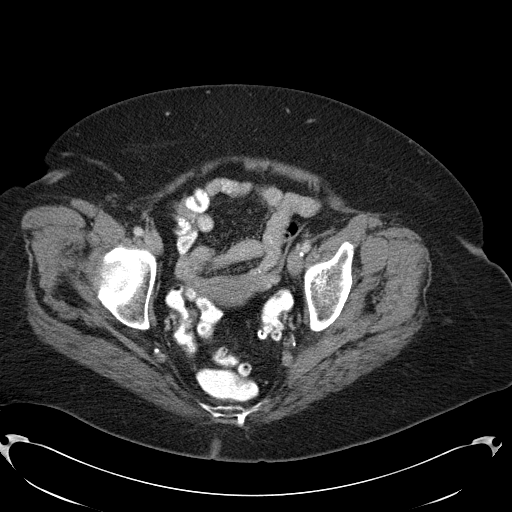
[im 22/78  soft-tissue]
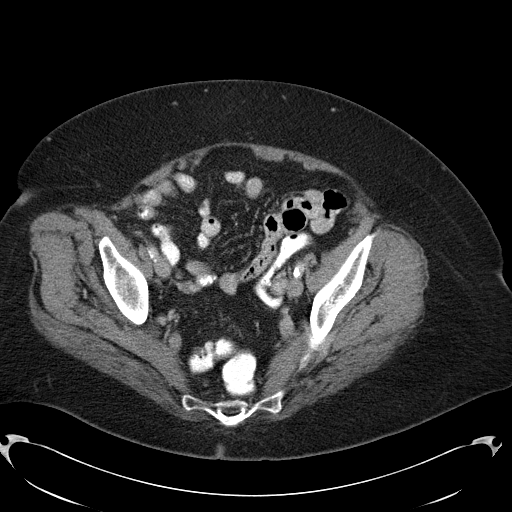
[im 26/78  soft-tissue]
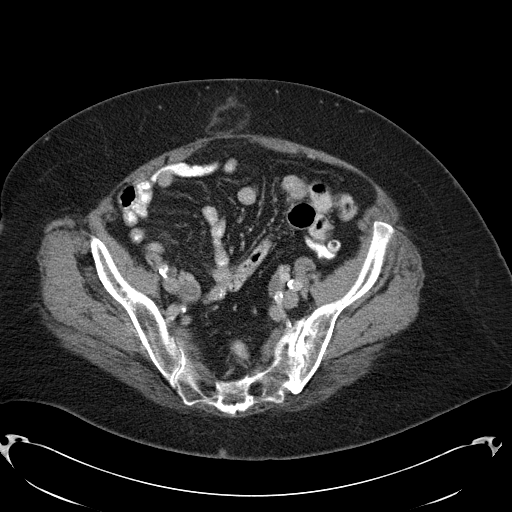
[im 35/78  soft-tissue]
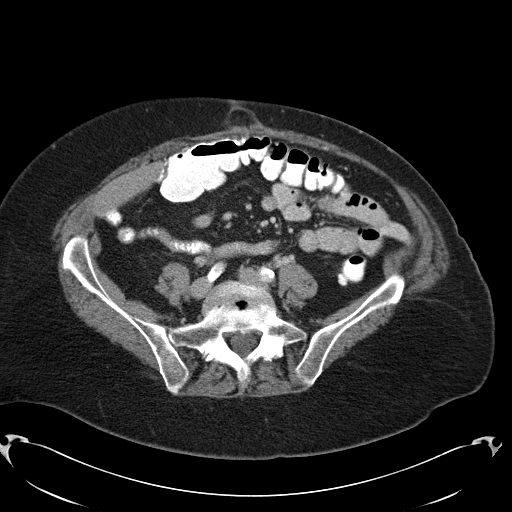
[im 39/78  soft-tissue]
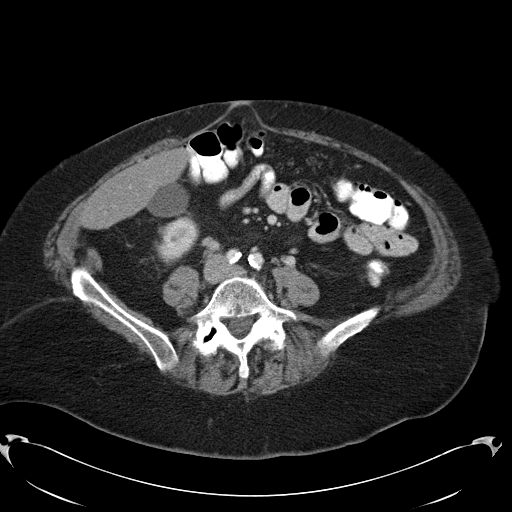
[im 43/78  soft-tissue]
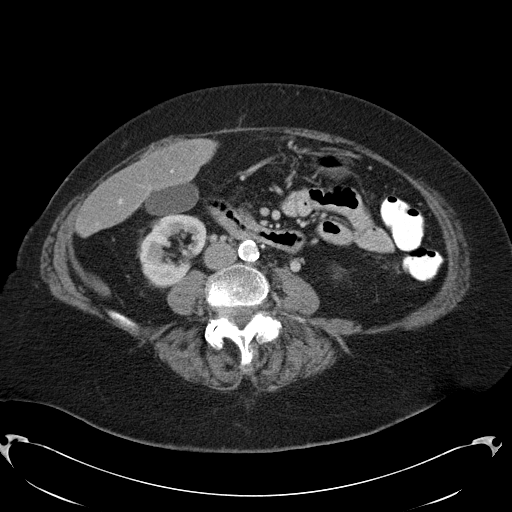
[im 52/78  soft-tissue]
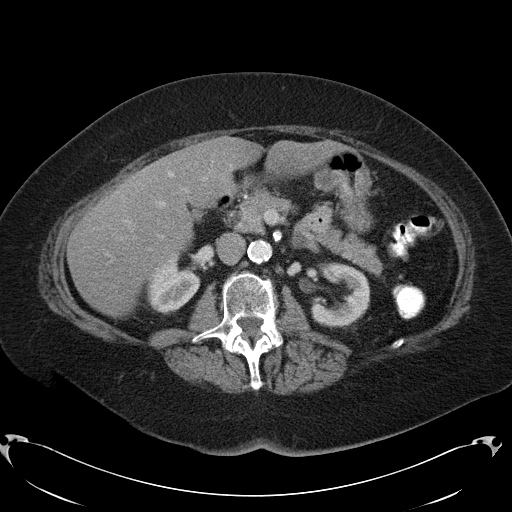
[im 52/78  bone]
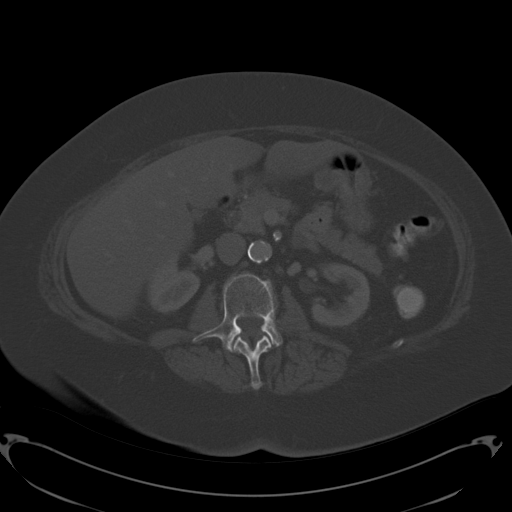
[im 56/78  soft-tissue]
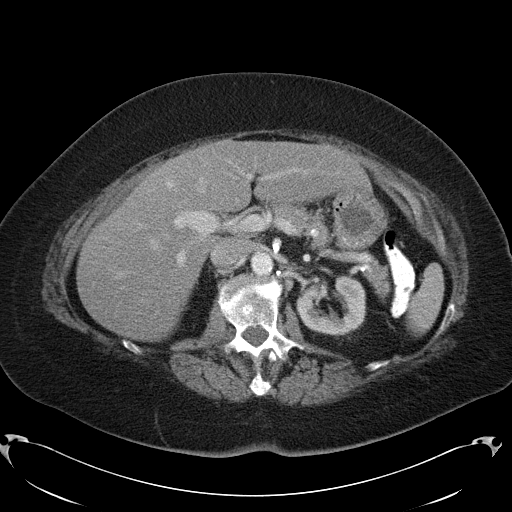
[im 60/78  soft-tissue]
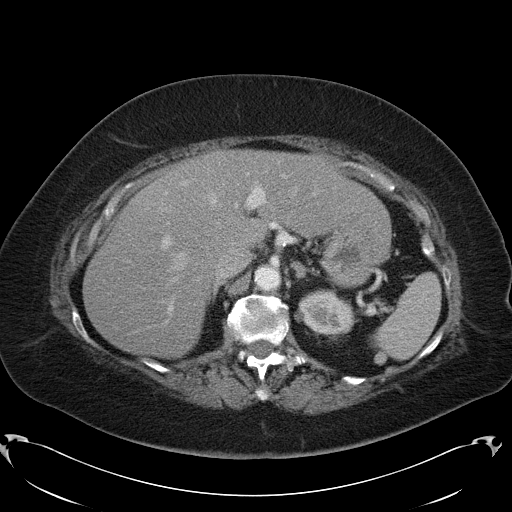
[im 69/78  soft-tissue]
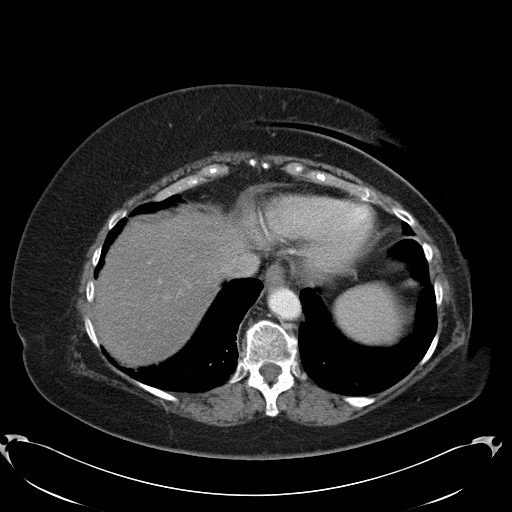
[im 73/78  soft-tissue]
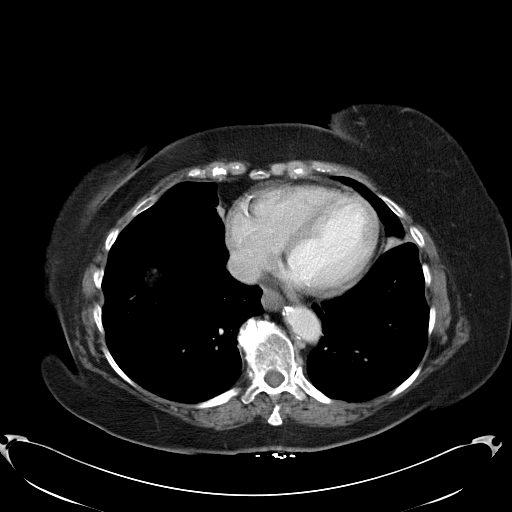

[Series 602: <mpr thick range> · coronal · 0.79mm/px · 3 of 89 slices shown]
[im 30/89  soft-tissue]
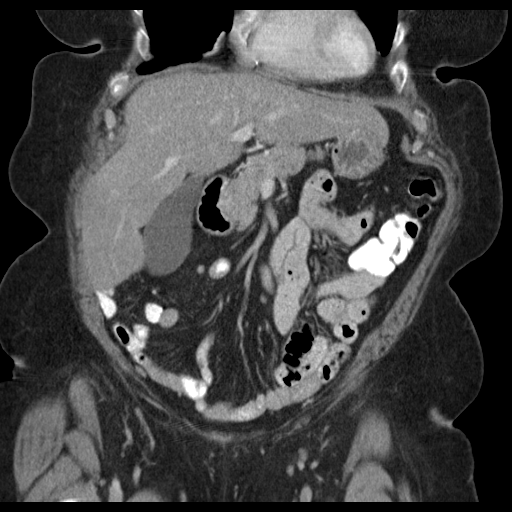
[im 40/89  soft-tissue]
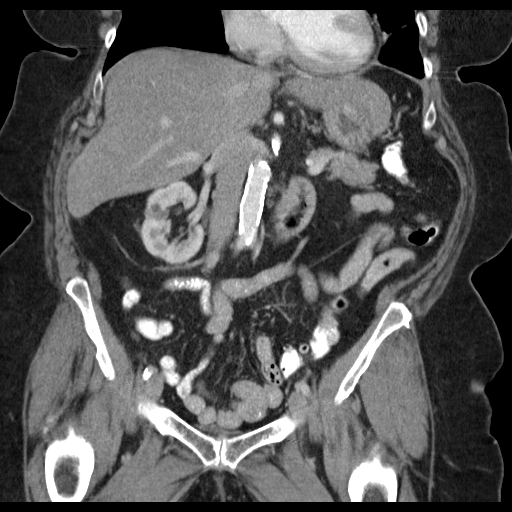
[im 49/89  soft-tissue]
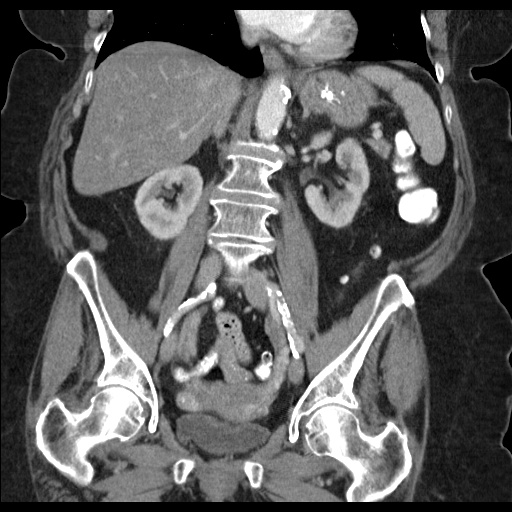

[16 of 46 positions shown; findings below may reference images not displayed]

FINDINGS: Lower chest: Lingular volume loss and scarring. No change in a vague
5 mm left lower lobe subpleural pulmonary nodule, benign. Mild
cardiomegaly with coronary artery atherosclerosis. No pericardial or
pleural effusion.

Hepatobiliary: Mild hepatic steatosis, without focal liver lesion.
Normal gallbladder, without biliary ductal dilatation.

Pancreas: Normal, without mass or ductal dilatation.

Spleen: Normal

Adrenals/Urinary Tract: Normal adrenal glands. Bilateral too small
to characterize renal lesions. Interpolar right renal cyst. 3 mm
left renal collecting system calculus. No hydronephrosis. Pelvic
floor laxity. Mild cystocele.

Stomach/Bowel: Normal stomach, without wall thickening. Extensive
colonic diverticulosis. Partial right hemicolectomy. Normal small
bowel.

Vascular/Lymphatic: Advanced aortic and branch vessel
atherosclerosis. No abdominopelvic adenopathy.

Reproductive: Normal appearance of the uterus, other than pelvic
floor laxity. No adnexal mass.

Other: No significant free fluid. No evidence of omental or
peritoneal disease. Areas of anterior abdominal and pelvic wall
laxity containing fat. The abdominal wall laxity contains
nonobstructive transverse colon.

Musculoskeletal: Bone island in the right ischium. Degenerative disc
disease at L4-5 and L5-S1. Trace L4-5 anterolisthesis is likely
degenerative.
IMPRESSION: 1. Partial right hemicolectomy, without recurrent or metastatic
disease.
2. Mild hepatic steatosis.
3. Pelvic floor laxity with a cystocele.

## 2015-08-06 ENCOUNTER — Other Ambulatory Visit (INDEPENDENT_AMBULATORY_CARE_PROVIDER_SITE_OTHER): Payer: Medicare Other

## 2015-08-06 DIAGNOSIS — I10 Essential (primary) hypertension: Secondary | ICD-10-CM | POA: Diagnosis not present

## 2015-08-06 DIAGNOSIS — M8580 Other specified disorders of bone density and structure, unspecified site: Secondary | ICD-10-CM

## 2015-08-06 DIAGNOSIS — Z0000 Encounter for general adult medical examination without abnormal findings: Secondary | ICD-10-CM | POA: Diagnosis not present

## 2015-08-06 DIAGNOSIS — I872 Venous insufficiency (chronic) (peripheral): Secondary | ICD-10-CM

## 2015-08-06 DIAGNOSIS — C182 Malignant neoplasm of ascending colon: Secondary | ICD-10-CM

## 2015-08-06 DIAGNOSIS — M109 Gout, unspecified: Secondary | ICD-10-CM

## 2015-08-06 DIAGNOSIS — E785 Hyperlipidemia, unspecified: Secondary | ICD-10-CM

## 2015-08-06 DIAGNOSIS — R202 Paresthesia of skin: Secondary | ICD-10-CM

## 2015-08-06 LAB — BASIC METABOLIC PANEL
BUN: 23 mg/dL (ref 6–23)
CO2: 34 mEq/L — ABNORMAL HIGH (ref 19–32)
Calcium: 9.6 mg/dL (ref 8.4–10.5)
Chloride: 100 mEq/L (ref 96–112)
Creatinine, Ser: 0.83 mg/dL (ref 0.40–1.20)
GFR: 69.93 mL/min (ref >60.00)
Glucose, Bld: 107 mg/dL — ABNORMAL HIGH (ref 70–99)
Potassium: 4.3 mEq/L (ref 3.5–5.1)
Sodium: 136 mEq/L (ref 135–145)

## 2015-08-06 LAB — HEPATIC FUNCTION PANEL
ALT: 17 U/L (ref 0–35)
AST: 20 U/L (ref 0–37)
Albumin: 4.2 g/dL (ref 3.5–5.2)
Alkaline Phosphatase: 65 U/L (ref 39–117)
Bilirubin, Direct: 0.1 mg/dL (ref 0.0–0.3)
Total Bilirubin: 0.9 mg/dL (ref 0.2–1.2)
Total Protein: 7 g/dL (ref 6.0–8.3)

## 2015-08-06 LAB — LIPID PANEL
Cholesterol: 152 mg/dL (ref 0–200)
HDL: 56.9 mg/dL (ref >39.00)
LDL Cholesterol: 66 mg/dL (ref 0–99)
NonHDL: 95.36
Total CHOL/HDL Ratio: 3
Triglycerides: 148 mg/dL (ref 0.0–149.0)
VLDL: 29.6 mg/dL (ref 0.0–40.0)

## 2015-08-06 LAB — TSH: TSH: 2.26 u[IU]/mL (ref 0.35–4.50)

## 2015-08-06 LAB — VITAMIN B12: Vitamin B-12: 338 pg/mL (ref 211–911)

## 2015-08-10 ENCOUNTER — Other Ambulatory Visit (INDEPENDENT_AMBULATORY_CARE_PROVIDER_SITE_OTHER): Payer: Medicare Other

## 2015-08-10 ENCOUNTER — Encounter: Payer: Self-pay | Admitting: Internal Medicine

## 2015-08-10 ENCOUNTER — Ambulatory Visit (INDEPENDENT_AMBULATORY_CARE_PROVIDER_SITE_OTHER): Payer: Medicare Other | Admitting: Internal Medicine

## 2015-08-10 VITALS — BP 140/68 | HR 82 | Wt 186.0 lb

## 2015-08-10 DIAGNOSIS — E785 Hyperlipidemia, unspecified: Secondary | ICD-10-CM

## 2015-08-10 DIAGNOSIS — I872 Venous insufficiency (chronic) (peripheral): Secondary | ICD-10-CM | POA: Diagnosis not present

## 2015-08-10 DIAGNOSIS — M8580 Other specified disorders of bone density and structure, unspecified site: Secondary | ICD-10-CM

## 2015-08-10 DIAGNOSIS — IMO0001 Reserved for inherently not codable concepts without codable children: Secondary | ICD-10-CM

## 2015-08-10 DIAGNOSIS — C189 Malignant neoplasm of colon, unspecified: Secondary | ICD-10-CM

## 2015-08-10 DIAGNOSIS — Z0000 Encounter for general adult medical examination without abnormal findings: Secondary | ICD-10-CM

## 2015-08-10 DIAGNOSIS — I10 Essential (primary) hypertension: Secondary | ICD-10-CM | POA: Diagnosis not present

## 2015-08-10 DIAGNOSIS — Z23 Encounter for immunization: Secondary | ICD-10-CM | POA: Diagnosis not present

## 2015-08-10 DIAGNOSIS — M5440 Lumbago with sciatica, unspecified side: Secondary | ICD-10-CM | POA: Diagnosis not present

## 2015-08-10 DIAGNOSIS — M109 Gout, unspecified: Secondary | ICD-10-CM

## 2015-08-10 DIAGNOSIS — C182 Malignant neoplasm of ascending colon: Secondary | ICD-10-CM | POA: Diagnosis not present

## 2015-08-10 LAB — URINALYSIS
Bilirubin Urine: NEGATIVE
Hgb urine dipstick: NEGATIVE
Ketones, ur: NEGATIVE
Leukocytes, UA: NEGATIVE
Nitrite: NEGATIVE
Specific Gravity, Urine: 1.01 (ref 1.000–1.030)
Total Protein, Urine: NEGATIVE
Urine Glucose: NEGATIVE
Urobilinogen, UA: 0.2 (ref 0.0–1.0)
pH: 6 (ref 5.0–8.0)

## 2015-08-10 MED ORDER — ALLOPURINOL 100 MG PO TABS: 100.0000 mg | ORAL_TABLET | Freq: Every day | ORAL | Status: DC

## 2015-08-10 MED ORDER — AMLODIPINE BESYLATE 5 MG PO TABS: 5.0000 mg | ORAL_TABLET | Freq: Every day | ORAL | Status: DC

## 2015-08-10 MED ORDER — HYDROCHLOROTHIAZIDE 12.5 MG PO CAPS: 12.5000 mg | ORAL_CAPSULE | Freq: Every day | ORAL | Status: DC

## 2015-08-10 MED ORDER — ENALAPRIL MALEATE 10 MG PO TABS: 10.0000 mg | ORAL_TABLET | Freq: Every day | ORAL | Status: DC

## 2015-08-10 MED ORDER — CLOPIDOGREL BISULFATE 75 MG PO TABS: 75.0000 mg | ORAL_TABLET | Freq: Every day | ORAL | Status: DC

## 2015-08-10 MED ORDER — ATORVASTATIN CALCIUM 40 MG PO TABS: 40.0000 mg | ORAL_TABLET | Freq: Every day | ORAL | Status: DC

## 2015-08-10 NOTE — Assessment & Plan Note (Signed)
Atorvastatin 40 mg

## 2015-08-10 NOTE — Assessment & Plan Note (Signed)
the pt saw Dr Rita Ohara, had X rays Doing OK now

## 2015-08-10 NOTE — Assessment & Plan Note (Signed)
Norvasc, Enalapril, HCTZ 

## 2015-08-10 NOTE — Assessment & Plan Note (Signed)
On Allopurinol No recent gout attacks

## 2015-08-10 NOTE — Progress Notes (Signed)
Subjective:  Patient ID: Gina [name], female    DOB: [birth year]  Age: 82 y.o. MRN: [id number]  CC: No chief complaint on file.   HPI LEYNA VANDERKOLK presents for a 6 mo f/u on HTN,dyslipidemia, gout f/u. F/u LBP - the pt saw Dr Rita Ohara, had X rays  Outpatient Prescriptions Prior to Visit  Medication Sig Dispense Refill  . acetaminophen (TYLENOL) 500 MG tablet Take 500 mg by mouth every 6 (six) hours as needed. For pain    . allopurinol (ZYLOPRIM) 100 MG tablet Take 1 tablet (100 mg total) by mouth daily. 90 tablet 3  . amLODipine (NORVASC) 5 MG tablet Take 1 tablet (5 mg total) by mouth daily. 90 tablet 3  . Ascorbic Acid (VITAMIN C) 100 MG tablet Take 500 mg by mouth daily.     [name] atorvastatin (LIPITOR) 40 MG tablet Take 1 tablet (40 mg total) by mouth daily. 90 tablet 3  . Calcium Carbonate (CALCIUM 600 PO) Take 600 mg by mouth 2 (two) times daily.     [name] Clocortolone Pivalate (CLODERM) 0.1 % cream Apply 1 application topically daily.    . clopidogrel (PLAVIX) 75 MG tablet Take 1 tablet (75 mg total) by mouth daily. 90 tablet 3  . enalapril (VASOTEC) 10 MG tablet Take 1 tablet (10 mg total) by mouth daily. 90 tablet 3  . hydrochlorothiazide (MICROZIDE) 12.5 MG capsule Take 1 capsule (12.5 mg total) by mouth daily. 90 capsule 3  . Loratadine 10 MG CAPS Take 10 mg by mouth daily.     . MULTIPLE VITAMIN PO Take 1 tablet by mouth daily.     . Multiple Vitamins-Minerals (OCUVITE PRESERVISION) TABS Take 1 tablet by mouth 2 (two) times daily.     . pramoxine-hydrocortisone (PROCTOCREAM-HC) 1-1 % rectal cream Place 1 application rectally 2 (two) times daily. 30 g 1   No facility-administered medications prior to visit.    ROS Review of Systems  Constitutional: Negative for chills, activity change, appetite change, fatigue and unexpected weight change.  HENT: Negative for congestion, mouth sores and sinus pressure.   Eyes: Negative for visual disturbance.  Respiratory: Negative  for cough and chest tightness.   Gastrointestinal: Negative for nausea and abdominal pain.  Genitourinary: Negative for frequency, difficulty urinating and vaginal pain.  Musculoskeletal: Positive for back pain. Negative for gait problem.  Skin: Negative for pallor and rash.  Neurological: Negative for dizziness, tremors, weakness, numbness and headaches.  Psychiatric/Behavioral: Negative for suicidal ideas, confusion and sleep disturbance. The patient is not nervous/anxious.     Objective:  BP 140/68 mmHg  Pulse 82  Wt 186 lb (84.369 kg)  SpO2 92%  BP Readings from Last 3 Encounters:  08/10/15 140/68  05/08/15 146/49  03/17/15 108/54    Wt Readings from Last 3 Encounters:  08/10/15 186 lb (84.369 kg)  05/08/15 187 lb 1.6 oz (84.868 kg)  03/17/15 186 lb (84.369 kg)    Physical Exam  Constitutional: She appears well-developed. No distress.  HENT:  Head: Normocephalic.  Right Ear: External ear normal.  Left Ear: External ear normal.  Nose: Nose normal.  Mouth/Throat: Oropharynx is clear and moist.  Eyes: Conjunctivae are normal. Pupils are equal, round, and reactive to light. Right eye exhibits no discharge. Left eye exhibits no discharge.  Neck: Normal range of motion. Neck supple. No JVD present. No tracheal deviation present. No thyromegaly present.  Cardiovascular: Normal rate, regular rhythm and normal heart sounds.   Pulmonary/Chest: No stridor. No respiratory  distress. She has no wheezes.  Abdominal: Soft. Bowel sounds are normal. She exhibits no distension and no mass. There is no tenderness. There is no rebound and no guarding.  Musculoskeletal: She exhibits no edema or tenderness.  Lymphadenopathy:    She has no cervical adenopathy.  Neurological: She displays normal reflexes. No cranial nerve deficit. She exhibits normal muscle tone. Coordination normal.  Skin: No rash noted. No erythema.  Psychiatric: She has a normal mood and affect. Her behavior is normal.  Judgment and thought content normal.  No LS spine tenderness now  Lab Results  Component Value Date   WBC 5.0 05/06/2015   HGB 14.2 05/06/2015   HCT 43.3 05/06/2015   PLT 217 05/06/2015   GLUCOSE 107* 08/06/2015   CHOL 152 08/06/2015   TRIG 148.0 08/06/2015   HDL 56.90 08/06/2015   LDLDIRECT 69.0 09/09/2014   LDLCALC 66 08/06/2015   ALT 17 08/06/2015   AST 20 08/06/2015   NA 136 08/06/2015   K 4.3 08/06/2015   CL 100 08/06/2015   CREATININE 0.83 08/06/2015   BUN 23 08/06/2015   CO2 34* 08/06/2015   TSH 2.26 08/06/2015   INR 0.94 06/07/2010   HGBA1C * 11/12/2009    5.7 (NOTE)                                                                       According to the ADA Clinical Practice Recommendations for 2011, when HbA1c is used as a screening test:   >=6.5%   Diagnostic of Diabetes Mellitus           (if abnormal result  is confirmed)  5.7-6.4%   Increased risk of developing Diabetes Mellitus  References:Diagnosis and Classification of Diabetes Mellitus,Diabetes SWFU,9323,55(DDUKG 1):S62-S69 and Standards of Medical Care in         Diabetes - 2011,Diabetes Care,2011,34  (Suppl 1):S11-S61.    Ct Chest W Contrast  05/06/2015  CLINICAL DATA:  Colon cancer diagnosed 01/2011, status post partial colectomy EXAM: CT CHEST, ABDOMEN, AND PELVIS WITH CONTRAST TECHNIQUE: Multidetector CT imaging of the chest, abdomen and pelvis was performed following the standard protocol during bolus administration of intravenous contrast. CONTRAST:  183m ISOVUE-300 IOPAMIDOL (ISOVUE-300) INJECTION 61% COMPARISON:  CT abdomen pelvis dated 05/07/2014. CT chest abdomen pelvis dated 06/11/2013. FINDINGS: CT CHEST FINDINGS Mediastinum/Nodes: Heart is normal in size. No pericardial effusion. Three vessel coronary atherosclerosis. Atherosclerotic calcifications of the aortic arch. No suspicious mediastinal lymphadenopathy. Visualized thyroid is unremarkable. Lungs/Pleura: Lungs are essentially clear. No suspicious  pulmonary nodules. Stable subpleural nodular scarring in the left lower lobe (series 4/image 36). Stable 3 mm subpleural nodule in the right lower lobe (series 4/image 25). Mild lingular scarring.  No focal consolidation. No pleural effusion or pneumothorax. Musculoskeletal: Degenerative changes of the thoracic spine. CT ABDOMEN PELVIS FINDINGS Hepatobiliary: The liver is within normal limits. No suspicious/enhancing hepatic lesions. Gallbladder is unremarkable. No intrahepatic or extrahepatic ductal dilatation. Pancreas: Within normal limits. Spleen:  Within normal limits. Adrenals/Urinary Tract: Adrenal glands are within normal limits. Small bilateral renal cysts, measuring up to 1.6 cm along the right upper pole (series 2/ image 58). No hydronephrosis. Bladder is within normal limits. Stomach/Bowel: Stomach is within normal limits. Status post right hemicolectomy. Extensive  sigmoid diverticulosis, without evidence of diverticulitis. No evidence of bowel obstruction. Vascular/Lymphatic: Atherosclerotic calcifications of the abdominal aorta and branch vessels. No evidence of abdominal aortic aneurysm. No suspicious abdominopelvic lymphadenopathy. Reproductive: Uterus is unremarkable. Bilateral ovaries are within normal limits. Other: No abdominopelvic ascites. Musculoskeletal: Degenerative changes of the lumbar spine. IMPRESSION: Status post right hemicolectomy. No evidence of recurrent or metastatic disease. Electronically Signed   By: Julian Hy M.D.   On: 05/06/2015 14:28   Ct Abdomen Pelvis W Contrast  05/06/2015  CLINICAL DATA:  Colon cancer diagnosed 01/2011, status post partial colectomy EXAM: CT CHEST, ABDOMEN, AND PELVIS WITH CONTRAST TECHNIQUE: Multidetector CT imaging of the chest, abdomen and pelvis was performed following the standard protocol during bolus administration of intravenous contrast. CONTRAST:  159m ISOVUE-300 IOPAMIDOL (ISOVUE-300) INJECTION 61% COMPARISON:  CT abdomen pelvis  dated 05/07/2014. CT chest abdomen pelvis dated 06/11/2013. FINDINGS: CT CHEST FINDINGS Mediastinum/Nodes: Heart is normal in size. No pericardial effusion. Three vessel coronary atherosclerosis. Atherosclerotic calcifications of the aortic arch. No suspicious mediastinal lymphadenopathy. Visualized thyroid is unremarkable. Lungs/Pleura: Lungs are essentially clear. No suspicious pulmonary nodules. Stable subpleural nodular scarring in the left lower lobe (series 4/image 36). Stable 3 mm subpleural nodule in the right lower lobe (series 4/image 25). Mild lingular scarring.  No focal consolidation. No pleural effusion or pneumothorax. Musculoskeletal: Degenerative changes of the thoracic spine. CT ABDOMEN PELVIS FINDINGS Hepatobiliary: The liver is within normal limits. No suspicious/enhancing hepatic lesions. Gallbladder is unremarkable. No intrahepatic or extrahepatic ductal dilatation. Pancreas: Within normal limits. Spleen:  Within normal limits. Adrenals/Urinary Tract: Adrenal glands are within normal limits. Small bilateral renal cysts, measuring up to 1.6 cm along the right upper pole (series 2/ image 58). No hydronephrosis. Bladder is within normal limits. Stomach/Bowel: Stomach is within normal limits. Status post right hemicolectomy. Extensive sigmoid diverticulosis, without evidence of diverticulitis. No evidence of bowel obstruction. Vascular/Lymphatic: Atherosclerotic calcifications of the abdominal aorta and branch vessels. No evidence of abdominal aortic aneurysm. No suspicious abdominopelvic lymphadenopathy. Reproductive: Uterus is unremarkable. Bilateral ovaries are within normal limits. Other: No abdominopelvic ascites. Musculoskeletal: Degenerative changes of the lumbar spine. IMPRESSION: Status post right hemicolectomy. No evidence of recurrent or metastatic disease. Electronically Signed   By: SJulian HyM.D.   On: 05/06/2015 14:28    Assessment & Plan:   Diagnoses and all orders for  this visit:  Malignant neoplasm of colon, unspecified part of colon ([place] -     clopidogrel (PLAVIX) 75 MG tablet; Take 1 tablet (75 mg total) by mouth daily.  Other orders -     atorvastatin (LIPITOR) 40 MG tablet; Take 1 tablet (40 mg total) by mouth daily. -     amLODipine (NORVASC) 5 MG tablet; Take 1 tablet (5 mg total) by mouth daily. -     allopurinol (ZYLOPRIM) 100 MG tablet; Take 1 tablet (100 mg total) by mouth daily. -     enalapril (VASOTEC) 10 MG tablet; Take 1 tablet (10 mg total) by mouth daily. -     hydrochlorothiazide (MICROZIDE) 12.5 MG capsule; Take 1 capsule (12.5 mg total) by mouth daily.   I am having Ms. Krenn maintain her Loratadine, Calcium Carbonate (CALCIUM 600 PO), vitamin C, MULTIPLE VITAMIN PO, OCUVITE PRESERVISION, acetaminophen, Clocortolone Pivalate, pramoxine-hydrocortisone, allopurinol, amLODipine, atorvastatin, clopidogrel, enalapril, and hydrochlorothiazide.  No orders of the defined types were placed in this encounter.     Follow-up: No Follow-up on file.  AWalker Kehr MD

## 2015-08-10 NOTE — Progress Notes (Signed)
Pre visit review using our clinic review tool, if applicable. No additional management support is needed unless otherwise documented below in the visit note. 

## 2015-11-23 DIAGNOSIS — Z23 Encounter for immunization: Secondary | ICD-10-CM | POA: Diagnosis not present

## 2015-12-08 DIAGNOSIS — M47816 Spondylosis without myelopathy or radiculopathy, lumbar region: Secondary | ICD-10-CM | POA: Diagnosis not present

## 2015-12-09 DIAGNOSIS — M5126 Other intervertebral disc displacement, lumbar region: Secondary | ICD-10-CM | POA: Diagnosis not present

## 2015-12-09 DIAGNOSIS — M47816 Spondylosis without myelopathy or radiculopathy, lumbar region: Secondary | ICD-10-CM | POA: Diagnosis not present

## 2015-12-14 DIAGNOSIS — M48062 Spinal stenosis, lumbar region with neurogenic claudication: Secondary | ICD-10-CM | POA: Diagnosis not present

## 2015-12-14 DIAGNOSIS — M4316 Spondylolisthesis, lumbar region: Secondary | ICD-10-CM | POA: Diagnosis not present

## 2015-12-14 DIAGNOSIS — I10 Essential (primary) hypertension: Secondary | ICD-10-CM | POA: Diagnosis not present

## 2015-12-14 DIAGNOSIS — Z6833 Body mass index (BMI) 33.0-33.9, adult: Secondary | ICD-10-CM | POA: Diagnosis not present

## 2015-12-14 DIAGNOSIS — M5416 Radiculopathy, lumbar region: Secondary | ICD-10-CM | POA: Diagnosis not present

## 2015-12-14 DIAGNOSIS — M5136 Other intervertebral disc degeneration, lumbar region: Secondary | ICD-10-CM | POA: Diagnosis not present

## 2015-12-14 DIAGNOSIS — M47816 Spondylosis without myelopathy or radiculopathy, lumbar region: Secondary | ICD-10-CM | POA: Diagnosis not present

## 2015-12-21 ENCOUNTER — Telehealth: Payer: Self-pay | Admitting: Emergency Medicine

## 2015-12-21 NOTE — Telephone Encounter (Signed)
Pt called and stated she wanted to make an appt to discuss an epidural and medication. Since [facility] didn't have an appt available this week she asked that he just give her a call. I stated he was probably not going to discuss much over the phone that she would probably need to come in next week since there were appts available. She insisted that I send a message over and ask that he call her first. Please advise thanks.

## 2015-12-22 NOTE — Telephone Encounter (Signed)
Pt calling back stating medical clearance was suppose to send from [place] [facility] [phone] yesterday. Pt was hopping to get this done ASAP so she can get her surgery schedule. Please look for the request and get it done for her.

## 2015-12-23 NOTE — Telephone Encounter (Signed)
I spoke w/Mrs [facility] yesterday.

## 2015-12-31 DIAGNOSIS — M5136 Other intervertebral disc degeneration, lumbar region: Secondary | ICD-10-CM | POA: Diagnosis not present

## 2015-12-31 DIAGNOSIS — M48061 Spinal stenosis, lumbar region without neurogenic claudication: Secondary | ICD-10-CM | POA: Diagnosis not present

## 2015-12-31 DIAGNOSIS — M5416 Radiculopathy, lumbar region: Secondary | ICD-10-CM | POA: Diagnosis not present

## 2015-12-31 DIAGNOSIS — M4726 Other spondylosis with radiculopathy, lumbar region: Secondary | ICD-10-CM | POA: Diagnosis not present

## 2016-01-12 ENCOUNTER — Telehealth: Payer: Self-pay | Admitting: Internal Medicine

## 2016-01-12 MED ORDER — CIPROFLOXACIN HCL 250 MG PO TABS: 250.0000 mg | ORAL_TABLET | Freq: Two times a day (BID) | ORAL | 0 refills | Status: DC

## 2016-01-12 NOTE — Telephone Encounter (Signed)
Pt informed

## 2016-01-12 NOTE — Telephone Encounter (Signed)
Pt called in said that she is fighting a uti and would like to know if Dr Camila Li would like something in for her today ?

## 2016-01-12 NOTE — Telephone Encounter (Signed)
Cipro Rx emailed Thx

## 2016-02-17 ENCOUNTER — Other Ambulatory Visit (INDEPENDENT_AMBULATORY_CARE_PROVIDER_SITE_OTHER): Payer: Medicare Other

## 2016-02-17 DIAGNOSIS — Z0000 Encounter for general adult medical examination without abnormal findings: Secondary | ICD-10-CM

## 2016-02-17 DIAGNOSIS — M5440 Lumbago with sciatica, unspecified side: Secondary | ICD-10-CM | POA: Diagnosis not present

## 2016-02-17 DIAGNOSIS — I10 Essential (primary) hypertension: Secondary | ICD-10-CM | POA: Diagnosis not present

## 2016-02-17 DIAGNOSIS — C189 Malignant neoplasm of colon, unspecified: Secondary | ICD-10-CM

## 2016-02-17 DIAGNOSIS — E785 Hyperlipidemia, unspecified: Secondary | ICD-10-CM

## 2016-02-17 DIAGNOSIS — M109 Gout, unspecified: Secondary | ICD-10-CM

## 2016-02-17 LAB — LIPID PANEL
Cholesterol: 156 mg/dL (ref 0–200)
HDL: 69 mg/dL (ref >39.00)
LDL Cholesterol: 60 mg/dL (ref 0–99)
NonHDL: 86.53
Total CHOL/HDL Ratio: 2
Triglycerides: 132 mg/dL (ref 0.0–149.0)
VLDL: 26.4 mg/dL (ref 0.0–40.0)

## 2016-02-17 LAB — BASIC METABOLIC PANEL
BUN: 16 mg/dL (ref 6–23)
CO2: 30 mEq/L (ref 19–32)
Calcium: 9.3 mg/dL (ref 8.4–10.5)
Chloride: 96 mEq/L (ref 96–112)
Creatinine, Ser: 0.7 mg/dL (ref 0.40–1.20)
GFR: 85.01 mL/min (ref >60.00)
Glucose, Bld: 119 mg/dL — ABNORMAL HIGH (ref 70–99)
Potassium: 3.9 mEq/L (ref 3.5–5.1)
Sodium: 136 mEq/L (ref 135–145)

## 2016-02-17 LAB — CBC WITH DIFFERENTIAL/PLATELET
Basophils Absolute: 0 10*3/uL (ref 0.0–0.1)
Basophils Relative: 0.3 % (ref 0.0–3.0)
Eosinophils Absolute: 0.1 10*3/uL (ref 0.0–0.7)
Eosinophils Relative: 1.1 % (ref 0.0–5.0)
HCT: 38.5 % (ref 36.0–46.0)
Hemoglobin: 13.4 g/dL (ref 12.0–15.0)
Lymphocytes Relative: 24.3 % (ref 12.0–46.0)
Lymphs Abs: 1.4 10*3/uL (ref 0.7–4.0)
MCHC: 34.8 g/dL (ref 30.0–36.0)
MCV: 98 fl (ref 78.0–100.0)
Monocytes Absolute: 0.6 10*3/uL (ref 0.1–1.0)
Monocytes Relative: 11.1 % (ref 3.0–12.0)
Neutro Abs: 3.6 10*3/uL (ref 1.4–7.7)
Neutrophils Relative %: 63.2 % (ref 43.0–77.0)
Platelets: 207 10*3/uL (ref 150.0–400.0)
RBC: 3.93 Mil/uL (ref 3.87–5.11)
RDW: 14.8 % (ref 11.5–15.5)
WBC: 5.7 10*3/uL (ref 4.0–10.5)

## 2016-02-17 LAB — URINALYSIS
Bilirubin Urine: NEGATIVE
Hgb urine dipstick: NEGATIVE
Ketones, ur: NEGATIVE
Leukocytes, UA: NEGATIVE
Nitrite: NEGATIVE
Specific Gravity, Urine: 1.01 (ref 1.000–1.030)
Total Protein, Urine: NEGATIVE
Urine Glucose: NEGATIVE
Urobilinogen, UA: 0.2 (ref 0.0–1.0)
pH: 6.5 (ref 5.0–8.0)

## 2016-02-17 LAB — HEPATIC FUNCTION PANEL
ALT: 18 U/L (ref 0–35)
AST: 22 U/L (ref 0–37)
Albumin: 4.3 g/dL (ref 3.5–5.2)
Alkaline Phosphatase: 63 U/L (ref 39–117)
Bilirubin, Direct: 0.1 mg/dL (ref 0.0–0.3)
Total Bilirubin: 0.9 mg/dL (ref 0.2–1.2)
Total Protein: 6.8 g/dL (ref 6.0–8.3)

## 2016-02-17 LAB — TSH: TSH: 2.26 u[IU]/mL (ref 0.35–4.50)

## 2016-02-18 ENCOUNTER — Ambulatory Visit (INDEPENDENT_AMBULATORY_CARE_PROVIDER_SITE_OTHER): Payer: Medicare Other | Admitting: Internal Medicine

## 2016-02-18 ENCOUNTER — Encounter: Payer: Self-pay | Admitting: Internal Medicine

## 2016-02-18 VITALS — BP 138/70 | HR 83 | Temp 97.2°F | Ht 62.0 in | Wt 184.4 lb

## 2016-02-18 DIAGNOSIS — I10 Essential (primary) hypertension: Secondary | ICD-10-CM | POA: Diagnosis not present

## 2016-02-18 DIAGNOSIS — M109 Gout, unspecified: Secondary | ICD-10-CM

## 2016-02-18 DIAGNOSIS — R7309 Other abnormal glucose: Secondary | ICD-10-CM | POA: Diagnosis not present

## 2016-02-18 DIAGNOSIS — C182 Malignant neoplasm of ascending colon: Secondary | ICD-10-CM

## 2016-02-18 LAB — POCT GLYCOSYLATED HEMOGLOBIN (HGB A1C): Hemoglobin A1C: 5.8

## 2016-02-18 NOTE — Addendum Note (Signed)
Addended by: Earnstine Regal on: 02/18/2016 11:30 AM   Modules accepted: Orders

## 2016-02-18 NOTE — Assessment & Plan Note (Signed)
F/u w/GI - Dr Fuller Plan

## 2016-02-18 NOTE — Assessment & Plan Note (Signed)
Allopurinol  

## 2016-02-18 NOTE — Progress Notes (Signed)
Subjective:  Patient ID: Gina [name], female    DOB: 09/13/1931  Age: 83 y.o. MRN: [id number]  CC: Follow-up (6 months)   HPI Gina [name] presents for gout, LBP - had an epidural, HTN, dyslipidemia f/u  Outpatient Medications Prior to Visit  Medication Sig Dispense Refill  . acetaminophen (TYLENOL) 500 MG tablet Take 500 mg by mouth every 6 (six) hours as needed. For pain    . allopurinol (ZYLOPRIM) 100 MG tablet Take 1 tablet (100 mg total) by mouth daily. 90 tablet 3  . amLODipine (NORVASC) 5 MG tablet Take 1 tablet (5 mg total) by mouth daily. 90 tablet 3  . Ascorbic Acid (VITAMIN C) 100 MG tablet Take 500 mg by mouth daily.     [name] atorvastatin (LIPITOR) 40 MG tablet Take 1 tablet (40 mg total) by mouth daily. 90 tablet 3  . Calcium Carbonate (CALCIUM 600 PO) Take 600 mg by mouth 2 (two) times daily.     . ciprofloxacin (CIPRO) 250 MG tablet Take 1 tablet (250 mg total) by mouth 2 (two) times daily. 8 tablet 0  . Clocortolone Pivalate (CLODERM) 0.1 % cream Apply 1 application topically daily.    . clopidogrel (PLAVIX) 75 MG tablet Take 1 tablet (75 mg total) by mouth daily. 90 tablet 3  . enalapril (VASOTEC) 10 MG tablet Take 1 tablet (10 mg total) by mouth daily. 90 tablet 3  . hydrochlorothiazide (MICROZIDE) 12.5 MG capsule Take 1 capsule (12.5 mg total) by mouth daily. 90 capsule 3  . Loratadine 10 MG CAPS Take 10 mg by mouth daily.     . MULTIPLE VITAMIN PO Take 1 tablet by mouth daily.     . Multiple Vitamins-Minerals (OCUVITE PRESERVISION) TABS Take 1 tablet by mouth 2 (two) times daily.     . pramoxine-hydrocortisone (PROCTOCREAM-HC) 1-1 % rectal cream Place 1 application rectally 2 (two) times daily. 30 g 1   No facility-administered medications prior to visit.     ROS Review of Systems  Constitutional: Negative for activity change, appetite change, chills, fatigue and unexpected weight change.  HENT: Negative for congestion, mouth sores and sinus pressure.     Eyes: Negative for visual disturbance.  Respiratory: Negative for cough and chest tightness.   Gastrointestinal: Negative for abdominal pain and nausea.  Genitourinary: Negative for difficulty urinating, frequency and vaginal pain.  Musculoskeletal: Positive for back pain. Negative for gait problem.  Skin: Negative for pallor and rash.  Neurological: Negative for dizziness, tremors, weakness, numbness and headaches.  Psychiatric/Behavioral: Negative for confusion and sleep disturbance. The patient is not nervous/anxious.     Objective:  BP 138/70 (BP Location: Left Arm)   Pulse 83   Temp 97.2 F (36.2 C) (Oral)   Ht '5\' 2"'$  (1.575 m)   Wt 184 lb 6.4 oz (83.6 kg)   SpO2 93%   BMI 33.73 kg/m   BP Readings from Last 3 Encounters:  02/18/16 138/70  08/10/15 140/68  05/08/15 (!) 146/49    Wt Readings from Last 3 Encounters:  02/18/16 184 lb 6.4 oz (83.6 kg)  08/10/15 186 lb (84.4 kg)  05/08/15 187 lb 1.6 oz (84.9 kg)    Physical Exam  Constitutional: She appears well-developed. No distress.  HENT:  Head: Normocephalic.  Right Ear: External ear normal.  Left Ear: External ear normal.  Nose: Nose normal.  Mouth/Throat: Oropharynx is clear and moist.  Eyes: Conjunctivae are normal. Pupils are equal, round, and reactive to light. Right eye exhibits  no discharge. Left eye exhibits no discharge.  Neck: Normal range of motion. Neck supple. No JVD present. No tracheal deviation present. No thyromegaly present.  Cardiovascular: Normal rate, regular rhythm and normal heart sounds.   Pulmonary/Chest: No stridor. No respiratory distress. She has no wheezes.  Abdominal: Soft. Bowel sounds are normal. She exhibits no distension and no mass. There is no tenderness. There is no rebound and no guarding.  Musculoskeletal: She exhibits tenderness. She exhibits no edema.  Lymphadenopathy:    She has no cervical adenopathy.  Neurological: She displays normal reflexes. No cranial nerve deficit.  She exhibits normal muscle tone. Coordination normal.  Skin: No rash noted. No erythema.  Psychiatric: She has a normal mood and affect. Her behavior is normal. Judgment and thought content normal.    Lab Results  Component Value Date   WBC 5.7 02/17/2016   HGB 13.4 02/17/2016   HCT 38.5 02/17/2016   PLT 207.0 02/17/2016   GLUCOSE 119 (H) 02/17/2016   CHOL 156 02/17/2016   TRIG 132.0 02/17/2016   HDL 69.00 02/17/2016   LDLDIRECT 69.0 09/09/2014   LDLCALC 60 02/17/2016   ALT 18 02/17/2016   AST 22 02/17/2016   NA 136 02/17/2016   K 3.9 02/17/2016   CL 96 02/17/2016   CREATININE 0.70 02/17/2016   BUN 16 02/17/2016   CO2 30 02/17/2016   TSH 2.26 02/17/2016   INR 0.94 06/07/2010   HGBA1C (H) 11/12/2009    5.7 (NOTE)                                                                       According to the ADA Clinical Practice Recommendations for 2011, when HbA1c is used as a screening test:   >=6.5%   Diagnostic of Diabetes Mellitus           (if abnormal result  is confirmed)  5.7-6.4%   Increased risk of developing Diabetes Mellitus  References:Diagnosis and Classification of Diabetes Mellitus,Diabetes NFAO,1308,65(HQION 1):S62-S69 and Standards of Medical Care in         Diabetes - 2011,Diabetes Care,2011,34  (Suppl 1):S11-S61.    Ct Chest W Contrast  Result Date: 05/06/2015 CLINICAL DATA:  Colon cancer diagnosed 01/2011, status post partial colectomy EXAM: CT CHEST, ABDOMEN, AND PELVIS WITH CONTRAST TECHNIQUE: Multidetector CT imaging of the chest, abdomen and pelvis was performed following the standard protocol during bolus administration of intravenous contrast. CONTRAST:  137m ISOVUE-300 IOPAMIDOL (ISOVUE-300) INJECTION 61% COMPARISON:  CT abdomen pelvis dated 05/07/2014. CT chest abdomen pelvis dated 06/11/2013. FINDINGS: CT CHEST FINDINGS Mediastinum/Nodes: Heart is normal in size. No pericardial effusion. Three vessel coronary atherosclerosis. Atherosclerotic calcifications of  the aortic arch. No suspicious mediastinal lymphadenopathy. Visualized thyroid is unremarkable. Lungs/Pleura: Lungs are essentially clear. No suspicious pulmonary nodules. Stable subpleural nodular scarring in the left lower lobe (series 4/image 36). Stable 3 mm subpleural nodule in the right lower lobe (series 4/image 25). Mild lingular scarring.  No focal consolidation. No pleural effusion or pneumothorax. Musculoskeletal: Degenerative changes of the thoracic spine. CT ABDOMEN PELVIS FINDINGS Hepatobiliary: The liver is within normal limits. No suspicious/enhancing hepatic lesions. Gallbladder is unremarkable. No intrahepatic or extrahepatic ductal dilatation. Pancreas: Within normal limits. Spleen:  Within normal limits. Adrenals/Urinary Tract: Adrenal  glands are within normal limits. Small bilateral renal cysts, measuring up to 1.6 cm along the right upper pole (series 2/ image 58). No hydronephrosis. Bladder is within normal limits. Stomach/Bowel: Stomach is within normal limits. Status post right hemicolectomy. Extensive sigmoid diverticulosis, without evidence of diverticulitis. No evidence of bowel obstruction. Vascular/Lymphatic: Atherosclerotic calcifications of the abdominal aorta and branch vessels. No evidence of abdominal aortic aneurysm. No suspicious abdominopelvic lymphadenopathy. Reproductive: Uterus is unremarkable. Bilateral ovaries are within normal limits. Other: No abdominopelvic ascites. Musculoskeletal: Degenerative changes of the lumbar spine. IMPRESSION: Status post right hemicolectomy. No evidence of recurrent or metastatic disease. Electronically Signed   By: Julian Hy M.D.   On: 05/06/2015 14:28   Ct Abdomen Pelvis W Contrast  Result Date: 05/06/2015 CLINICAL DATA:  Colon cancer diagnosed 01/2011, status post partial colectomy EXAM: CT CHEST, ABDOMEN, AND PELVIS WITH CONTRAST TECHNIQUE: Multidetector CT imaging of the chest, abdomen and pelvis was performed following the  standard protocol during bolus administration of intravenous contrast. CONTRAST:  158m ISOVUE-300 IOPAMIDOL (ISOVUE-300) INJECTION 61% COMPARISON:  CT abdomen pelvis dated 05/07/2014. CT chest abdomen pelvis dated 06/11/2013. FINDINGS: CT CHEST FINDINGS Mediastinum/Nodes: Heart is normal in size. No pericardial effusion. Three vessel coronary atherosclerosis. Atherosclerotic calcifications of the aortic arch. No suspicious mediastinal lymphadenopathy. Visualized thyroid is unremarkable. Lungs/Pleura: Lungs are essentially clear. No suspicious pulmonary nodules. Stable subpleural nodular scarring in the left lower lobe (series 4/image 36). Stable 3 mm subpleural nodule in the right lower lobe (series 4/image 25). Mild lingular scarring.  No focal consolidation. No pleural effusion or pneumothorax. Musculoskeletal: Degenerative changes of the thoracic spine. CT ABDOMEN PELVIS FINDINGS Hepatobiliary: The liver is within normal limits. No suspicious/enhancing hepatic lesions. Gallbladder is unremarkable. No intrahepatic or extrahepatic ductal dilatation. Pancreas: Within normal limits. Spleen:  Within normal limits. Adrenals/Urinary Tract: Adrenal glands are within normal limits. Small bilateral renal cysts, measuring up to 1.6 cm along the right upper pole (series 2/ image 58). No hydronephrosis. Bladder is within normal limits. Stomach/Bowel: Stomach is within normal limits. Status post right hemicolectomy. Extensive sigmoid diverticulosis, without evidence of diverticulitis. No evidence of bowel obstruction. Vascular/Lymphatic: Atherosclerotic calcifications of the abdominal aorta and branch vessels. No evidence of abdominal aortic aneurysm. No suspicious abdominopelvic lymphadenopathy. Reproductive: Uterus is unremarkable. Bilateral ovaries are within normal limits. Other: No abdominopelvic ascites. Musculoskeletal: Degenerative changes of the lumbar spine. IMPRESSION: Status post right hemicolectomy. No evidence  of recurrent or metastatic disease. Electronically Signed   By: SJulian HyM.D.   On: 05/06/2015 14:28    Assessment & Plan:   There are no diagnoses linked to this encounter. I am having Ms. Mittleman maintain her Loratadine, Calcium Carbonate (CALCIUM 600 PO), vitamin C, MULTIPLE VITAMIN PO, OCUVITE PRESERVISION, acetaminophen, Clocortolone Pivalate, pramoxine-hydrocortisone, clopidogrel, atorvastatin, amLODipine, allopurinol, enalapril, hydrochlorothiazide, and ciprofloxacin.  No orders of the defined types were placed in this encounter.    Follow-up: No Follow-up on file.  AWalker Kehr MD

## 2016-02-18 NOTE — Assessment & Plan Note (Signed)
Norvasc, Enalapril, HCTZ 

## 2016-02-18 NOTE — Assessment & Plan Note (Signed)
Labs

## 2016-02-18 NOTE — Progress Notes (Signed)
Pre visit review using our clinic review tool, if applicable. No additional management support is needed unless otherwise documented below in the visit note. 

## 2016-03-16 ENCOUNTER — Telehealth: Payer: Medicare Other | Admitting: Family

## 2016-03-16 DIAGNOSIS — J209 Acute bronchitis, unspecified: Secondary | ICD-10-CM

## 2016-03-16 MED ORDER — BENZONATATE 100 MG PO CAPS: 100.0000 mg | ORAL_CAPSULE | Freq: Three times a day (TID) | ORAL | 0 refills | Status: DC | PRN

## 2016-03-16 MED ORDER — DOXYCYCLINE HYCLATE 100 MG PO TABS: 100.0000 mg | ORAL_TABLET | Freq: Two times a day (BID) | ORAL | 0 refills | Status: DC

## 2016-03-16 NOTE — Progress Notes (Signed)
We are sorry that you are not feeling well.  Here is how we plan to help!  Based on what you have shared with me it looks like you have upper respiratory tract inflammation that has resulted in a significant cough.  Inflammation and infection in the upper respiratory tract is commonly called bronchitis and has four common causes:  Allergies, Viral Infections, Acid Reflux and Bacterial Infections.  Allergies, viruses and acid reflux are treated by controlling symptoms or eliminating the cause. An example might be a cough caused by taking certain blood pressure medications. You stop the cough by changing the medication. Another example might be a cough caused by acid reflux. Controlling the reflux helps control the cough.  Based on your presentation I believe you most likely have A cough due to bacteria.  When patients have a fever and a productive cough with a change in color or increased sputum production, we are concerned about bacterial bronchitis.  If left untreated it can progress to pneumonia.  If your symptoms do not improve with your treatment plan it is important that you contact your provider.   I hve prescribed Doxycycline 100 mg twice a day for 7 days     In addition you may use A non-prescription cough medication called Robitussin DAC. Take 2 teaspoons every 8 hours or Delsym: take 2 teaspoons every 12 hours., A non-prescription cough medication called Mucinex DM: take 2 tablets every 12 hours. and A prescription cough medication called Tessalon Perles '100mg'$ . You may take 1-2 capsules every 8 hours as needed for your cough.    USE OF BRONCHODILATOR ("RESCUE") INHALERS: There is a risk from using your bronchodilator too frequently.  The risk is that over-reliance on a medication which only relaxes the muscles surrounding the breathing tubes can reduce the effectiveness of medications prescribed to reduce swelling and congestion of the tubes themselves.  Although you feel brief relief from the  bronchodilator inhaler, your asthma may actually be worsening with the tubes becoming more swollen and filled with mucus.  This can delay other crucial treatments, such as oral steroid medications. If you need to use a bronchodilator inhaler daily, several times per day, you should discuss this with your provider.  There are probably better treatments that could be used to keep your asthma under control.     HOME CARE . Only take medications as instructed by your medical team. . Complete the entire course of an antibiotic. . Drink plenty of fluids and get plenty of rest. . Avoid close contacts especially the very young and the elderly . Cover your mouth if you cough or cough into your sleeve. . Always remember to wash your hands . A steam or ultrasonic humidifier can help congestion.   GET HELP RIGHT AWAY IF: . You develop worsening fever. . You become short of breath . You cough up blood. . Your symptoms persist after you have completed your treatment plan MAKE SURE YOU   Understand these instructions.  Will watch your condition.  Will get help right away if you are not doing well or get worse.  Your e-visit answers were reviewed by a board certified advanced clinical practitioner to complete your personal care plan.  Depending on the condition, your plan could have included both over the counter or prescription medications. If there is a problem please reply  once you have received a response from your provider. Your safety is important to [place].  If you have drug allergies check your prescription  carefully.    You can use MyChart to ask questions about today's visit, request a non-urgent call back, or ask for a work or school excuse for 24 hours related to this e-Visit. If it has been greater than 24 hours you will need to follow up with your provider, or enter a new e-Visit to address those concerns. You will get an e-mail in the next two days asking about your experience.  I hope that  your e-visit has been valuable and will speed your recovery. Thank you for using e-visits.

## 2016-03-22 DIAGNOSIS — M48062 Spinal stenosis, lumbar region with neurogenic claudication: Secondary | ICD-10-CM | POA: Diagnosis not present

## 2016-03-22 DIAGNOSIS — I10 Essential (primary) hypertension: Secondary | ICD-10-CM | POA: Diagnosis not present

## 2016-03-22 DIAGNOSIS — M47816 Spondylosis without myelopathy or radiculopathy, lumbar region: Secondary | ICD-10-CM | POA: Diagnosis not present

## 2016-03-22 DIAGNOSIS — M4316 Spondylolisthesis, lumbar region: Secondary | ICD-10-CM | POA: Diagnosis not present

## 2016-03-22 DIAGNOSIS — M5136 Other intervertebral disc degeneration, lumbar region: Secondary | ICD-10-CM | POA: Diagnosis not present

## 2016-03-22 DIAGNOSIS — Z6833 Body mass index (BMI) 33.0-33.9, adult: Secondary | ICD-10-CM | POA: Diagnosis not present

## 2016-03-22 DIAGNOSIS — M5416 Radiculopathy, lumbar region: Secondary | ICD-10-CM | POA: Diagnosis not present

## 2016-04-07 DIAGNOSIS — M5116 Intervertebral disc disorders with radiculopathy, lumbar region: Secondary | ICD-10-CM | POA: Diagnosis not present

## 2016-04-07 DIAGNOSIS — M4726 Other spondylosis with radiculopathy, lumbar region: Secondary | ICD-10-CM | POA: Diagnosis not present

## 2016-04-07 DIAGNOSIS — M48061 Spinal stenosis, lumbar region without neurogenic claudication: Secondary | ICD-10-CM | POA: Diagnosis not present

## 2016-04-13 DIAGNOSIS — T300 Burn of unspecified body region, unspecified degree: Secondary | ICD-10-CM | POA: Diagnosis not present

## 2016-04-29 DIAGNOSIS — T300 Burn of unspecified body region, unspecified degree: Secondary | ICD-10-CM | POA: Diagnosis not present

## 2016-05-02 DIAGNOSIS — H35363 Drusen (degenerative) of macula, bilateral: Secondary | ICD-10-CM | POA: Diagnosis not present

## 2016-05-02 DIAGNOSIS — H43813 Vitreous degeneration, bilateral: Secondary | ICD-10-CM | POA: Diagnosis not present

## 2016-05-02 DIAGNOSIS — H353134 Nonexudative age-related macular degeneration, bilateral, advanced atrophic with subfoveal involvement: Secondary | ICD-10-CM | POA: Diagnosis not present

## 2016-05-06 ENCOUNTER — Ambulatory Visit (HOSPITAL_BASED_OUTPATIENT_CLINIC_OR_DEPARTMENT_OTHER): Payer: Medicare Other | Admitting: Oncology

## 2016-05-06 ENCOUNTER — Other Ambulatory Visit (HOSPITAL_BASED_OUTPATIENT_CLINIC_OR_DEPARTMENT_OTHER): Payer: Medicare Other

## 2016-05-06 VITALS — BP 153/73 | HR 82 | Temp 98.2°F | Resp 18 | Wt 185.1 lb

## 2016-05-06 DIAGNOSIS — C182 Malignant neoplasm of ascending colon: Secondary | ICD-10-CM

## 2016-05-06 DIAGNOSIS — Z85038 Personal history of other malignant neoplasm of large intestine: Secondary | ICD-10-CM | POA: Diagnosis not present

## 2016-05-06 LAB — CBC WITH DIFFERENTIAL/PLATELET
BASO%: 0.2 % (ref 0.0–2.0)
Basophils Absolute: 0 10*3/uL (ref 0.0–0.1)
EOS%: 3 % (ref 0.0–7.0)
Eosinophils Absolute: 0.1 10*3/uL (ref 0.0–0.5)
HCT: 39.2 % (ref 34.8–46.6)
HGB: 12.8 g/dL (ref 11.6–15.9)
LYMPH%: 25.2 % (ref 14.0–49.7)
MCH: 33.1 pg (ref 25.1–34.0)
MCHC: 32.7 g/dL (ref 31.5–36.0)
MCV: 101.3 fL — ABNORMAL HIGH (ref 79.5–101.0)
MONO#: 0.5 10*3/uL (ref 0.1–0.9)
MONO%: 10.9 % (ref 0.0–14.0)
NEUT#: 2.6 10*3/uL (ref 1.5–6.5)
NEUT%: 60.7 % (ref 38.4–76.8)
Platelets: 225 10*3/uL (ref 145–400)
RBC: 3.87 10*6/uL (ref 3.70–5.45)
RDW: 14.7 % — ABNORMAL HIGH (ref 11.2–14.5)
WBC: 4.3 10*3/uL (ref 3.9–10.3)
lymph#: 1.1 10*3/uL (ref 0.9–3.3)

## 2016-05-06 LAB — COMPREHENSIVE METABOLIC PANEL
ALT: 22 U/L (ref 0–55)
AST: 22 U/L (ref 5–34)
Albumin: 3.5 g/dL (ref 3.5–5.0)
Alkaline Phosphatase: 79 U/L (ref 40–150)
Anion Gap: 10 mEq/L (ref 3–11)
BUN: 17.8 mg/dL (ref 7.0–26.0)
CO2: 31 mEq/L — ABNORMAL HIGH (ref 22–29)
Calcium: 9.8 mg/dL (ref 8.4–10.4)
Chloride: 100 mEq/L (ref 98–109)
Creatinine: 0.7 mg/dL (ref 0.6–1.1)
EGFR: 76 mL/min/{1.73_m2} — ABNORMAL LOW (ref >90)
Glucose: 98 mg/dl (ref 70–140)
Potassium: 3.5 mEq/L (ref 3.5–5.1)
Sodium: 141 mEq/L (ref 136–145)
Total Bilirubin: 0.65 mg/dL (ref 0.20–1.20)
Total Protein: 6.8 g/dL (ref 6.4–8.3)

## 2016-05-06 LAB — CEA (IN HOUSE-CHCC): CEA (CHCC-In House): 2.62 ng/mL (ref 0.00–5.00)

## 2016-05-06 NOTE — Progress Notes (Signed)
Hematology and Oncology Follow Up Visit  Gina [name] [id number] 01/19/1932 83 y.o. 05/06/2016 10:19 AM    Principle Diagnosis: 83 year old woman T3 N0 (0 of 19 lymph nodes) poorly-differentiated adenocarcinoma of the ascending colon. Diagnosed in 01/2011.   Prior Therapy: Status post laparoscopic-assisted right hemicolectomy on 01/21/2011.  Current therapy: Observation and follow up.   Interim History:  Gina [name] presents today for a follow up visit by her self. Since the last visit, she was diagnosed with bronchitis which has resolved at this time. She developed symptoms of cough and fever and required antibiotics. She did not require any hospitalizations. She has resumed most activities of daily living at this time. She is able to ambulate and remains active. She does not drive related to macular degeneration.  She denied any GI complaints including abdominal pain, as well as satiety or hematochezia. Her bowels move reasonably regular which have not changed dramatically.  She has not reported any headaches or blurry vision or double vision. She does not report any fevers, chills or sweats. Her appetite is excellent and have gained weight. She has not reported any chest pain or shortness of breath. Has not reported any frequency urgency or hesitancy. Has not reported any musca skeletal complaints. Rest of the review of system is unremarkable.  Medications: I have reviewed the patient's current medications. Current Outpatient Prescriptions  Medication Sig Dispense Refill  . acetaminophen (TYLENOL) 500 MG tablet Take 500 mg by mouth every 6 (six) hours as needed. For pain    . allopurinol (ZYLOPRIM) 100 MG tablet Take 1 tablet (100 mg total) by mouth daily. 90 tablet 3  . amLODipine (NORVASC) 5 MG tablet Take 1 tablet (5 mg total) by mouth daily. 90 tablet 3  . atorvastatin (LIPITOR) 40 MG tablet Take 1 tablet (40 mg total) by mouth daily. 90 tablet 3  . Calcium Carbonate (CALCIUM  600 PO) Take 600 mg by mouth 2 (two) times daily.     . clopidogrel (PLAVIX) 75 MG tablet Take 1 tablet (75 mg total) by mouth daily. 90 tablet 3  . enalapril (VASOTEC) 10 MG tablet Take 1 tablet (10 mg total) by mouth daily. 90 tablet 3  . hydrochlorothiazide (MICROZIDE) 12.5 MG capsule Take 1 capsule (12.5 mg total) by mouth daily. 90 capsule 3  . Loratadine 10 MG CAPS Take 10 mg by mouth daily.     . MULTIPLE VITAMIN PO Take 1 tablet by mouth daily.     . Multiple Vitamins-Minerals (OCUVITE PRESERVISION) TABS Take 1 tablet by mouth daily.     . pramoxine-hydrocortisone (PROCTOCREAM-HC) 1-1 % rectal cream Place 1 application rectally 2 (two) times daily. 30 g 1  . Ascorbic Acid (VITAMIN C) 100 MG tablet Take 500 mg by mouth daily.      No current facility-administered medications for this visit.     Allergies:  Allergies  Allergen Reactions  . Aspirin Hives  . Penicillins Hives    Past Medical History, Surgical history, Social history, and Family History were reviewed and updated.   Physical Exam: Blood pressure (!) 153/73, pulse 82, temperature 98.2 F (36.8 C), temperature source Oral, resp. rate 18, weight 185 lb 1.6 oz (84 kg), SpO2 100 %. ECOG: 1 General appearance: Well-appearing woman without distress. Head: Normocephalic, without obvious abnormality no oral thrush or ulcers. Neck: no adenopathy Lymph nodes: Cervical, supraclavicular, and axillary nodes normal. Heart:regular rate and rhythm, S1, S2 normal, no murmur, click, rub or gallop Lung:chest clear, no wheezing, rales, normal  symmetric air entry Abdomin: soft, non-tender, without masses or organomegaly no shifting dullness or ascites. EXT:no erythema, induration, or nodules   Lab Results: Lab Results  Component Value Date   WBC 4.3 05/06/2016   HGB 12.8 05/06/2016   HCT 39.2 05/06/2016   MCV 101.3 (H) 05/06/2016   PLT 225 05/06/2016     Chemistry      Component Value Date/Time   NA 136 02/17/2016 0950    NA 140 05/06/2015 1049   K 3.9 02/17/2016 0950   K 4.2 05/06/2015 1049   CL 96 02/17/2016 0950   CL 101 06/20/2012 0922   CO2 30 02/17/2016 0950   CO2 32 (H) 05/06/2015 1049   BUN 16 02/17/2016 0950   BUN 19.8 05/06/2015 1049   CREATININE 0.70 02/17/2016 0950   CREATININE 0.9 05/06/2015 1049      Component Value Date/Time   CALCIUM 9.3 02/17/2016 0950   CALCIUM 9.6 05/06/2015 1049   ALKPHOS 63 02/17/2016 0950   ALKPHOS 73 05/06/2015 1049   AST 22 02/17/2016 0950   AST 26 05/06/2015 1049   ALT 18 02/17/2016 0950   ALT 19 05/06/2015 1049   BILITOT 0.9 02/17/2016 0950   BILITOT 0.82 05/06/2015 1049      Impression and Plan:   83 year old woman with:  1. T3 N0 poorly-differentiated adenocarcinoma of the ascending colon. She is status post laparoscopic-assisted right hemicolectomy on 01/21/2011. She is currently on active surveillance without any evidence of relapse.  Her CT scan and labs from  03/22/2017showed no evidence to suggest cancer relapse.  She is doing well without any clinical signs or symptoms of relapse.   She is over 5 years out from her primary surgical therapy without any evidence of recurrent disease. I recommended no routine oncology follow-up at this time but I'm happy to see her in the future as needed.  2. Colonoscopy screen: She is up to date with her last one on December 2015. This will be repeated in 2020.  3. Loose bowel movements: Manageable at this time and back to baseline for her.  4. Follow-up: As needed.     [facility], MD 3/23/201810:19 AM

## 2016-05-07 LAB — CEA: CEA: 2.7 ng/mL (ref 0.0–4.7)

## 2016-05-25 DIAGNOSIS — L8990 Pressure ulcer of unspecified site, unspecified stage: Secondary | ICD-10-CM | POA: Diagnosis not present

## 2016-06-09 DIAGNOSIS — Z1231 Encounter for screening mammogram for malignant neoplasm of breast: Secondary | ICD-10-CM | POA: Diagnosis not present

## 2016-06-09 DIAGNOSIS — M8589 Other specified disorders of bone density and structure, multiple sites: Secondary | ICD-10-CM | POA: Diagnosis not present

## 2016-06-09 LAB — HM DEXA SCAN: HM Dexa Scan: -1.9

## 2016-06-09 LAB — HM MAMMOGRAPHY

## 2016-06-17 ENCOUNTER — Encounter: Payer: Self-pay | Admitting: Internal Medicine

## 2016-06-21 DIAGNOSIS — M4316 Spondylolisthesis, lumbar region: Secondary | ICD-10-CM | POA: Diagnosis not present

## 2016-06-21 DIAGNOSIS — M5136 Other intervertebral disc degeneration, lumbar region: Secondary | ICD-10-CM | POA: Diagnosis not present

## 2016-06-21 DIAGNOSIS — M5416 Radiculopathy, lumbar region: Secondary | ICD-10-CM | POA: Diagnosis not present

## 2016-06-21 DIAGNOSIS — M47816 Spondylosis without myelopathy or radiculopathy, lumbar region: Secondary | ICD-10-CM | POA: Diagnosis not present

## 2016-06-21 DIAGNOSIS — M48062 Spinal stenosis, lumbar region with neurogenic claudication: Secondary | ICD-10-CM | POA: Diagnosis not present

## 2016-06-30 ENCOUNTER — Encounter: Payer: Self-pay | Admitting: Internal Medicine

## 2016-07-04 ENCOUNTER — Telehealth: Payer: Self-pay | Admitting: Internal Medicine

## 2016-07-04 DIAGNOSIS — E785 Hyperlipidemia, unspecified: Secondary | ICD-10-CM

## 2016-07-04 DIAGNOSIS — I10 Essential (primary) hypertension: Secondary | ICD-10-CM

## 2016-07-04 DIAGNOSIS — Z0000 Encounter for general adult medical examination without abnormal findings: Secondary | ICD-10-CM

## 2016-07-04 NOTE — Telephone Encounter (Signed)
OK. Thx

## 2016-07-04 NOTE — Telephone Encounter (Signed)
Please advise about what labs to enter

## 2016-07-04 NOTE — Telephone Encounter (Signed)
Pt had a physical appointment on 08/15/2016. She would like to have labs done before her appointment.

## 2016-07-05 NOTE — Telephone Encounter (Signed)
LM notifying pt

## 2016-08-04 DIAGNOSIS — M4726 Other spondylosis with radiculopathy, lumbar region: Secondary | ICD-10-CM | POA: Diagnosis not present

## 2016-08-04 DIAGNOSIS — M48061 Spinal stenosis, lumbar region without neurogenic claudication: Secondary | ICD-10-CM | POA: Diagnosis not present

## 2016-08-04 DIAGNOSIS — M5116 Intervertebral disc disorders with radiculopathy, lumbar region: Secondary | ICD-10-CM | POA: Diagnosis not present

## 2016-08-04 DIAGNOSIS — M5136 Other intervertebral disc degeneration, lumbar region: Secondary | ICD-10-CM | POA: Diagnosis not present

## 2016-08-09 ENCOUNTER — Other Ambulatory Visit (INDEPENDENT_AMBULATORY_CARE_PROVIDER_SITE_OTHER): Payer: Medicare Other

## 2016-08-09 DIAGNOSIS — Z0000 Encounter for general adult medical examination without abnormal findings: Secondary | ICD-10-CM | POA: Diagnosis not present

## 2016-08-09 DIAGNOSIS — E785 Hyperlipidemia, unspecified: Secondary | ICD-10-CM | POA: Diagnosis not present

## 2016-08-09 DIAGNOSIS — I10 Essential (primary) hypertension: Secondary | ICD-10-CM

## 2016-08-09 LAB — HEPATIC FUNCTION PANEL
ALT: 21 U/L (ref 0–35)
AST: 18 U/L (ref 0–37)
Albumin: 4.5 g/dL (ref 3.5–5.2)
Alkaline Phosphatase: 68 U/L (ref 39–117)
Bilirubin, Direct: 0.2 mg/dL (ref 0.0–0.3)
Total Bilirubin: 0.9 mg/dL (ref 0.2–1.2)
Total Protein: 7.3 g/dL (ref 6.0–8.3)

## 2016-08-09 LAB — LIPID PANEL
Cholesterol: 176 mg/dL (ref 0–200)
HDL: 78.9 mg/dL (ref >39.00)
LDL Cholesterol: 74 mg/dL (ref 0–99)
NonHDL: 96.87
Total CHOL/HDL Ratio: 2
Triglycerides: 115 mg/dL (ref 0.0–149.0)
VLDL: 23 mg/dL (ref 0.0–40.0)

## 2016-08-09 LAB — BASIC METABOLIC PANEL
BUN: 24 mg/dL — ABNORMAL HIGH (ref 6–23)
CO2: 30 mEq/L (ref 19–32)
Calcium: 9.9 mg/dL (ref 8.4–10.5)
Chloride: 97 mEq/L (ref 96–112)
Creatinine, Ser: 0.77 mg/dL (ref 0.40–1.20)
GFR: 76.07 mL/min (ref >60.00)
Glucose, Bld: 109 mg/dL — ABNORMAL HIGH (ref 70–99)
Potassium: 4.2 mEq/L (ref 3.5–5.1)
Sodium: 134 mEq/L — ABNORMAL LOW (ref 135–145)

## 2016-08-09 LAB — CBC WITH DIFFERENTIAL/PLATELET
Basophils Absolute: 0 10*3/uL (ref 0.0–0.1)
Basophils Relative: 0.1 % (ref 0.0–3.0)
Eosinophils Absolute: 0 10*3/uL (ref 0.0–0.7)
Eosinophils Relative: 0.1 % (ref 0.0–5.0)
HCT: 43.1 % (ref 36.0–46.0)
Hemoglobin: 14.5 g/dL (ref 12.0–15.0)
Lymphocytes Relative: 18.9 % (ref 12.0–46.0)
Lymphs Abs: 1.3 10*3/uL (ref 0.7–4.0)
MCHC: 33.7 g/dL (ref 30.0–36.0)
MCV: 97.7 fl (ref 78.0–100.0)
Monocytes Absolute: 0.8 10*3/uL (ref 0.1–1.0)
Monocytes Relative: 11.3 % (ref 3.0–12.0)
Neutro Abs: 4.7 10*3/uL (ref 1.4–7.7)
Neutrophils Relative %: 69.6 % (ref 43.0–77.0)
Platelets: 305 10*3/uL (ref 150.0–400.0)
RBC: 4.41 Mil/uL (ref 3.87–5.11)
RDW: 14.7 % (ref 11.5–15.5)
WBC: 6.8 10*3/uL (ref 4.0–10.5)

## 2016-08-09 LAB — URINALYSIS
Bilirubin Urine: NEGATIVE
Hgb urine dipstick: NEGATIVE
Ketones, ur: NEGATIVE
Leukocytes, UA: NEGATIVE
Nitrite: NEGATIVE
Specific Gravity, Urine: <=1.005 — AB (ref 1.000–1.030)
Total Protein, Urine: NEGATIVE
Urine Glucose: NEGATIVE
Urobilinogen, UA: 0.2 (ref 0.0–1.0)
pH: 6.5 (ref 5.0–8.0)

## 2016-08-09 LAB — TSH: TSH: 1.44 u[IU]/mL (ref 0.35–4.50)

## 2016-08-15 ENCOUNTER — Ambulatory Visit (INDEPENDENT_AMBULATORY_CARE_PROVIDER_SITE_OTHER): Payer: Medicare Other | Admitting: Internal Medicine

## 2016-08-15 ENCOUNTER — Encounter: Payer: Self-pay | Admitting: Internal Medicine

## 2016-08-15 VITALS — BP 156/78 | HR 78 | Temp 97.7°F | Ht 62.0 in | Wt 183.0 lb

## 2016-08-15 DIAGNOSIS — I10 Essential (primary) hypertension: Secondary | ICD-10-CM

## 2016-08-15 DIAGNOSIS — Z0000 Encounter for general adult medical examination without abnormal findings: Secondary | ICD-10-CM | POA: Diagnosis not present

## 2016-08-15 DIAGNOSIS — Z23 Encounter for immunization: Secondary | ICD-10-CM

## 2016-08-15 DIAGNOSIS — C189 Malignant neoplasm of colon, unspecified: Secondary | ICD-10-CM

## 2016-08-15 MED ORDER — ATORVASTATIN CALCIUM 40 MG PO TABS: 40.0000 mg | ORAL_TABLET | Freq: Every day | ORAL | 3 refills | Status: DC

## 2016-08-15 MED ORDER — CLOPIDOGREL BISULFATE 75 MG PO TABS: 75.0000 mg | ORAL_TABLET | Freq: Every day | ORAL | 3 refills | Status: DC

## 2016-08-15 MED ORDER — ENALAPRIL MALEATE 10 MG PO TABS: 10.0000 mg | ORAL_TABLET | Freq: Every day | ORAL | 3 refills | Status: DC

## 2016-08-15 MED ORDER — AMLODIPINE BESYLATE 5 MG PO TABS: 5.0000 mg | ORAL_TABLET | Freq: Every day | ORAL | 3 refills | Status: DC

## 2016-08-15 MED ORDER — ALLOPURINOL 100 MG PO TABS: 100.0000 mg | ORAL_TABLET | Freq: Every day | ORAL | 3 refills | Status: DC

## 2016-08-15 MED ORDER — HYDROCHLOROTHIAZIDE 12.5 MG PO CAPS: 12.5000 mg | ORAL_CAPSULE | Freq: Every day | ORAL | 3 refills | Status: DC

## 2016-08-15 NOTE — Assessment & Plan Note (Signed)
Here for medicare wellness/physical  Diet: heart healthy  Physical activity: sedentary  Depression/mood screen: negative  Hearing: intact to whispered voice  Visual acuity: decreased, performs annual eye exam  ADLs: capable  Fall risk: moderate due to poor vision Home safety: good  Cognitive evaluation: intact to orientation, naming, recall and repetition  EOL planning: adv directives, full code/ I agree  I have personally reviewed and have noted  1. The patient's medical, surgical and social history  2. Their use of alcohol, tobacco or illicit drugs  3. Their current medications and supplements  4. The patient's functional ability including ADL's, fall risks, home safety risks and hearing or visual impairment.  5. Diet and physical activities  6. Evidence for depression or mood disorders 7. The roster of all physicians providing medical care to patient - is listed in the Snapshot section of the chart and reviewed today.    Today patient counseled on age appropriate routine health concerns for screening and prevention, each reviewed and up to date or declined. Immunizations reviewed and up to date or declined. Labs ordered and reviewed. Risk factors for depression reviewed and negative. Hearing function and visual acuity are intact. ADLs screened and addressed as needed. Functional ability and level of safety reviewed and appropriate. Education, counseling and referrals performed based on assessed risks today. Patient provided with a copy of personalized plan for preventive services.  Not driving

## 2016-08-15 NOTE — Assessment & Plan Note (Signed)
Norvasc, Enalapril, HCTZ

## 2016-08-15 NOTE — Progress Notes (Signed)
Subjective:  Patient ID: Gina [name], female    DOB: April 19, 1931  Age: 83 y.o. MRN: [id number]  CC: No chief complaint on file.   HPI Gina [name] presents for a well exam. C/o LBP: injections; sleeping in a recliner C/o ?claudication  Outpatient Medications Prior to Visit  Medication Sig Dispense Refill  . acetaminophen (TYLENOL) 500 MG tablet Take 500 mg by mouth every 6 (six) hours as needed. For pain    . allopurinol (ZYLOPRIM) 100 MG tablet Take 1 tablet (100 mg total) by mouth daily. 90 tablet 3  . amLODipine (NORVASC) 5 MG tablet Take 1 tablet (5 mg total) by mouth daily. 90 tablet 3  . Ascorbic Acid (VITAMIN C) 100 MG tablet Take 500 mg by mouth daily.     [name] atorvastatin (LIPITOR) 40 MG tablet Take 1 tablet (40 mg total) by mouth daily. 90 tablet 3  . Calcium Carbonate (CALCIUM 600 PO) Take 600 mg by mouth 2 (two) times daily.     . clopidogrel (PLAVIX) 75 MG tablet Take 1 tablet (75 mg total) by mouth daily. 90 tablet 3  . enalapril (VASOTEC) 10 MG tablet Take 1 tablet (10 mg total) by mouth daily. 90 tablet 3  . hydrochlorothiazide (MICROZIDE) 12.5 MG capsule Take 1 capsule (12.5 mg total) by mouth daily. 90 capsule 3  . Loratadine 10 MG CAPS Take 10 mg by mouth daily.     . MULTIPLE VITAMIN PO Take 1 tablet by mouth daily.     . Multiple Vitamins-Minerals (OCUVITE PRESERVISION) TABS Take 1 tablet by mouth daily.     . pramoxine-hydrocortisone (PROCTOCREAM-HC) 1-1 % rectal cream Place 1 application rectally 2 (two) times daily. 30 g 1   No facility-administered medications prior to visit.     ROS Review of Systems  Constitutional: Negative for activity change, appetite change, chills, fatigue and unexpected weight change.  HENT: Negative for congestion, mouth sores and sinus pressure.   Eyes: Positive for visual disturbance.  Respiratory: Negative for cough and chest tightness.   Gastrointestinal: Negative for abdominal pain and nausea.  Genitourinary:  Negative for difficulty urinating, frequency and vaginal pain.  Musculoskeletal: Positive for back pain. Negative for gait problem.  Skin: Negative for pallor and rash.  Neurological: Negative for dizziness, tremors, weakness, numbness and headaches.  Psychiatric/Behavioral: Negative for confusion and sleep disturbance.    Objective:  BP (!) 156/78 (BP Location: Left Arm, Patient Position: Sitting, Cuff Size: Normal)   Pulse 78   Temp 97.7 F (36.5 C) (Oral)   Ht 5\' 2"  (1.575 m)   Wt 183 lb (83 kg)   SpO2 97%   BMI 33.47 kg/m   BP Readings from Last 3 Encounters:  08/15/16 (!) 156/78  05/06/16 (!) 153/73  02/18/16 138/70    Wt Readings from Last 3 Encounters:  08/15/16 183 lb (83 kg)  05/06/16 185 lb 1.6 oz (84 kg)  02/18/16 184 lb 6.4 oz (83.6 kg)    Physical Exam  Constitutional: She appears well-developed. No distress.  HENT:  Head: Normocephalic.  Right Ear: External ear normal.  Left Ear: External ear normal.  Nose: Nose normal.  Mouth/Throat: Oropharynx is clear and moist.  Eyes: Conjunctivae are normal. Pupils are equal, round, and reactive to light. Right eye exhibits no discharge. Left eye exhibits no discharge.  Neck: Normal range of motion. Neck supple. No JVD present. No tracheal deviation present. No thyromegaly present.  Cardiovascular: Normal rate, regular rhythm and normal heart sounds.  Pulmonary/Chest: No stridor. No respiratory distress. She has no wheezes.  Abdominal: Soft. Bowel sounds are normal. She exhibits no distension and no mass. There is no tenderness. There is no rebound and no guarding.  Musculoskeletal: She exhibits tenderness. She exhibits no edema.  Lymphadenopathy:    She has no cervical adenopathy.  Neurological: She displays normal reflexes. No cranial nerve deficit. She exhibits normal muscle tone. Coordination normal.  Skin: No rash noted. No erythema.  Psychiatric: She has a normal mood and affect. Her behavior is normal.  Judgment and thought content normal.  LS tender  Lab Results  Component Value Date   WBC 6.8 08/09/2016   HGB 14.5 08/09/2016   HCT 43.1 08/09/2016   PLT 305.0 08/09/2016   GLUCOSE 109 (H) 08/09/2016   CHOL 176 08/09/2016   TRIG 115.0 08/09/2016   HDL 78.90 08/09/2016   LDLDIRECT 69.0 09/09/2014   LDLCALC 74 08/09/2016   ALT 21 08/09/2016   AST 18 08/09/2016   NA 134 (L) 08/09/2016   K 4.2 08/09/2016   CL 97 08/09/2016   CREATININE 0.77 08/09/2016   BUN 24 (H) 08/09/2016   CO2 30 08/09/2016   TSH 1.44 08/09/2016   INR 0.94 06/07/2010   HGBA1C 5.8 02/18/2016    Ct Chest W Contrast  Result Date: 05/06/2015 CLINICAL DATA:  Colon cancer diagnosed 01/2011, status post partial colectomy EXAM: CT CHEST, ABDOMEN, AND PELVIS WITH CONTRAST TECHNIQUE: Multidetector CT imaging of the chest, abdomen and pelvis was performed following the standard protocol during bolus administration of intravenous contrast. CONTRAST:  179mL ISOVUE-300 IOPAMIDOL (ISOVUE-300) INJECTION 61% COMPARISON:  CT abdomen pelvis dated 05/07/2014. CT chest abdomen pelvis dated 06/11/2013. FINDINGS: CT CHEST FINDINGS Mediastinum/Nodes: Heart is normal in size. No pericardial effusion. Three vessel coronary atherosclerosis. Atherosclerotic calcifications of the aortic arch. No suspicious mediastinal lymphadenopathy. Visualized thyroid is unremarkable. Lungs/Pleura: Lungs are essentially clear. No suspicious pulmonary nodules. Stable subpleural nodular scarring in the left lower lobe (series 4/image 36). Stable 3 mm subpleural nodule in the right lower lobe (series 4/image 25). Mild lingular scarring.  No focal consolidation. No pleural effusion or pneumothorax. Musculoskeletal: Degenerative changes of the thoracic spine. CT ABDOMEN PELVIS FINDINGS Hepatobiliary: The liver is within normal limits. No suspicious/enhancing hepatic lesions. Gallbladder is unremarkable. No intrahepatic or extrahepatic ductal dilatation. Pancreas:  Within normal limits. Spleen:  Within normal limits. Adrenals/Urinary Tract: Adrenal glands are within normal limits. Small bilateral renal cysts, measuring up to 1.6 cm along the right upper pole (series 2/ image 58). No hydronephrosis. Bladder is within normal limits. Stomach/Bowel: Stomach is within normal limits. Status post right hemicolectomy. Extensive sigmoid diverticulosis, without evidence of diverticulitis. No evidence of bowel obstruction. Vascular/Lymphatic: Atherosclerotic calcifications of the abdominal aorta and branch vessels. No evidence of abdominal aortic aneurysm. No suspicious abdominopelvic lymphadenopathy. Reproductive: Uterus is unremarkable. Bilateral ovaries are within normal limits. Other: No abdominopelvic ascites. Musculoskeletal: Degenerative changes of the lumbar spine. IMPRESSION: Status post right hemicolectomy. No evidence of recurrent or metastatic disease. Electronically Signed   By: Julian Hy M.D.   On: 05/06/2015 14:28   Ct Abdomen Pelvis W Contrast  Result Date: 05/06/2015 CLINICAL DATA:  Colon cancer diagnosed 01/2011, status post partial colectomy EXAM: CT CHEST, ABDOMEN, AND PELVIS WITH CONTRAST TECHNIQUE: Multidetector CT imaging of the chest, abdomen and pelvis was performed following the standard protocol during bolus administration of intravenous contrast. CONTRAST:  167mL ISOVUE-300 IOPAMIDOL (ISOVUE-300) INJECTION 61% COMPARISON:  CT abdomen pelvis dated 05/07/2014. CT chest abdomen pelvis dated  06/11/2013. FINDINGS: CT CHEST FINDINGS Mediastinum/Nodes: Heart is normal in size. No pericardial effusion. Three vessel coronary atherosclerosis. Atherosclerotic calcifications of the aortic arch. No suspicious mediastinal lymphadenopathy. Visualized thyroid is unremarkable. Lungs/Pleura: Lungs are essentially clear. No suspicious pulmonary nodules. Stable subpleural nodular scarring in the left lower lobe (series 4/image 36). Stable 3 mm subpleural nodule in the  right lower lobe (series 4/image 25). Mild lingular scarring.  No focal consolidation. No pleural effusion or pneumothorax. Musculoskeletal: Degenerative changes of the thoracic spine. CT ABDOMEN PELVIS FINDINGS Hepatobiliary: The liver is within normal limits. No suspicious/enhancing hepatic lesions. Gallbladder is unremarkable. No intrahepatic or extrahepatic ductal dilatation. Pancreas: Within normal limits. Spleen:  Within normal limits. Adrenals/Urinary Tract: Adrenal glands are within normal limits. Small bilateral renal cysts, measuring up to 1.6 cm along the right upper pole (series 2/ image 58). No hydronephrosis. Bladder is within normal limits. Stomach/Bowel: Stomach is within normal limits. Status post right hemicolectomy. Extensive sigmoid diverticulosis, without evidence of diverticulitis. No evidence of bowel obstruction. Vascular/Lymphatic: Atherosclerotic calcifications of the abdominal aorta and branch vessels. No evidence of abdominal aortic aneurysm. No suspicious abdominopelvic lymphadenopathy. Reproductive: Uterus is unremarkable. Bilateral ovaries are within normal limits. Other: No abdominopelvic ascites. Musculoskeletal: Degenerative changes of the lumbar spine. IMPRESSION: Status post right hemicolectomy. No evidence of recurrent or metastatic disease. Electronically Signed   By: Julian Hy M.D.   On: 05/06/2015 14:28    Assessment & Plan:   There are no diagnoses linked to this encounter. I am having Gina [name] maintain her Loratadine, Calcium Carbonate (CALCIUM 600 PO), vitamin C, MULTIPLE VITAMIN PO, OCUVITE PRESERVISION, acetaminophen, pramoxine-hydrocortisone, clopidogrel, atorvastatin, amLODipine, allopurinol, enalapril, and hydrochlorothiazide.  No orders of the defined types were placed in this encounter.    Follow-up: No Follow-up on file.  Walker Kehr, MD

## 2016-08-15 NOTE — Patient Instructions (Signed)
Health Maintenance for Postmenopausal Women Menopause is a normal process in which your reproductive ability comes to an end. This process happens gradually over a span of months to years, usually between the ages of 22 and 9. Menopause is complete when you have missed 12 consecutive menstrual periods. It is important to talk with your health care provider about some of the most common conditions that affect postmenopausal women, such as heart disease, cancer, and bone loss (osteoporosis). Adopting a healthy lifestyle and getting preventive care can help to promote your health and wellness. Those actions can also lower your chances of developing some of these common conditions. What should I know about menopause? During menopause, you may experience a number of symptoms, such as:  Moderate-to-severe hot flashes.  Night sweats.  Decrease in sex drive.  Mood swings.  Headaches.  Tiredness.  Irritability.  Memory problems.  Insomnia.  Choosing to treat or not to treat menopausal changes is an individual decision that you make with your health care provider. What should I know about hormone replacement therapy and supplements? Hormone therapy products are effective for treating symptoms that are associated with menopause, such as hot flashes and night sweats. Hormone replacement carries certain risks, especially as you become older. If you are thinking about using estrogen or estrogen with progestin treatments, discuss the benefits and risks with your health care provider. What should I know about heart disease and stroke? Heart disease, heart attack, and stroke become more likely as you age. This may be due, in part, to the hormonal changes that your body experiences during menopause. These can affect how your body processes dietary fats, triglycerides, and cholesterol. Heart attack and stroke are both medical emergencies. There are many things that you can do to help prevent heart disease  and stroke:  Have your blood pressure checked at least every 1-2 years. High blood pressure causes heart disease and increases the risk of stroke.  If you are 53-22 years old, ask your health care provider if you should take aspirin to prevent a heart attack or a stroke.  Do not use any tobacco products, including cigarettes, chewing tobacco, or electronic cigarettes. If you need help quitting, ask your health care provider.  It is important to eat a healthy diet and maintain a healthy weight. ? Be sure to include plenty of vegetables, fruits, low-fat dairy products, and lean protein. ? Avoid eating foods that are high in solid fats, added sugars, or salt (sodium).  Get regular exercise. This is one of the most important things that you can do for your health. ? Try to exercise for at least 150 minutes each week. The type of exercise that you do should increase your heart rate and make you sweat. This is known as moderate-intensity exercise. ? Try to do strengthening exercises at least twice each week. Do these in addition to the moderate-intensity exercise.  Know your numbers.Ask your health care provider to check your cholesterol and your blood glucose. Continue to have your blood tested as directed by your health care provider.  What should I know about cancer screening? There are several types of cancer. Take the following steps to reduce your risk and to catch any cancer development as early as possible. Breast Cancer  Practice breast self-awareness. ? This means understanding how your breasts normally appear and feel. ? It also means doing regular breast self-exams. Let your health care provider know about any changes, no matter how small.  If you are 40  or older, have a clinician do a breast exam (clinical breast exam or CBE) every year. Depending on your age, family history, and medical history, it may be recommended that you also have a yearly breast X-ray (mammogram).  If you  have a family history of breast cancer, talk with your health care provider about genetic screening.  If you are at high risk for breast cancer, talk with your health care provider about having an MRI and a mammogram every year.  Breast cancer (BRCA) gene test is recommended for women who have family members with BRCA-related cancers. Results of the assessment will determine the need for genetic counseling and BRCA1 and for BRCA2 testing. BRCA-related cancers include these types: ? Breast. This occurs in males or females. ? Ovarian. ? Tubal. This may also be called fallopian tube cancer. ? Cancer of the abdominal or pelvic lining (peritoneal cancer). ? Prostate. ? Pancreatic.  Cervical, Uterine, and Ovarian Cancer Your health care provider may recommend that you be screened regularly for cancer of the pelvic organs. These include your ovaries, uterus, and vagina. This screening involves a pelvic exam, which includes checking for microscopic changes to the surface of your cervix (Pap test).  For women ages 21-65, health care providers may recommend a pelvic exam and a Pap test every three years. For women ages 79-65, they may recommend the Pap test and pelvic exam, combined with testing for human papilloma virus (HPV), every five years. Some types of HPV increase your risk of cervical cancer. Testing for HPV may also be done on women of any age who have unclear Pap test results.  Other health care providers may not recommend any screening for nonpregnant women who are considered low risk for pelvic cancer and have no symptoms. Ask your health care provider if a screening pelvic exam is right for you.  If you have had past treatment for cervical cancer or a condition that could lead to cancer, you need Pap tests and screening for cancer for at least 20 years after your treatment. If Pap tests have been discontinued for you, your risk factors (such as having a new sexual partner) need to be  reassessed to determine if you should start having screenings again. Some women have medical problems that increase the chance of getting cervical cancer. In these cases, your health care provider may recommend that you have screening and Pap tests more often.  If you have a family history of uterine cancer or ovarian cancer, talk with your health care provider about genetic screening.  If you have vaginal bleeding after reaching menopause, tell your health care provider.  There are currently no reliable tests available to screen for ovarian cancer.  Lung Cancer Lung cancer screening is recommended for adults 69-62 years old who are at high risk for lung cancer because of a history of smoking. A yearly low-dose CT scan of the lungs is recommended if you:  Currently smoke.  Have a history of at least 30 pack-years of smoking and you currently smoke or have quit within the past 15 years. A pack-year is smoking an average of one pack of cigarettes per day for one year.  Yearly screening should:  Continue until it has been 15 years since you quit.  Stop if you develop a health problem that would prevent you from having lung cancer treatment.  Colorectal Cancer  This type of cancer can be detected and can often be prevented.  Routine colorectal cancer screening usually begins at  age 42 and continues through age 45.  If you have risk factors for colon cancer, your health care provider may recommend that you be screened at an earlier age.  If you have a family history of colorectal cancer, talk with your health care provider about genetic screening.  Your health care provider may also recommend using home test kits to check for hidden blood in your stool.  A small camera at the end of a tube can be used to examine your colon directly (sigmoidoscopy or colonoscopy). This is done to check for the earliest forms of colorectal cancer.  Direct examination of the colon should be repeated every  5-10 years until age 71. However, if early forms of precancerous polyps or small growths are found or if you have a family history or genetic risk for colorectal cancer, you may need to be screened more often.  Skin Cancer  Check your skin from head to toe regularly.  Monitor any moles. Be sure to tell your health care provider: ? About any new moles or changes in moles, especially if there is a change in a mole's shape or color. ? If you have a mole that is larger than the size of a pencil eraser.  If any of your family members has a history of skin cancer, especially at a young age, talk with your health care provider about genetic screening.  Always use sunscreen. Apply sunscreen liberally and repeatedly throughout the day.  Whenever you are outside, protect yourself by wearing long sleeves, pants, a wide-brimmed hat, and sunglasses.  What should I know about osteoporosis? Osteoporosis is a condition in which bone destruction happens more quickly than new bone creation. After menopause, you may be at an increased risk for osteoporosis. To help prevent osteoporosis or the bone fractures that can happen because of osteoporosis, the following is recommended:  If you are 46-71 years old, get at least 1,000 mg of calcium and at least 600 mg of vitamin D per day.  If you are older than age 55 but younger than age 65, get at least 1,200 mg of calcium and at least 600 mg of vitamin D per day.  If you are older than age 54, get at least 1,200 mg of calcium and at least 800 mg of vitamin D per day.  Smoking and excessive alcohol intake increase the risk of osteoporosis. Eat foods that are rich in calcium and vitamin D, and do weight-bearing exercises several times each week as directed by your health care provider. What should I know about how menopause affects my mental health? Depression may occur at any age, but it is more common as you become older. Common symptoms of depression  include:  Low or sad mood.  Changes in sleep patterns.  Changes in appetite or eating patterns.  Feeling an overall lack of motivation or enjoyment of activities that you previously enjoyed.  Frequent crying spells.  Talk with your health care provider if you think that you are experiencing depression. What should I know about immunizations? It is important that you get and maintain your immunizations. These include:  Tetanus, diphtheria, and pertussis (Tdap) booster vaccine.  Influenza every year before the flu season begins.  Pneumonia vaccine.  Shingles vaccine.  Your health care provider may also recommend other immunizations. This information is not intended to replace advice given to you by your health care provider. Make sure you discuss any questions you have with your health care provider. Document Released: 03/25/2005  Document Revised: 08/21/2015 Document Reviewed: 11/04/2014 Elsevier Interactive Patient Education  2018 [employer]  

## 2016-10-04 ENCOUNTER — Ambulatory Visit (INDEPENDENT_AMBULATORY_CARE_PROVIDER_SITE_OTHER): Payer: Medicare Other | Admitting: Family Medicine

## 2016-10-04 ENCOUNTER — Other Ambulatory Visit (INDEPENDENT_AMBULATORY_CARE_PROVIDER_SITE_OTHER): Payer: Medicare Other

## 2016-10-04 ENCOUNTER — Encounter: Payer: Self-pay | Admitting: Family Medicine

## 2016-10-04 VITALS — BP 160/78 | HR 95 | Temp 98.4°F | Ht 62.0 in | Wt 186.0 lb

## 2016-10-04 DIAGNOSIS — R140 Abdominal distension (gaseous): Secondary | ICD-10-CM | POA: Insufficient documentation

## 2016-10-04 LAB — COMPREHENSIVE METABOLIC PANEL
ALT: 15 U/L (ref 0–35)
AST: 19 U/L (ref 0–37)
Albumin: 4 g/dL (ref 3.5–5.2)
Alkaline Phosphatase: 80 U/L (ref 39–117)
BUN: 12 mg/dL (ref 6–23)
CO2: 32 mEq/L (ref 19–32)
Calcium: 9.8 mg/dL (ref 8.4–10.5)
Chloride: 94 mEq/L — ABNORMAL LOW (ref 96–112)
Creatinine, Ser: 0.66 mg/dL (ref 0.40–1.20)
GFR: 90.84 mL/min (ref >60.00)
Glucose, Bld: 110 mg/dL — ABNORMAL HIGH (ref 70–99)
Potassium: 3.5 mEq/L (ref 3.5–5.1)
Sodium: 134 mEq/L — ABNORMAL LOW (ref 135–145)
Total Bilirubin: 1 mg/dL (ref 0.2–1.2)
Total Protein: 7.7 g/dL (ref 6.0–8.3)

## 2016-10-04 LAB — CBC WITH DIFFERENTIAL/PLATELET
Basophils Absolute: 0 10*3/uL (ref 0.0–0.1)
Basophils Relative: 0.5 % (ref 0.0–3.0)
Eosinophils Absolute: 0.1 10*3/uL (ref 0.0–0.7)
Eosinophils Relative: 0.7 % (ref 0.0–5.0)
HCT: 41.7 % (ref 36.0–46.0)
Hemoglobin: 13.7 g/dL (ref 12.0–15.0)
Lymphocytes Relative: 13.5 % (ref 12.0–46.0)
Lymphs Abs: 1.3 10*3/uL (ref 0.7–4.0)
MCHC: 32.8 g/dL (ref 30.0–36.0)
MCV: 98.7 fl (ref 78.0–100.0)
Monocytes Absolute: 1.1 10*3/uL — ABNORMAL HIGH (ref 0.1–1.0)
Monocytes Relative: 10.6 % (ref 3.0–12.0)
Neutro Abs: 7.4 10*3/uL (ref 1.4–7.7)
Neutrophils Relative %: 74.7 % (ref 43.0–77.0)
Platelets: 255 10*3/uL (ref 150.0–400.0)
RBC: 4.22 Mil/uL (ref 3.87–5.11)
RDW: 15.5 % (ref 11.5–15.5)
WBC: 10 10*3/uL (ref 4.0–10.5)

## 2016-10-04 NOTE — Progress Notes (Signed)
Reviewed patient's labs.  Rosemarie Ax, MD [facility] 10/04/2016, 3:45 PM

## 2016-10-04 NOTE — Progress Notes (Signed)
Gina [name] - 83 y.o. female MRN [id number]  Date of birth: [birth year]  SUBJECTIVE:  Including CC & ROS.  Chief Complaint  Patient presents with  . Abdominal Pain    pressure on bladder and pateint states she has cramping and diarrhea. paqtient states no visual blood in urine or feces    Gina [name] is an 83 year old female last presenting with abdominal distention. She reports her symptoms have been intermittent for the past few weeks. She feels pressure in her abdomen and has gas. She also reports an increase in the amount of movement she has per day. She has looser movements since her surgery. She denies any blood. She  denies any sick contacts or travel side of the country. This does not feel like a urinary tract infection and she has had a history of those. She denies any nausea or vomiting. Symptoms do not seem to be associated with food.   Review of her blood work from 08/09/16 shows mild hyponatremia but normal cholesterol panel and normal CBC. CT abdomen pelvis from 05/04/2015 shows status post right hemicolectomy with a history of colon cancer.  Review of Systems  Gastrointestinal: Positive for abdominal distention. Negative for abdominal pain, blood in stool, constipation, nausea and vomiting.  Skin: Negative for rash.  Neurological: Negative for weakness and numbness.    HISTORY: Past Medical, Surgical, Social, and Family History Reviewed & Updated per EMR.   Pertinent Historical Findings include:  Past Medical History:  Diagnosis Date  . Allergic rhinitis   . Arthritis   . Colon cancer [facility]) Dec 12  . Diverticulosis of colon (without mention of hemorrhage)   . GI bleed   . Gout   . Hyperlipidemia   . Hyperplastic colon polyp   . Hypertension   . Stroke [facility])    TIA  . Unspecified transient cerebral ischemia sept '11    Past Surgical History:  Procedure Laterality Date  . BACK SURGERY  April '12   benign cyst spine ([place])  . BREAST LUMPECTOMY     right  breast '86  . COLON SURGERY  Dec '12   colectomy for colon cancer ([place])  . TONSILLECTOMY      Allergies  Allergen Reactions  . Aspirin Hives  . Penicillins Hives    Family History  Problem Relation Age of Onset  . Colon cancer Father   . Hypertension Father   . Hyperlipidemia Father   . Coronary artery disease Father   . Ulcers Father   . Heart failure Mother   . Coronary artery disease Mother   . Breast cancer Mother   . Hypertension Mother   . Hyperlipidemia Mother   . Asthma Mother   . Diabetes Neg Hx      Social History   Social History  . Marital status: Widowed    Spouse name: N/A  . Number of children: 3  . Years of education: 14   Occupational History  . librarian Retired    retired   Social History Main Topics  . Smoking status: Former Smoker    Packs/day: 1.50    Years: 30.00    Types: Cigarettes    Quit date: 07/21/1987  . Smokeless tobacco: Never Used  . Alcohol use 8.4 oz/week    14 Glasses of wine per week     Comment: Wine daily  . Drug use: No  . Sexual activity: Not Currently   Other Topics Concern  . Not on file   Social History  Narrative   HSG, Married 1955 widowed 2010. 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 Passed away 09-Jul-2008 Her son  moved to [place] (July '10). End of life care:  no extra ordinary measures: wants cardiac resusitation and mechanical ventilation short-term if needed. Does not want to be kept in a persistant vegative state or long term artifical life support.      PHYSICAL EXAM:  VS: BP (!) 160/78 (BP Location: Left Arm, Patient Position: Sitting, Cuff Size: Normal)   Pulse 95   Temp 98.4 F (36.9 C) (Oral)   Ht 5\' 2"  (1.575 m)   Wt 186 lb (84.4 kg)   SpO2 96%   BMI 34.02 kg/m  Physical Exam Gen: NAD, alert, cooperative with exam,  well-appearing ENT: normal lips, normal nasal mucosa,  Eye: normal EOM, normal conjunctiva and lids CV:  no edema, +2 pedal pulses,regular rate and rhythm, S1-S2    Resp: no accessory muscle use, non-labored, clear to auscultation bilaterally, no rales or wheezes GI: no masses or tenderness, no hernia, soft, nontender, nondistended, positive bowel sounds  Skin: no rashes, no areas of induration  Neuro: normal tone, normal sensation to touch Psych:  normal insight, alert and oriented MSK: Normal gait, normal strength     ASSESSMENT & PLAN:   Abdominal distention May be associated with a viral infection. She looks well on exam today. She has not tried any medications for this problem. Could be associated with certain food. - CBC and CMP today - Can try Imodium - If no improvement can consider a CT abdomen/pelvis with her history of colon cancer.

## 2016-10-04 NOTE — Assessment & Plan Note (Signed)
May be associated with a viral infection. She looks well on exam today. She has not tried any medications for this problem. Could be associated with certain food. - CBC and CMP today - Can try Imodium - If no improvement can consider a CT abdomen/pelvis with her history of colon cancer.

## 2016-10-04 NOTE — Patient Instructions (Addendum)
Thank you for coming in,   We will call you with results from today. You can try Imodium to see if that helps with her symptoms. Please follow-up if you do not have improvement of her symptoms after 3-4 weeks.   Please feel free to call with any questions or concerns at any time, at [phone]. --Dr. Raeford Razor

## 2016-10-10 ENCOUNTER — Telehealth: Payer: Self-pay | Admitting: Gastroenterology

## 2016-10-10 NOTE — Telephone Encounter (Signed)
OK 

## 2016-10-20 ENCOUNTER — Encounter: Payer: Self-pay | Admitting: Physician Assistant

## 2016-10-20 ENCOUNTER — Ambulatory Visit (INDEPENDENT_AMBULATORY_CARE_PROVIDER_SITE_OTHER): Payer: Medicare Other | Admitting: Physician Assistant

## 2016-10-20 ENCOUNTER — Telehealth: Payer: Self-pay | Admitting: *Deleted

## 2016-10-20 VITALS — BP 160/70 | HR 84 | Ht 60.63 in | Wt 187.0 lb

## 2016-10-20 DIAGNOSIS — R1032 Left lower quadrant pain: Secondary | ICD-10-CM

## 2016-10-20 DIAGNOSIS — Z85038 Personal history of other malignant neoplasm of large intestine: Secondary | ICD-10-CM

## 2016-10-20 NOTE — Telephone Encounter (Signed)
10/20/2016   RE: Gina [name] DOB: 10-Jun-1931 MRN: [id number]   Dear Dr. Walker Kehr,    We have scheduled the above patient for an endoscopic procedure. Our records show that she is on anticoagulation therapy.   Please advise as to how long the patient may come off her therapy of Plavix prior to the procedure, which is scheduled for 12-05-2016.  Please  route the Plavix clearance instructions to [place].   Sincerely,    Amy Esterwood PA-C

## 2016-10-20 NOTE — Telephone Encounter (Signed)
OK to stop/re-start Plavix per your protocol Thx

## 2016-10-20 NOTE — Patient Instructions (Signed)

## 2016-10-20 NOTE — Progress Notes (Signed)
Subjective:    Patient ID: Gina [name], female    DOB: 1931-11-10, 83 y.o.   MRN: [id number]  HPI Gina [name] is a very nice 83 year old white female, previously known to Dr. Delfin [name] who has requested to establish with Dr. Fuller [name]. She comes in today with concerns about a recent episode of lower abdominal pain chills and diarrhea which occurred about 3 weeks ago.  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 She is status post lap-assisted right hemicolectomy and did not require any adjuvant therapy. She also has history of hypertension, diverticulosis and a TIA in 2011. She is maintained on Plavix. Patient says her episode of abdominal pain lasted for 2-3 days and then gradually resolved. She describes the pain as a gassy crampy type pain with some tenderness in her lower abdomen which was associated with watery diarrhea with up to 8 bowel movements per day. There is no melena or hematochezia. No nausea or vomiting. She did have chills but no documented fever. She was seen by primary care and had labs done on 10/04/2016 which were unremarkable including a CBC and CMET. She has not had any recurrence of symptoms but has been worried about the lower abdominal pain and whether she may have a recurrence of colon cancer. Last colonoscopy had been done in December 2015 per Dr. Olevia [name] with finding of mild diverticulosis, no recurrent cancer or polyps and follow-up was recommended in 3 years.    Review of Systems Pertinent positive and negative review of systems were noted in the above HPI section.  All other review of systems was otherwise negative.  Outpatient Encounter Prescriptions as of 10/20/2016  Medication Sig  . acetaminophen (TYLENOL) 500 MG tablet Take 500 mg by mouth every 6 (six) hours as needed. For pain  . allopurinol (ZYLOPRIM) 100 MG tablet Take 1 tablet (100 mg total) by mouth daily.  [name] amLODipine (NORVASC)  5 MG tablet Take 1 tablet (5 mg total) by mouth daily.  . Ascorbic Acid (VITAMIN C) 100 MG tablet Take 500 mg by mouth daily.   [name] atorvastatin (LIPITOR) 40 MG tablet Take 1 tablet (40 mg total) by mouth daily.  . Calcium Carbonate (CALCIUM 600 PO) Take 600 mg by mouth 2 (two) times daily.   . clopidogrel (PLAVIX) 75 MG tablet Take 1 tablet (75 mg total) by mouth daily.  . enalapril (VASOTEC) 10 MG tablet Take 1 tablet (10 mg total) by mouth daily.  . hydrochlorothiazide (MICROZIDE) 12.5 MG capsule Take 1 capsule (12.5 mg total) by mouth daily.  . Loratadine 10 MG CAPS Take 10 mg by mouth daily.   . MULTIPLE VITAMIN PO Take 1 tablet by mouth daily.   . Multiple Vitamins-Minerals (OCUVITE PRESERVISION) TABS Take 1 tablet by mouth daily.   . pramoxine-hydrocortisone (PROCTOCREAM-HC) 1-1 % rectal cream Place 1 application rectally 2 (two) times daily.   No facility-administered encounter medications on file as of 10/20/2016.    Allergies  Allergen Reactions  . Aspirin Hives  . Penicillins Hives   Patient Active Problem List   Diagnosis Date Noted  . Abdominal distention 10/04/2016  . Elevated glucose 02/18/2016  . Chronic venous insufficiency 03/17/2015  . Dysuria 05/17/2014  . Travel advice encounter 03/18/2014  . Osteopenia 03/18/2014  . Well adult exam 09/07/2013  . Routine health maintenance 09/04/2011  . Left otitis media 03/31/2011  . Vertigo 03/31/2011  . Cancer of right colon ([place])  01/17/2011  . Back pain 10/25/2010  . Unspecified transient cerebral ischemia 11/17/2009  . HIP PAIN, LEFT 07/22/2009  . DIVERTICULOSIS, COLON 04/24/2008  . COLONIC POLYPS, HYPERPLASTIC, HX OF 04/24/2008  . Dyslipidemia 09/08/2006  . Gout 09/08/2006  . Essential hypertension 09/08/2006  . ALLERGIC RHINITIS 09/08/2006   Social History   Social History  . Marital status: Widowed    Spouse name: N/A  . Number of children: 3  . Years of education: 14   Occupational History  . librarian  Retired    retired   Social History Main Topics  . Smoking status: Former Smoker    Packs/day: 1.50    Years: 30.00    Types: Cigarettes    Quit date: 07/21/1987  . Smokeless tobacco: Never Used  . Alcohol use 8.4 oz/week    14 Glasses of wine per week     Comment: Wine daily  . Drug use: No  . Sexual activity: Not Currently   Other Topics Concern  . Not on file   Social History Narrative   HSG, Married 1955 widowed 2010. 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 Passed away 25-Jun-2008 Her son  moved to [place] (July '10). End of life care:  no extra ordinary measures: wants cardiac resusitation and mechanical ventilation short-term if needed. Does not want to be kept in a persistant vegative state or long term artifical life support.     Ms. Gina [name] family history includes Asthma in her mother; Breast cancer in her mother; Colon cancer in her father; Coronary artery disease in her father and mother; Heart failure in her mother; Hyperlipidemia in her father and mother; Hypertension in her father and mother; Ulcers in her father.      Objective:    Vitals:   10/20/16 0958  BP: (!) 160/70  Pulse: 84    Physical Exam  well-developed elderly white female in no acute distress, pleasant blood pressure 160/70, height 5 foot, weight 187, BMI of 35.7. HEENT; nontraumatic normocephalic EOMI PERRLA sclera anicteric, Cardiovascular; regular rate and rhythm with S1-S2, Pulmonary; clear bilaterally, Abdomen; large, soft line incisional scars, no palpable mass or hepatosplenomegaly she's nontender bowel sounds present, Rectal ;exam not done, Extremities; no clubbing cyanosis or edema skin warm and dry, Neuropsych; mood and affect appropriate       Assessment & [name]:   #79 83 year old white female with history of T3 N0  poorly differentiated adenocarcinoma of the ascending colon diagnosed December 2012, status post lap-assisted right hemicolectomy. Patient had follow-up colonoscopies, the last done in December 2015 with no evidence of recurrent cancer or polyps. Patient comes in today after a recent episode of lower abdominal pain diarrhea and chills which lasted about 3 days. She's currently asymptomatic. I suspect this was an acute gastroenteritis. She did not have any abdominal distention nausea or vomiting to suggest obstruction.  Patient is due for follow-up surveillance colonoscopy. #2 diverticulosis #3 hypertension #4 of TIA 2011 #5 chronic antiplatelet therapy-on Plavix  [name]; Patient will be scheduled for colonoscopy with Dr. Fuller [name]. Procedure discussed in detail with patient including risks and benefits and she is agreeable to proceed. Patient will need to hold Plavix for 5 days prior to the colonoscopy. We will communicate with her PCP Dr. Alain Marion  to assure that this is reasonable for this patient. She mentions that she has  been undergoing epidural injections and has been off and on Plavix over the past several months.   Meghen Akopyan S Renetta Suman PA-C 10/20/2016   Cc: Plotnikov, Evie Lacks, MD

## 2016-10-21 DIAGNOSIS — M5416 Radiculopathy, lumbar region: Secondary | ICD-10-CM | POA: Diagnosis not present

## 2016-10-21 DIAGNOSIS — M47816 Spondylosis without myelopathy or radiculopathy, lumbar region: Secondary | ICD-10-CM | POA: Diagnosis not present

## 2016-10-21 DIAGNOSIS — M5136 Other intervertebral disc degeneration, lumbar region: Secondary | ICD-10-CM | POA: Diagnosis not present

## 2016-10-21 DIAGNOSIS — M4316 Spondylolisthesis, lumbar region: Secondary | ICD-10-CM | POA: Diagnosis not present

## 2016-10-21 DIAGNOSIS — M48062 Spinal stenosis, lumbar region with neurogenic claudication: Secondary | ICD-10-CM | POA: Diagnosis not present

## 2016-10-21 NOTE — Progress Notes (Signed)
Reviewed and agree with management plan.  Disney Ruggiero T. Kismet Facemire, MD FACG 

## 2016-10-21 NOTE — Telephone Encounter (Signed)
Called and advised the patient she can hold the Plavix 5 days prior to the colonoscopy date of 12-05-2016. She said she will already be off due to an epidural she will be having. She thanked me for letting her know. She said he doctor for the epidural will let her know when to start the Plavix again.

## 2016-10-25 DIAGNOSIS — H10413 Chronic giant papillary conjunctivitis, bilateral: Secondary | ICD-10-CM | POA: Diagnosis not present

## 2016-10-25 DIAGNOSIS — H353134 Nonexudative age-related macular degeneration, bilateral, advanced atrophic with subfoveal involvement: Secondary | ICD-10-CM | POA: Diagnosis not present

## 2016-10-25 DIAGNOSIS — Z961 Presence of intraocular lens: Secondary | ICD-10-CM | POA: Diagnosis not present

## 2016-10-28 DIAGNOSIS — L8990 Pressure ulcer of unspecified site, unspecified stage: Secondary | ICD-10-CM | POA: Diagnosis not present

## 2016-11-07 ENCOUNTER — Telehealth: Payer: Self-pay | Admitting: Internal Medicine

## 2016-11-07 DIAGNOSIS — Z23 Encounter for immunization: Secondary | ICD-10-CM | POA: Diagnosis not present

## 2016-11-07 NOTE — Telephone Encounter (Signed)
Pt would like a call back, she is still having swelling in her legs and she would like to know if her dieretic med needs to be increased, she stated it has not been increased in 7 years. Please advise and call back   Also she informed us she recieved her flu shot today

## 2016-11-08 MED ORDER — TRIAMTERENE-HCTZ 37.5-25 MG PO TABS: 1.0000 | ORAL_TABLET | Freq: Every day | ORAL | 11 refills | Status: DC

## 2016-11-08 NOTE — Telephone Encounter (Signed)
D/c HCTZ Start Maxzide 1 a day (Rx emailed) RTC 2 wks Thx

## 2016-11-08 NOTE — Telephone Encounter (Signed)
Notified pt w/MD response. Made f/u appt for 11/24/16...[name]

## 2016-11-24 ENCOUNTER — Ambulatory Visit (INDEPENDENT_AMBULATORY_CARE_PROVIDER_SITE_OTHER): Payer: Medicare Other | Admitting: Internal Medicine

## 2016-11-24 ENCOUNTER — Encounter: Payer: Self-pay | Admitting: Internal Medicine

## 2016-11-24 DIAGNOSIS — M109 Gout, unspecified: Secondary | ICD-10-CM

## 2016-11-24 DIAGNOSIS — K5790 Diverticulosis of intestine, part unspecified, without perforation or abscess without bleeding: Secondary | ICD-10-CM | POA: Diagnosis not present

## 2016-11-24 DIAGNOSIS — I10 Essential (primary) hypertension: Secondary | ICD-10-CM

## 2016-11-24 DIAGNOSIS — R7309 Other abnormal glucose: Secondary | ICD-10-CM

## 2016-11-24 DIAGNOSIS — C182 Malignant neoplasm of ascending colon: Secondary | ICD-10-CM

## 2016-11-24 MED ORDER — TRIAMTERENE-HCTZ 37.5-25 MG PO TABS
1.0000 | ORAL_TABLET | Freq: Every day | ORAL | 3 refills | Status: DC
Start: 2016-11-24 — End: 2017-09-04

## 2016-11-24 NOTE — Assessment & Plan Note (Signed)
labs

## 2016-11-24 NOTE — Assessment & Plan Note (Signed)
-   colon 12/05/16 Dr Fuller Plan

## 2016-11-24 NOTE — Assessment & Plan Note (Signed)
On Allopurinol - no relapse

## 2016-11-24 NOTE — Assessment & Plan Note (Signed)
Better on Norvasc, Enalapril, Triamt/HCTZ combination

## 2016-11-24 NOTE — Progress Notes (Signed)
Subjective:  Patient ID: Gina [name], female    DOB: [birth year]  Age: 83 y.o. MRN: [id number]  CC: No chief complaint on file.   HPI LATREECE MOCHIZUKI presents for abd distention - colon 12/05/16 Dr Fuller Plan Epidural w/dr Sherwood Gambler pending for LBP F/u HTN  Outpatient Medications Prior to Visit  Medication Sig Dispense Refill  . acetaminophen (TYLENOL) 500 MG tablet Take 500 mg by mouth every 6 (six) hours as needed. For pain    . allopurinol (ZYLOPRIM) 100 MG tablet Take 1 tablet (100 mg total) by mouth daily. 90 tablet 3  . amLODipine (NORVASC) 5 MG tablet Take 1 tablet (5 mg total) by mouth daily. 90 tablet 3  . Ascorbic Acid (VITAMIN C) 100 MG tablet Take 500 mg by mouth daily.     [name] atorvastatin (LIPITOR) 40 MG tablet Take 1 tablet (40 mg total) by mouth daily. 90 tablet 3  . Calcium Carbonate (CALCIUM 600 PO) Take 600 mg by mouth 2 (two) times daily.     . clopidogrel (PLAVIX) 75 MG tablet Take 1 tablet (75 mg total) by mouth daily. 90 tablet 3  . enalapril (VASOTEC) 10 MG tablet Take 1 tablet (10 mg total) by mouth daily. 90 tablet 3  . Loratadine 10 MG CAPS Take 10 mg by mouth daily.     . MULTIPLE VITAMIN PO Take 1 tablet by mouth daily.     . Multiple Vitamins-Minerals (OCUVITE PRESERVISION) TABS Take 1 tablet by mouth daily.     . pramoxine-hydrocortisone (PROCTOCREAM-HC) 1-1 % rectal cream Place 1 application rectally 2 (two) times daily. 30 g 1  . triamterene-hydrochlorothiazide (MAXZIDE-25) 37.5-25 MG tablet Take 1 tablet by mouth daily. 30 tablet 11   No facility-administered medications prior to visit.     ROS Review of Systems  Constitutional: Positive for fatigue. Negative for activity change, appetite change, chills and unexpected weight change.  HENT: Negative for congestion, mouth sores and sinus pressure.   Eyes: Positive for visual disturbance.  Respiratory: Negative for cough, chest tightness and shortness of breath.   Cardiovascular: Negative for leg  swelling.  Gastrointestinal: Negative for abdominal pain and nausea.  Genitourinary: Negative for difficulty urinating, frequency and vaginal pain.  Musculoskeletal: Positive for back pain and gait problem.  Skin: Negative for pallor and rash.  Neurological: Negative for dizziness, tremors, weakness, numbness and headaches.  Psychiatric/Behavioral: Negative for confusion and sleep disturbance.    Objective:  BP 132/74 (BP Location: Left Arm, Patient Position: Sitting, Cuff Size: Large)   Pulse 85   Temp 98.3 F (36.8 C) (Oral)   Ht 5\' 2"  (4.098 m)   Wt 185 lb (83.9 kg)   SpO2 98%   BMI 33.84 kg/m   BP Readings from Last 3 Encounters:  11/24/16 132/74  10/20/16 (!) 160/70  10/04/16 (!) 160/78    Wt Readings from Last 3 Encounters:  11/24/16 185 lb (83.9 kg)  10/20/16 187 lb (84.8 kg)  10/04/16 186 lb (84.4 kg)    Physical Exam  Constitutional: She appears well-developed. No distress.  HENT:  Head: Normocephalic.  Right Ear: External ear normal.  Left Ear: External ear normal.  Nose: Nose normal.  Mouth/Throat: Oropharynx is clear and moist.  Eyes: Pupils are equal, round, and reactive to light. Conjunctivae are normal. Right eye exhibits no discharge. Left eye exhibits no discharge.  Neck: Normal range of motion. Neck supple. No JVD present. No tracheal deviation present. No thyromegaly present.  Cardiovascular: Normal rate, regular rhythm  and normal heart sounds.   Pulmonary/Chest: No stridor. No respiratory distress. She has no wheezes.  Abdominal: Soft. Bowel sounds are normal. She exhibits no distension and no mass. There is no tenderness. There is no rebound and no guarding.  Musculoskeletal: She exhibits no edema or tenderness.  Lymphadenopathy:    She has no cervical adenopathy.  Neurological: She displays normal reflexes. No cranial nerve deficit. She exhibits normal muscle tone. Coordination abnormal.  Skin: No rash noted. No erythema.  Psychiatric: She has a  normal mood and affect. Her behavior is normal. Judgment and thought content normal.    Lab Results  Component Value Date   WBC 10.0 10/04/2016   HGB 13.7 10/04/2016   HCT 41.7 10/04/2016   PLT 255.0 10/04/2016   GLUCOSE 110 (H) 10/04/2016   CHOL 176 08/09/2016   TRIG 115.0 08/09/2016   HDL 78.90 08/09/2016   LDLDIRECT 69.0 09/09/2014   LDLCALC 74 08/09/2016   ALT 15 10/04/2016   AST 19 10/04/2016   NA 134 (L) 10/04/2016   K 3.5 10/04/2016   CL 94 (L) 10/04/2016   CREATININE 0.66 10/04/2016   BUN 12 10/04/2016   CO2 32 10/04/2016   TSH 1.44 08/09/2016   INR 0.94 06/07/2010   HGBA1C 5.8 02/18/2016    Ct Chest W Contrast  Result Date: 05/06/2015 CLINICAL DATA:  Colon cancer diagnosed 01/2011, status post partial colectomy EXAM: CT CHEST, ABDOMEN, AND PELVIS WITH CONTRAST TECHNIQUE: Multidetector CT imaging of the chest, abdomen and pelvis was performed following the standard protocol during bolus administration of intravenous contrast. CONTRAST:  134mL ISOVUE-300 IOPAMIDOL (ISOVUE-300) INJECTION 61% COMPARISON:  CT abdomen pelvis dated 05/07/2014. CT chest abdomen pelvis dated 06/11/2013. FINDINGS: CT CHEST FINDINGS Mediastinum/Nodes: Heart is normal in size. No pericardial effusion. Three vessel coronary atherosclerosis. Atherosclerotic calcifications of the aortic arch. No suspicious mediastinal lymphadenopathy. Visualized thyroid is unremarkable. Lungs/Pleura: Lungs are essentially clear. No suspicious pulmonary nodules. Stable subpleural nodular scarring in the left lower lobe (series 4/image 36). Stable 3 mm subpleural nodule in the right lower lobe (series 4/image 25). Mild lingular scarring.  No focal consolidation. No pleural effusion or pneumothorax. Musculoskeletal: Degenerative changes of the thoracic spine. CT ABDOMEN PELVIS FINDINGS Hepatobiliary: The liver is within normal limits. No suspicious/enhancing hepatic lesions. Gallbladder is unremarkable. No intrahepatic or  extrahepatic ductal dilatation. Pancreas: Within normal limits. Spleen:  Within normal limits. Adrenals/Urinary Tract: Adrenal glands are within normal limits. Small bilateral renal cysts, measuring up to 1.6 cm along the right upper pole (series 2/ image 58). No hydronephrosis. Bladder is within normal limits. Stomach/Bowel: Stomach is within normal limits. Status post right hemicolectomy. Extensive sigmoid diverticulosis, without evidence of diverticulitis. No evidence of bowel obstruction. Vascular/Lymphatic: Atherosclerotic calcifications of the abdominal aorta and branch vessels. No evidence of abdominal aortic aneurysm. No suspicious abdominopelvic lymphadenopathy. Reproductive: Uterus is unremarkable. Bilateral ovaries are within normal limits. Other: No abdominopelvic ascites. Musculoskeletal: Degenerative changes of the lumbar spine. IMPRESSION: Status post right hemicolectomy. No evidence of recurrent or metastatic disease. Electronically Signed   By: Julian Hy M.D.   On: 05/06/2015 14:28   Ct Abdomen Pelvis W Contrast  Result Date: 05/06/2015 CLINICAL DATA:  Colon cancer diagnosed 01/2011, status post partial colectomy EXAM: CT CHEST, ABDOMEN, AND PELVIS WITH CONTRAST TECHNIQUE: Multidetector CT imaging of the chest, abdomen and pelvis was performed following the standard protocol during bolus administration of intravenous contrast. CONTRAST:  150mL ISOVUE-300 IOPAMIDOL (ISOVUE-300) INJECTION 61% COMPARISON:  CT abdomen pelvis dated 05/07/2014. CT  chest abdomen pelvis dated 06/11/2013. FINDINGS: CT CHEST FINDINGS Mediastinum/Nodes: Heart is normal in size. No pericardial effusion. Three vessel coronary atherosclerosis. Atherosclerotic calcifications of the aortic arch. No suspicious mediastinal lymphadenopathy. Visualized thyroid is unremarkable. Lungs/Pleura: Lungs are essentially clear. No suspicious pulmonary nodules. Stable subpleural nodular scarring in the left lower lobe (series 4/image  36). Stable 3 mm subpleural nodule in the right lower lobe (series 4/image 25). Mild lingular scarring.  No focal consolidation. No pleural effusion or pneumothorax. Musculoskeletal: Degenerative changes of the thoracic spine. CT ABDOMEN PELVIS FINDINGS Hepatobiliary: The liver is within normal limits. No suspicious/enhancing hepatic lesions. Gallbladder is unremarkable. No intrahepatic or extrahepatic ductal dilatation. Pancreas: Within normal limits. Spleen:  Within normal limits. Adrenals/Urinary Tract: Adrenal glands are within normal limits. Small bilateral renal cysts, measuring up to 1.6 cm along the right upper pole (series 2/ image 58). No hydronephrosis. Bladder is within normal limits. Stomach/Bowel: Stomach is within normal limits. Status post right hemicolectomy. Extensive sigmoid diverticulosis, without evidence of diverticulitis. No evidence of bowel obstruction. Vascular/Lymphatic: Atherosclerotic calcifications of the abdominal aorta and branch vessels. No evidence of abdominal aortic aneurysm. No suspicious abdominopelvic lymphadenopathy. Reproductive: Uterus is unremarkable. Bilateral ovaries are within normal limits. Other: No abdominopelvic ascites. Musculoskeletal: Degenerative changes of the lumbar spine. IMPRESSION: Status post right hemicolectomy. No evidence of recurrent or metastatic disease. Electronically Signed   By: Julian Hy M.D.   On: 05/06/2015 14:28    Assessment & Plan:   There are no diagnoses linked to this encounter. I am having Ms. Hiers maintain her Loratadine, Calcium Carbonate (CALCIUM 600 PO), vitamin C, MULTIPLE VITAMIN PO, OCUVITE PRESERVISION, acetaminophen, pramoxine-hydrocortisone, clopidogrel, atorvastatin, amLODipine, allopurinol, enalapril, and triamterene-hydrochlorothiazide.  No orders of the defined types were placed in this encounter.    Follow-up: No Follow-up on file.  Walker Kehr, MD

## 2016-11-24 NOTE — Patient Instructions (Signed)
MC well w/Jill next year

## 2016-12-05 ENCOUNTER — Encounter: Payer: Self-pay | Admitting: Gastroenterology

## 2016-12-05 ENCOUNTER — Encounter: Payer: Medicare Other | Admitting: Gastroenterology

## 2016-12-05 ENCOUNTER — Ambulatory Visit (AMBULATORY_SURGERY_CENTER): Payer: Medicare Other | Admitting: Gastroenterology

## 2016-12-05 VITALS — BP 166/74 | HR 85 | Temp 99.3°F | Resp 22 | Ht 60.0 in | Wt 187.0 lb

## 2016-12-05 DIAGNOSIS — Z85038 Personal history of other malignant neoplasm of large intestine: Secondary | ICD-10-CM | POA: Diagnosis not present

## 2016-12-05 DIAGNOSIS — D125 Benign neoplasm of sigmoid colon: Secondary | ICD-10-CM

## 2016-12-05 DIAGNOSIS — R51 Headache: Secondary | ICD-10-CM | POA: Diagnosis not present

## 2016-12-05 DIAGNOSIS — Z08 Encounter for follow-up examination after completed treatment for malignant neoplasm: Secondary | ICD-10-CM

## 2016-12-05 MED ORDER — SODIUM CHLORIDE 0.9 % IV SOLN: 500.0000 mL | INTRAVENOUS | Status: DC

## 2016-12-05 NOTE — Patient Instructions (Signed)
**   Handouts given on polyps and diverticulosis ** Resume Plavix according to managing physician.   YOU HAD AN ENDOSCOPIC PROCEDURE TODAY AT [facility]:   Refer to the procedure report that was given to you for any specific questions about what was found during the examination.  If the procedure report does not answer your questions, please call your gastroenterologist to clarify.  If you requested that your care partner not be given the details of your procedure findings, then the procedure report has been included in a sealed envelope for you to review at your convenience later.  YOU SHOULD EXPECT: Some feelings of bloating in the abdomen. Passage of more gas than usual.  Walking can help get rid of the air that was put into your GI tract during the procedure and reduce the bloating. If you had a lower endoscopy (such as a colonoscopy or flexible sigmoidoscopy) you may notice spotting of blood in your stool or on the toilet paper. If you underwent a bowel prep for your procedure, you may not have a normal bowel movement for a few days.  Please Note:  You might notice some irritation and congestion in your nose or some drainage.  This is from the oxygen used during your procedure.  There is no need for concern and it should clear up in a day or so.  SYMPTOMS TO REPORT IMMEDIATELY:   Following lower endoscopy (colonoscopy or flexible sigmoidoscopy):  Excessive amounts of blood in the stool  Significant tenderness or worsening of abdominal pains  Swelling of the abdomen that is new, acute  Fever of 100F or higher  For urgent or emergent issues, a gastroenterologist can be reached at any hour by calling [phone].   DIET:  We do recommend a small meal at first, but then you may proceed to your regular diet.  Drink plenty of fluids but you should avoid alcoholic beverages for 24 hours.  ACTIVITY:  You should plan to take it easy for the rest of today and you should NOT DRIVE  or use heavy machinery until tomorrow (because of the sedation medicines used during the test).    FOLLOW UP: Our staff will call the number listed on your records the next business day following your procedure to check on you and address any questions or concerns that you may have regarding the information given to you following your procedure. If we do not reach you, we will leave a message.  However, if you are feeling well and you are not experiencing any problems, there is no need to return our call.  We will assume that you have returned to your regular daily activities without incident.  If any biopsies were taken you will be contacted by phone or by letter within the next 1-3 weeks.  Please call us at [phone] if you have not heard about the biopsies in 3 weeks.    SIGNATURES/CONFIDENTIALITY: You and/or your care partner have signed paperwork which will be entered into your electronic medical record.  These signatures attest to the fact that that the information above on your After Visit Summary has been reviewed and is understood.  Full responsibility of the confidentiality of this discharge information lies with you and/or your care-partner.

## 2016-12-05 NOTE — Op Note (Signed)
[place] Patient Name: Gina [name] Procedure Date: 12/05/2016 3:40 PM MRN: [id number] Endoscopist: Ladene Artist , MD Age: 83 Referring MD:  Date of Birth: [birth year] Gender: Female Account #: [id number] Procedure:                Colonoscopy Indications:              High risk colon cancer surveillance: Personal                            history of colon cancer Medicines:                Monitored Anesthesia Care Procedure:                Pre-Anesthesia Assessment:                           - Prior to the procedure, a History and Physical                            was performed, and patient medications and                            allergies were reviewed. The patient's tolerance of                            previous anesthesia was also reviewed. The risks                            and benefits of the procedure and the sedation                            options and risks were discussed with the patient.                            All questions were answered, and informed consent                            was obtained. Prior Anticoagulants: The patient has                            taken Plavix (clopidogrel), last dose was 6 days                            prior to procedure. ASA Grade Assessment: III - A                            patient with severe systemic disease. After                            reviewing the risks and benefits, the patient was                            deemed in satisfactory condition to undergo the  procedure.                           After obtaining informed consent, the colonoscope                            was passed under direct vision. Throughout the                            procedure, the patient's blood pressure, pulse, and                            oxygen saturations were monitored continuously. The                            Colonoscope was introduced through the anus and        advanced to the the ileocolonic anastomosis. The                            rectum was photographed. The quality of the bowel                            preparation was good. The colonoscopy was performed                            without difficulty. The patient tolerated the                            procedure well. Scope In: 3:50:15 PM Scope Out: 4:03:15 PM Scope Withdrawal Time: 0 hours 10 minutes 45 seconds  Total Procedure Duration: 0 hours 13 minutes 0 seconds  Findings:                 The perianal and digital rectal examinations were                            normal.                           There was evidence of a prior end-to-side                            ileo-colonic anastomosis at the hepatic flexure.                            This was patent and was characterized by healthy                            appearing mucosa. The anastomosis was not traversed.                           A 4 mm polyp was found in the sigmoid colon. The                            polyp was sessile. The polyp was removed with  a                            cold snare. Resection and retrieval were complete.                           Multiple medium-mouthed diverticula were found in                            the left colon. There was no evidence of                            diverticular bleeding.                           Internal hemorrhoids were found during                            retroflexion. The hemorrhoids were medium-sized and                            Grade I (internal hemorrhoids that do not prolapse).                           The exam was otherwise without abnormality on                            direct and retroflexion views. Complications:            No immediate complications. Estimated blood loss:                            None. Estimated Blood Loss:     Estimated blood loss: none. Impression:               - Patent end-to-side ileo-colonic anastomosis,                             characterized by healthy appearing mucosa.                           - One 4 mm polyp in the sigmoid colon, removed with                            a cold snare. Resected and retrieved.                           - Moderate diverticulosis in the left colon. There                            was no evidence of diverticular bleeding.                           - Internal hemorrhoids.                           - The examination was  otherwise normal on direct                            and retroflexion views. Recommendation:           - OK to resume Plavix (clopidogrel) tomorrow at                            prior dose from a GI standpoint. She has another                            procedure scheduled for Thursday and will remain of                            Plavix for that procedure per instruction from that                            physician. Refer to managing physician for further                            adjustment of therapy.                           - Patient has a contact number available for                            emergencies. The signs and symptoms of potential                            delayed complications were discussed with the                            patient. Return to normal activities tomorrow.                            Written discharge instructions were provided to the                            patient.                           - Resume previous diet.                           - Continue present medications.                           - Await pathology results.                           - No repeat colonoscopy due to age. Ladene Artist, MD 12/05/2016 4:09:03 PM This report has been signed electronically.

## 2016-12-05 NOTE — Progress Notes (Signed)
Transfer to [facility], VSS Report to RN.tb

## 2016-12-05 NOTE — Progress Notes (Signed)
Called to room to assist during endoscopic procedure.  Patient ID and intended procedure confirmed with present staff. Received instructions for my participation in the procedure from the performing physician.  

## 2016-12-06 ENCOUNTER — Telehealth: Payer: Self-pay | Admitting: *Deleted

## 2016-12-06 NOTE — Telephone Encounter (Signed)
  Follow up Call-  Call back number 12/05/2016  Post procedure Call Back phone  # [phone]  Permission to leave phone message Yes  Some recent data might be hidden     Patient questions:  Do you have a fever, pain , or abdominal swelling? No. Pain Score  0 *  Have you tolerated food without any problems? Yes.    Have you been able to return to your normal activities? Yes.    Do you have any questions about your discharge instructions: Diet   No. Medications  No. Follow up visit  No.  Do you have questions or concerns about your Care? No.  Actions: * If pain score is 4 or above: No action needed, pain <4.

## 2016-12-08 DIAGNOSIS — M5136 Other intervertebral disc degeneration, lumbar region: Secondary | ICD-10-CM | POA: Diagnosis not present

## 2016-12-08 DIAGNOSIS — M48061 Spinal stenosis, lumbar region without neurogenic claudication: Secondary | ICD-10-CM | POA: Diagnosis not present

## 2016-12-08 DIAGNOSIS — M4726 Other spondylosis with radiculopathy, lumbar region: Secondary | ICD-10-CM | POA: Diagnosis not present

## 2016-12-16 ENCOUNTER — Telehealth: Payer: Self-pay | Admitting: Gastroenterology

## 2016-12-16 NOTE — Telephone Encounter (Signed)
Pt calling for biopsy results. Please advise.

## 2016-12-18 ENCOUNTER — Encounter: Payer: Self-pay | Admitting: Gastroenterology

## 2016-12-18 NOTE — Telephone Encounter (Signed)
We tell patients to expect a letter with your pathology result about 2 week after the procedure. See path letter.

## 2016-12-19 NOTE — Telephone Encounter (Signed)
The pt has been advised of the path and advised the letter has been mailed to the pt    [place] [id number]   Dear Ms. Messamore,  The polyp removed from your colon was benign, but precancerous. This means that it had the potential to change into cancer over time.  If you develop any new rectal bleeding, abdominal pain or significant bowel habit changes, please contact us before then at Dept: [phone].  Please call us if you have persistent problems or have questions about your condition that have not been fully answered at this time.  Sincerely,  Ladene Artist, MD

## 2017-01-12 DIAGNOSIS — L308 Other specified dermatitis: Secondary | ICD-10-CM | POA: Diagnosis not present

## 2017-01-19 ENCOUNTER — Telehealth: Payer: Self-pay | Admitting: Internal Medicine

## 2017-01-19 NOTE — Telephone Encounter (Signed)
Copied from [place]. Topic: Quick Communication - Rx Refill/Question >> Jan 19, 2017 10:03 AM Scherrie Gerlach wrote: Pt would like a cough med called into her pharmacy. Pt states she cannot get into the office, uses [facility] and they require advance notice. Using OTC robitussin dm and doesn't help with the night cough. Pt states it is a clear productive cough   Preferred Pharmacy (with phone number or street name): [facility] - [place], [address] AT [facility] [address] [phone] (Phone) [phone] (Fax)  Pt has appt next month with dr Laurian Brim

## 2017-01-20 MED ORDER — PROMETHAZINE-CODEINE 6.25-10 MG/5ML PO SYRP: 5.0000 mL | ORAL_SOLUTION | ORAL | 0 refills | Status: DC | PRN

## 2017-01-20 NOTE — Telephone Encounter (Signed)
OK Prom-cod - done OV if not better

## 2017-01-20 NOTE — Telephone Encounter (Signed)
Notified pt w/MD response rx sent to [facility]...Gina [name]

## 2017-02-09 ENCOUNTER — Telehealth: Payer: Self-pay | Admitting: Internal Medicine

## 2017-02-09 DIAGNOSIS — I739 Peripheral vascular disease, unspecified: Secondary | ICD-10-CM

## 2017-02-09 NOTE — Telephone Encounter (Signed)
Please advise 

## 2017-02-09 NOTE — Telephone Encounter (Signed)
Ok Thx 

## 2017-02-09 NOTE — Telephone Encounter (Signed)
Copied from [place]. Topic: Referral - Request >> Feb 09, 2017 11:13 AM Hewitt Shorts wrote: Reason for CRM:pt is needing to talk with someone about getting a referral to have her circulation tested and that the date she will have a ride will be 02/16/17 so that would be the best date for her   Best number [phone]   Spoke with patient. She states she has already talked with Dr.Plotnikov about the referral. She is ready for him to place the referral, That is why she called. And when we make her appointment she would like it on 02/16/17. I informed we may not be able to do that, A referral can take 7-14 days.

## 2017-02-10 ENCOUNTER — Telehealth: Payer: Self-pay

## 2017-02-10 DIAGNOSIS — I739 Peripheral vascular disease, unspecified: Secondary | ICD-10-CM

## 2017-02-10 NOTE — Telephone Encounter (Signed)
-----   Message from Rufina Falco sent at 02/10/2017 10:10 AM EST ----- Regarding: Additional Order Needed Our location will need an order for ABI's also since Dr. Alain Marion order an arterial duplex.   If you can get this entered today, I will contact the patient for an appointment for 01/03 as requested.      [place] [phone]

## 2017-02-10 NOTE — Telephone Encounter (Signed)
Order entered

## 2017-02-15 ENCOUNTER — Other Ambulatory Visit (INDEPENDENT_AMBULATORY_CARE_PROVIDER_SITE_OTHER): Payer: Medicare Other

## 2017-02-15 ENCOUNTER — Other Ambulatory Visit: Payer: Self-pay

## 2017-02-15 DIAGNOSIS — I10 Essential (primary) hypertension: Secondary | ICD-10-CM

## 2017-02-15 DIAGNOSIS — E785 Hyperlipidemia, unspecified: Secondary | ICD-10-CM

## 2017-02-15 LAB — URINALYSIS, ROUTINE W REFLEX MICROSCOPIC
Bilirubin Urine: NEGATIVE
Hgb urine dipstick: NEGATIVE
Ketones, ur: NEGATIVE
Nitrite: NEGATIVE
RBC / HPF: NONE SEEN (ref 0–?)
Specific Gravity, Urine: 1.01 (ref 1.000–1.030)
Total Protein, Urine: NEGATIVE
Urine Glucose: NEGATIVE
Urobilinogen, UA: 0.2 (ref 0.0–1.0)
pH: 7 (ref 5.0–8.0)

## 2017-02-15 LAB — CBC WITH DIFFERENTIAL/PLATELET
Basophils Absolute: 0.1 10*3/uL (ref 0.0–0.1)
Basophils Relative: 0.9 % (ref 0.0–3.0)
Eosinophils Absolute: 0.3 10*3/uL (ref 0.0–0.7)
Eosinophils Relative: 3.8 % (ref 0.0–5.0)
HCT: 39.4 % (ref 36.0–46.0)
Hemoglobin: 13.1 g/dL (ref 12.0–15.0)
Lymphocytes Relative: 21.3 % (ref 12.0–46.0)
Lymphs Abs: 1.5 10*3/uL (ref 0.7–4.0)
MCHC: 33.2 g/dL (ref 30.0–36.0)
MCV: 101.6 fl — ABNORMAL HIGH (ref 78.0–100.0)
Monocytes Absolute: 0.7 10*3/uL (ref 0.1–1.0)
Monocytes Relative: 10.8 % (ref 3.0–12.0)
Neutro Abs: 4.3 10*3/uL (ref 1.4–7.7)
Neutrophils Relative %: 63.2 % (ref 43.0–77.0)
Platelets: 201 10*3/uL (ref 150.0–400.0)
RBC: 3.88 Mil/uL (ref 3.87–5.11)
RDW: 15.8 % — ABNORMAL HIGH (ref 11.5–15.5)
WBC: 6.9 10*3/uL (ref 4.0–10.5)

## 2017-02-15 LAB — COMPREHENSIVE METABOLIC PANEL
ALT: 15 U/L (ref 0–35)
AST: 21 U/L (ref 0–37)
Albumin: 4.2 g/dL (ref 3.5–5.2)
Alkaline Phosphatase: 70 U/L (ref 39–117)
BUN: 19 mg/dL (ref 6–23)
CO2: 30 mEq/L (ref 19–32)
Calcium: 9.4 mg/dL (ref 8.4–10.5)
Chloride: 98 mEq/L (ref 96–112)
Creatinine, Ser: 0.75 mg/dL (ref 0.40–1.20)
GFR: 78.31 mL/min (ref >60.00)
Glucose, Bld: 95 mg/dL (ref 70–99)
Potassium: 4.2 mEq/L (ref 3.5–5.1)
Sodium: 138 mEq/L (ref 135–145)
Total Bilirubin: 0.8 mg/dL (ref 0.2–1.2)
Total Protein: 7 g/dL (ref 6.0–8.3)

## 2017-02-15 LAB — LIPID PANEL
Cholesterol: 142 mg/dL (ref 0–200)
HDL: 61.4 mg/dL (ref >39.00)
LDL Cholesterol: 53 mg/dL (ref 0–99)
NonHDL: 80.81
Total CHOL/HDL Ratio: 2
Triglycerides: 141 mg/dL (ref 0.0–149.0)
VLDL: 28.2 mg/dL (ref 0.0–40.0)

## 2017-02-16 ENCOUNTER — Ambulatory Visit (HOSPITAL_COMMUNITY)
Admission: RE | Admit: 2017-02-16 | Discharge: 2017-02-16 | Disposition: A | Discharge status: Home / Self Care | Payer: Medicare Other | Source: Ambulatory Visit | Attending: Vascular Surgery | Admitting: Vascular Surgery

## 2017-02-16 ENCOUNTER — Ambulatory Visit (INDEPENDENT_AMBULATORY_CARE_PROVIDER_SITE_OTHER)
Admission: RE | Admit: 2017-02-16 | Discharge: 2017-02-16 | Disposition: A | Discharge status: Home / Self Care | Payer: Medicare Other | Source: Ambulatory Visit | Attending: Vascular Surgery | Admitting: Vascular Surgery

## 2017-02-16 DIAGNOSIS — I739 Peripheral vascular disease, unspecified: Secondary | ICD-10-CM | POA: Diagnosis not present

## 2017-02-16 LAB — VAS US LOWER EXTREMITY ARTERIAL DUPLEX
LEFT PERO DIST SYS: 14 cm/s
Left ant tibial distal sys: 33 cm/s
Left popliteal dist sys PSV: -56 cm/s
Left popliteal prox sys PSV: 77 cm/s
Left super femoral dist sys PSV: -61 cm/s
Left super femoral prox sys PSV: -103 cm/s
RIGHT ANT DIST TIBAL SYS PSV: -5 cm/s
RIGHT POST TIB DIST SYS: -19 cm/s
Right peroneal sys PSV: 9 cm/s
Right popliteal dist sys PSV: -46 cm/s
Right popliteal prox sys PSV: 50 cm/s
Right super femoral prox sys PSV: -32 cm/s
left post tibial dist sys: 44 cm/s

## 2017-02-22 ENCOUNTER — Ambulatory Visit (INDEPENDENT_AMBULATORY_CARE_PROVIDER_SITE_OTHER): Payer: Medicare Other | Admitting: Internal Medicine

## 2017-02-22 ENCOUNTER — Encounter: Payer: Self-pay | Admitting: Internal Medicine

## 2017-02-22 DIAGNOSIS — E785 Hyperlipidemia, unspecified: Secondary | ICD-10-CM

## 2017-02-22 DIAGNOSIS — I739 Peripheral vascular disease, unspecified: Secondary | ICD-10-CM | POA: Diagnosis not present

## 2017-02-22 DIAGNOSIS — I10 Essential (primary) hypertension: Secondary | ICD-10-CM

## 2017-02-22 NOTE — Progress Notes (Signed)
Subjective:  Patient ID: Gina [name], female    DOB: 08-Dec-1931  Age: 84 y.o. MRN: [id number]  CC: No chief complaint on file.   HPI Gina [name] presents for R>L LE pain, HTN, dyslipidemia f/u  Outpatient Medications Prior to Visit  Medication Sig Dispense Refill  . acetaminophen (TYLENOL) 500 MG tablet Take 500 mg by mouth every 6 (six) hours as needed. For pain    . allopurinol (ZYLOPRIM) 100 MG tablet Take 1 tablet (100 mg total) by mouth daily. 90 tablet 3  . amLODipine (NORVASC) 5 MG tablet Take 1 tablet (5 mg total) by mouth daily. 90 tablet 3  . Ascorbic Acid (VITAMIN C) 100 MG tablet Take 500 mg by mouth daily.     [name] atorvastatin (LIPITOR) 40 MG tablet Take 1 tablet (40 mg total) by mouth daily. 90 tablet 3  . Calcium Carbonate (CALCIUM 600 PO) Take 600 mg by mouth 2 (two) times daily.     . clopidogrel (PLAVIX) 75 MG tablet Take 1 tablet (75 mg total) by mouth daily. 90 tablet 3  . enalapril (VASOTEC) 10 MG tablet Take 1 tablet (10 mg total) by mouth daily. 90 tablet 3  . Loratadine 10 MG CAPS Take 10 mg by mouth daily.     . MULTIPLE VITAMIN PO Take 1 tablet by mouth daily.     . Multiple Vitamins-Minerals (OCUVITE PRESERVISION) TABS Take 1 tablet by mouth daily.     . pramoxine-hydrocortisone (PROCTOCREAM-HC) 1-1 % rectal cream Place 1 application rectally 2 (two) times daily. 30 g 1  . promethazine-codeine (PHENERGAN WITH CODEINE) 6.25-10 MG/5ML syrup Take 5 mLs by mouth every 4 (four) hours as needed. 300 mL 0  . triamterene-hydrochlorothiazide (MAXZIDE-25) 37.5-25 MG tablet Take 1 tablet by mouth daily. 90 tablet 3   No facility-administered medications prior to visit.     ROS Review of Systems  Constitutional: Positive for fatigue. Negative for activity change, appetite change, chills and unexpected weight change.  HENT: Negative for congestion, mouth sores and sinus pressure.   Eyes: Positive for visual disturbance.  Respiratory: Negative for cough and  chest tightness.   Gastrointestinal: Negative for abdominal pain and nausea.  Genitourinary: Negative for difficulty urinating, frequency and vaginal pain.  Musculoskeletal: Positive for arthralgias and gait problem. Negative for back pain.  Skin: Negative for pallor and rash.  Neurological: Negative for dizziness, tremors, weakness, numbness and headaches.  Psychiatric/Behavioral: Negative for confusion and sleep disturbance.    Objective:  BP 136/72 (BP Location: Left Arm, Patient Position: Sitting, Cuff Size: Large)   Pulse 94   Temp 98.3 F (36.8 C) (Oral)   Ht 5' (1.524 m)   Wt 186 lb (84.4 kg)   SpO2 95%   BMI 36.33 kg/m   BP Readings from Last 3 Encounters:  02/22/17 136/72  12/05/16 (!) 166/74  11/24/16 132/74    Wt Readings from Last 3 Encounters:  02/22/17 186 lb (84.4 kg)  12/05/16 187 lb (84.8 kg)  11/24/16 185 lb (83.9 kg)    Physical Exam  Constitutional: She appears well-developed. No distress.  HENT:  Head: Normocephalic.  Right Ear: External ear normal.  Left Ear: External ear normal.  Nose: Nose normal.  Mouth/Throat: Oropharynx is clear and moist.  Eyes: Conjunctivae are normal. Pupils are equal, round, and reactive to light. Right eye exhibits no discharge. Left eye exhibits no discharge.  Neck: Normal range of motion. Neck supple. No JVD present. No tracheal deviation present. No thyromegaly present.  Cardiovascular: Normal rate, regular rhythm and normal heart sounds.  Pulmonary/Chest: No stridor. No respiratory distress. She has no wheezes.  Abdominal: Soft. Bowel sounds are normal. She exhibits no distension and no mass. There is no tenderness. There is no rebound and no guarding.  Musculoskeletal: She exhibits tenderness. She exhibits no edema.  Lymphadenopathy:    She has no cervical adenopathy.  Neurological: She displays normal reflexes. No cranial nerve deficit. She exhibits normal muscle tone. Coordination normal.  Skin: No rash noted. No  erythema.  Psychiatric: She has a normal mood and affect. Her behavior is normal. Judgment and thought content normal.  obese cane decr LE pulses  Lab Results  Component Value Date   WBC 6.9 02/15/2017   HGB 13.1 02/15/2017   HCT 39.4 02/15/2017   PLT 201.0 02/15/2017   GLUCOSE 95 02/15/2017   CHOL 142 02/15/2017   TRIG 141.0 02/15/2017   HDL 61.40 02/15/2017   LDLDIRECT 69.0 09/09/2014   LDLCALC 53 02/15/2017   ALT 15 02/15/2017   AST 21 02/15/2017   NA 138 02/15/2017   K 4.2 02/15/2017   CL 98 02/15/2017   CREATININE 0.75 02/15/2017   BUN 19 02/15/2017   CO2 30 02/15/2017   TSH 1.44 08/09/2016   INR 0.94 06/07/2010   HGBA1C 5.8 02/18/2016    No results found.  Assessment & Plan:   There are no diagnoses linked to this encounter. I am having Gina [name] maintain her Loratadine, Calcium Carbonate (CALCIUM 600 PO), vitamin C, MULTIPLE VITAMIN PO, OCUVITE PRESERVISION, acetaminophen, pramoxine-hydrocortisone, clopidogrel, atorvastatin, amLODipine, allopurinol, enalapril, triamterene-hydrochlorothiazide, and promethazine-codeine.  No orders of the defined types were placed in this encounter.    Follow-up: No Follow-up on file.  Walker Kehr, MD

## 2017-02-22 NOTE — Assessment & Plan Note (Signed)
Atorvastatin 40 mg

## 2017-02-22 NOTE — Assessment & Plan Note (Signed)
R>L claudication Vasc surg ref Plavix Consider Eliquis low dose

## 2017-02-22 NOTE — Assessment & Plan Note (Signed)
Norvasc, Enalapril, Triamt/HCTZ

## 2017-03-20 ENCOUNTER — Encounter: Payer: Medicare Other | Admitting: Surgery

## 2017-03-24 DIAGNOSIS — M5136 Other intervertebral disc degeneration, lumbar region: Secondary | ICD-10-CM | POA: Diagnosis not present

## 2017-03-24 DIAGNOSIS — M4316 Spondylolisthesis, lumbar region: Secondary | ICD-10-CM | POA: Diagnosis not present

## 2017-03-24 DIAGNOSIS — M48062 Spinal stenosis, lumbar region with neurogenic claudication: Secondary | ICD-10-CM | POA: Diagnosis not present

## 2017-03-24 DIAGNOSIS — M5416 Radiculopathy, lumbar region: Secondary | ICD-10-CM | POA: Diagnosis not present

## 2017-03-24 DIAGNOSIS — I998 Other disorder of circulatory system: Secondary | ICD-10-CM | POA: Diagnosis not present

## 2017-03-24 DIAGNOSIS — M4155 Other secondary scoliosis, thoracolumbar region: Secondary | ICD-10-CM | POA: Diagnosis not present

## 2017-03-24 DIAGNOSIS — M545 Low back pain: Secondary | ICD-10-CM | POA: Diagnosis not present

## 2017-03-24 DIAGNOSIS — M47816 Spondylosis without myelopathy or radiculopathy, lumbar region: Secondary | ICD-10-CM | POA: Diagnosis not present

## 2017-04-04 ENCOUNTER — Encounter: Payer: Self-pay | Admitting: Vascular Surgery

## 2017-04-04 ENCOUNTER — Ambulatory Visit (INDEPENDENT_AMBULATORY_CARE_PROVIDER_SITE_OTHER): Payer: Medicare Other | Admitting: Vascular Surgery

## 2017-04-04 VITALS — BP 157/67 | HR 90 | Temp 97.6°F | Resp 16 | Ht 60.0 in | Wt 186.0 lb

## 2017-04-04 DIAGNOSIS — I739 Peripheral vascular disease, unspecified: Secondary | ICD-10-CM | POA: Diagnosis not present

## 2017-04-04 NOTE — Progress Notes (Signed)
[facility] [place]  Patient name: Gina [name] MRN: [id number] DOB: 1931/11/22 Sex: female  REASON FOR CONSULT: Evaluation of limiting lower extremity claudication bilaterally  HPI: Gina [name] is a 84 y.o. female, who is here for evaluation of bilateral lower occasion symptoms.  She is a very active 84 year old who reports increasingly difficult ability to walk and do her daily activities due to severe claudication in both lower extremities.  She reports this occurs in her thighs and calves and is relieved with rest.  She does have degenerative disc disease as well and does have epidural steroid injection occasionally with relief of this.  She has had no tissue loss.  She underwent recent noninvasive vascular laboratory studies and is here today.  She does not have any arterial rest pain.  She reports that she can walk around her house with no difficulty but does enjoy traveling and has had to stop trips due to pain with walking.  She does have a history of prior TIA no history of cardiac disease.  She is on Plavix due to that.  Past Medical History:  Diagnosis Date  . Allergic rhinitis   . Arthritis   . Colon cancer [facility]) Dec 12  . Diverticulosis of colon (without mention of hemorrhage)   . GI bleed   . Gout   . Hyperlipidemia   . Hyperplastic colon polyp   . Hypertension   . Stroke [facility])    TIA  . Unspecified transient cerebral ischemia sept '11    Family History  Problem Relation Age of Onset  . Colon cancer Father   . Hypertension Father   . Hyperlipidemia Father   . Coronary artery disease Father   . Ulcers Father   . Heart failure Mother   . Coronary artery disease Mother   . Breast cancer Mother   . Hypertension Mother   . Hyperlipidemia Mother   . Asthma Mother   . Diabetes Neg Hx     SOCIAL HISTORY: Social History   Socioeconomic History  . Marital status: Widowed    Spouse name: Not on  file  . Number of children: 3  . Years of education: 39  . Highest education level: Not on file  Social Needs  . Financial resource strain: Not on file  . Food insecurity - worry: Not on file  . Food insecurity - inability: Not on file  . Transportation needs - medical: Not on file  . Transportation needs - non-medical: Not on file  Occupational History  . Occupation: Tree surgeon: RETIRED    Comment: retired  Tobacco Use  . Smoking status: Former Smoker    Packs/day: 1.50    Years: 30.00    Pack years: 45.00    Types: Cigarettes    Last attempt to quit: 07/21/1987    Years since quitting: 29.7  . Smokeless tobacco: Never Used  Substance and Sexual Activity  . Alcohol use: Yes    Alcohol/week: 8.4 oz    Types: 14 Glasses of wine per week    Comment: Wine daily  . Drug use: No  . Sexual activity: Not Currently  Other Topics Concern  . Not on file  Social History Narrative   HSG, Married 1955 widowed 2010. 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 Passed away 2022-07-06  2010 Her son  moved to [place] (July '10). End of life care:  no extra ordinary measures: wants cardiac resusitation and mechanical ventilation short-term if needed. Does not want to be kept in a persistant vegative state or long term artifical life support.     Allergies  Allergen Reactions  . Aspirin Hives  . Penicillins Hives    Current Outpatient Medications  Medication Sig Dispense Refill  . allopurinol (ZYLOPRIM) 100 MG tablet Take 1 tablet (100 mg total) by mouth daily. 90 tablet 3  . amLODipine (NORVASC) 5 MG tablet Take 1 tablet (5 mg total) by mouth daily. 90 tablet 3  . Ascorbic Acid (VITAMIN C) 100 MG tablet Take 500 mg by mouth daily.     [name] atorvastatin (LIPITOR) 40 MG tablet Take 1 tablet (40 mg total) by mouth daily. 90  tablet 3  . Calcium Carbonate (CALCIUM 600 PO) Take 600 mg by mouth 2 (two) times daily.     . clopidogrel (PLAVIX) 75 MG tablet Take 1 tablet (75 mg total) by mouth daily. 90 tablet 3  . enalapril (VASOTEC) 10 MG tablet Take 1 tablet (10 mg total) by mouth daily. 90 tablet 3  . Loratadine 10 MG CAPS Take 10 mg by mouth daily.     . MULTIPLE VITAMIN PO Take 1 tablet by mouth daily.     . Multiple Vitamins-Minerals (OCUVITE PRESERVISION) TABS Take 1 tablet by mouth daily.     [name] triamterene-hydrochlorothiazide (MAXZIDE-25) 37.5-25 MG tablet Take 1 tablet by mouth daily. 90 tablet 3  . acetaminophen (TYLENOL) 500 MG tablet Take 500 mg by mouth every 6 (six) hours as needed. For pain    . pramoxine-hydrocortisone (PROCTOCREAM-HC) 1-1 % rectal cream Place 1 application rectally 2 (two) times daily. (Patient not taking: Reported on 04/04/2017) 30 g 1  . promethazine-codeine (PHENERGAN WITH CODEINE) 6.25-10 MG/5ML syrup Take 5 mLs by mouth every 4 (four) hours as needed. (Patient not taking: Reported on 04/04/2017) 300 mL 0   No current facility-administered medications for this visit.     REVIEW OF SYSTEMS:  [X]  denotes positive finding, [ ]  denotes negative finding Cardiac  Comments:  Chest pain or chest pressure:    Shortness of breath upon exertion:    Short of breath when lying flat:    Irregular heart rhythm:        Vascular    Pain in calf, thigh, or hip brought on by ambulation: x   Pain in feet at night that wakes you up from your sleep:     Blood clot in your veins:    Leg swelling:  x       Pulmonary    Oxygen at home:    Productive cough:     Wheezing:         Neurologic    Sudden weakness in arms or legs:     Sudden numbness in arms or legs:     Sudden onset of difficulty speaking or slurred speech:    Temporary loss of vision in one eye:     Problems with dizziness:         Gastrointestinal    Blood in stool:     Vomited blood:         Genitourinary    Burning when  urinating:     Blood in urine:        Psychiatric    Major depression:         Hematologic  Bleeding problems:    Problems with blood clotting too easily:        Skin    Rashes or ulcers:        Constitutional    Fever or chills:      PHYSICAL EXAM: Vitals:   04/04/17 1233  BP: (!) 157/67  Pulse: 90  Resp: 16  Temp: 97.6 F (36.4 C)  SpO2: 94%  Weight: 186 lb (84.4 kg)  Height: 5' (1.524 m)    GENERAL: The patient is a well-nourished female, in no acute distress. The vital signs are documented above. CARDIOVASCULAR: Carotid arteries without bruits bilaterally.  2+ radial pulses bilaterally.  1+ femoral pulse on the right.  I do not palpate left femoral pulse PULMONARY: There is good air exchange  ABDOMEN: Soft and non-tender  MUSCULOSKELETAL: There are no major deformities or cyanosis. NEUROLOGIC: No focal weakness or paresthesias are detected. SKIN: There are no ulcers or rashes noted. PSYCHIATRIC: The patient has a normal affect.  DATA:  Noninvasive studies from our office from 02/16/2017 reveal monophasic waveforms bilaterally.  Ankle arm index is .55 on the left and 0.17 on the right.  I feel that the right ABI is falsely low.  MEDICAL ISSUES: Severe limiting claudication bilateral lower extremities.  Duplex suggest proximal disease.  I did review a CT scan without contrast from March 2017.  This was for follow-up of colon cancer resection.  This showed extensive aortoiliac calcification.  The aortoiliac segments flow-limiting.  She had subtotal occlusion of her common femoral arteries bilaterally.  Extensive discussion with the patient and her daughter present.  I feel that she does not have any evidence of limb threatening ischemia.  She is severely limited by her claudication symptoms.  I explained the next step in her evaluation would be arteriography.  I do feel that it would be doubtful that she would be a endovascular stent candidate due to the extensive  calcification occlusive disease at her common femoral artery bilaterally.  I did explain that if her disease is limited at this area, she would have excellent result with bilateral common femoral artery.  She wishes to proceed with arteriography for further she has an upcoming steroid injection and would have to be off Plavix around this time.  We will therefore plan arteriography and possible intervention following her epidural steroid injection.  She will call to schedule.   Rosetta Posner, MD FACS [facility] Tel [phone] Pager [phone]

## 2017-04-27 DIAGNOSIS — M48061 Spinal stenosis, lumbar region without neurogenic claudication: Secondary | ICD-10-CM | POA: Diagnosis not present

## 2017-04-27 DIAGNOSIS — M5136 Other intervertebral disc degeneration, lumbar region: Secondary | ICD-10-CM | POA: Diagnosis not present

## 2017-04-27 DIAGNOSIS — M4726 Other spondylosis with radiculopathy, lumbar region: Secondary | ICD-10-CM | POA: Diagnosis not present

## 2017-05-09 DIAGNOSIS — H353134 Nonexudative age-related macular degeneration, bilateral, advanced atrophic with subfoveal involvement: Secondary | ICD-10-CM | POA: Diagnosis not present

## 2017-05-09 DIAGNOSIS — H43813 Vitreous degeneration, bilateral: Secondary | ICD-10-CM | POA: Diagnosis not present

## 2017-05-09 DIAGNOSIS — H35363 Drusen (degenerative) of macula, bilateral: Secondary | ICD-10-CM | POA: Diagnosis not present

## 2017-05-17 ENCOUNTER — Ambulatory Visit: Payer: Medicare Other | Admitting: Internal Medicine

## 2017-05-17 ENCOUNTER — Ambulatory Visit: Payer: Self-pay | Admitting: *Deleted

## 2017-05-17 ENCOUNTER — Ambulatory Visit (INDEPENDENT_AMBULATORY_CARE_PROVIDER_SITE_OTHER): Payer: Medicare Other | Admitting: Family

## 2017-05-17 ENCOUNTER — Telehealth: Payer: Self-pay | Admitting: Internal Medicine

## 2017-05-17 DIAGNOSIS — J209 Acute bronchitis, unspecified: Secondary | ICD-10-CM

## 2017-05-17 DIAGNOSIS — R062 Wheezing: Secondary | ICD-10-CM

## 2017-05-17 MED ORDER — PROMETHAZINE-CODEINE 6.25-10 MG/5ML PO SYRP: 5.0000 mL | ORAL_SOLUTION | Freq: Three times a day (TID) | ORAL | 0 refills | Status: DC | PRN

## 2017-05-17 MED ORDER — DOXYCYCLINE HYCLATE 100 MG PO TABS
100.0000 mg | ORAL_TABLET | Freq: Two times a day (BID) | ORAL | 0 refills | Status: DC
Start: 2017-05-17 — End: 2017-05-25

## 2017-05-17 MED ORDER — ALBUTEROL SULFATE HFA 108 (90 BASE) MCG/ACT IN AERS: 2.0000 | INHALATION_SPRAY | Freq: Four times a day (QID) | RESPIRATORY_TRACT | 1 refills | Status: DC | PRN

## 2017-05-17 MED ORDER — ALBUTEROL SULFATE (2.5 MG/3ML) 0.083% IN NEBU
2.5000 mg | INHALATION_SOLUTION | Freq: Once | RESPIRATORY_TRACT | Status: AC
  Administered 2017-05-17: 2.5 mg via RESPIRATORY_TRACT

## 2017-05-17 NOTE — Telephone Encounter (Signed)
Copied from [place] [id number]. Topic: Quick Communication - See Telephone Encounter >> May 17, 2017  1:25 PM Ivar Drape wrote: CRM for notification. See Telephone encounter for: 05/17/17. Anik a pharmacist w/[facility] [phone] said they are on back order for the promethazine-codeine (PHENERGAN WITH CODEINE) 6.25-10 MG/5ML syrup.  She wants to know if something else would be okay. They have Promethazine/DM, Promethazine/Phenylephrine, and Codene/Guaifenesin on hand.  Please advise.

## 2017-05-17 NOTE — Progress Notes (Signed)
Gina [name] is a 84 y.o. female with the following history as recorded in EpicCare:  Patient Active Problem List   Diagnosis Date Noted  . Abdominal distention 10/04/2016  . Elevated glucose 02/18/2016  . PAD (peripheral artery disease) ([place]) 03/17/2015  . Dysuria 05/17/2014  . Travel advice encounter 03/18/2014  . Osteopenia 03/18/2014  . Well adult exam 09/07/2013  . Routine health maintenance 09/04/2011  . Left otitis media 03/31/2011  . Vertigo 03/31/2011  . Cancer of right colon ([place]) 01/17/2011  . Back pain 10/25/2010  . Unspecified transient cerebral ischemia 11/17/2009  . HIP PAIN, LEFT 07/22/2009  . Diverticulosis 04/24/2008  . COLONIC POLYPS, HYPERPLASTIC, HX OF 04/24/2008  . Dyslipidemia 09/08/2006  . Gout 09/08/2006  . Essential hypertension 09/08/2006  . ALLERGIC RHINITIS 09/08/2006    Current Outpatient Medications  Medication Sig Dispense Refill  . acetaminophen (TYLENOL) 500 MG tablet Take 500 mg by mouth every 6 (six) hours as needed. For pain    . allopurinol (ZYLOPRIM) 100 MG tablet Take 1 tablet (100 mg total) by mouth daily. 90 tablet 3  . amLODipine (NORVASC) 5 MG tablet Take 1 tablet (5 mg total) by mouth daily. 90 tablet 3  . Ascorbic Acid (VITAMIN C) 100 MG tablet Take 500 mg by mouth daily.     [name] atorvastatin (LIPITOR) 40 MG tablet Take 1 tablet (40 mg total) by mouth daily. 90 tablet 3  . Calcium Carbonate (CALCIUM 600 PO) Take 600 mg by mouth 2 (two) times daily.     . clopidogrel (PLAVIX) 75 MG tablet Take 1 tablet (75 mg total) by mouth daily. 90 tablet 3  . enalapril (VASOTEC) 10 MG tablet Take 1 tablet (10 mg total) by mouth daily. 90 tablet 3  . Loratadine 10 MG CAPS Take 10 mg by mouth daily.     . MULTIPLE VITAMIN PO Take 1 tablet by mouth daily.     . Multiple Vitamins-Minerals (OCUVITE PRESERVISION) TABS Take 1 tablet by mouth daily.     . pramoxine-hydrocortisone (PROCTOCREAM-HC) 1-1 % rectal cream Place 1 application rectally 2 (two)  times daily. 30 g 1  . promethazine-codeine (PHENERGAN WITH CODEINE) 6.25-10 MG/5ML syrup Take 5 mLs by mouth every 8 (eight) hours as needed. 60 mL 0  . triamterene-hydrochlorothiazide (MAXZIDE-25) 37.5-25 MG tablet Take 1 tablet by mouth daily. 90 tablet 3  . albuterol (PROVENTIL HFA;VENTOLIN HFA) 108 (90 Base) MCG/ACT inhaler Inhale 2 puffs into the lungs every 6 (six) hours as needed for wheezing or shortness of breath. 1 Inhaler 1  . doxycycline (VIBRA-TABS) 100 MG tablet Take 1 tablet (100 mg total) by mouth 2 (two) times daily. 20 tablet 0   No current facility-administered medications for this visit.     Allergies: Aspirin and Penicillins  Past Medical History:  Diagnosis Date  . Allergic rhinitis   . Arthritis   . Colon cancer [facility]) Dec 12  . Diverticulosis of colon (without mention of hemorrhage)   . GI bleed   . Gout   . Hyperlipidemia   . Hyperplastic colon polyp   . Hypertension   . Stroke [facility])    TIA  . Unspecified transient cerebral ischemia sept '11    Past Surgical History:  Procedure Laterality Date  . BACK SURGERY  April '12   benign cyst spine ([place])  . BREAST LUMPECTOMY     right breast '86  . COLON SURGERY  Dec '12   colectomy for colon cancer ([place])  . TONSILLECTOMY  Family History  Problem Relation Age of Onset  . Colon cancer Father   . Hypertension Father   . Hyperlipidemia Father   . Coronary artery disease Father   . Ulcers Father   . Heart failure Mother   . Coronary artery disease Mother   . Breast cancer Mother   . Hypertension Mother   . Hyperlipidemia Mother   . Asthma Mother   . Diabetes Neg Hx     Social History   Tobacco Use  . Smoking status: Former Smoker    Packs/day: 1.50    Years: 30.00    Pack years: 45.00    Types: Cigarettes    Last attempt to quit: 07/21/1987    Years since quitting: 29.8  . Smokeless tobacco: Never Used  Substance Use Topics  . Alcohol use: Yes    Alcohol/week: 8.4 oz    Types: 14  Glasses of wine per week    Comment: Wine daily    Subjective:  Patient presents with concerns for persisting cough/ congestion;  Symptoms x 4-5 days; +  Prone to bronchitis; + wheezing; denies any chest pain or shortness of breath; no known fever; has used albuterol in the past but does not keep regularly; using OTC Robitussin DM and cough syrup with codeine that she was given in December;   Objective:  Vitals:   05/17/17 1116  BP: 128/60  Pulse: 93  Temp: 98.6 F (37 C)  TempSrc: Oral  SpO2: 96%  Weight: 182 lb 1.3 oz (82.6 kg)  Height: 5' (1.524 m)    General: Well developed, well nourished, in no acute distress  Skin : Warm and dry.  Head: Normocephalic and atraumatic  Eyes: Sclera and conjunctiva clear; pupils round and reactive to light; extraocular movements intact  Ears: External normal; canals clear; tympanic membranes normal  Oropharynx: Pink, supple. No suspicious lesions  Neck: Supple without thyromegaly, adenopathy  Lungs: Respirations unlabored; wheezing in all 4 lobes- good improvement after nebulizer treatment given in office;  CVS exam: normal rate and regular rhythm.  Neurologic: Alert and oriented; speech intact; face symmetrical; moves all extremities well; CNII-XII intact without focal deficit   Assessment:  1. Acute bronchitis, unspecified organism   2. Wheezing     Plan:  Rx for Doxycycline 100 mg bid x 10 days; good response to nebulizer treatment given in office; Rx for albuterol inhaler- 2 puffs q 4-6 hours prn needed; refill on Phenergan Codeine cough syrup; increase fluids, rest and follow-up worse, no better.    No follow-ups on file.  No orders of the defined types were placed in this encounter.   Requested Prescriptions   Signed Prescriptions Disp Refills  . doxycycline (VIBRA-TABS) 100 MG tablet 20 tablet 0    Sig: Take 1 tablet (100 mg total) by mouth 2 (two) times daily.  [name] albuterol (PROVENTIL HFA;VENTOLIN HFA) 108 (90 Base) MCG/ACT  inhaler 1 Inhaler 1    Sig: Inhale 2 puffs into the lungs every 6 (six) hours as needed for wheezing or shortness of breath.  . promethazine-codeine (PHENERGAN WITH CODEINE) 6.25-10 MG/5ML syrup 60 mL 0    Sig: Take 5 mLs by mouth every 8 (eight) hours as needed.

## 2017-05-17 NOTE — Telephone Encounter (Signed)
Pt saw Mickel Baas today and she Rx this

## 2017-05-17 NOTE — Telephone Encounter (Signed)
Patient phoned in requesting an antibiotic for her productive cough she has had for 4 days. Phlegm is thick and yellow today. No fever but does have wheezing on exhale. She tried Robitussin DM that didn't work and tried a left over prescribed Promethazine-codeine cough med given to her by Dr. Raliegh Ip in December.  Reason for Disposition . Wheezing is present  Answer Assessment - Initial Assessment Questions 1. ONSET: "When did the cough begin?"      Today is day 4 2. SEVERITY: "How bad is the cough today?"     Keeps her up at night. Sitting around more during the day because of not sleeping well with this cough. 3. RESPIRATORY DISTRESS: "Describe your breathing."     Wheezing on exhale then a deep cough 4. FEVER: "Do you have a fever?" If so, ask: "What is your temperature, how was it measured, and when did it start?"     no 5. SPUTUM: "Describe the color of your sputum" (clear, white, yellow, green)     Yellowish and thick 6. HEMOPTYSIS: "Are you coughing up any blood?" If so ask: "How much?" (flecks, streaks, tablespoons, etc.)    no 7. CARDIAC HISTORY: "Do you have any history of heart disease?" (e.g., heart attack, congestive heart failure)    no 8. LUNG HISTORY: "Do you have any history of lung disease?"  (e.g., pulmonary embolus, asthma, emphysema)   no 9. PE RISK FACTORS: "Do you have a history of blood clots?" (or: recent major surgery, recent prolonged travel, bedridden )    She is on a blood thinner due to TIA in 2011 10. OTHER SYMPTOMS: "Do you have any other symptoms?" (e.g., runny nose, wheezing, chest pain)      Some nasal congestion.Wheezing on exhale 11. PREGNANCY: "Is there any chance you are pregnant?" "When was your last menstrual period?"    no 12. TRAVEL: "Have you traveled out of the country in the last month?" (e.g., travel history, exposures)    no  Protocols used: [place]

## 2017-05-18 MED ORDER — GUAIFENESIN-CODEINE 100-10 MG/5ML PO SOLN: 5.0000 mL | Freq: Four times a day (QID) | ORAL | 0 refills | Status: DC | PRN

## 2017-05-18 NOTE — Telephone Encounter (Signed)
Please advise.  Thanks, [employer]

## 2017-05-18 NOTE — Telephone Encounter (Signed)
Spoke with patient and she is feeling much better today than yesterday.  She stated [facility] was going to call her once script was ready and that she still had alittle bit of her previous cough syrup left that she was able to take and that her inhaler was helping tremendously.

## 2017-05-18 NOTE — Telephone Encounter (Signed)
Please let her know that I am going to send in Guaifenesin/ Codeine cough syrup as alternative since the one I wrote for her yesterday is on back order; How is she feeling today?

## 2017-05-25 ENCOUNTER — Encounter: Payer: Self-pay | Admitting: Internal Medicine

## 2017-05-25 ENCOUNTER — Ambulatory Visit (INDEPENDENT_AMBULATORY_CARE_PROVIDER_SITE_OTHER): Payer: Medicare Other | Admitting: Internal Medicine

## 2017-05-25 ENCOUNTER — Ambulatory Visit (INDEPENDENT_AMBULATORY_CARE_PROVIDER_SITE_OTHER)
Admission: RE | Admit: 2017-05-25 | Discharge: 2017-05-25 | Disposition: A | Discharge status: Home / Self Care | Payer: Medicare Other | Source: Ambulatory Visit | Attending: Internal Medicine | Admitting: Internal Medicine

## 2017-05-25 DIAGNOSIS — I10 Essential (primary) hypertension: Secondary | ICD-10-CM | POA: Diagnosis not present

## 2017-05-25 DIAGNOSIS — J209 Acute bronchitis, unspecified: Secondary | ICD-10-CM

## 2017-05-25 DIAGNOSIS — G8929 Other chronic pain: Secondary | ICD-10-CM

## 2017-05-25 DIAGNOSIS — M5440 Lumbago with sciatica, unspecified side: Secondary | ICD-10-CM

## 2017-05-25 DIAGNOSIS — R05 Cough: Secondary | ICD-10-CM | POA: Diagnosis not present

## 2017-05-25 MED ORDER — AZITHROMYCIN 250 MG PO TABS: ORAL_TABLET | ORAL | 0 refills | Status: DC

## 2017-05-25 MED ORDER — PROMETHAZINE-CODEINE 6.25-10 MG/5ML PO SYRP: 5.0000 mL | ORAL_SOLUTION | Freq: Four times a day (QID) | ORAL | 0 refills | Status: DC | PRN

## 2017-05-25 NOTE — Progress Notes (Signed)
Subjective:  Patient ID: Gina [name], female    DOB: [birth year]  Age: 84 y.o. MRN: [id number]  CC: No chief complaint on file.   HPI Gina [name] presents for URI sx's - not better after Doxy x 7 d. Got sick x2-3 weeks ago  Outpatient Medications Prior to Visit  Medication Sig Dispense Refill  . acetaminophen (TYLENOL) 500 MG tablet Take 500 mg by mouth every 6 (six) hours as needed. For pain    . albuterol (PROVENTIL HFA;VENTOLIN HFA) 108 (90 Base) MCG/ACT inhaler Inhale 2 puffs into the lungs every 6 (six) hours as needed for wheezing or shortness of breath. 1 Inhaler 1  . allopurinol (ZYLOPRIM) 100 MG tablet Take 1 tablet (100 mg total) by mouth daily. 90 tablet 3  . amLODipine (NORVASC) 5 MG tablet Take 1 tablet (5 mg total) by mouth daily. 90 tablet 3  . Ascorbic Acid (VITAMIN C) 100 MG tablet Take 500 mg by mouth daily.     [name] atorvastatin (LIPITOR) 40 MG tablet Take 1 tablet (40 mg total) by mouth daily. 90 tablet 3  . Calcium Carbonate (CALCIUM 600 PO) Take 600 mg by mouth 2 (two) times daily.     . clopidogrel (PLAVIX) 75 MG tablet Take 1 tablet (75 mg total) by mouth daily. 90 tablet 3  . doxycycline (VIBRA-TABS) 100 MG tablet Take 1 tablet (100 mg total) by mouth 2 (two) times daily. 20 tablet 0  . enalapril (VASOTEC) 10 MG tablet Take 1 tablet (10 mg total) by mouth daily. 90 tablet 3  . guaiFENesin-codeine 100-10 MG/5ML syrup Take 5 mLs by mouth every 6 (six) hours as needed for cough. 60 mL 0  . Loratadine 10 MG CAPS Take 10 mg by mouth daily.     . MULTIPLE VITAMIN PO Take 1 tablet by mouth daily.     . Multiple Vitamins-Minerals (OCUVITE PRESERVISION) TABS Take 1 tablet by mouth daily.     . pramoxine-hydrocortisone (PROCTOCREAM-HC) 1-1 % rectal cream Place 1 application rectally 2 (two) times daily. 30 g 1  . promethazine-codeine (PHENERGAN WITH CODEINE) 6.25-10 MG/5ML syrup Take 5 mLs by mouth every 8 (eight) hours as needed. 60 mL 0  .  triamterene-hydrochlorothiazide (MAXZIDE-25) 37.5-25 MG tablet Take 1 tablet by mouth daily. 90 tablet 3   No facility-administered medications prior to visit.     ROS Review of Systems  Constitutional: Negative for activity change, appetite change, chills, fatigue and unexpected weight change.  HENT: Positive for congestion, postnasal drip and rhinorrhea. Negative for mouth sores and sinus pressure.   Eyes: Negative for visual disturbance.  Respiratory: Positive for cough. Negative for chest tightness and shortness of breath.   Gastrointestinal: Negative for abdominal pain and nausea.  Genitourinary: Negative for difficulty urinating, frequency and vaginal pain.  Musculoskeletal: Positive for back pain and gait problem.  Skin: Negative for pallor and rash.  Neurological: Negative for dizziness, tremors, weakness, numbness and headaches.  Psychiatric/Behavioral: Negative for confusion and sleep disturbance.    Objective:  BP 118/78   Pulse 77   Temp 98.2 F (36.8 C)   Wt 182 lb (82.6 kg)   SpO2 94%   BMI 35.54 kg/m   BP Readings from Last 3 Encounters:  05/25/17 118/78  05/17/17 128/60  04/04/17 (!) 157/67    Wt Readings from Last 3 Encounters:  05/25/17 182 lb (82.6 kg)  05/17/17 182 lb 1.3 oz (82.6 kg)  04/04/17 186 lb (84.4 kg)    Physical Exam  Constitutional: She appears well-developed. No distress.  HENT:  Head: Normocephalic.  Right Ear: External ear normal.  Left Ear: External ear normal.  Nose: Nose normal.  Mouth/Throat: Oropharynx is clear and moist.  eryth throat  Eyes: Pupils are equal, round, and reactive to light. Conjunctivae are normal. Right eye exhibits no discharge. Left eye exhibits no discharge.  Neck: Normal range of motion. Neck supple. No JVD present. No tracheal deviation present. No thyromegaly present.  Cardiovascular: Normal rate, regular rhythm and normal heart sounds.  Pulmonary/Chest: No stridor. No respiratory distress. She has no  wheezes. She has rales.  ?mild rales at the L base  Abdominal: Soft. Bowel sounds are normal. She exhibits no distension and no mass. There is no tenderness. There is no rebound and no guarding.  Musculoskeletal: She exhibits no edema or tenderness.  Lymphadenopathy:    She has no cervical adenopathy.  Neurological: She displays normal reflexes. No cranial nerve deficit. She exhibits normal muscle tone. Coordination normal.  Skin: No rash noted. No erythema.  Psychiatric: She has a normal mood and affect. Her behavior is normal. Judgment and thought content normal.    Lab Results  Component Value Date   WBC 6.9 02/15/2017   HGB 13.1 02/15/2017   HCT 39.4 02/15/2017   PLT 201.0 02/15/2017   GLUCOSE 95 02/15/2017   CHOL 142 02/15/2017   TRIG 141.0 02/15/2017   HDL 61.40 02/15/2017   LDLDIRECT 69.0 09/09/2014   LDLCALC 53 02/15/2017   ALT 15 02/15/2017   AST 21 02/15/2017   NA 138 02/15/2017   K 4.2 02/15/2017   CL 98 02/15/2017   CREATININE 0.75 02/15/2017   BUN 19 02/15/2017   CO2 30 02/15/2017   TSH 1.44 08/09/2016   INR 0.94 06/07/2010   HGBA1C 5.8 02/18/2016    No results found.  Assessment & Plan:   There are no diagnoses linked to this encounter. I am having Gina [name] maintain her Loratadine, Calcium Carbonate (CALCIUM 600 PO), vitamin C, MULTIPLE VITAMIN PO, OCUVITE PRESERVISION, acetaminophen, pramoxine-hydrocortisone, clopidogrel, atorvastatin, amLODipine, allopurinol, enalapril, triamterene-hydrochlorothiazide, doxycycline, albuterol, promethazine-codeine, and guaiFENesin-codeine.  No orders of the defined types were placed in this encounter.    Follow-up: No follow-ups on file.  Walker Kehr, MD

## 2017-05-25 NOTE — Patient Instructions (Signed)
You can use over-the-counter  "cold" medicines  such as  "Afrin" nasal spray for nasal congestion as directed. Use " Delsym" or" Robitussin" cough syrup varietis for cough.  You can use plain "Tylenol" or "Advil" for fever, chills and achyness. Use Halls or Ricola cough drops.     Please, make an appointment if you are not better or if you're worse.  

## 2017-05-25 NOTE — Assessment & Plan Note (Signed)
We discussed Enalapril w/pt: she denies chronic cough

## 2017-05-25 NOTE — Assessment & Plan Note (Signed)
CXR Finish Doxy Prom-cod If worse: Zpac Rx

## 2017-05-25 NOTE — Assessment & Plan Note (Signed)
Chronic sx's 

## 2017-05-26 ENCOUNTER — Ambulatory Visit: Payer: Medicare Other | Admitting: Internal Medicine

## 2017-06-06 ENCOUNTER — Telehealth: Payer: Self-pay | Admitting: *Deleted

## 2017-06-06 MED ORDER — GUAIFENESIN-CODEINE 100-10 MG/5ML PO SYRP: 5.0000 mL | ORAL_SOLUTION | Freq: Three times a day (TID) | ORAL | 0 refills | Status: DC | PRN

## 2017-06-06 NOTE — Telephone Encounter (Signed)
Notified pharmacist w/ MD response spoke w/Meagan. Updated med list../lmb

## 2017-06-06 NOTE — Telephone Encounter (Signed)
Ok Robitussin w/codeine instead Thx

## 2017-06-06 NOTE — Telephone Encounter (Signed)
Copied from [place] [id number]. Topic: General - Other >> Jun 06, 2017 11:30 AM Oneta Rack wrote: [facility] [place], [address] AT [facility] OF [address] [phone] (Phone) [phone] (Fax)  Reason for call:  [facility] states promethazine-codeine ([place]) 6.25-10 MG/5ML syrup is on back order, requesting alternate, pharmacist stated patient mentioned robitussin has worked in the past, please advise >> Jun 06, 2017 11:32 AM Oneta Rack wrote: [facility] [place], [address] AT [facility] OF [place] [phone] (Phone) [phone] (Fax)  Reason for call:  [facility] states promethazine-codeine ([place]) 6.25-10 MG/5ML syrup is on back order, requesting alternate, pharmacist stated patient mentioned robitussin has worked in the past, please advise

## 2017-06-07 ENCOUNTER — Other Ambulatory Visit: Payer: Self-pay | Admitting: *Deleted

## 2017-06-07 NOTE — Progress Notes (Unsigned)
Phone call to patient instructed to be at [facility] admitting department at 7 am on 06/16/17 for procedure. NPO past MN night prior and must have a driver and some at home for discharge. Take the following medications am of procedure with sips of water: amlodopine, enalapril, maxide, and inhaler if needed. Hold all others until after this procedure. Verbalized understanding.

## 2017-06-13 ENCOUNTER — Encounter: Payer: Self-pay | Admitting: Internal Medicine

## 2017-06-14 ENCOUNTER — Encounter: Payer: Self-pay | Admitting: Internal Medicine

## 2017-06-14 ENCOUNTER — Ambulatory Visit (INDEPENDENT_AMBULATORY_CARE_PROVIDER_SITE_OTHER)
Admission: RE | Admit: 2017-06-14 | Discharge: 2017-06-14 | Disposition: A | Discharge status: Home / Self Care | Payer: Medicare Other | Source: Ambulatory Visit | Attending: Internal Medicine | Admitting: Internal Medicine

## 2017-06-14 ENCOUNTER — Ambulatory Visit (INDEPENDENT_AMBULATORY_CARE_PROVIDER_SITE_OTHER): Payer: Medicare Other | Admitting: Internal Medicine

## 2017-06-14 DIAGNOSIS — G8929 Other chronic pain: Secondary | ICD-10-CM | POA: Diagnosis not present

## 2017-06-14 DIAGNOSIS — R0789 Other chest pain: Secondary | ICD-10-CM | POA: Diagnosis not present

## 2017-06-14 DIAGNOSIS — I739 Peripheral vascular disease, unspecified: Secondary | ICD-10-CM | POA: Diagnosis not present

## 2017-06-14 DIAGNOSIS — J209 Acute bronchitis, unspecified: Secondary | ICD-10-CM

## 2017-06-14 DIAGNOSIS — M5440 Lumbago with sciatica, unspecified side: Secondary | ICD-10-CM | POA: Diagnosis not present

## 2017-06-14 DIAGNOSIS — M546 Pain in thoracic spine: Secondary | ICD-10-CM | POA: Diagnosis not present

## 2017-06-14 DIAGNOSIS — Z803 Family history of malignant neoplasm of breast: Secondary | ICD-10-CM | POA: Diagnosis not present

## 2017-06-14 DIAGNOSIS — Z1231 Encounter for screening mammogram for malignant neoplasm of breast: Secondary | ICD-10-CM | POA: Diagnosis not present

## 2017-06-14 LAB — HM MAMMOGRAPHY

## 2017-06-14 MED ORDER — VALACYCLOVIR HCL 1 G PO TABS: 1000.0000 mg | ORAL_TABLET | Freq: Three times a day (TID) | ORAL | 0 refills | Status: DC

## 2017-06-14 NOTE — Assessment & Plan Note (Signed)
Bronchitis sx's disappeared after an Abx. CXR w/?lingula inf on CXR 4/11

## 2017-06-14 NOTE — Assessment & Plan Note (Addendum)
Bronchitis sx's disappeared after an Abx. CXR w/?lingula inf on CXR 4/11 ?MSK pain in the posterior L chest wall between the spine and L shoulder blade. It started after she choked on food and coughed hard last week. No pain at rest. Pain w/ROM. Wearing a bra would aggravate it a little. Pain is 5/10 w/movement...  Call if rash - Vatrex if rash Spine X ray Tylenol prn

## 2017-06-14 NOTE — Progress Notes (Signed)
Subjective:  Patient ID: Gina [name], female    DOB: [birth year]  Age: 84 y.o. MRN: [id number]  CC: No chief complaint on file.   HPI [name] presents for pain in the posterior L chest wall between the spine and L shoulder blade. It started after she choked on food and coughed hard last week. No pain at rest. Pain w/ROM. Wearing a bra would aggravate it a little. Pain is 5/10 w/movement... Bronchitis sx's disappeared after an Abx. CXR w/?lingula inf on CXR 4/11  Outpatient Medications Prior to Visit  Medication Sig Dispense Refill  . acetaminophen (TYLENOL) 500 MG tablet Take 500 mg by mouth every 6 (six) hours as needed. For pain    . albuterol (PROVENTIL HFA;VENTOLIN HFA) 108 (90 Base) MCG/ACT inhaler Inhale 2 puffs into the lungs every 6 (six) hours as needed for wheezing or shortness of breath. 1 Inhaler 1  . allopurinol (ZYLOPRIM) 100 MG tablet Take 1 tablet (100 mg total) by mouth daily. 90 tablet 3  . amLODipine (NORVASC) 5 MG tablet Take 1 tablet (5 mg total) by mouth daily. 90 tablet 3  . Ascorbic Acid (VITAMIN C) 100 MG tablet Take 500 mg by mouth daily.     [name] atorvastatin (LIPITOR) 40 MG tablet Take 1 tablet (40 mg total) by mouth daily. 90 tablet 3  . Calcium Carbonate (CALCIUM 600 PO) Take 600 mg by mouth 2 (two) times daily.     . clopidogrel (PLAVIX) 75 MG tablet Take 1 tablet (75 mg total) by mouth daily. 90 tablet 3  . enalapril (VASOTEC) 10 MG tablet Take 1 tablet (10 mg total) by mouth daily. 90 tablet 3  . guaiFENesin-codeine (ROBITUSSIN AC) 100-10 MG/5ML syrup Take 5 mLs by mouth 3 (three) times daily as needed for cough. 120 mL 0  . guaiFENesin-codeine 100-10 MG/5ML syrup Take 5 mLs by mouth every 6 (six) hours as needed for cough. 60 mL 0  . Loratadine 10 MG CAPS Take 10 mg by mouth daily.     . MULTIPLE VITAMIN PO Take 1 tablet by mouth daily.     . Multiple Vitamins-Minerals (OCUVITE PRESERVISION) TABS Take 1 tablet by mouth daily.     [name]  triamterene-hydrochlorothiazide (MAXZIDE-25) 37.5-25 MG tablet Take 1 tablet by mouth daily. 90 tablet 3   No facility-administered medications prior to visit.     ROS Review of Systems  Constitutional: Negative for activity change, appetite change, chills, fatigue and unexpected weight change.  HENT: Negative for congestion, mouth sores and sinus pressure.   Eyes: Negative for visual disturbance.  Respiratory: Negative for cough, chest tightness, shortness of breath and wheezing.   Cardiovascular: Positive for chest pain. Negative for palpitations.  Gastrointestinal: Negative for abdominal pain and nausea.  Genitourinary: Negative for difficulty urinating, frequency and vaginal pain.  Musculoskeletal: Positive for back pain. Negative for gait problem.  Skin: Negative for pallor and rash.  Neurological: Negative for dizziness, tremors, weakness, numbness and headaches.  Psychiatric/Behavioral: Negative for confusion and sleep disturbance.    Objective:  BP 132/68 (BP Location: Left Arm, Patient Position: Sitting, Cuff Size: Large)   Pulse 84   Temp 98.8 F (37.1 C) (Oral)   Ht 5' (1.524 m)   Wt 182 lb (82.6 kg)   SpO2 97%   BMI 35.54 kg/m   BP Readings from Last 3 Encounters:  06/14/17 132/68  05/25/17 118/78  05/17/17 128/60    Wt Readings from Last 3 Encounters:  06/14/17 182 lb (82.6  kg)  05/25/17 182 lb (82.6 kg)  05/17/17 182 lb 1.3 oz (82.6 kg)    Physical Exam  Constitutional: She appears well-developed. No distress.  HENT:  Head: Normocephalic.  Right Ear: External ear normal.  Left Ear: External ear normal.  Nose: Nose normal.  Mouth/Throat: Oropharynx is clear and moist.  Eyes: Pupils are equal, round, and reactive to light. Conjunctivae are normal. Right eye exhibits no discharge. Left eye exhibits no discharge.  Neck: Normal range of motion. Neck supple. No JVD present. No tracheal deviation present. No thyromegaly present.  Cardiovascular: Normal  rate, regular rhythm and normal heart sounds.  Pulmonary/Chest: No stridor. No respiratory distress. She has no wheezes.  Abdominal: Soft. Bowel sounds are normal. She exhibits no distension and no mass. There is no tenderness. There is no rebound and no guarding.  Musculoskeletal: She exhibits no edema or tenderness.  Lymphadenopathy:    She has no cervical adenopathy.  Neurological: She displays normal reflexes. No cranial nerve deficit. She exhibits normal muscle tone. Coordination normal.  Skin: No rash noted. No erythema.  Psychiatric: She has a normal mood and affect. Her behavior is normal. Judgment and thought content normal.   No rash Chest/spine/scapula NT  Lab Results  Component Value Date   WBC 6.9 02/15/2017   HGB 13.1 02/15/2017   HCT 39.4 02/15/2017   PLT 201.0 02/15/2017   GLUCOSE 95 02/15/2017   CHOL 142 02/15/2017   TRIG 141.0 02/15/2017   HDL 61.40 02/15/2017   LDLDIRECT 69.0 09/09/2014   LDLCALC 53 02/15/2017   ALT 15 02/15/2017   AST 21 02/15/2017   NA 138 02/15/2017   K 4.2 02/15/2017   CL 98 02/15/2017   CREATININE 0.75 02/15/2017   BUN 19 02/15/2017   CO2 30 02/15/2017   TSH 1.44 08/09/2016   INR 0.94 06/07/2010   HGBA1C 5.8 02/18/2016    Dg Chest 2 View  Result Date: 05/25/2017 CLINICAL DATA:  Cough EXAM: CHEST - 2 VIEW COMPARISON:  CT chest 05/06/2015 FINDINGS: Patchy opacity at the lingula. No pleural effusion. Normal heart size. Aortic atherosclerosis. No pneumothorax. Degenerative changes of the spine. IMPRESSION: Patchy lingular opacity may reflect atelectasis or minimal infiltrate. Short interval radiographic follow-up suggested. Electronically Signed   By: Donavan Foil M.D.   On: 05/25/2017 16:02    Assessment & Plan:   There are no diagnoses linked to this encounter. I am having [name] maintain her Loratadine, Calcium Carbonate (CALCIUM 600 PO), vitamin C, MULTIPLE VITAMIN PO, OCUVITE PRESERVISION, acetaminophen, clopidogrel,  atorvastatin, amLODipine, allopurinol, enalapril, triamterene-hydrochlorothiazide, albuterol, guaiFENesin-codeine, and guaiFENesin-codeine.  No orders of the defined types were placed in this encounter.    Follow-up: No follow-ups on file.  Walker Kehr, MD

## 2017-06-14 NOTE — Assessment & Plan Note (Signed)
Chronic pain 

## 2017-06-14 NOTE — Assessment & Plan Note (Signed)
Angio on Fri is pending

## 2017-06-15 NOTE — Telephone Encounter (Signed)
Patient would like call about X Ray results.

## 2017-06-15 NOTE — Telephone Encounter (Signed)
Copied from [place] [id number]. Topic: Quick Communication - See Telephone Encounter >> Jun 15, 2017  9:27 AM Percell Belt A wrote: CRM for notification. See Telephone encounter for: 06/15/17.  Pt called in and would like someone to call her about her xrays as soon as they are resulted.    Best number [phone]

## 2017-06-16 ENCOUNTER — Ambulatory Visit (HOSPITAL_COMMUNITY)
Admission: RE | Admit: 2017-06-16 | Discharge: 2017-06-16 | Disposition: A | Discharge status: Home / Self Care | Payer: Medicare Other | Source: Ambulatory Visit | Attending: Vascular Surgery | Admitting: Vascular Surgery

## 2017-06-16 ENCOUNTER — Ambulatory Visit (HOSPITAL_COMMUNITY)
Admission: RE | Disposition: A | Discharge status: Home / Self Care | Payer: Self-pay | Source: Ambulatory Visit | Attending: Vascular Surgery

## 2017-06-16 DIAGNOSIS — Z8249 Family history of ischemic heart disease and other diseases of the circulatory system: Secondary | ICD-10-CM | POA: Insufficient documentation

## 2017-06-16 DIAGNOSIS — Z803 Family history of malignant neoplasm of breast: Secondary | ICD-10-CM | POA: Insufficient documentation

## 2017-06-16 DIAGNOSIS — E785 Hyperlipidemia, unspecified: Secondary | ICD-10-CM | POA: Diagnosis not present

## 2017-06-16 DIAGNOSIS — M109 Gout, unspecified: Secondary | ICD-10-CM | POA: Diagnosis not present

## 2017-06-16 DIAGNOSIS — Z8673 Personal history of transient ischemic attack (TIA), and cerebral infarction without residual deficits: Secondary | ICD-10-CM | POA: Insufficient documentation

## 2017-06-16 DIAGNOSIS — Z820 Family history of epilepsy and other diseases of the nervous system: Secondary | ICD-10-CM | POA: Insufficient documentation

## 2017-06-16 DIAGNOSIS — Z888 Allergy status to other drugs, medicaments and biological substances status: Secondary | ICD-10-CM | POA: Diagnosis not present

## 2017-06-16 DIAGNOSIS — I70213 Atherosclerosis of native arteries of extremities with intermittent claudication, bilateral legs: Secondary | ICD-10-CM | POA: Diagnosis not present

## 2017-06-16 DIAGNOSIS — Z880 Allergy status to penicillin: Secondary | ICD-10-CM | POA: Diagnosis not present

## 2017-06-16 DIAGNOSIS — M1990 Unspecified osteoarthritis, unspecified site: Secondary | ICD-10-CM | POA: Diagnosis not present

## 2017-06-16 DIAGNOSIS — Z85038 Personal history of other malignant neoplasm of large intestine: Secondary | ICD-10-CM | POA: Insufficient documentation

## 2017-06-16 DIAGNOSIS — I10 Essential (primary) hypertension: Secondary | ICD-10-CM | POA: Diagnosis not present

## 2017-06-16 DIAGNOSIS — Z800 Family history of malignant neoplasm of digestive organs: Secondary | ICD-10-CM | POA: Insufficient documentation

## 2017-06-16 DIAGNOSIS — Z87891 Personal history of nicotine dependence: Secondary | ICD-10-CM | POA: Insufficient documentation

## 2017-06-16 DIAGNOSIS — Z886 Allergy status to analgesic agent status: Secondary | ICD-10-CM | POA: Insufficient documentation

## 2017-06-16 DIAGNOSIS — Z8371 Family history of colonic polyps: Secondary | ICD-10-CM | POA: Insufficient documentation

## 2017-06-16 DIAGNOSIS — Z86010 Personal history of colonic polyps: Secondary | ICD-10-CM | POA: Insufficient documentation

## 2017-06-16 HISTORY — PX: PERIPHERAL VASCULAR INTERVENTION: CATH118257

## 2017-06-16 HISTORY — PX: ABDOMINAL AORTOGRAM: CATH118222

## 2017-06-16 HISTORY — PX: LOWER EXTREMITY ANGIOGRAPHY: CATH118251

## 2017-06-16 LAB — POCT I-STAT, CHEM 8
BUN: 18 mg/dL (ref 6–20)
Calcium, Ion: 1.28 mmol/L (ref 1.15–1.40)
Chloride: 94 mmol/L — ABNORMAL LOW (ref 101–111)
Creatinine, Ser: 0.8 mg/dL (ref 0.44–1.00)
Glucose, Bld: 101 mg/dL — ABNORMAL HIGH (ref 65–99)
HCT: 39 % (ref 36.0–46.0)
Hemoglobin: 13.3 g/dL (ref 12.0–15.0)
Potassium: 3.7 mmol/L (ref 3.5–5.1)
Sodium: 134 mmol/L — ABNORMAL LOW (ref 135–145)
TCO2: 30 mmol/L (ref 22–32)

## 2017-06-16 LAB — POCT ACTIVATED CLOTTING TIME
Activated Clotting Time: 230 seconds
Activated Clotting Time: 191 seconds
Activated Clotting Time: 164 seconds
Activated Clotting Time: 186 seconds

## 2017-06-16 SURGERY — LOWER EXTREMITY ANGIOGRAPHY
Anesthesia: LOCAL

## 2017-06-16 MED ORDER — MIDAZOLAM HCL 2 MG/2ML IJ SOLN
INTRAMUSCULAR | Status: DC | PRN
  Administered 2017-06-16: 1 mg via INTRAVENOUS

## 2017-06-16 MED ORDER — HEPARIN SODIUM (PORCINE) 1000 UNIT/ML IJ SOLN
INTRAMUSCULAR | Status: DC | PRN
  Administered 2017-06-16: 7000 [IU] via INTRAVENOUS

## 2017-06-16 MED ORDER — LIDOCAINE HCL (PF) 1 % IJ SOLN
INTRAMUSCULAR | Status: AC
  Filled 2017-06-16: qty 30

## 2017-06-16 MED ORDER — LIDOCAINE HCL (PF) 1 % IJ SOLN
INTRAMUSCULAR | Status: DC | PRN
  Administered 2017-06-16: 10 mL

## 2017-06-16 MED ORDER — ACETAMINOPHEN 325 MG PO TABS: 650.0000 mg | ORAL_TABLET | ORAL | Status: DC | PRN

## 2017-06-16 MED ORDER — HYDRALAZINE HCL 20 MG/ML IJ SOLN: 5.0000 mg | INTRAMUSCULAR | Status: DC | PRN

## 2017-06-16 MED ORDER — SODIUM CHLORIDE 0.9 % IV SOLN
INTRAVENOUS | Status: DC
  Administered 2017-06-16: 08:00:00 via INTRAVENOUS

## 2017-06-16 MED ORDER — HEPARIN (PORCINE) IN NACL 1000-0.9 UT/500ML-% IV SOLN
INTRAVENOUS | Status: AC
  Filled 2017-06-16: qty 1000

## 2017-06-16 MED ORDER — SODIUM CHLORIDE 0.9 % IV SOLN: 250.0000 mL | INTRAVENOUS | Status: DC | PRN

## 2017-06-16 MED ORDER — SODIUM CHLORIDE 0.9 % WEIGHT BASED INFUSION: 1.0000 mL/kg/h | INTRAVENOUS | Status: DC

## 2017-06-16 MED ORDER — HEPARIN SODIUM (PORCINE) 1000 UNIT/ML IJ SOLN
INTRAMUSCULAR | Status: AC
  Filled 2017-06-16: qty 1

## 2017-06-16 MED ORDER — SODIUM CHLORIDE 0.9% FLUSH: 3.0000 mL | Freq: Two times a day (BID) | INTRAVENOUS | Status: DC

## 2017-06-16 MED ORDER — IODIXANOL 320 MG/ML IV SOLN
INTRAVENOUS | Status: DC | PRN
  Administered 2017-06-16: 165 mL via INTRA_ARTERIAL

## 2017-06-16 MED ORDER — SODIUM CHLORIDE 0.9% FLUSH: 3.0000 mL | INTRAVENOUS | Status: DC | PRN

## 2017-06-16 MED ORDER — HEPARIN (PORCINE) IN NACL 2-0.9 UNITS/ML
INTRAMUSCULAR | Status: AC | PRN
  Administered 2017-06-16: 1000 mL via INTRA_ARTERIAL

## 2017-06-16 MED ORDER — ONDANSETRON HCL 4 MG/2ML IJ SOLN: 4.0000 mg | Freq: Four times a day (QID) | INTRAMUSCULAR | Status: DC | PRN

## 2017-06-16 MED ORDER — LABETALOL HCL 5 MG/ML IV SOLN: 10.0000 mg | INTRAVENOUS | Status: DC | PRN

## 2017-06-16 MED ORDER — FENTANYL CITRATE (PF) 100 MCG/2ML IJ SOLN
INTRAMUSCULAR | Status: AC
  Filled 2017-06-16: qty 2

## 2017-06-16 MED ORDER — MIDAZOLAM HCL 2 MG/2ML IJ SOLN
INTRAMUSCULAR | Status: AC
  Filled 2017-06-16: qty 2

## 2017-06-16 MED ORDER — FENTANYL CITRATE (PF) 100 MCG/2ML IJ SOLN
INTRAMUSCULAR | Status: DC | PRN
  Administered 2017-06-16: 50 ug via INTRAVENOUS

## 2017-06-16 SURGICAL SUPPLY — 16 items
BALLN MUSTANG 8X20X75 (BALLOONS) ×3
BALLOON MUSTANG 8X20X75 (BALLOONS) ×2 IMPLANT
CATH ANGIO 5F PIGTAIL 65CM (CATHETERS) ×3 IMPLANT
COVER PRB 48X5XTLSCP FOLD TPE (BAG) ×2 IMPLANT
COVER PROBE 5X48 (BAG) ×1
KIT ENCORE 26 ADVANTAGE (KITS) ×3 IMPLANT
KIT MICROPUNCTURE NIT STIFF (SHEATH) ×3 IMPLANT
KIT PV (KITS) ×3 IMPLANT
SHEATH BRITE TIP 6FR 35CM (SHEATH) ×3 IMPLANT
SHEATH PINNACLE 5F 10CM (SHEATH) ×3 IMPLANT
STENT EXPRESS LD 7X27X75 (Permanent Stent) ×3 IMPLANT
SYR MEDRAD MARK V 150ML (SYRINGE) ×3 IMPLANT
TRANSDUCER W/STOPCOCK (MISCELLANEOUS) ×3 IMPLANT
TRAY PV CATH (CUSTOM PROCEDURE TRAY) ×3 IMPLANT
WIRE HITORQ VERSACORE ST 145CM (WIRE) ×3 IMPLANT
WIRE TORQFLEX AUST .018X40CM (WIRE) ×6 IMPLANT

## 2017-06-16 NOTE — H&P (Signed)
Patient name: Gina [name] MRN: [id number] DOB: 1931/04/11 Sex: female   [place] VISIT:    Bilateral lower extremity claudication.  HPI:   Gina [name] is a pleasant 84 y.o. female, who was seen by Dr. Sherren Mocha Early in the office on 04/04/2017 to evaluate bilateral lower extremity claudication.  The patient is 84 year old but is very active and likes to travel.  She experiences pain in her calves and thighs bilaterally which is brought on by ambulation and relieved with rest.  In addition she does have some degenerative disc disease of her back.  She had noninvasive studies which suggested aortoiliac occlusive disease.  She wished to pursue further work-up.  Dr. Donnetta Hutching had reviewed a previous CT scan which showed significant calcific disease of the aorta and iliac arteries.  She also had subtotal occlusions of her common femoral arteries bilaterally.  She presents for arteriography.  She is on Plavix.   There have been no changes in her medical history since she was seen in February.  Past Medical History:  Diagnosis Date  . Allergic rhinitis   . Arthritis   . Colon cancer [facility]) Dec 12  . Diverticulosis of colon (without mention of hemorrhage)   . GI bleed   . Gout   . Hyperlipidemia   . Hyperplastic colon polyp   . Hypertension   . Stroke [facility])    TIA  . Unspecified transient cerebral ischemia sept '11    Family History  Problem Relation Age of Onset  . Colon cancer Father   . Hypertension Father   . Hyperlipidemia Father   . Coronary artery disease Father   . Ulcers Father   . Heart failure Mother   . Coronary artery disease Mother   . Breast cancer Mother   . Hypertension Mother   . Hyperlipidemia Mother   . Asthma Mother   . Diabetes Neg Hx     SOCIAL HISTORY: Social History   Socioeconomic History  . Marital status: Widowed    Spouse name: Not on file  . Number of children: 3  . Years of education: 72  . Highest education level: Not on  file  Occupational History  . 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 Financial resource strain: Not on file  . Food insecurity:    Worry: Not on file    Inability: Not on file  . Transportation needs:    Medical: Not on file    Non-medical: Not on file  Tobacco Use  . Smoking status: Former Smoker    Packs/day: 1.50    Years: 30.00    Pack years: 45.00    Types: Cigarettes    Last attempt to quit: 07/21/1987    Years since quitting: 29.9  . Smokeless tobacco: Never Used  Substance and Sexual Activity  . Alcohol use: Yes    Alcohol/week: 8.4 oz    Types: 14 Glasses of wine per week    Comment: Wine daily  . Drug use: No  . Sexual activity: Not Currently  Lifestyle  . Physical activity:    Days per week: Not on file    Minutes per session: Not on file  . Stress: Not on file  Relationships  . Social connections:    Talks on phone: Not on file    Gets together: Not on file    Attends religious service: Not on file    Active member  of club or organization: Not on file    Attends meetings of clubs or organizations: Not on file    Relationship status: Not on file  . Intimate partner violence:    Fear of current or ex partner: Not on file    Emotionally abused: Not on file    Physically abused: Not on file    Forced sexual activity: Not on file  Other Topics Concern  . Not on file  Social History Narrative   HSG, Married 1955 widowed 2010. 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 Passed away Jul 05, 2008 Her son  moved to [place] (July '10). End of life care:  no extra ordinary measures: wants cardiac resusitation and mechanical ventilation short-term if needed. Does not want to be kept in a persistant vegative state or long term artifical life support.     Allergies    Allergen Reactions  . Aspirin Hives  . Penicillins Hives    Has patient had a PCN reaction causing immediate rash, facial/tongue/throat swelling, SOB or lightheadedness with hypotension: yes Has patient had a PCN reaction causing severe rash involving mucus membranes or skin necrosis: no Has patient had a PCN reaction that required hospitalization: no Has patient had a PCN reaction occurring within the last 10 years: no If all of the above answers are "NO", then may proceed with Cephalosporin use.   . Adhesive [Tape] Other (See Comments)    "pulls skin off"    Current Facility-Administered Medications  Medication Dose Route Frequency Provider Last Rate Last Dose  . 0.9 %  sodium chloride infusion   Intravenous Continuous Angelia Mould, MD 100 mL/hr at 06/16/17 0757      REVIEW OF SYSTEMS:  [X]  denotes positive finding, [ ]  denotes negative finding Cardiac  Comments:  Chest pain or chest pressure:    Shortness of breath upon exertion:    Short of breath when lying flat:    Irregular heart rhythm:        Vascular    Pain in calf, thigh, or hip brought on by ambulation: x   Pain in feet at night that wakes you up from your sleep:     Blood clot in your veins:    Leg swelling:         Pulmonary    Oxygen at home:    Productive cough:     Wheezing:         Neurologic    Sudden weakness in arms or legs:     Sudden numbness in arms or legs:     Sudden onset of difficulty speaking or slurred speech:    Temporary loss of vision in one eye:     Problems with dizziness:         Gastrointestinal    Blood in stool:     Vomited blood:         Genitourinary    Burning when urinating:     Blood in urine:        Psychiatric    Major depression:         Hematologic    Bleeding problems:    Problems with blood clotting too easily:        Skin    Rashes or ulcers:        Constitutional    Fever or chills:  PHYSICAL EXAM:   Vitals:   06/16/17 0713  BP:  (!) 173/58  Pulse: 82  Temp: 97.9 F (36.6 C)  TempSrc: Oral  SpO2: 96%  Weight: 178 lb (80.7 kg)  Height: 5' (1.524 m)   GENERAL: The patient is a well-nourished female, in no acute distress. The vital signs are documented above. CARDIAC: There is a regular rate and rhythm.  VASCULAR: I do not detect carotid bruits. On the right side I cannot palpate a femoral pulse or pedal pulses. On the left side I do palpate a femoral pulse.  I cannot palpate pedal pulses. PULMONARY: There is good air exchange bilaterally without wheezing or rales. ABDOMEN: Soft and non-tender with normal pitched bowel sounds.  MUSCULOSKELETAL: There are no major deformities or cyanosis. NEUROLOGIC: No focal weakness or paresthesias are detected. SKIN: There are no ulcers or rashes noted. PSYCHIATRIC: The patient has a normal affect.  DATA:    No new data.  MEDICAL ISSUES:   BILATERAL LOWER EXTREMITY CLAUDICATION: CT scan showed evidence of aortoiliac occlusive disease.  She has claudication which is limiting her lifestyle.  She presents for arteriography.  As per Dr. Marrion Coy note, given the extensive aortoiliac calcification she may not be a candidate for an endovascular approach.  I have discussed the indications for the procedure with the patient and the potential complications, including but not limited to, bleeding, arterial thrombosis, or arterial injury.  All of her questions were answered and she is agreeable to proceed.  Deitra Mayo [facility] [phone]

## 2017-06-16 NOTE — Progress Notes (Signed)
6 fr sheath was removed from the patient's LFA using manual pressure for 30 minutes.  Groin site looks unremarkable with no oozing or hematoma at site.  Tegaderm and gauze was applied to the area and patient tolerated well.  Post care instructions were given to patient prior to leaving the holding area.  Vitals signs: BP 136/49, sats 97%

## 2017-06-16 NOTE — Op Note (Signed)
PATIENT: Gina [name]      MRN: [id number] DOB: 09/05/1931    DATE OF PROCEDURE: 06/16/2017  INDICATIONS:    TANIESHA GLANZ is a 84 y.o. female who presented with lifestyle limiting claudication of both lower extremities.  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  PROCEDURE:    1.  Ultrasound-guided access to the left common femoral artery 2.  Conscious sedation 3.  Aortogram with bilateral iliac arteriogram and bilateral lower extremity runoff 4.  Angioplasty and stenting of the left common iliac artery (7 x 27 mm balloon expandable stent, postdilatation with 8 mm x 2 cm Mustang balloon)  SURGEON: Judeth Cornfield. Scot Dock, MD, FACS  ANESTHESIA: Local with sedation  EBL: Minimal  TECHNIQUE: The patient was brought to the peripheral vascular lab and was sedated. The period of conscious sedation was 60 minutes.  During that time period, I was present face-to-face 100% of the time.  The patient was administered 1 mg of Versed and 50 mcg of fentanyl. The patient's heart rate, blood pressure, and oxygen saturation were monitored by the nurse continuously during the procedure.  Both groins were prepped and draped in the usual sterile fashion.  Under ultrasound guidance, after the skin was anesthetized, I cannulated the left there was however significant calcific disease and plaque common femoral artery with a micropuncture needle and a micropuncture sheath was introduced over a wire.  This was exchanged for a 5 [place] sheath over a Bentson wire.  By ultrasound the femoral artery was patent.  In the common femoral artery and I was able to cannulate above this area with a micropuncture needle.  A real-time image was obtained and placed in the chart.   The Bentson wire was advanced into the infrarenal aorta and unable aortogram was obtained using a pigtail catheter.  The pigtail catheter was then positioned above the  aortic bifurcation and oblique iliac projections were obtained.  Next bilateral lower extremity runoff films were obtained.  There was an 80% calcific plaque in the left common iliac artery which I elected to address with angioplasty and stenting.  I exchange the 5 [place] sheath for a bright tip 6 French sheath which was positioned below the area of concern.  The patient was heparinized and ACT monitored throughout the case.  Measurements were made and I selected a 7 mm x 27 mm balloon expandable stent.  This was positioned across the stenosis and inflated to rated burst pressure for 1 minute.  Follow-up film showed some mild residual stenosis so I elected to post dilate this with an 8 mm x 2 cm balloon.  2 inflations were made to nominal pressure within the stent.  Completion film showed a good result with minimal residual stenosis.  There was some calcific plaque right at the aortic bifurcation that did not appear to be flow-limiting.  There was also some mild stenosis in the left external iliac artery which likewise did not appear to be flow-limiting.  However I elected to measure pressure gradients across this area.  The 6 French sheath was advanced into the distal aorta and the dilator were then removed.  The sheath was then retracted and no significant pressure gradient was noted at rest in the proximal left common iliac artery or in the distal external iliac artery.  A total of 167 cc of Visipaque contrast was used.  FINDINGS:   1.  There are single renal  arteries bilaterally with no significant renal artery stenosis identified.  The infrarenal aorta is widely patent. 2.  On the left side, the patient had an 80% left common iliac artery stenosis which was successfully ballooned and stented as described above with minimal residual stenosis of less than 15%.  There was some mild disease in the proximal left common iliac artery and left external iliac artery with no pressure gradient measured across  this area.  The common femoral artery had severe diffuse disease which extended into the deep femoral artery.  The superficial femoral artery is patent.  There was a focal plaque in the mid thigh.  Below that the popliteal, anterior tibial, posterior tibial, peroneal arteries were patent. 3.  On the right side there was some plaque in the very proximal right external iliac artery.  The common iliac artery was patent.  There is mild disease in the mid segment of the right external iliac artery.  The common femoral artery was occluded with reconstitution of the proximal superficial femoral artery and deep femoral artery.  The superficial femoral artery, popliteal, anterior tibial, posterior tibial, peroneal arteries are patent.  CLINICAL NOTE: This patient could potentially benefit from bilateral common femoral artery endarterectomies with vein patch angioplasty.  I will arrange a follow-up visit with Dr. early to discuss this option.  PRE: 80% left common iliac artery stenosis POST: Less than 15% residual stenosis STENT: 7 mm x 27 mm balloon expandable stent  Deitra Mayo, MD, FACS [facility] [place]  DATE OF DICTATION:   06/16/2017

## 2017-06-16 NOTE — Discharge Instructions (Signed)

## 2017-06-19 ENCOUNTER — Encounter (HOSPITAL_COMMUNITY): Payer: Self-pay | Admitting: Vascular Surgery

## 2017-06-20 MED FILL — Heparin Sod (Porcine)-NaCl IV Soln 1000 Unit/500ML-0.9%: INTRAVENOUS | Qty: 1000 | Status: AC

## 2017-06-21 ENCOUNTER — Telehealth: Payer: Self-pay | Admitting: Vascular Surgery

## 2017-06-21 NOTE — Telephone Encounter (Signed)
-----   Message from Mena Goes, RN sent at 06/16/2017  2:13 PM EDT ----- Regarding: appt with Early in 2-3 weeks to discuss surgery   ----- Message ----- From: Angelia Mould, MD Sent: 06/16/2017  10:29 AM To: [facility] Charge Pool Subject: charge and f/u                                  PROCEDURE:   1.  Ultrasound-guided access to the left common femoral artery 2.  Conscious sedation 3.  Aortogram with bilateral iliac arteriogram and bilateral lower extremity runoff 4.  Angioplasty and stenting of the left common iliac artery (7 x 27 mm balloon expandable stent, postdilatation with 8 mm x 2 cm Mustang balloon)  SURGEON: Judeth Cornfield. Scot Dock, MD, FACS  ANESTHESIA: Local with sedation  This patient will need a follow-up visit with Dr. early in 2 to 3 weeks to discuss possible bilateral common femoral artery endarterectomies. CD

## 2017-06-21 NOTE — Telephone Encounter (Signed)
Sched appt 07/04/17 at 2:15. Spoke to pt to inform them of appt.

## 2017-07-04 ENCOUNTER — Encounter: Payer: Self-pay | Admitting: *Deleted

## 2017-07-04 ENCOUNTER — Ambulatory Visit (INDEPENDENT_AMBULATORY_CARE_PROVIDER_SITE_OTHER): Payer: Medicare Other | Admitting: Vascular Surgery

## 2017-07-04 ENCOUNTER — Encounter: Payer: Self-pay | Admitting: Vascular Surgery

## 2017-07-04 VITALS — BP 167/76 | HR 87 | Temp 98.4°F | Resp 16 | Ht 60.0 in | Wt 185.0 lb

## 2017-07-04 DIAGNOSIS — I739 Peripheral vascular disease, unspecified: Secondary | ICD-10-CM

## 2017-07-04 NOTE — H&P (View-Only) (Signed)
[facility] [place]  Patient name: LOUNA ROTHGEB MRN: [id number] DOB: 22-Dec-1931 Sex: female  REASON FOR VISIT: Discuss recent aortogram with runoff from 06/16/2017                  HPI: GIULIETTA PROKOP is a 84 y.o. female here today for continued discussion of her severe limiting lower extremity claudication.  She is here today with her son.  She underwent outpatient arteriogram which revealed moderate stenosis of her proximal left common iliac artery which was corrected with angioplasty and stenting by Dr. Scot Dock.  He had complete occlusion of her common femoral artery on the right and subtotal occlusion of her common femoral artery on the left.  Had patency of her superficial femoral, popliteal and tibial vessels bilaterally.  He has severe claudication symptoms bilaterally right greater than left and Damarrion Mimbs rest pain on the right  Current Outpatient Medications  Medication Sig Dispense Refill  . acetaminophen (TYLENOL) 500 MG tablet Take 500 mg by mouth every 6 (six) hours as needed. For pain    . allopurinol (ZYLOPRIM) 100 MG tablet Take 1 tablet (100 mg total) by mouth daily. 90 tablet 3  . amLODipine (NORVASC) 5 MG tablet Take 1 tablet (5 mg total) by mouth daily. 90 tablet 3  . Ascorbic Acid (VITAMIN C) 100 MG tablet Take 500 mg by mouth daily.     [name] atorvastatin (LIPITOR) 40 MG tablet Take 1 tablet (40 mg total) by mouth daily. 90 tablet 3  . Calcium Carbonate (CALCIUM 600 PO) Take 600 mg by mouth 2 (two) times daily.     . clopidogrel (PLAVIX) 75 MG tablet Take 1 tablet (75 mg total) by mouth daily. 90 tablet 3  . enalapril (VASOTEC) 10 MG tablet Take 1 tablet (10 mg total) by mouth daily. 90 tablet 3  . Loratadine 10 MG CAPS Take 10 mg by mouth daily.     . MULTIPLE VITAMIN PO Take 1 tablet by mouth daily.     . Multiple Vitamins-Minerals (OCUVITE PRESERVISION) TABS Take 1 tablet by mouth daily.     [name]  triamterene-hydrochlorothiazide (MAXZIDE-25) 37.5-25 MG tablet Take 1 tablet by mouth daily. 90 tablet 3  . albuterol (PROVENTIL HFA;VENTOLIN HFA) 108 (90 Base) MCG/ACT inhaler Inhale 2 puffs into the lungs every 6 (six) hours as needed for wheezing or shortness of breath. (Patient not taking: Reported on 07/04/2017) 1 Inhaler 1  . guaiFENesin-codeine (ROBITUSSIN AC) 100-10 MG/5ML syrup Take 5 mLs by mouth 3 (three) times daily as needed for cough. (Patient not taking: Reported on 07/04/2017) 120 mL 0  . guaiFENesin-codeine 100-10 MG/5ML syrup Take 5 mLs by mouth every 6 (six) hours as needed for cough. (Patient not taking: Reported on 07/04/2017) 60 mL 0  . valACYclovir (VALTREX) 1000 MG tablet Take 1 tablet (1,000 mg total) by mouth 3 (three) times daily. (Patient not taking: Reported on 07/04/2017) 21 tablet 0   No current facility-administered medications for this visit.      PHYSICAL EXAM: Vitals:   07/04/17 1434  BP: (!) 167/76  Pulse: 87  Resp: 16  Temp: 98.4 F (36.9 C)  SpO2: 94%  Weight: 185 lb (83.9 kg)  Height: 5' (1.524 m)    GENERAL: The patient is a well-nourished female, in no acute distress. The vital signs are documented above. Bruising on her anterior thigh on the left from her arteriogram.  Feet are well perfused.  Reviewed her arteriogram films and discussed  these at length with the patient and her son present.  This does show severe common femoral artery disease bilaterally  MEDICAL ISSUES: I again explained that this is not limb threatening.  She has severe ischemia with Lynk Marti rest pain on the right.  She is not able to walk any degree related to this and is not able to tolerate this level of claudication.  I have recommended lateral femoral endarterectomy for symptom relief.  I explained the procedure in detail.  I feel comfortable with bilateral treatment in the same setting to reduce her recovery.  She does not have any cardiac history.  We will schedule the  procedure at her earliest convenience.  I explained expected to 3-day hospitalization for recovery   Rosetta Posner, MD [facility] Tel [phone] Pager [phone]

## 2017-07-04 NOTE — Progress Notes (Signed)
[facility] [place]  Patient name: Gina [name] MRN: [id number] DOB: [birth year] Sex: female  REASON FOR VISIT: Discuss recent aortogram with runoff from 06/16/2017                  HPI: Gina [name] is a 84 y.o. female here today for continued discussion of her severe limiting lower extremity claudication.  She is here today with her son.  She underwent outpatient arteriogram which revealed moderate stenosis of her proximal left common iliac artery which was corrected with angioplasty and stenting by Dr. Scot Dock.  He had complete occlusion of her common femoral artery on the right and subtotal occlusion of her common femoral artery on the left.  Had patency of her superficial femoral, popliteal and tibial vessels bilaterally.  He has severe claudication symptoms bilaterally right greater than left and early rest pain on the right  Current Outpatient Medications  Medication Sig Dispense Refill  . acetaminophen (TYLENOL) 500 MG tablet Take 500 mg by mouth every 6 (six) hours as needed. For pain    . allopurinol (ZYLOPRIM) 100 MG tablet Take 1 tablet (100 mg total) by mouth daily. 90 tablet 3  . amLODipine (NORVASC) 5 MG tablet Take 1 tablet (5 mg total) by mouth daily. 90 tablet 3  . Ascorbic Acid (VITAMIN C) 100 MG tablet Take 500 mg by mouth daily.     [name] atorvastatin (LIPITOR) 40 MG tablet Take 1 tablet (40 mg total) by mouth daily. 90 tablet 3  . Calcium Carbonate (CALCIUM 600 PO) Take 600 mg by mouth 2 (two) times daily.     . clopidogrel (PLAVIX) 75 MG tablet Take 1 tablet (75 mg total) by mouth daily. 90 tablet 3  . enalapril (VASOTEC) 10 MG tablet Take 1 tablet (10 mg total) by mouth daily. 90 tablet 3  . Loratadine 10 MG CAPS Take 10 mg by mouth daily.     . MULTIPLE VITAMIN PO Take 1 tablet by mouth daily.     . Multiple Vitamins-Minerals (OCUVITE PRESERVISION) TABS Take 1 tablet by mouth daily.     [name]  triamterene-hydrochlorothiazide (MAXZIDE-25) 37.5-25 MG tablet Take 1 tablet by mouth daily. 90 tablet 3  . albuterol (PROVENTIL HFA;VENTOLIN HFA) 108 (90 Base) MCG/ACT inhaler Inhale 2 puffs into the lungs every 6 (six) hours as needed for wheezing or shortness of breath. (Patient not taking: Reported on 07/04/2017) 1 Inhaler 1  . guaiFENesin-codeine (ROBITUSSIN AC) 100-10 MG/5ML syrup Take 5 mLs by mouth 3 (three) times daily as needed for cough. (Patient not taking: Reported on 07/04/2017) 120 mL 0  . guaiFENesin-codeine 100-10 MG/5ML syrup Take 5 mLs by mouth every 6 (six) hours as needed for cough. (Patient not taking: Reported on 07/04/2017) 60 mL 0  . valACYclovir (VALTREX) 1000 MG tablet Take 1 tablet (1,000 mg total) by mouth 3 (three) times daily. (Patient not taking: Reported on 07/04/2017) 21 tablet 0   No current facility-administered medications for this visit.      PHYSICAL EXAM: Vitals:   07/04/17 1434  BP: (!) 167/76  Pulse: 87  Resp: 16  Temp: 98.4 F (36.9 C)  SpO2: 94%  Weight: 185 lb (83.9 kg)  Height: 5' (1.524 m)    GENERAL: The patient is a well-nourished female, in no acute distress. The vital signs are documented above. Bruising on her anterior thigh on the left from her arteriogram.  Feet are well perfused.  Reviewed her arteriogram films and discussed  these at length with the patient and her son present.  This does show severe common femoral artery disease bilaterally  MEDICAL ISSUES: I again explained that this is not limb threatening.  She has severe ischemia with early rest pain on the right.  She is not able to walk any degree related to this and is not able to tolerate this level of claudication.  I have recommended lateral femoral endarterectomy for symptom relief.  I explained the procedure in detail.  I feel comfortable with bilateral treatment in the same setting to reduce her recovery.  She does not have any cardiac history.  We will schedule the  procedure at her earliest convenience.  I explained expected to 3-day hospitalization for recovery   Rosetta Posner, MD [facility] Tel [phone] Pager [phone]

## 2017-07-05 ENCOUNTER — Other Ambulatory Visit: Payer: Self-pay | Admitting: *Deleted

## 2017-07-18 ENCOUNTER — Encounter: Payer: Self-pay | Admitting: Internal Medicine

## 2017-07-21 NOTE — Pre-Procedure Instructions (Signed)
Gina [name]  07/21/2017      [facility] - [address], [address] AT [facility] OF [address] [place] [address] Phone: [phone] Fax: [phone]  [facility] [place] [place] to [address] [place] [address] Phone: [phone] Fax: [phone]    Your procedure is scheduled on June 19  Report to [place] at 945 A.M.  Call this number if you have problems the morning of surgery:  [phone]   Remember:  No food  Or drink after midnight.                      Take these medicines the morning of surgery with A SIP OF WATER : tylenol if needed, allopurinol (zyloprim),amlodipine (novasc), certirizine (zyrtec),                          7 days prior to surgery STOP taking Aleve, Naproxen, Ibuprofen, Motrin, Advil, Goody's, BC's, all herbal medications, fish oil, and all vitamins              Follow your doctors instructions regarding your Aspirin.  If no instructions were given by your doctor, then you will need to call the prescribing office office to get instructions.      Do not wear jewelry, make-up or nail polish.  Do not wear lotions, powders, or perfumes, or deodorant.  Do not shave 48 hours prior to surgery.  Men may shave face and neck.  Do not bring valuables to the hospital.  [facility] is not responsible for any belongings or valuables.  Contacts, dentures or bridgework may not be worn into surgery.  Leave your suitcase in the car.  After surgery it may be brought to your room.  For patients admitted to the hospital, discharge time will be determined by your treatment team.  Patients discharged the day of surgery will not be allowed to drive home.    Special instructions:   [place]- Preparing For Surgery  Before surgery, you can play an important role. Because skin is not sterile, your  skin needs to be as free of germs as possible. You can reduce the number of germs on your skin by washing with CHG (chlorahexidine gluconate) Soap before surgery.  CHG is an antiseptic cleaner which kills germs and bonds with the skin to continue killing germs even after washing.    Oral Hygiene is also important to reduce your risk of infection.  Remember - BRUSH YOUR TEETH THE MORNING OF SURGERY WITH YOUR REGULAR TOOTHPASTE  Please do not use if you have an allergy to CHG or antibacterial soaps. If your skin becomes reddened/irritated stop using the CHG.  Do not shave (including legs and underarms) for at least 48 hours prior to first CHG shower. It is OK to shave your face.  Please follow these instructions carefully.   1. Shower the NIGHT BEFORE SURGERY and the MORNING OF SURGERY with CHG.   2. If you chose to wash your hair, wash your hair first as usual with your normal shampoo.  3. After you shampoo, rinse your hair and body thoroughly to remove the shampoo.  4. Use CHG as you would any other liquid soap. You can apply CHG directly to the skin and wash gently with a  scrungie or a clean washcloth.   5. Apply the CHG Soap to your body ONLY FROM THE NECK DOWN.  Do not use on open wounds or open sores. Avoid contact with your eyes, ears, mouth and genitals (private parts). Wash Face and genitals (private parts)  with your normal soap.  6. Wash thoroughly, paying special attention to the area where your surgery will be performed.  7. Thoroughly rinse your body with warm water from the neck down.  8. DO NOT shower/wash with your normal soap after using and rinsing off the CHG Soap.  9. Pat yourself dry with a CLEAN TOWEL.  10. Wear CLEAN PAJAMAS to bed the night before surgery, wear comfortable clothes the morning of surgery  11. Place CLEAN SHEETS on your bed the night of your first shower and DO NOT SLEEP WITH PETS.    Day of Surgery:  Do not apply any deodorants/lotions.   Please wear clean clothes to the hospital/surgery center.   Remember to brush your teeth WITH YOUR REGULAR TOOTHPASTE.    Please read over the following fact sheets that you were given. Pain Booklet, Coughing and Deep Breathing, MRSA Information and Surgical Site Infection Prevention

## 2017-07-21 NOTE — Pre-Procedure Instructions (Signed)
Gina [name]  07/21/2017      [facility] - [address], [address] AT [facility] OF [address] [place] [address] Phone: [phone] Fax: [phone]  [facility] [place] [place] to [address] [place] [address] Phone: [phone] Fax: [phone]    Your procedure is scheduled on June 19  Report to [place] at 945 A.M.  Call this number if you have problems the morning of surgery:  [phone]   Remember:  No food  Or drink after midnight.                      Take these medicines the morning of surgery with A SIP OF WATER : tylenol if needed, allopurinol (zyloprim),amlodipine (novasc), certirizine (zyrtec),                   Do not wear jewelry, make-up or nail polish.  Do not wear lotions, powders, or perfumes, or deodorant.  Do not shave 48 hours prior to surgery.  Men may shave face and neck.  Do not bring valuables to the hospital.  [facility] is not responsible for any belongings or valuables.  Contacts, dentures or bridgework may not be worn into surgery.  Leave your suitcase in the car.  After surgery it may be brought to your room.  For patients admitted to the hospital, discharge time will be determined by your treatment team.  Patients discharged the day of surgery will not be allowed to drive home.    Special instructions:   [place]- Preparing For Surgery  Before surgery, you can play an important role. Because skin is not sterile, your skin needs to be as free of germs as possible. You can reduce the number of germs on your skin by washing with CHG (chlorahexidine gluconate) Soap before surgery.  CHG is an antiseptic cleaner which kills germs and bonds with the skin to continue killing germs even after washing.    Oral Hygiene is also important to reduce your risk of infection.   Remember - BRUSH YOUR TEETH THE MORNING OF SURGERY WITH YOUR REGULAR TOOTHPASTE  Please do not use if you have an allergy to CHG or antibacterial soaps. If your skin becomes reddened/irritated stop using the CHG.  Do not shave (including legs and underarms) for at least 48 hours prior to first CHG shower. It is OK to shave your face.  Please follow these instructions carefully.   1. Shower the NIGHT BEFORE SURGERY and the MORNING OF SURGERY with CHG.   2. If you chose to wash your hair, wash your hair first as usual with your normal shampoo.  3. After you shampoo, rinse your hair and body thoroughly to remove the shampoo.  4. Use CHG as you would any other liquid soap. You can apply CHG directly to the skin and wash gently with a scrungie or a clean washcloth.   5. Apply the CHG Soap to your body ONLY FROM THE NECK DOWN.  Do not use on open wounds or open sores. Avoid contact with your eyes, ears, mouth and genitals (private parts). Wash Face and genitals (private parts)  with your normal soap.  6. Wash thoroughly, paying special attention to the area where your surgery will be performed.  7. Thoroughly rinse your body with warm  water from the neck down.  8. DO NOT shower/wash with your normal soap after using and rinsing off the CHG Soap.  9. Pat yourself dry with a CLEAN TOWEL.  10. Wear CLEAN PAJAMAS to bed the night before surgery, wear comfortable clothes the morning of surgery  11. Place CLEAN SHEETS on your bed the night of your first shower and DO NOT SLEEP WITH PETS.    Day of Surgery:  Do not apply any deodorants/lotions.  Please wear clean clothes to the hospital/surgery center.   Remember to brush your teeth WITH YOUR REGULAR TOOTHPASTE.    Please read over the following fact sheets that you were given. Pain Booklet, Coughing and Deep Breathing, MRSA Information and Surgical Site Infection Prevention

## 2017-07-24 ENCOUNTER — Other Ambulatory Visit: Payer: Self-pay

## 2017-07-24 ENCOUNTER — Encounter (HOSPITAL_COMMUNITY)
Admission: RE | Admit: 2017-07-24 | Discharge: 2017-07-24 | Disposition: A | Discharge status: Home / Self Care | Payer: Medicare Other | Source: Ambulatory Visit | Attending: Vascular Surgery | Admitting: Vascular Surgery

## 2017-07-24 ENCOUNTER — Encounter (HOSPITAL_COMMUNITY): Payer: Self-pay

## 2017-07-24 DIAGNOSIS — Z01812 Encounter for preprocedural laboratory examination: Secondary | ICD-10-CM | POA: Diagnosis not present

## 2017-07-24 HISTORY — DX: Pneumonia, unspecified organism: J18.9

## 2017-07-24 HISTORY — DX: Unspecified macular degeneration: H35.30

## 2017-07-24 LAB — COMPREHENSIVE METABOLIC PANEL
ALT: 20 U/L (ref 14–54)
AST: 26 U/L (ref 15–41)
Albumin: 4 g/dL (ref 3.5–5.0)
Alkaline Phosphatase: 73 U/L (ref 38–126)
Anion gap: 10 (ref 5–15)
BUN: 18 mg/dL (ref 6–20)
CO2: 30 mmol/L (ref 22–32)
Calcium: 9.9 mg/dL (ref 8.9–10.3)
Chloride: 99 mmol/L — ABNORMAL LOW (ref 101–111)
Creatinine, Ser: 0.9 mg/dL (ref 0.44–1.00)
GFR calc Af Amer: >60 mL/min (ref >60)
GFR calc non Af Amer: 57 mL/min — ABNORMAL LOW (ref >60)
Glucose, Bld: 98 mg/dL (ref 65–99)
Potassium: 3.5 mmol/L (ref 3.5–5.1)
Sodium: 139 mmol/L (ref 135–145)
Total Bilirubin: 1 mg/dL (ref 0.3–1.2)
Total Protein: 7.2 g/dL (ref 6.5–8.1)

## 2017-07-24 LAB — URINALYSIS, ROUTINE W REFLEX MICROSCOPIC
Bilirubin Urine: NEGATIVE
Glucose, UA: NEGATIVE mg/dL
Hgb urine dipstick: NEGATIVE
Ketones, ur: NEGATIVE mg/dL
Nitrite: NEGATIVE
Protein, ur: NEGATIVE mg/dL
Specific Gravity, Urine: 1.01 (ref 1.005–1.030)
WBC, UA: >50 WBC/hpf — ABNORMAL HIGH (ref 0–5)
pH: 7 (ref 5.0–8.0)

## 2017-07-24 LAB — SURGICAL PCR SCREEN
MRSA, PCR: POSITIVE — AB
Staphylococcus aureus: POSITIVE — AB

## 2017-07-24 LAB — TYPE AND SCREEN
ABO/RH(D): B POS
Antibody Screen: NEGATIVE

## 2017-07-24 LAB — CBC
HCT: 43.7 % (ref 36.0–46.0)
Hemoglobin: 14 g/dL (ref 12.0–15.0)
MCH: 33.1 pg (ref 26.0–34.0)
MCHC: 32 g/dL (ref 30.0–36.0)
MCV: 103.3 fL — ABNORMAL HIGH (ref 78.0–100.0)
Platelets: 190 10*3/uL (ref 150–400)
RBC: 4.23 MIL/uL (ref 3.87–5.11)
RDW: 14.7 % (ref 11.5–15.5)
WBC: 5.4 10*3/uL (ref 4.0–10.5)

## 2017-07-24 LAB — PROTIME-INR
INR: 0.96
Prothrombin Time: 12.7 seconds (ref 11.4–15.2)

## 2017-07-24 LAB — APTT: aPTT: 35 seconds (ref 24–36)

## 2017-07-24 NOTE — Progress Notes (Signed)
PCP is Dr. Loni Muse Plotnikov   LOV 05/2017 Had TIA back in 2011. Recently had angio/arteriogram by Dr. Scot Dock in May 2019.  Has been on plavix and she will be stopping it on 07/27/2017. Denies cp, sob, doe.  Did have echo back in 2011

## 2017-07-24 NOTE — Pre-Procedure Instructions (Signed)
Gina [name]  07/24/2017      [facility] - [address], [address] AT [facility] OF [address] [place] [address] Phone: [phone] Fax: [phone]  [facility] [place] [place] to [address] [place] [address] Phone: [phone] Fax: [phone]    Your procedure is scheduled on Wednesday, June 19th   Report to [facility] at 945 A.M.             (posted surgery time 11:45a - 2:15p)   Call this number if you have problems the morning of surgery:  [phone]   Remember:  Nothing to eat nor drink after midnight, Tuesday, with the below exceptions:                      Take these medicines the morning of surgery with A SIP OF WATER : tylenol if needed, allopurinol (zyloprim),amlodipine (novasc), certirizine (zyrtec),                          7 days prior to surgery STOP taking Aleve, Naproxen, Ibuprofen, Motrin, Advil, Goody's, BC's, all herbal medications, fish oil, and all vitamins              Follow your doctors instructions regarding your Aspirin.  If no instructions were given by your doctor, then you will need to call the prescribing office office to get instructions.      Do not wear jewelry, make-up or nail polish.  Do not wear lotions, powders,  perfumes, or deodorant.  Do not shave 48 hours prior to surgery.    Do not bring valuables to the hospital.  [facility] is not responsible for any belongings or valuables.  Contacts, dentures or bridgework may not be worn into surgery.  Leave your suitcase in the car.  After surgery it may be brought to your room.  For patients admitted to the hospital, discharge time will be determined by your treatment team.     Special instructions:   [facility]- Preparing For Surgery  Before surgery, you can play an important role. Because skin is not sterile,  your skin needs to be as free of germs as possible. You can reduce the number of germs on your skin by washing with CHG (chlorahexidine gluconate) Soap before surgery.  CHG is an antiseptic cleaner which kills germs and bonds with the skin to continue killing germs even after washing.    Oral Hygiene is also important to reduce your risk of infection.    Remember - BRUSH YOUR TEETH THE MORNING OF SURGERY WITH YOUR REGULAR TOOTHPASTE  Please do not use if you have an allergy to CHG or antibacterial soaps. If your skin becomes reddened/irritated stop using the CHG.  Do not shave (including legs and underarms) for at least 48 hours prior to first CHG shower. It is OK to shave your face.  Please follow these instructions carefully.   1. Shower the NIGHT BEFORE SURGERY and the MORNING OF SURGERY with CHG.   2. If you chose to wash your hair, wash your hair first as usual with your normal shampoo.  3. After you shampoo, rinse your hair and body thoroughly to remove the shampoo.  4. Use CHG as you would any other liquid soap. You can apply  CHG directly to the skin and wash gently with a scrungie or a clean washcloth.   5. Apply the CHG Soap to your body ONLY FROM THE NECK DOWN.  Do not use on open wounds or open sores. Avoid contact with your eyes, ears, mouth and genitals (private parts). Wash Face and genitals (private parts)  with your normal soap.  6. Wash thoroughly, paying special attention to the area where your surgery will be performed.  7. Thoroughly rinse your body with warm water from the neck down.  8. DO NOT shower/wash with your normal soap after using and rinsing off the CHG Soap.  9. Pat yourself dry with a CLEAN TOWEL.  10. Wear CLEAN PAJAMAS to bed the night before surgery, wear comfortable clothes the morning of surgery  11. Place CLEAN SHEETS on your bed the night of your first shower and DO NOT SLEEP WITH PETS.    Day of Surgery:  Do not apply any  deodorants/lotions.  Please wear clean clothes to the hospital/surgery center.    Remember to brush your teeth WITH YOUR REGULAR TOOTHPASTE.    Please read over the following fact sheets that you were given. Pain Booklet, Coughing and Deep Breathing, MRSA Information and Surgical Site Infection Prevention

## 2017-07-28 ENCOUNTER — Ambulatory Visit: Payer: Medicare Other | Admitting: Internal Medicine

## 2017-08-01 MED ORDER — VANCOMYCIN HCL IN DEXTROSE 1-5 GM/200ML-% IV SOLN
1000.0000 mg | INTRAVENOUS | Status: AC
  Administered 2017-08-02: 1000 mg via INTRAVENOUS
  Filled 2017-08-01: qty 200

## 2017-08-02 ENCOUNTER — Encounter (HOSPITAL_COMMUNITY)
Admission: RE | Disposition: A | Discharge status: Home / Self Care | Payer: Self-pay | Source: Ambulatory Visit | Attending: Vascular Surgery

## 2017-08-02 ENCOUNTER — Inpatient Hospital Stay (HOSPITAL_COMMUNITY): Payer: Medicare Other | Admitting: Anesthesiology

## 2017-08-02 ENCOUNTER — Encounter (HOSPITAL_COMMUNITY): Payer: Self-pay | Admitting: *Deleted

## 2017-08-02 ENCOUNTER — Ambulatory Visit: Payer: Medicare Other | Admitting: Internal Medicine

## 2017-08-02 ENCOUNTER — Inpatient Hospital Stay (HOSPITAL_COMMUNITY)
Admission: RE | Admit: 2017-08-02 | Discharge: 2017-08-04 | DRG: 254 | Disposition: A | Discharge status: Home / Self Care | Payer: Medicare Other | Source: Ambulatory Visit | Attending: Vascular Surgery | Admitting: Vascular Surgery

## 2017-08-02 DIAGNOSIS — Z7902 Long term (current) use of antithrombotics/antiplatelets: Secondary | ICD-10-CM | POA: Diagnosis not present

## 2017-08-02 DIAGNOSIS — H3530 Unspecified macular degeneration: Secondary | ICD-10-CM | POA: Diagnosis present

## 2017-08-02 DIAGNOSIS — Z85038 Personal history of other malignant neoplasm of large intestine: Secondary | ICD-10-CM

## 2017-08-02 DIAGNOSIS — I70203 Unspecified atherosclerosis of native arteries of extremities, bilateral legs: Secondary | ICD-10-CM | POA: Diagnosis not present

## 2017-08-02 DIAGNOSIS — Z9889 Other specified postprocedural states: Secondary | ICD-10-CM | POA: Diagnosis not present

## 2017-08-02 DIAGNOSIS — Z6835 Body mass index (BMI) 35.0-35.9, adult: Secondary | ICD-10-CM | POA: Diagnosis not present

## 2017-08-02 DIAGNOSIS — I70213 Atherosclerosis of native arteries of extremities with intermittent claudication, bilateral legs: Secondary | ICD-10-CM | POA: Diagnosis not present

## 2017-08-02 DIAGNOSIS — I10 Essential (primary) hypertension: Secondary | ICD-10-CM | POA: Diagnosis present

## 2017-08-02 DIAGNOSIS — Z87891 Personal history of nicotine dependence: Secondary | ICD-10-CM | POA: Diagnosis not present

## 2017-08-02 DIAGNOSIS — I739 Peripheral vascular disease, unspecified: Secondary | ICD-10-CM | POA: Diagnosis not present

## 2017-08-02 DIAGNOSIS — Z8673 Personal history of transient ischemic attack (TIA), and cerebral infarction without residual deficits: Secondary | ICD-10-CM

## 2017-08-02 DIAGNOSIS — J449 Chronic obstructive pulmonary disease, unspecified: Secondary | ICD-10-CM | POA: Diagnosis present

## 2017-08-02 DIAGNOSIS — Z79899 Other long term (current) drug therapy: Secondary | ICD-10-CM | POA: Diagnosis not present

## 2017-08-02 DIAGNOSIS — E785 Hyperlipidemia, unspecified: Secondary | ICD-10-CM | POA: Diagnosis present

## 2017-08-02 HISTORY — PX: ENDARTERECTOMY FEMORAL: SHX5804

## 2017-08-02 HISTORY — DX: Peripheral vascular disease, unspecified: I73.9

## 2017-08-02 LAB — CBC
HCT: 40.2 % (ref 36.0–46.0)
Hemoglobin: 12.8 g/dL (ref 12.0–15.0)
MCH: 33.1 pg (ref 26.0–34.0)
MCHC: 31.8 g/dL (ref 30.0–36.0)
MCV: 103.9 fL — ABNORMAL HIGH (ref 78.0–100.0)
Platelets: DECREASED 10*3/uL (ref 150–400)
RBC: 3.87 MIL/uL (ref 3.87–5.11)
RDW: 14.4 % (ref 11.5–15.5)
WBC: 9.5 10*3/uL (ref 4.0–10.5)

## 2017-08-02 LAB — CREATININE, SERUM
Creatinine, Ser: 0.68 mg/dL (ref 0.44–1.00)
GFR calc Af Amer: >60 mL/min (ref >60)
GFR calc non Af Amer: >60 mL/min (ref >60)

## 2017-08-02 SURGERY — ENDARTERECTOMY, FEMORAL
Anesthesia: General | Site: Groin | Laterality: Bilateral

## 2017-08-02 MED ORDER — SODIUM CHLORIDE 0.9 % IV SOLN: 500.0000 mL | Freq: Once | INTRAVENOUS | Status: DC | PRN

## 2017-08-02 MED ORDER — POTASSIUM CHLORIDE CRYS ER 20 MEQ PO TBCR: 20.0000 meq | EXTENDED_RELEASE_TABLET | Freq: Every day | ORAL | Status: DC | PRN

## 2017-08-02 MED ORDER — ENALAPRIL MALEATE 10 MG PO TABS
10.0000 mg | ORAL_TABLET | Freq: Every day | ORAL | Status: DC
  Administered 2017-08-02 – 2017-08-04 (×3): 10 mg via ORAL
  Filled 2017-08-02 (×3): qty 1

## 2017-08-02 MED ORDER — FENTANYL CITRATE (PF) 250 MCG/5ML IJ SOLN
INTRAMUSCULAR | Status: AC
  Filled 2017-08-02: qty 5

## 2017-08-02 MED ORDER — DOCUSATE SODIUM 100 MG PO CAPS
100.0000 mg | ORAL_CAPSULE | Freq: Every day | ORAL | Status: DC
  Administered 2017-08-03 – 2017-08-04 (×2): 100 mg via ORAL
  Filled 2017-08-02 (×2): qty 1

## 2017-08-02 MED ORDER — CHLORHEXIDINE GLUCONATE 4 % EX LIQD: 60.0000 mL | Freq: Once | CUTANEOUS | Status: DC

## 2017-08-02 MED ORDER — SODIUM CHLORIDE 0.9 % IV SOLN
INTRAVENOUS | Status: DC
  Administered 2017-08-02: 13:00:00 via INTRAVENOUS

## 2017-08-02 MED ORDER — ROCURONIUM BROMIDE 100 MG/10ML IV SOLN
INTRAVENOUS | Status: DC | PRN
  Administered 2017-08-02: 50 mg via INTRAVENOUS
  Administered 2017-08-02: 10 mg via INTRAVENOUS

## 2017-08-02 MED ORDER — ONDANSETRON HCL 4 MG/2ML IJ SOLN
INTRAMUSCULAR | Status: AC
  Filled 2017-08-02: qty 2

## 2017-08-02 MED ORDER — SODIUM CHLORIDE 0.9 % IV SOLN
INTRAVENOUS | Status: DC
  Administered 2017-08-02 – 2017-08-03 (×2): via INTRAVENOUS

## 2017-08-02 MED ORDER — 0.9 % SODIUM CHLORIDE (POUR BTL) OPTIME
TOPICAL | Status: DC | PRN
  Administered 2017-08-02: 2000 mL

## 2017-08-02 MED ORDER — LIDOCAINE 2% (20 MG/ML) 5 ML SYRINGE
INTRAMUSCULAR | Status: AC
  Filled 2017-08-02: qty 5

## 2017-08-02 MED ORDER — CLINDAMYCIN PHOSPHATE 300 MG/50ML IV SOLN
300.0000 mg | Freq: Three times a day (TID) | INTRAVENOUS | Status: AC
  Administered 2017-08-02 – 2017-08-03 (×2): 300 mg via INTRAVENOUS
  Filled 2017-08-02 (×2): qty 50

## 2017-08-02 MED ORDER — CLOPIDOGREL BISULFATE 75 MG PO TABS
75.0000 mg | ORAL_TABLET | Freq: Every day | ORAL | Status: DC
  Administered 2017-08-03: 75 mg via ORAL
  Filled 2017-08-02 (×2): qty 1

## 2017-08-02 MED ORDER — ALUM & MAG HYDROXIDE-SIMETH 200-200-20 MG/5ML PO SUSP
15.0000 mL | ORAL | Status: DC | PRN
Start: 2017-08-02 — End: 2017-08-04

## 2017-08-02 MED ORDER — ACETAMINOPHEN 325 MG PO TABS
325.0000 mg | ORAL_TABLET | ORAL | Status: DC | PRN
  Administered 2017-08-03: 650 mg via ORAL
  Filled 2017-08-02: qty 2

## 2017-08-02 MED ORDER — ALBUTEROL SULFATE (2.5 MG/3ML) 0.083% IN NEBU: 2.5000 mg | INHALATION_SOLUTION | Freq: Four times a day (QID) | RESPIRATORY_TRACT | Status: DC | PRN

## 2017-08-02 MED ORDER — FENTANYL CITRATE (PF) 100 MCG/2ML IJ SOLN
25.0000 ug | INTRAMUSCULAR | Status: DC | PRN
  Administered 2017-08-02 (×2): 50 ug via INTRAVENOUS

## 2017-08-02 MED ORDER — PROPOFOL 10 MG/ML IV BOLUS
INTRAVENOUS | Status: AC
  Filled 2017-08-02: qty 20

## 2017-08-02 MED ORDER — SUGAMMADEX SODIUM 200 MG/2ML IV SOLN
INTRAVENOUS | Status: AC
  Filled 2017-08-02: qty 2

## 2017-08-02 MED ORDER — HEPARIN SODIUM (PORCINE) 1000 UNIT/ML IJ SOLN
INTRAMUSCULAR | Status: DC | PRN
  Administered 2017-08-02: 8000 [IU] via INTRAVENOUS

## 2017-08-02 MED ORDER — POLYETHYLENE GLYCOL 3350 17 G PO PACK: 17.0000 g | PACK | Freq: Every day | ORAL | Status: DC | PRN

## 2017-08-02 MED ORDER — LIDOCAINE HCL (CARDIAC) PF 100 MG/5ML IV SOSY
PREFILLED_SYRINGE | INTRAVENOUS | Status: DC | PRN
  Administered 2017-08-02: 30 mg via INTRAVENOUS

## 2017-08-02 MED ORDER — DEXAMETHASONE SODIUM PHOSPHATE 10 MG/ML IJ SOLN
INTRAMUSCULAR | Status: AC
  Filled 2017-08-02: qty 1

## 2017-08-02 MED ORDER — AMLODIPINE BESYLATE 5 MG PO TABS
5.0000 mg | ORAL_TABLET | Freq: Every day | ORAL | Status: DC
  Administered 2017-08-03 – 2017-08-04 (×2): 5 mg via ORAL
  Filled 2017-08-02 (×3): qty 1

## 2017-08-02 MED ORDER — ACETAMINOPHEN 325 MG RE SUPP: 325.0000 mg | RECTAL | Status: DC | PRN

## 2017-08-02 MED ORDER — ONDANSETRON HCL 4 MG/2ML IJ SOLN: 4.0000 mg | Freq: Four times a day (QID) | INTRAMUSCULAR | Status: DC | PRN

## 2017-08-02 MED ORDER — SODIUM CHLORIDE 0.9 % IV SOLN
INTRAVENOUS | Status: DC | PRN
  Administered 2017-08-02: 500 mL

## 2017-08-02 MED ORDER — LACTATED RINGERS IV SOLN: INTRAVENOUS | Status: DC

## 2017-08-02 MED ORDER — ATORVASTATIN CALCIUM 40 MG PO TABS
40.0000 mg | ORAL_TABLET | Freq: Every evening | ORAL | Status: DC
  Administered 2017-08-02 – 2017-08-03 (×2): 40 mg via ORAL
  Filled 2017-08-02 (×2): qty 1

## 2017-08-02 MED ORDER — PROPOFOL 10 MG/ML IV BOLUS
INTRAVENOUS | Status: DC | PRN
  Administered 2017-08-02: 120 mg via INTRAVENOUS
  Administered 2017-08-02: 50 mg via INTRAVENOUS

## 2017-08-02 MED ORDER — FENTANYL CITRATE (PF) 100 MCG/2ML IJ SOLN
INTRAMUSCULAR | Status: DC | PRN
  Administered 2017-08-02: 100 ug via INTRAVENOUS

## 2017-08-02 MED ORDER — GUAIFENESIN-DM 100-10 MG/5ML PO SYRP: 15.0000 mL | ORAL_SOLUTION | ORAL | Status: DC | PRN

## 2017-08-02 MED ORDER — LABETALOL HCL 5 MG/ML IV SOLN
10.0000 mg | INTRAVENOUS | Status: DC | PRN
  Filled 2017-08-02: qty 4

## 2017-08-02 MED ORDER — PHENYLEPHRINE HCL 10 MG/ML IJ SOLN
INTRAMUSCULAR | Status: DC | PRN
  Administered 2017-08-02: 100 ug via INTRAVENOUS

## 2017-08-02 MED ORDER — METOPROLOL TARTRATE 5 MG/5ML IV SOLN: 2.0000 mg | INTRAVENOUS | Status: DC | PRN

## 2017-08-02 MED ORDER — LORATADINE 10 MG PO TABS
10.0000 mg | ORAL_TABLET | Freq: Every day | ORAL | Status: DC
  Administered 2017-08-03 – 2017-08-04 (×2): 10 mg via ORAL
  Filled 2017-08-02 (×2): qty 1

## 2017-08-02 MED ORDER — SUGAMMADEX SODIUM 200 MG/2ML IV SOLN
INTRAVENOUS | Status: DC | PRN
  Administered 2017-08-02: 200 mg via INTRAVENOUS

## 2017-08-02 MED ORDER — DEXTROSE 5 % IV SOLN
INTRAVENOUS | Status: DC | PRN
  Administered 2017-08-02: 50 ug/min via INTRAVENOUS

## 2017-08-02 MED ORDER — PANTOPRAZOLE SODIUM 40 MG PO TBEC
40.0000 mg | DELAYED_RELEASE_TABLET | Freq: Every day | ORAL | Status: DC
  Administered 2017-08-02 – 2017-08-04 (×3): 40 mg via ORAL
  Filled 2017-08-02 (×3): qty 1

## 2017-08-02 MED ORDER — HEPARIN SODIUM (PORCINE) 5000 UNIT/ML IJ SOLN
5000.0000 [IU] | Freq: Three times a day (TID) | INTRAMUSCULAR | Status: DC
  Administered 2017-08-02 – 2017-08-04 (×5): 5000 [IU] via SUBCUTANEOUS
  Filled 2017-08-02 (×5): qty 1

## 2017-08-02 MED ORDER — FENTANYL CITRATE (PF) 100 MCG/2ML IJ SOLN
INTRAMUSCULAR | Status: AC
  Filled 2017-08-02: qty 2

## 2017-08-02 MED ORDER — OXYCODONE-ACETAMINOPHEN 5-325 MG PO TABS: 1.0000 | ORAL_TABLET | ORAL | Status: DC | PRN

## 2017-08-02 MED ORDER — ROCURONIUM BROMIDE 50 MG/5ML IV SOLN
INTRAVENOUS | Status: AC
  Filled 2017-08-02: qty 1

## 2017-08-02 MED ORDER — PHENOL 1.4 % MT LIQD: 1.0000 | OROMUCOSAL | Status: DC | PRN

## 2017-08-02 MED ORDER — VITAMIN C 500 MG PO TABS
500.0000 mg | ORAL_TABLET | Freq: Every evening | ORAL | Status: DC
  Administered 2017-08-02 – 2017-08-03 (×2): 500 mg via ORAL
  Filled 2017-08-02 (×2): qty 1

## 2017-08-02 MED ORDER — PROTAMINE SULFATE 10 MG/ML IV SOLN
INTRAVENOUS | Status: DC | PRN
  Administered 2017-08-02 (×5): 10 mg via INTRAVENOUS

## 2017-08-02 MED ORDER — HEPARIN SODIUM (PORCINE) 5000 UNIT/ML IJ SOLN
INTRAMUSCULAR | Status: AC
  Filled 2017-08-02: qty 1.2

## 2017-08-02 MED ORDER — MORPHINE SULFATE (PF) 4 MG/ML IV SOLN: 4.0000 mg | INTRAVENOUS | Status: DC | PRN

## 2017-08-02 MED ORDER — TRIAMTERENE-HCTZ 37.5-25 MG PO TABS
1.0000 | ORAL_TABLET | Freq: Every day | ORAL | Status: DC
  Administered 2017-08-02 – 2017-08-04 (×3): 1 via ORAL
  Filled 2017-08-02 (×3): qty 1

## 2017-08-02 MED ORDER — DEXAMETHASONE SODIUM PHOSPHATE 10 MG/ML IJ SOLN
INTRAMUSCULAR | Status: DC | PRN
  Administered 2017-08-02: 10 mg via INTRAVENOUS

## 2017-08-02 MED ORDER — HYDRALAZINE HCL 20 MG/ML IJ SOLN
5.0000 mg | INTRAMUSCULAR | Status: DC | PRN
  Filled 2017-08-02: qty 0.25

## 2017-08-02 MED ORDER — LACTATED RINGERS IV SOLN
INTRAVENOUS | Status: DC | PRN
  Administered 2017-08-02 (×2): via INTRAVENOUS

## 2017-08-02 MED ORDER — MAGNESIUM SULFATE 2 GM/50ML IV SOLN: 2.0000 g | Freq: Every day | INTRAVENOUS | Status: DC | PRN

## 2017-08-02 SURGICAL SUPPLY — 39 items
BENZOIN TINCTURE PRP APPL 2/3 (GAUZE/BANDAGES/DRESSINGS) ×4 IMPLANT
CANISTER SUCT 3000ML PPV (MISCELLANEOUS) ×2 IMPLANT
CANNULA VESSEL 3MM 2 BLNT TIP (CANNULA) ×2 IMPLANT
CLIP LIGATING EXTRA MED SLVR (CLIP) ×2 IMPLANT
CLIP LIGATING EXTRA SM BLUE (MISCELLANEOUS) ×2 IMPLANT
DERMABOND ADVANCED (GAUZE/BANDAGES/DRESSINGS) ×3
DERMABOND ADVANCED .7 DNX12 (GAUZE/BANDAGES/DRESSINGS) ×3 IMPLANT
DRAIN SNY 10X20 3/4 PERF (WOUND CARE) IMPLANT
ELECT REM PT RETURN 9FT ADLT (ELECTROSURGICAL) ×2
ELECTRODE REM PT RTRN 9FT ADLT (ELECTROSURGICAL) ×1 IMPLANT
EVACUATOR SILICONE 100CC (DRAIN) IMPLANT
GLOVE BIO SURGEON STRL SZ 6.5 (GLOVE) ×4 IMPLANT
GLOVE BIO SURGEON STRL SZ7.5 (GLOVE) ×6 IMPLANT
GLOVE BIOGEL PI IND STRL 7.0 (GLOVE) ×3 IMPLANT
GLOVE BIOGEL PI INDICATOR 7.0 (GLOVE) ×3
GLOVE SS BIOGEL STRL SZ 7.5 (GLOVE) ×1 IMPLANT
GLOVE SUPERSENSE BIOGEL SZ 7.5 (GLOVE) ×1
GLOVE SURG SS PI 7.0 STRL IVOR (GLOVE) ×2 IMPLANT
GOWN STRL REUS W/ TWL LRG LVL3 (GOWN DISPOSABLE) ×5 IMPLANT
GOWN STRL REUS W/ TWL XL LVL3 (GOWN DISPOSABLE) ×1 IMPLANT
GOWN STRL REUS W/TWL LRG LVL3 (GOWN DISPOSABLE) ×5
GOWN STRL REUS W/TWL XL LVL3 (GOWN DISPOSABLE) ×1
KIT BASIN OR (CUSTOM PROCEDURE TRAY) ×2 IMPLANT
KIT TURNOVER KIT B (KITS) ×2 IMPLANT
LOOP VESSEL MAXI BLUE (MISCELLANEOUS) ×6 IMPLANT
NS IRRIG 1000ML POUR BTL (IV SOLUTION) ×4 IMPLANT
PACK PERIPHERAL VASCULAR (CUSTOM PROCEDURE TRAY) ×2 IMPLANT
PAD ARMBOARD 7.5X6 YLW CONV (MISCELLANEOUS) ×4 IMPLANT
PATCH HEMASHIELD 8X150 (Vascular Products) ×2 IMPLANT
SUT ETHILON 3 0 PS 1 (SUTURE) IMPLANT
SUT PROLENE 5 0 C 1 24 (SUTURE) ×2 IMPLANT
SUT PROLENE 6 0 CC (SUTURE) ×16 IMPLANT
SUT VIC AB 2-0 CTX 36 (SUTURE) ×4 IMPLANT
SUT VIC AB 3-0 SH 27 (SUTURE) ×2
SUT VIC AB 3-0 SH 27X BRD (SUTURE) ×2 IMPLANT
TOWEL GREEN STERILE (TOWEL DISPOSABLE) ×2 IMPLANT
TRAY FOLEY MTR SLVR 16FR STAT (SET/KITS/TRAYS/PACK) IMPLANT
UNDERPAD 30X30 (UNDERPADS AND DIAPERS) ×2 IMPLANT
WATER STERILE IRR 1000ML POUR (IV SOLUTION) ×2 IMPLANT

## 2017-08-02 NOTE — Progress Notes (Signed)
Patient transferred to 4E22, receiving RN at bedside, patient placed on tele, no questions or concerns from patient or receiving RN, family updated of patients location by volunteers.  Rowe Pavy, RN

## 2017-08-02 NOTE — Anesthesia Procedure Notes (Deleted)
Performed by: Eligha Bridegroom, CRNA

## 2017-08-02 NOTE — Op Note (Signed)
    OPERATIVE REPORT  DATE OF SURGERY: 08/02/2017  PATIENT: Gina [name], 84 y.o. female MRN: [id number]  DOB: [birth year]  PRE-OPERATIVE DIAGNOSIS: Bilateral lower extremity arterial insufficiency  POST-OPERATIVE DIAGNOSIS:  Same  PROCEDURE: Bilateral femoral endarterectomy and Dacron patch angioplasty  SURGEON:  Curt Jews, M.D.  PHYSICIAN ASSISTANT: Dr. Juanda Crumble fields, Liana Crocker, PA-C, [place], [place]  ANESTHESIA: General  EBL: Minimal ml  Total I/O In: 1000 [I.V.:1000] Out: 250 [Urine:250]  BLOOD ADMINISTERED: None  DRAINS: None  SPECIMEN: None  COUNTS CORRECT:  YES  PLAN OF CARE: PACU  PATIENT DISPOSITION:  PACU - hemodynamically stable  PROCEDURE DETAILS: Patient was taken to the operative placed supine position with the area of both groins prepped draped you sterile fashion incisions were made over the groin crease bilaterally and carried down to isolate the common superficial femoral and profundus femoris arteries bilaterally.  These were encircled with Vesseloops.  Dissection extended up under the inguinal ligament to give exposure to the external iliac artery above the plaque.  Patient was given 8000 units of intravenous heparin after adequate circulation time the common superficial femoral and profundus femoris arteries were occluded.  The common femoral artery was opened with an 11 blade and extended longitudinally with Potts scissors onto the external iliac at the inguinal ligament and down onto the superficial femoral artery.  Endarterectomies were undertaken bilaterally.  The plaque was transected in the superficial femoral artery.  The deep femoral arteries were endarterectomized with an eversion technique.  The plaque was endarterectomized up under the inguinal ligament bilaterally.  Remaining atheromatous debris was removed from the endarterectomy plane.  The tap the plaque was tacked distally at the superficial femoral artery bilaterally.  Dacron  Hemashield patch was sewn on both right and left groin as a patch angioplasty with a running 6-0 Prolene suture.  The usual flushing maneuvers were undertaken prior to completion of the closure.  The anastomosis completed and flow was restored.  The patient was given 50 mg of protamine to reverse heparin.  Wounds irrigated with saline.  Hemostasis talus cautery.  Wounds were closed with 2-0 Vicryl in the subcutaneous tissue and the skin was closed with 3 oh sub-particular Vicryl sutures.  Sterile dressing was applied and the patient was taken to the recovery room in stable condition   Gina [name], M.D., [facility] 08/02/2017 3:05 PM

## 2017-08-02 NOTE — Anesthesia Preprocedure Evaluation (Addendum)
Anesthesia Evaluation  Patient identified by MRN, date of birth, ID band Patient awake    Reviewed: Allergy & Precautions, NPO status , Patient's Chart, lab work & pertinent test results  History of Anesthesia Complications Negative for: history of anesthetic complications  Airway Mallampati: II  TM Distance: >3 FB Neck ROM: Full    Dental  (+) Caps, Dental Advisory Given   Pulmonary COPD,  COPD inhaler, former smoker (quit 1989),    breath sounds clear to auscultation       Cardiovascular hypertension, Pt. on medications (-) angina+ Peripheral Vascular Disease   Rhythm:Regular Rate:Normal  '11 ECHO: EF 60-65%, valves OK   Neuro/Psych TIA   GI/Hepatic Neg liver ROS, neg GERD  ,Colon cancer   Endo/Other  Morbid obesity  Renal/GU negative Renal ROS     Musculoskeletal  (+) Arthritis , Osteoarthritis,    Abdominal (+) + obese,   Peds  Hematology plavix   Anesthesia Other Findings   Reproductive/Obstetrics                            Anesthesia Physical Anesthesia Plan  ASA: III  Anesthesia Plan: General   Post-op Pain Management:    Induction:   PONV Risk Score and Plan: 3 and Ondansetron and Dexamethasone  Airway Management Planned: Oral ETT  Additional Equipment:   Intra-op Plan:   Post-operative Plan: Extubation in OR  Informed Consent: I have reviewed the patients History and Physical, chart, labs and discussed the procedure including the risks, benefits and alternatives for the proposed anesthesia with the patient or authorized representative who has indicated his/her understanding and acceptance.   Dental advisory given  Plan Discussed with: CRNA and Surgeon  Anesthesia Plan Comments: (Plan routine monitors, GETA)        Anesthesia Quick Evaluation

## 2017-08-02 NOTE — Transfer of Care (Signed)
Immediate Anesthesia Transfer of Care Note  Patient: Gina [name]  Procedure(s) Performed: Bilateral Femoral ENDARTERECTOMY with Patch Angioplasty (Bilateral Groin)  Patient Location: PACU  Anesthesia Type:General  Level of Consciousness: awake, alert  and oriented  Airway & Oxygen Therapy: Patient Spontanous Breathing and Patient connected to nasal cannula oxygen  Post-op Assessment: Report given to RN  Post vital signs: Reviewed and stable  Last Vitals:  Vitals Value Taken Time  BP 156/68 08/02/2017  3:13 PM  Temp    Pulse 101 08/02/2017  3:25 PM  Resp 17 08/02/2017  3:25 PM  SpO2 100 % 08/02/2017  3:25 PM  Vitals shown include unvalidated device data.  Last Pain:  Vitals:   08/02/17 1017  PainSc: 0-No pain      Patients Stated Pain Goal: 3 (40/81/44 8185)  Complications: No apparent anesthesia complications

## 2017-08-02 NOTE — Anesthesia Postprocedure Evaluation (Signed)
Anesthesia Post Note  Patient: KENNADY ZIMMERLE  Procedure(s) Performed: Bilateral Femoral ENDARTERECTOMY with Patch Angioplasty (Bilateral Groin)     Patient location during evaluation: PACU Anesthesia Type: General Level of consciousness: awake and alert, oriented and patient cooperative Pain management: pain level controlled Vital Signs Assessment: post-procedure vital signs reviewed and stable Respiratory status: spontaneous breathing, nonlabored ventilation and respiratory function stable Cardiovascular status: blood pressure returned to baseline and stable Postop Assessment: no apparent nausea or vomiting (no sore throat) Anesthetic complications: no Comments: Discussed difficult intubation with patient, she understands and will keep the letter in her health files should she need anesthetic care in the future.    Last Vitals:  Vitals:   08/02/17 1615 08/02/17 1620  BP:    Pulse: 91 96  Resp: 11 16  Temp:    SpO2: 96% 94%    Last Pain:  Vitals:   08/02/17 1620  PainSc: 3                  Mc Hollen,E. Ashanta Amoroso

## 2017-08-02 NOTE — Interval H&P Note (Signed)
History and Physical Interval Note:  08/02/2017 11:24 AM  Gina [name]  has presented today for surgery, with the diagnosis of BILATERAL FEMORAL ARTERY OCCULSIVE DISEASE  The various methods of treatment have been discussed with the patient and family. After consideration of risks, benefits and other options for treatment, the patient has consented to  Procedure(s): ENDARTERECTOMY FEMORAL BILATERAL (Bilateral) as a surgical intervention .  The patient's history has been reviewed, patient examined, no change in status, stable for surgery.  I have reviewed the patient's chart and labs.  Questions were answered to the patient's satisfaction.     Curt Jews

## 2017-08-02 NOTE — Discharge Instructions (Signed)
°  August 02, 2017  Gina [name] [address] [place] [address]   Dear [name],   I hope you are doing well after your surgery. You mentioned in our pre-op discussion that you had a sore throat following surgery in the past. You may have had a sore throat following this surgery at [facility] as well. We, your anesthesia team, determined that you were a difficult intubation. This means that there was difficulty in placing a breathing tube from your mouth into your windpipe.  This procedure is commonly performed for general anesthetics for the purpose of providing oxygen and anesthetic gases during the operation.  Routinely, this is done using an instrument, known as a laryngoscope, to visualize the vocal cords and directly place the breathing tube into the trachea. We were unable to visualize your vocal cords with our standard laryngoscopes. We were able to pass the breathing tube into your windpipe using a VideoGlide scope, combined with a special stylette. You were asleep, very stable, and very safe during this time. However it is important for you to tell your Anesthesiologist when you have future operations, that you are a difficult intubation so that other arrangements, personnel, or instruments can be obtained to ensure your safety. I mentioned this to you in the recovery room, but you may not remember our discussion. Our technique is documented in our anesthetic record.  This record is available should others need to review it. If you have any questions about the procedure, please let me know and I will be happy to help.   We recommend that you obtain and wear a Medical Alert Bracelet in case an emergency arises.  There are many companies who will sell you such a bracelet in many styles.  They are easily found on the internet by searching "Medical Alert Bracelet."  Yours should have the words "Difficult Intubation" written on the bracelet.   It was a pleasure to take care of you for  your surgery, and I hope the best for you in the future. Should you have any questions, please feel free to contact me at [phone].   Annye Asa, MD  [facility]

## 2017-08-02 NOTE — Anesthesia Procedure Notes (Signed)
Procedure Name: Intubation Date/Time: 08/02/2017 12:24 PM Performed by: Annye Asa, MD Pre-anesthesia Checklist: Patient identified, Emergency Drugs available, Suction available and Patient being monitored Patient Re-evaluated:Patient Re-evaluated prior to induction Oxygen Delivery Method: Circle system utilized Preoxygenation: Pre-oxygenation with 100% oxygen Induction Type: IV induction Ventilation: Mask ventilation without difficulty Laryngoscope Size: Mac, 3 and 4 (Grade 4 with DL, cords seen with VideoGlide) Grade View: Grade IV Tube type: Oral Tube size: 7.5 mm Number of attempts: 3 Airway Equipment and Method: Stylet and Video-laryngoscopy Placement Confirmation: ETT inserted through vocal cords under direct vision,  positive ETCO2 and breath sounds checked- equal and bilateral Secured at: 22 cm Tube secured with: Tape Dental Injury: Teeth and Oropharynx as per pre-operative assessment and Injury to lip  Difficulty Due To: Difficulty was unanticipated, Difficult Airway- due to anterior larynx and Difficult Airway- due to dentition Comments: Will send difficult airway letter

## 2017-08-03 ENCOUNTER — Inpatient Hospital Stay (HOSPITAL_COMMUNITY): Payer: Medicare Other

## 2017-08-03 ENCOUNTER — Encounter (HOSPITAL_COMMUNITY): Payer: Self-pay | Admitting: General Practice

## 2017-08-03 ENCOUNTER — Other Ambulatory Visit: Payer: Self-pay

## 2017-08-03 DIAGNOSIS — Z9889 Other specified postprocedural states: Secondary | ICD-10-CM

## 2017-08-03 LAB — BASIC METABOLIC PANEL
Anion gap: 8 (ref 5–15)
BUN: 13 mg/dL (ref 6–20)
CO2: 25 mmol/L (ref 22–32)
Calcium: 8.6 mg/dL — ABNORMAL LOW (ref 8.9–10.3)
Chloride: 105 mmol/L (ref 101–111)
Creatinine, Ser: 0.67 mg/dL (ref 0.44–1.00)
GFR calc Af Amer: >60 mL/min (ref >60)
GFR calc non Af Amer: >60 mL/min (ref >60)
Glucose, Bld: 142 mg/dL — ABNORMAL HIGH (ref 65–99)
Potassium: 4.2 mmol/L (ref 3.5–5.1)
Sodium: 138 mmol/L (ref 135–145)

## 2017-08-03 LAB — CBC
HCT: 38.1 % (ref 36.0–46.0)
Hemoglobin: 12.1 g/dL (ref 12.0–15.0)
MCH: 33.5 pg (ref 26.0–34.0)
MCHC: 31.8 g/dL (ref 30.0–36.0)
MCV: 105.5 fL — ABNORMAL HIGH (ref 78.0–100.0)
Platelets: 137 10*3/uL — ABNORMAL LOW (ref 150–400)
RBC: 3.61 MIL/uL — ABNORMAL LOW (ref 3.87–5.11)
RDW: 14.3 % (ref 11.5–15.5)
WBC: 7.2 10*3/uL (ref 4.0–10.5)

## 2017-08-03 NOTE — Evaluation (Signed)
Physical Therapy Evaluation Patient Details Name: Gina [name] MRN: [id number] DOB: 01/22/1932 Today's Date: 08/03/2017   History of Present Illness  Pt is an 84 y.o. female s/p bilateral femoral endarterectomy.  Clinical Impression  Pt admitted with above diagnosis. Pt currently with functional limitations due to the deficits listed below (see PT Problem List). On eval, pt required supervision transfers and ambulation 200 feet with RW. At baseline she ambulates with SPC. She has 2 steps to enter her house. Pt will benefit from skilled PT to increase their independence and safety with mobility to allow discharge to the venue listed below.  PT to follow acutely. No follow up services indicated.     Follow Up Recommendations No PT follow up;Supervision - Intermittent    Equipment Recommendations  None recommended by PT    Recommendations for Other Services       Precautions / Restrictions Precautions Precautions: None      Mobility  Bed Mobility Overal bed mobility: Modified Independent             General bed mobility comments: +rail  Transfers Overall transfer level: Needs assistance Equipment used: Rolling walker (2 wheeled) Transfers: Sit to/from Stand Sit to Stand: Supervision         General transfer comment: supervision for safety  Ambulation/Gait Ambulation/Gait assistance: Supervision Gait Distance (Feet): 200 Feet Assistive device: Rolling walker (2 wheeled) Gait Pattern/deviations: Step-through pattern;Decreased stride length Gait velocity: decreased Gait velocity interpretation: 1.31 - 2.62 ft/sec, indicative of limited community ambulator General Gait Details: steady Development worker, international aid    Modified Rankin (Stroke Patients Only)       Balance Overall balance assessment: Mild deficits observed, not formally tested                                           Pertinent Vitals/Pain Pain  Assessment: No/denies pain    Home Living Family/patient expects to be discharged to:: Private residence Living Arrangements: Alone Available Help at Discharge: Family;Friend(s);Available PRN/intermittently;Neighbor Type of Home: House Home Access: Stairs to enter Entrance Stairs-Rails: Left Entrance Stairs-Number of Steps: 2 Home Layout: Able to live on main level with bedroom/bathroom;Two level Home Equipment: Walker - 2 wheels;Cane - single point;Shower seat - built in      Prior Function Level of Independence: Independent with assistive device(s)         Comments: uses RW during the night to go to the bathroom. Uses cane in the community.      Hand Dominance        Extremity/Trunk Assessment   Upper Extremity Assessment Upper Extremity Assessment: Defer to OT evaluation    Lower Extremity Assessment Lower Extremity Assessment: Overall WFL for tasks assessed    Cervical / Trunk Assessment Cervical / Trunk Assessment: Normal  Communication   Communication: No difficulties  Cognition Arousal/Alertness: Awake/alert Behavior During Therapy: WFL for tasks assessed/performed Overall Cognitive Status: Within Functional Limits for tasks assessed                                        General Comments      Exercises     Assessment/Plan    PT Assessment Patient needs continued PT services  PT Problem  List Decreased mobility;Decreased activity tolerance       PT Treatment Interventions Therapeutic activities;Gait training;Therapeutic exercise;Patient/family education;Stair training;Balance training;Functional mobility training    PT Goals (Current goals can be found in the Care Plan section)  Acute Rehab PT Goals Patient Stated Goal: home PT Goal Formulation: With patient Time For Goal Achievement: 08/17/17 Potential to Achieve Goals: Good    Frequency Min 3X/week   Barriers to discharge        Co-evaluation                AM-PAC PT "6 Clicks" Daily Activity  Outcome Measure Difficulty turning over in bed (including adjusting bedclothes, sheets and blankets)?: None Difficulty moving from lying on back to sitting on the side of the bed? : None Difficulty sitting down on and standing up from a chair with arms (e.g., wheelchair, bedside commode, etc,.)?: None Help needed moving to and from a bed to chair (including a wheelchair)?: None Help needed walking in hospital room?: None Help needed climbing 3-5 steps with a railing? : A Little 6 Click Score: 23    End of Session Equipment Utilized During Treatment: Gait belt Activity Tolerance: Patient tolerated treatment well Patient left: in bed;with call bell/phone within reach Nurse Communication: Mobility status PT Visit Diagnosis: Difficulty in walking, not elsewhere classified (R26.2)    Time: 4114-6431 PT Time Calculation (min) (ACUTE ONLY): 19 min   Charges:   PT Evaluation $PT Eval Low Complexity: 1 Low     PT G Codes:        Lorrin Goodell, PT  Office # [phone] Pager [phone]   Lorriane Shire 08/03/2017, 11:40 AM

## 2017-08-03 NOTE — Progress Notes (Signed)
VASCULAR LAB PRELIMINARY  ARTERIAL  ABI completed:  RIGHT    LEFT    PRESSURE WAVEFORM  PRESSURE WAVEFORM  BRACHIAL 134 Triphasic BRACHIAL 128 Triphasic  DP 64 Monophasic DP 56 Monophasic  AT   AT    PT 67 Monophasic PT 63 Monophasic  PER   PER    GREAT TOE  NA GREAT TOE  NA    RIGHT LEFT  ABI 0.50 0.47   The right ABI is suggestive of moderate arterial insufficiency at rest, the left ABI is suggestive of severe arterial insufficiency at rest.  08/03/2017 4:42 PM Maudry Mayhew, BS, RVT, RDCS, RDMS

## 2017-08-03 NOTE — Evaluation (Signed)
Occupational Therapy Evaluation Patient Details Name: Gina [name] MRN: [id number] DOB: 1931-05-21 Today's Date: 08/03/2017    History of Present Illness Pt is an 84 y.o. female s/p bilateral femoral endarterectomy.   Clinical Impression   Pt is at sup level for safety with ADLs/selfcare and mobility. Pt has all necessary DME and assist prn at homw. All education completed and no further acute OT indicated at this time    Follow Up Recommendations  No OT follow up    Equipment Recommendations  None recommended by OT    Recommendations for Other Services       Precautions / Restrictions Precautions Precautions: None Restrictions Weight Bearing Restrictions: No      Mobility Bed Mobility Overal bed mobility: Modified Independent             General bed mobility comments: +rail  Transfers Overall transfer level: Needs assistance Equipment used: Rolling walker (2 wheeled) Transfers: Sit to/from Stand Sit to Stand: Supervision         General transfer comment: supervision for safety    Balance Overall balance assessment: Mild deficits observed, not formally tested                                         ADL either performed or assessed with clinical judgement   ADL                                         General ADL Comments: sup for safety with LB ADLs and selfcare     Vision Baseline Vision/History: Wears glasses Wears Glasses: Reading only Patient Visual Report: No change from baseline       Perception     Praxis      Pertinent Vitals/Pain Pain Assessment: No/denies pain     Hand Dominance Right   Extremity/Trunk Assessment Upper Extremity Assessment Upper Extremity Assessment: Overall WFL for tasks assessed   Lower Extremity Assessment Lower Extremity Assessment: Defer to PT evaluation   Cervical / Trunk Assessment Cervical / Trunk Assessment: Normal   Communication  Communication Communication: No difficulties   Cognition Arousal/Alertness: Awake/alert Behavior During Therapy: WFL for tasks assessed/performed Overall Cognitive Status: Within Functional Limits for tasks assessed                                     General Comments       Exercises     Shoulder Instructions      Home Living Family/patient expects to be discharged to:: Private residence Living Arrangements: Alone Available Help at Discharge: Family;Friend(s);Available PRN/intermittently;Neighbor Type of Home: House Home Access: Stairs to enter [employer] of Steps: 2 Entrance Stairs-Rails: Left Home Layout: Able to live on main level with bedroom/bathroom;Two level     Bathroom Shower/Tub: Occupational psychologist: Handicapped height Bathroom Accessibility: Yes   Home Equipment: Environmental consultant - 2 wheels;Cane - single point;Shower seat - built in          Prior Functioning/Environment Level of Independence: Independent with assistive device(s)        Comments: uses RW during the night to go to the bathroom. Uses cane in the community.         OT Problem  List: Decreased activity tolerance      OT Treatment/Interventions:      OT Goals(Current goals can be found in the care plan section) Acute Rehab OT Goals Patient Stated Goal: go home OT Goal Formulation: With patient  OT Frequency:     Barriers to D/C:    no barriers       Co-evaluation              AM-PAC PT "6 Clicks" Daily Activity     Outcome Measure Help from another person eating meals?: None Help from another person taking care of personal grooming?: None Help from another person toileting, which includes using toliet, bedpan, or urinal?: None Help from another person bathing (including washing, rinsing, drying)?: None Help from another person to put on and taking off regular upper body clothing?: None Help from another person to put on and taking off  regular lower body clothing?: None 6 Click Score: 24   End of Session Equipment Utilized During Treatment: Rolling walker  Activity Tolerance: Patient tolerated treatment well Patient left: in bed;with call bell/phone within reach  OT Visit Diagnosis: Other abnormalities of gait and mobility (R26.89)                Time: 1191-4782 OT Time Calculation (min): 29 min Charges:  OT General Charges $OT Visit: 1 Visit OT Evaluation $OT Eval Low Complexity: 1 Low OT Treatments $Therapeutic Activity: 8-22 mins G-Codes: OT G-codes **NOT FOR INPATIENT CLASS** Functional Assessment Tool Used: AM-PAC 6 Clicks Daily Activity     Britt Bottom 08/03/2017, 1:12 PM

## 2017-08-03 NOTE — Progress Notes (Addendum)
[facility] [place]  Subjective  - Doing OK over all.     Objective 128/61 88 98.2 F (36.8 C) 12 92%  Intake/Output Summary (Last 24 hours) at 08/03/2017 0741 Last data filed at 08/03/2017 0418 Gross per 24 hour  Intake 1828.82 ml  Output 2175 ml  Net -346.18 ml    Groin soft healing well Doppler signals Left brisk > right DP/PT Heart RRR Lungs non labored breathing Gen NAD  Assessment/Planning: POD # 1 Bilateral femoral endarterectomy and Dacron patch angioplasty  Patent femoral arteries with improved arterial flow B LE. Foley out this am pending voiding independently PT eval and treat for mobility Plan discharge likely tomorrow pending pain control and mobility.  She lives alone.  Gina [name] 08/03/2017 7:41 AM --  Laboratory Lab Results: Recent Labs    08/02/17 1925 08/03/17 0412  WBC 9.5 7.2  HGB 12.8 12.1  HCT 40.2 38.1  PLT PLATELET CLUMPS NOTED ON SMEAR, COUNT APPEARS DECREASED 137*   BMET Recent Labs    08/02/17 1925 08/03/17 0412  NA  --  138  K  --  4.2  CL  --  105  CO2  --  25  GLUCOSE  --  142*  BUN  --  13  CREATININE 0.68 0.67  CALCIUM  --  8.6*    COAG Lab Results  Component Value Date   INR 0.96 07/24/2017   INR 0.94 06/07/2010   INR 0.93 11/12/2009   No results found for: PTT  I have examined the patient, reviewed and agree with above.  Curt Jews, MD 08/03/2017 8:10 AM

## 2017-08-04 MED ORDER — OXYCODONE-ACETAMINOPHEN 5-325 MG PO TABS: 1.0000 | ORAL_TABLET | Freq: Four times a day (QID) | ORAL | 0 refills | Status: DC | PRN

## 2017-08-04 NOTE — Consult Note (Signed)
[facility] CM Primary Care Navigator  08/04/2017  Gina [name] December 14, 1931 [id number]   Met withpatient at the bedside toidentify possible discharge needs. Patientreportsbeing "unable to walk distances due to pain and bad circulation on both legs" which had ledto thisadmission/ surgery. (Bilateral lower extremity arterial insufficiency underwent bilateral femoral ENDARTERECTOMY with Patch Angioplasty)  Patientendorses Dr. Lew Dawes with [place] [facility] provider.   [facility] [employer] and [employer] order service to obtainmedications without difficulty.  Patientreports thatshehas beenmanagingherownmedications at [employer] use of "pill box" system filled every 4 weeks.  Patient verbalized using [employer]life [employer])" for transportation due to macular degeneration (no longer drives). Her granddaughter Gina [name]) with husband Gina [name]) and her good friends will be able to provide transportation to herdoctors' appointments when needed.  Patientstates that she lives alone, independent with self care and caregiver for her own self. She mentioned that her neighbors, son Gina [name]), granddaughter and husband will be able to provide assistance if needed after discharge.  Anticipated plan for dischargeis home per patient.  Patientvoiced understandingto callprimarycare provider'soffice whenshereturnshome,for a post discharge follow-upvisitwithin1- 2 weeksor sooner if needs arise.Patient letter (with PCP's contact number) was provided asareminder.   Discussed with patientregarding [facility] CM services available for health managementandresourcesat homebutshe deniesany needs or concerns at this time.Patientverbalizedunderstandingof need to seekreferral from primary care provider to [facility] care management ifdeemed necessary and appropriatefor anyservicesin  thefuture.  [facility] care management information was provided for futureneedsthat shemay have.  Patient hadverbally agreedand optedforEMMIcalls tofollow-up withherrecoveryat home.   Referral made for [facility] General calls after discharge.   For additional questions please contact:  Edwena Felty A. Fajr Fife, BSN, RN-BC [facility] PRIMARY CARE Navigator Cell: [phone]

## 2017-08-04 NOTE — Progress Notes (Signed)
Physical Therapy Treatment Patient Details Name: Gina [name] MRN: [id number] DOB: [birth year] Today's Date: 08/04/2017    History of Present Illness Pt is an 84 y.o. female s/p bilateral femoral endarterectomy.    PT Comments    Patient ambulating without AD this visit, focused on stiar training. Pt initially unsafe on stairs, provided education and cueing for new technique utilizing BUE support on railings and pt able to perform safely with supervision. Educated pt and daughter on proper incentive spirometer usage as she de satted to 85% on RA. Educated on energy conservation. No concerns for return home today from PT standpoint.    Follow Up Recommendations  No PT follow up;Supervision - Intermittent     Equipment Recommendations  None recommended by PT    Recommendations for Other Services       Precautions / Restrictions Precautions Precautions: None Restrictions Weight Bearing Restrictions: No    Mobility  Bed Mobility Overal bed mobility: Modified Independent                Transfers Overall transfer level: Modified independent                  Ambulation/Gait Ambulation/Gait assistance: Modified independent (Device/Increase time) Gait Distance (Feet): 100 Feet Assistive device: Rolling walker (2 wheeled) Gait Pattern/deviations: Step-through pattern;Decreased stride length Gait velocity: decreased   General Gait Details: steady gait   Stairs Stairs: Yes Stairs assistance: Min guard;Supervision Stair Management: One rail Left;Sideways;Step to pattern Number of Stairs: 12 General stair comments: educated pt and daugther on safe ambulation on stairs, utilizing BUE support. pt initially attempting 1 UE support not steady or safe.    Wheelchair Mobility    Modified Rankin (Stroke Patients Only)       Balance Overall balance assessment: Mild deficits observed, not formally tested                                          Cognition Arousal/Alertness: Awake/alert Behavior During Therapy: WFL for tasks assessed/performed Overall Cognitive Status: Within Functional Limits for tasks assessed                                        Exercises      General Comments        Pertinent Vitals/Pain Pain Assessment: No/denies pain    Home Living                      Prior Function            PT Goals (current goals can now be found in the care plan section) Acute Rehab PT Goals Patient Stated Goal: go home PT Goal Formulation: With patient Time For Goal Achievement: 08/17/17 Potential to Achieve Goals: Good Progress towards PT goals: Progressing toward goals    Frequency    Min 3X/week      PT Plan Current plan remains appropriate    Co-evaluation              AM-PAC PT "6 Clicks" Daily Activity  Outcome Measure  Difficulty turning over in bed (including adjusting bedclothes, sheets and blankets)?: None Difficulty moving from lying on back to sitting on the side of the bed? : None Difficulty sitting down on and standing up from a chair  with arms (e.g., wheelchair, bedside commode, etc,.)?: None Help needed moving to and from a bed to chair (including a wheelchair)?: None Help needed walking in hospital room?: None Help needed climbing 3-5 steps with a railing? : A Little 6 Click Score: 23    End of Session Equipment Utilized During Treatment: Gait belt Activity Tolerance: Patient tolerated treatment well Patient left: in bed;with call bell/phone within reach Nurse Communication: Mobility status PT Visit Diagnosis: Difficulty in walking, not elsewhere classified (R26.2)     Time: 1300-1315 PT Time Calculation (min) (ACUTE ONLY): 15 min  Charges:  $Gait Training: 8-22 mins                    G Codes:       Reinaldo Berber, PT, DPT Acute Rehab Services Pager: [phone]     Reinaldo Berber 08/04/2017, 1:34 PM

## 2017-08-04 NOTE — Progress Notes (Addendum)
[facility] [place]  Subjective  - Doing much better.     Objective (!) 130/51 88 98.7 F (37.1 C) (Oral) 15 95%  Intake/Output Summary (Last 24 hours) at 08/04/2017 0734 Last data filed at 08/03/2017 1700 Gross per 24 hour  Intake 600 ml  Output -  Net 600 ml    Groins soft without hematoma.  Superficial incisional drainage.  Dry guaze placed over groins. Ambulating well with rolling walker in room Feet warm B well perfused Heart RRR Lungs non labored breathing Gen NAD  ABI's 08/03/2017 R 0.50 L 0.47  Assessment/Planning: POD # 2 Bilateral femoral endarterectomy and Dacron patch angioplasty  Patent B femoral arteries with improved ABI's, superficial incisional drainage will send home dry guaze, patient instructed to keep groin clean and dry. F/U with DR. Cleotha Whalin 2-3 weeks    Roxy Horseman 08/04/2017 7:34 AM --  Laboratory Lab Results: Recent Labs    08/02/17 1925 08/03/17 0412  WBC 9.5 7.2  HGB 12.8 12.1  HCT 40.2 38.1  PLT PLATELET CLUMPS NOTED ON SMEAR, COUNT APPEARS DECREASED 137*   BMET Recent Labs    08/02/17 1925 08/03/17 0412  NA  --  138  K  --  4.2  CL  --  105  CO2  --  25  GLUCOSE  --  142*  BUN  --  13  CREATININE 0.68 0.67  CALCIUM  --  8.6*    COAG Lab Results  Component Value Date   INR 0.96 07/24/2017   INR 0.94 06/07/2010   INR 0.93 11/12/2009   No results found for: PTT

## 2017-08-05 DIAGNOSIS — I739 Peripheral vascular disease, unspecified: Secondary | ICD-10-CM | POA: Diagnosis not present

## 2017-08-05 DIAGNOSIS — M8580 Other specified disorders of bone density and structure, unspecified site: Secondary | ICD-10-CM | POA: Diagnosis not present

## 2017-08-05 DIAGNOSIS — I10 Essential (primary) hypertension: Secondary | ICD-10-CM | POA: Diagnosis not present

## 2017-08-05 DIAGNOSIS — Z48812 Encounter for surgical aftercare following surgery on the circulatory system: Secondary | ICD-10-CM | POA: Diagnosis not present

## 2017-08-06 DIAGNOSIS — I739 Peripheral vascular disease, unspecified: Secondary | ICD-10-CM | POA: Diagnosis not present

## 2017-08-06 DIAGNOSIS — M8580 Other specified disorders of bone density and structure, unspecified site: Secondary | ICD-10-CM | POA: Diagnosis not present

## 2017-08-06 DIAGNOSIS — I10 Essential (primary) hypertension: Secondary | ICD-10-CM | POA: Diagnosis not present

## 2017-08-06 DIAGNOSIS — Z48812 Encounter for surgical aftercare following surgery on the circulatory system: Secondary | ICD-10-CM | POA: Diagnosis not present

## 2017-08-07 DIAGNOSIS — I10 Essential (primary) hypertension: Secondary | ICD-10-CM | POA: Diagnosis not present

## 2017-08-07 DIAGNOSIS — M8580 Other specified disorders of bone density and structure, unspecified site: Secondary | ICD-10-CM | POA: Diagnosis not present

## 2017-08-07 DIAGNOSIS — Z48812 Encounter for surgical aftercare following surgery on the circulatory system: Secondary | ICD-10-CM | POA: Diagnosis not present

## 2017-08-07 DIAGNOSIS — I739 Peripheral vascular disease, unspecified: Secondary | ICD-10-CM | POA: Diagnosis not present

## 2017-08-09 DIAGNOSIS — Z48812 Encounter for surgical aftercare following surgery on the circulatory system: Secondary | ICD-10-CM | POA: Diagnosis not present

## 2017-08-09 DIAGNOSIS — M8580 Other specified disorders of bone density and structure, unspecified site: Secondary | ICD-10-CM | POA: Diagnosis not present

## 2017-08-09 DIAGNOSIS — I10 Essential (primary) hypertension: Secondary | ICD-10-CM | POA: Diagnosis not present

## 2017-08-09 DIAGNOSIS — I739 Peripheral vascular disease, unspecified: Secondary | ICD-10-CM | POA: Diagnosis not present

## 2017-08-10 ENCOUNTER — Telehealth: Payer: Self-pay

## 2017-08-10 NOTE — Telephone Encounter (Signed)
Please advise  Copied from [place] [id number]. Topic: General - Other >> Aug 07, 2017 11:05 AM Oneta Rack wrote:  Relation to pt: self  Call back number: [phone]   Reason for call:  Patient was advised by (surgeon) Dr. Hedy Camara to follow up with PCP regarding post surgery, patient states surgeon was please with the outcome. Patient had surgery on arities on both legs. Patient has home health care visits assessing incison and vitals. Patient states she's following up with surgeon next week and has a 6 month follow up scheduled with PCP for 09/04/17, patient would like to know if its necessary to follow up sooner, patient states she is doing fine, please advise

## 2017-08-10 NOTE — Telephone Encounter (Signed)
I can see the patient on 09/04/2017 if she is doing well.  Thank you

## 2017-08-11 DIAGNOSIS — M8580 Other specified disorders of bone density and structure, unspecified site: Secondary | ICD-10-CM | POA: Diagnosis not present

## 2017-08-11 DIAGNOSIS — I739 Peripheral vascular disease, unspecified: Secondary | ICD-10-CM | POA: Diagnosis not present

## 2017-08-11 DIAGNOSIS — Z48812 Encounter for surgical aftercare following surgery on the circulatory system: Secondary | ICD-10-CM | POA: Diagnosis not present

## 2017-08-11 DIAGNOSIS — I10 Essential (primary) hypertension: Secondary | ICD-10-CM | POA: Diagnosis not present

## 2017-08-11 NOTE — Telephone Encounter (Signed)
Pt.notified

## 2017-08-14 DIAGNOSIS — M8580 Other specified disorders of bone density and structure, unspecified site: Secondary | ICD-10-CM | POA: Diagnosis not present

## 2017-08-14 DIAGNOSIS — I739 Peripheral vascular disease, unspecified: Secondary | ICD-10-CM | POA: Diagnosis not present

## 2017-08-14 DIAGNOSIS — Z48812 Encounter for surgical aftercare following surgery on the circulatory system: Secondary | ICD-10-CM | POA: Diagnosis not present

## 2017-08-14 DIAGNOSIS — I10 Essential (primary) hypertension: Secondary | ICD-10-CM | POA: Diagnosis not present

## 2017-08-14 NOTE — Discharge Summary (Signed)
[facility] Discharge Summary   Patient ID:  Gina [name] MRN: [id number] DOB/AGE: 1931/05/23 84 y.o.  Admit date: 08/02/2017 Discharge date: 08/04/2017 Date of Surgery: 08/02/2017 Surgeon: Surgeon(s): Early, Arvilla Meres, MD Elam Dutch, MD  Admission Diagnosis: BILATERAL FEMORAL ARTERY OCCULSIVE DISEASE  Discharge Diagnoses:  BILATERAL FEMORAL ARTERY OCCULSIVE DISEASE  Secondary Diagnoses: Past Medical History:  Diagnosis Date  . Allergic rhinitis    seasonal  . Arthritis   . Colon cancer [facility]) Dec 12  . Diverticulosis of colon (without mention of hemorrhage)   . GI bleed    had cyst removed from spine....went back on plavix to soon  . Gout   . Hyperlipidemia   . Hyperplastic colon polyp   . Hypertension   . Macular degeneration   . PAD (peripheral artery disease) ([place])   . Pneumonia    back in 05/2017  . Stroke [facility])    TIA  10/2009  . Unspecified transient cerebral ischemia sept '11    Procedure(s): Bilateral Femoral ENDARTERECTOMY with Patch Angioplasty  Discharged Condition: good  HPI: Gina [name] is a 84 y.o. female seen in our office secondary to  severe limiting lower extremity claudication.   She underwent outpatient arteriogram which revealed moderate stenosis of her proximal left common iliac artery which was corrected with angioplasty and stenting by Dr. Scot Dock.  She had complete occlusion of her common femoral artery on the right and subtotal occlusion of her common femoral artery on the left.  She has patency of her superficial femoral, popliteal and tibial vessels bilaterally.  He has severe claudication symptoms bilaterally right greater than left and early rest pain on the right  She has life limiting claudication and wishes to proceed with surgery.        Hospital Course:  Gina [name] is a 84 y.o. female is S/P  Procedure(s): Bilateral Femoral ENDARTERECTOMY with Patch Angioplasty Post op she was having pain  control issue that interfered with her mobility.  By post op day 2 she had better pain control with patent arterial flow bilateral LE.  No home health follow up recommended.   ABI's 08/03/2017 R 0.50 L 0.47  Disposition stable for discharge with a f/u visit in 2-3 weeks with Dr. Donnetta Hutching.     Significant Diagnostic Studies: CBC Lab Results  Component Value Date   WBC 7.2 08/03/2017   HGB 12.1 08/03/2017   HCT 38.1 08/03/2017   MCV 105.5 (H) 08/03/2017   PLT 137 (L) 08/03/2017    BMET    Component Value Date/Time   NA 138 08/03/2017 0412   NA 141 05/06/2016 0944   K 4.2 08/03/2017 0412   K 3.5 05/06/2016 0944   CL 105 08/03/2017 0412   CL 101 06/20/2012 0922   CO2 25 08/03/2017 0412   CO2 31 (H) 05/06/2016 0944   GLUCOSE 142 (H) 08/03/2017 0412   GLUCOSE 98 05/06/2016 0944   GLUCOSE 120 (H) 06/20/2012 0922   BUN 13 08/03/2017 0412   BUN 17.8 05/06/2016 0944   CREATININE 0.67 08/03/2017 0412   CREATININE 0.7 05/06/2016 0944   CALCIUM 8.6 (L) 08/03/2017 0412   CALCIUM 9.8 05/06/2016 0944   GFRNONAA >60 08/03/2017 0412   GFRAA >60 08/03/2017 0412   COAG Lab Results  Component Value Date   INR 0.96 07/24/2017   INR 0.94 06/07/2010   INR 0.93 11/12/2009     Disposition:  Discharge to :Home Discharge Instructions    Call MD for:  redness, tenderness, or signs of infection (pain, swelling, bleeding, redness, odor or green/yellow discharge around incision site)   Complete by:  As directed    Call MD for:  severe or increased pain, loss or decreased feeling  in affected limb(s)   Complete by:  As directed    Call MD for:  temperature >100.5   Complete by:  As directed    Discharge instructions   Complete by:  As directed    Keep groins clean and dry between showers.  You may shower daily PRN.  Gradually increase your daily activity as you tolerate.   Resume previous diet   Complete by:  As directed      Allergies as of 08/04/2017      Reactions   Penicillins  Hives   Has patient had a PCN reaction causing immediate rash, facial/tongue/throat swelling, SOB or lightheadedness with hypotension:## yes ## Has patient had a PCN reaction causing severe rash involving mucus membranes or skin necrosis: no Has patient had a PCN reaction that required hospitalization: no Has patient had a PCN reaction occurring within the last 10 years: no If all of the above answers are "NO", then may proceed with Cephalosporin use.   Aspirin Hives   Adhesive [tape] Other (See Comments)   "pulls skin off"      Medication List    TAKE these medications   acetaminophen 500 MG tablet Commonly known as:  TYLENOL Take 500-1,000 mg by mouth 3 (three) times daily as needed (for pain.).   albuterol 108 (90 Base) MCG/ACT inhaler Commonly known as:  PROVENTIL HFA;VENTOLIN HFA Inhale 2 puffs into the lungs every 6 (six) hours as needed for wheezing or shortness of breath.   allopurinol 100 MG tablet Commonly known as:  ZYLOPRIM Take 1 tablet (100 mg total) by mouth daily.   amLODipine 5 MG tablet Commonly known as:  NORVASC Take 1 tablet (5 mg total) by mouth daily.   atorvastatin 40 MG tablet Commonly known as:  LIPITOR Take 1 tablet (40 mg total) by mouth daily. What changed:  when to take this   CALCIUM 600 PO Take 600 mg by mouth 2 (two) times daily.   cetirizine 10 MG tablet Commonly known as:  ZYRTEC Take 10 mg by mouth daily.   clopidogrel 75 MG tablet Commonly known as:  PLAVIX Take 1 tablet (75 mg total) by mouth daily.   enalapril 10 MG tablet Commonly known as:  VASOTEC Take 1 tablet (10 mg total) by mouth daily.   guaiFENesin-codeine 100-10 MG/5ML syrup Take 5 mLs by mouth every 6 (six) hours as needed for cough.   multivitamin with minerals Tabs tablet Take 1 tablet by mouth daily.   oxyCODONE-acetaminophen 5-325 MG tablet Commonly known as:  PERCOCET/ROXICET Take 1 tablet by mouth every 6 (six) hours as needed for moderate pain.    PRESERVISION AREDS 2 PO Take 1 tablet by mouth daily.   triamterene-hydrochlorothiazide 37.5-25 MG tablet Commonly known as:  MAXZIDE-25 Take 1 tablet by mouth daily.   valACYclovir 1000 MG tablet Commonly known as:  VALTREX Take 1 tablet (1,000 mg total) by mouth 3 (three) times daily.   vitamin C 500 MG tablet Commonly known as:  ASCORBIC ACID Take 500 mg by mouth every evening.      Verbal and written Discharge instructions given to the patient. Wound care per Discharge AVS Follow-up Information    Early, Arvilla Meres, MD Follow up in 2 week(s).   Specialties:  Vascular  Surgery, Cardiology Why:  office will call Contact information: [address] [place] [address] [phone]           Signed: Roxy Horseman 08/14/2017, 2:47 PM

## 2017-08-16 DIAGNOSIS — I739 Peripheral vascular disease, unspecified: Secondary | ICD-10-CM | POA: Diagnosis not present

## 2017-08-16 DIAGNOSIS — Z48812 Encounter for surgical aftercare following surgery on the circulatory system: Secondary | ICD-10-CM | POA: Diagnosis not present

## 2017-08-16 DIAGNOSIS — M8580 Other specified disorders of bone density and structure, unspecified site: Secondary | ICD-10-CM | POA: Diagnosis not present

## 2017-08-16 DIAGNOSIS — I10 Essential (primary) hypertension: Secondary | ICD-10-CM | POA: Diagnosis not present

## 2017-08-21 DIAGNOSIS — I739 Peripheral vascular disease, unspecified: Secondary | ICD-10-CM | POA: Diagnosis not present

## 2017-08-21 DIAGNOSIS — I10 Essential (primary) hypertension: Secondary | ICD-10-CM | POA: Diagnosis not present

## 2017-08-21 DIAGNOSIS — M47816 Spondylosis without myelopathy or radiculopathy, lumbar region: Secondary | ICD-10-CM | POA: Diagnosis not present

## 2017-08-21 DIAGNOSIS — M8580 Other specified disorders of bone density and structure, unspecified site: Secondary | ICD-10-CM | POA: Diagnosis not present

## 2017-08-21 DIAGNOSIS — M4316 Spondylolisthesis, lumbar region: Secondary | ICD-10-CM | POA: Diagnosis not present

## 2017-08-21 DIAGNOSIS — M48062 Spinal stenosis, lumbar region with neurogenic claudication: Secondary | ICD-10-CM | POA: Diagnosis not present

## 2017-08-21 DIAGNOSIS — Z48812 Encounter for surgical aftercare following surgery on the circulatory system: Secondary | ICD-10-CM | POA: Diagnosis not present

## 2017-08-21 DIAGNOSIS — M5416 Radiculopathy, lumbar region: Secondary | ICD-10-CM | POA: Diagnosis not present

## 2017-08-21 DIAGNOSIS — Z6833 Body mass index (BMI) 33.0-33.9, adult: Secondary | ICD-10-CM | POA: Diagnosis not present

## 2017-08-21 DIAGNOSIS — M5136 Other intervertebral disc degeneration, lumbar region: Secondary | ICD-10-CM | POA: Diagnosis not present

## 2017-08-24 DIAGNOSIS — M8580 Other specified disorders of bone density and structure, unspecified site: Secondary | ICD-10-CM | POA: Diagnosis not present

## 2017-08-24 DIAGNOSIS — Z48812 Encounter for surgical aftercare following surgery on the circulatory system: Secondary | ICD-10-CM | POA: Diagnosis not present

## 2017-08-24 DIAGNOSIS — I739 Peripheral vascular disease, unspecified: Secondary | ICD-10-CM | POA: Diagnosis not present

## 2017-08-24 DIAGNOSIS — I10 Essential (primary) hypertension: Secondary | ICD-10-CM | POA: Diagnosis not present

## 2017-08-29 ENCOUNTER — Ambulatory Visit (INDEPENDENT_AMBULATORY_CARE_PROVIDER_SITE_OTHER): Payer: Self-pay | Admitting: Vascular Surgery

## 2017-08-29 ENCOUNTER — Other Ambulatory Visit: Payer: Self-pay

## 2017-08-29 ENCOUNTER — Encounter: Payer: Self-pay | Admitting: Vascular Surgery

## 2017-08-29 VITALS — BP 159/65 | HR 71 | Temp 97.5°F | Resp 18 | Ht 60.0 in | Wt 185.9 lb

## 2017-08-29 DIAGNOSIS — I739 Peripheral vascular disease, unspecified: Secondary | ICD-10-CM

## 2017-08-29 NOTE — Progress Notes (Signed)
Patient name: MECHEL HAGGARD MRN: [id number] DOB: May 30, 1931 Sex: female  REASON FOR VISIT: Aloe up lateral common femoral artery endarterectomies for limiting claudication on 08/02/2017  HPI: ILETA OFARRELL is a 84 y.o. female for follow-up.  She is doing well since her discharge from the hospital.  She reports that she Tylenol for the first several days for discomfort and nothing since.  She does report some mild lower extremity swelling.  She noted this was present before and occasionally did wear a thigh-high compression and has not worn her compression since the procedure.  I reassured her that this is a self-limiting due to the groin exposure.  He has not had any claudication symptoms but has not done a great deal of walking  Current Outpatient Medications  Medication Sig Dispense Refill  . acetaminophen (TYLENOL) 500 MG tablet Take 500-1,000 mg by mouth 3 (three) times daily as needed (for pain.).     [name] allopurinol (ZYLOPRIM) 100 MG tablet Take 1 tablet (100 mg total) by mouth daily. 90 tablet 3  . amLODipine (NORVASC) 5 MG tablet Take 1 tablet (5 mg total) by mouth daily. 90 tablet 3  . atorvastatin (LIPITOR) 40 MG tablet Take 1 tablet (40 mg total) by mouth daily. (Patient taking differently: Take 40 mg by mouth every evening. ) 90 tablet 3  . Calcium Carbonate (CALCIUM 600 PO) Take 600 mg by mouth 2 (two) times daily.     . cetirizine (ZYRTEC) 10 MG tablet Take 10 mg by mouth daily.    . clopidogrel (PLAVIX) 75 MG tablet Take 1 tablet (75 mg total) by mouth daily. 90 tablet 3  . enalapril (VASOTEC) 10 MG tablet Take 1 tablet (10 mg total) by mouth daily. 90 tablet 3  . Multiple Vitamin (MULTIVITAMIN WITH MINERALS) TABS tablet Take 1 tablet by mouth daily.    . Multiple Vitamins-Minerals (PRESERVISION AREDS 2 PO) Take 1 tablet by mouth daily.    [name] triamterene-hydrochlorothiazide (MAXZIDE-25) 37.5-25 MG tablet Take 1 tablet by mouth daily. 90 tablet  3  . vitamin C (ASCORBIC ACID) 500 MG tablet Take 500 mg by mouth every evening.    [name] albuterol (PROVENTIL HFA;VENTOLIN HFA) 108 (90 Base) MCG/ACT inhaler Inhale 2 puffs into the lungs every 6 (six) hours as needed for wheezing or shortness of breath. (Patient not taking: Reported on 07/04/2017) 1 Inhaler 1  . guaiFENesin-codeine 100-10 MG/5ML syrup Take 5 mLs by mouth every 6 (six) hours as needed for cough. (Patient not taking: Reported on 07/04/2017) 60 mL 0  . oxyCODONE-acetaminophen (PERCOCET/ROXICET) 5-325 MG tablet Take 1 tablet by mouth every 6 (six) hours as needed for moderate pain. (Patient not taking: Reported on 08/29/2017) 12 tablet 0  . valACYclovir (VALTREX) 1000 MG tablet Take 1 tablet (1,000 mg total) by mouth 3 (three) times daily. (Patient not taking: Reported on 07/04/2017) 21 tablet 0   No current facility-administered medications for this visit.      PHYSICAL EXAM: Vitals:   08/29/17 0836  BP: (!) 159/65  Pulse: 71  Resp: 18  Temp: (!) 97.5 F (36.4 C)  TempSrc: Oral  SpO2: 99%  Weight: 185 lb 14.4 oz (84.3 kg)  Height: 5' (1.524 m)    GENERAL: The patient is a well-nourished female, in no acute distress. The vital signs are documented above. Both groin incisions are well-healed with palpable pulses.  I do not palpate pedal pulses.  MEDICAL ISSUES: Table overall.  We will continue her walking program to increase.  She does feel somewhat unsteady general and has a fear of falling.  She will continue to increase her walking.  I will see her again in 2 to 3 months with ankle arm index at that time.  She will notify us should she develop any wound issues or ischemic problems   Rosetta Posner, MD [facility] Tel [phone] Pager [phone]

## 2017-08-30 ENCOUNTER — Other Ambulatory Visit: Payer: Self-pay

## 2017-08-30 DIAGNOSIS — I739 Peripheral vascular disease, unspecified: Secondary | ICD-10-CM

## 2017-09-04 ENCOUNTER — Encounter: Payer: Self-pay | Admitting: Internal Medicine

## 2017-09-04 ENCOUNTER — Ambulatory Visit (INDEPENDENT_AMBULATORY_CARE_PROVIDER_SITE_OTHER): Payer: Medicare Other | Admitting: Internal Medicine

## 2017-09-04 VITALS — BP 130/68 | HR 76 | Temp 98.6°F | Ht 60.0 in | Wt 183.0 lb

## 2017-09-04 DIAGNOSIS — R202 Paresthesia of skin: Secondary | ICD-10-CM | POA: Diagnosis not present

## 2017-09-04 DIAGNOSIS — R7309 Other abnormal glucose: Secondary | ICD-10-CM

## 2017-09-04 DIAGNOSIS — I10 Essential (primary) hypertension: Secondary | ICD-10-CM

## 2017-09-04 DIAGNOSIS — C189 Malignant neoplasm of colon, unspecified: Secondary | ICD-10-CM

## 2017-09-04 DIAGNOSIS — I739 Peripheral vascular disease, unspecified: Secondary | ICD-10-CM | POA: Diagnosis not present

## 2017-09-04 DIAGNOSIS — E785 Hyperlipidemia, unspecified: Secondary | ICD-10-CM | POA: Diagnosis not present

## 2017-09-04 MED ORDER — AMLODIPINE BESYLATE 5 MG PO TABS: 5.0000 mg | ORAL_TABLET | Freq: Every day | ORAL | 3 refills | Status: DC

## 2017-09-04 MED ORDER — ENALAPRIL MALEATE 10 MG PO TABS: 10.0000 mg | ORAL_TABLET | Freq: Every day | ORAL | 3 refills | Status: DC

## 2017-09-04 MED ORDER — ALLOPURINOL 100 MG PO TABS: 100.0000 mg | ORAL_TABLET | Freq: Every day | ORAL | 3 refills | Status: DC

## 2017-09-04 MED ORDER — CLOPIDOGREL BISULFATE 75 MG PO TABS: 75.0000 mg | ORAL_TABLET | Freq: Every day | ORAL | 3 refills | Status: DC

## 2017-09-04 MED ORDER — ATORVASTATIN CALCIUM 40 MG PO TABS: 40.0000 mg | ORAL_TABLET | Freq: Every evening | ORAL | 3 refills | Status: DC

## 2017-09-04 MED ORDER — TRIAMTERENE-HCTZ 37.5-25 MG PO TABS: 1.0000 | ORAL_TABLET | Freq: Every day | ORAL | 3 refills | Status: DC

## 2017-09-04 NOTE — Assessment & Plan Note (Signed)
Labs

## 2017-09-04 NOTE — Assessment & Plan Note (Signed)
  Norvasc, Enalapril, Triamt/HCTZ

## 2017-09-04 NOTE — Assessment & Plan Note (Signed)
Lipitor 

## 2017-09-04 NOTE — Progress Notes (Signed)
Subjective:  Patient ID: Gina [name], female    DOB: 28-Sep-1931  Age: 84 y.o. MRN: [id number]  CC: No chief complaint on file.   HPI Gina [name] presents for PVD, HTN, OA f/u Vasc surg surgery 08/02/17 B - Dr Early  Outpatient Medications Prior to Visit  Medication Sig Dispense Refill  . acetaminophen (TYLENOL) 500 MG tablet Take 500-1,000 mg by mouth 3 (three) times daily as needed (for pain.).     [name] Calcium Carbonate (CALCIUM 600 PO) Take 600 mg by mouth 2 (two) times daily.     . cetirizine (ZYRTEC) 10 MG tablet Take 10 mg by mouth daily.    . Multiple Vitamin (MULTIVITAMIN WITH MINERALS) TABS tablet Take 1 tablet by mouth daily.    . Multiple Vitamins-Minerals (PRESERVISION AREDS 2 PO) Take 1 tablet by mouth daily.    . vitamin C (ASCORBIC ACID) 500 MG tablet Take 500 mg by mouth every evening.    [name] allopurinol (ZYLOPRIM) 100 MG tablet Take 1 tablet (100 mg total) by mouth daily. 90 tablet 3  . amLODipine (NORVASC) 5 MG tablet Take 1 tablet (5 mg total) by mouth daily. 90 tablet 3  . atorvastatin (LIPITOR) 40 MG tablet Take 1 tablet (40 mg total) by mouth daily. (Patient taking differently: Take 40 mg by mouth every evening. ) 90 tablet 3  . clopidogrel (PLAVIX) 75 MG tablet Take 1 tablet (75 mg total) by mouth daily. 90 tablet 3  . enalapril (VASOTEC) 10 MG tablet Take 1 tablet (10 mg total) by mouth daily. 90 tablet 3  . triamterene-hydrochlorothiazide (MAXZIDE-25) 37.5-25 MG tablet Take 1 tablet by mouth daily. 90 tablet 3  . albuterol (PROVENTIL HFA;VENTOLIN HFA) 108 (90 Base) MCG/ACT inhaler Inhale 2 puffs into the lungs every 6 (six) hours as needed for wheezing or shortness of breath. (Patient not taking: Reported on 07/04/2017) 1 Inhaler 1  . guaiFENesin-codeine 100-10 MG/5ML syrup Take 5 mLs by mouth every 6 (six) hours as needed for cough. (Patient not taking: Reported on 07/04/2017) 60 mL 0  . oxyCODONE-acetaminophen (PERCOCET/ROXICET) 5-325 MG tablet Take 1 tablet  by mouth every 6 (six) hours as needed for moderate pain. (Patient not taking: Reported on 09/04/2017) 12 tablet 0  . valACYclovir (VALTREX) 1000 MG tablet Take 1 tablet (1,000 mg total) by mouth 3 (three) times daily. (Patient not taking: Reported on 09/04/2017) 21 tablet 0   No facility-administered medications prior to visit.     ROS: Review of Systems  Constitutional: Negative for activity change, appetite change, chills, fatigue and unexpected weight change.  HENT: Negative for congestion, mouth sores and sinus pressure.   Eyes: Negative for visual disturbance.  Respiratory: Negative for cough and chest tightness.   Gastrointestinal: Negative for abdominal pain and nausea.  Genitourinary: Negative for difficulty urinating, frequency and vaginal pain.  Musculoskeletal: Positive for arthralgias, back pain and gait problem.  Skin: Negative for pallor and rash.  Neurological: Negative for dizziness, tremors, weakness, numbness and headaches.  Psychiatric/Behavioral: Negative for confusion and sleep disturbance.    Objective:  BP 130/68 (BP Location: Left Arm, Patient Position: Sitting, Cuff Size: Large)   Pulse 76   Temp 98.6 F (37 C) (Oral)   Ht 5' (1.524 m)   Wt 183 lb (83 kg)   SpO2 98%   BMI 35.74 kg/m   BP Readings from Last 3 Encounters:  09/04/17 130/68  08/29/17 (!) 159/65  08/04/17 (!) 126/37    Wt Readings from Last 3  Encounters:  09/04/17 183 lb (83 kg)  08/29/17 185 lb 14.4 oz (84.3 kg)  08/02/17 183 lb (83 kg)    Physical Exam  Constitutional: She appears well-developed. No distress.  HENT:  Head: Normocephalic.  Right Ear: External ear normal.  Left Ear: External ear normal.  Nose: Nose normal.  Mouth/Throat: Oropharynx is clear and moist.  Eyes: Pupils are equal, round, and reactive to light. Conjunctivae are normal. Right eye exhibits no discharge. Left eye exhibits no discharge.  Neck: Normal range of motion. Neck supple. No JVD present. No  tracheal deviation present. No thyromegaly present.  Cardiovascular: Normal rate, regular rhythm and normal heart sounds.  Pulmonary/Chest: No stridor. No respiratory distress. She has no wheezes.  Abdominal: Soft. Bowel sounds are normal. She exhibits no distension and no mass. There is no tenderness. There is no rebound and no guarding.  Musculoskeletal: She exhibits tenderness. She exhibits no edema.  Lymphadenopathy:    She has no cervical adenopathy.  Neurological: She displays normal reflexes. No cranial nerve deficit. She exhibits normal muscle tone. Coordination abnormal.  Skin: No rash noted. No erythema.  Psychiatric: She has a normal mood and affect. Her behavior is normal. Judgment and thought content normal.  incisions - healing Obese cane  Lab Results  Component Value Date   WBC 7.2 08/03/2017   HGB 12.1 08/03/2017   HCT 38.1 08/03/2017   PLT 137 (L) 08/03/2017   GLUCOSE 142 (H) 08/03/2017   CHOL 142 02/15/2017   TRIG 141.0 02/15/2017   HDL 61.40 02/15/2017   LDLDIRECT 69.0 09/09/2014   LDLCALC 53 02/15/2017   ALT 20 07/24/2017   AST 26 07/24/2017   NA 138 08/03/2017   K 4.2 08/03/2017   CL 105 08/03/2017   CREATININE 0.67 08/03/2017   BUN 13 08/03/2017   CO2 25 08/03/2017   TSH 1.44 08/09/2016   INR 0.96 07/24/2017   HGBA1C 5.8 02/18/2016    No results found.  Assessment & Plan:   Diagnoses and all orders for this visit:  Malignant neoplasm of colon, unspecified part of colon ([place]) -     clopidogrel (PLAVIX) 75 MG tablet; Take 1 tablet (75 mg total) by mouth daily.  Other orders -     allopurinol (ZYLOPRIM) 100 MG tablet; Take 1 tablet (100 mg total) by mouth daily. -     amLODipine (NORVASC) 5 MG tablet; Take 1 tablet (5 mg total) by mouth daily. -     atorvastatin (LIPITOR) 40 MG tablet; Take 1 tablet (40 mg total) by mouth every evening. -     enalapril (VASOTEC) 10 MG tablet; Take 1 tablet (10 mg total) by mouth daily. -      triamterene-hydrochlorothiazide (MAXZIDE-25) 37.5-25 MG tablet; Take 1 tablet by mouth daily.     Meds ordered this encounter  Medications  . allopurinol (ZYLOPRIM) 100 MG tablet    Sig: Take 1 tablet (100 mg total) by mouth daily.    Dispense:  90 tablet    Refill:  3  . amLODipine (NORVASC) 5 MG tablet    Sig: Take 1 tablet (5 mg total) by mouth daily.    Dispense:  90 tablet    Refill:  3  . atorvastatin (LIPITOR) 40 MG tablet    Sig: Take 1 tablet (40 mg total) by mouth every evening.    Dispense:  90 tablet    Refill:  3  . clopidogrel (PLAVIX) 75 MG tablet    Sig: Take 1 tablet (75  mg total) by mouth daily.    Dispense:  90 tablet    Refill:  3  . enalapril (VASOTEC) 10 MG tablet    Sig: Take 1 tablet (10 mg total) by mouth daily.    Dispense:  90 tablet    Refill:  3  . triamterene-hydrochlorothiazide (MAXZIDE-25) 37.5-25 MG tablet    Sig: Take 1 tablet by mouth daily.    Dispense:  90 tablet    Refill:  3     Follow-up: No follow-ups on file.  Walker Kehr, MD

## 2017-09-04 NOTE — Assessment & Plan Note (Signed)
Vasc surg surgery 08/02/17 B - Dr Donnetta Hutching

## 2017-09-06 ENCOUNTER — Ambulatory Visit: Payer: Self-pay

## 2017-09-06 NOTE — Telephone Encounter (Signed)
Please advise 

## 2017-09-06 NOTE — Telephone Encounter (Signed)
Patient called and says "I was in the office on Monday and spoke to Dr. Alain Marion about these gas pains I have. He examined me and didn't find anything. I decided to put myself on a BRAT diet to see if that would help, which it hasn't. Is there anything else I can do to help?" I advised I will send this to Dr. Alain Marion and someone will call back with his recommendation, she verbalized understanding.  Reason for Disposition . [1] Caller requesting NON-URGENT health information AND [2] PCP's office is the best resource  Answer Assessment - Initial Assessment Questions 1. REASON FOR CALL or QUESTION: "What is your reason for calling today?" or "How can I best help you?" or "What question do you have that I can help answer?"     Brat diet for gas.  Protocols used: INFORMATION ONLY CALL-A-AH

## 2017-09-06 NOTE — Telephone Encounter (Signed)
Try a probiotic Florastor 1 po bid x 2 weeks Thx

## 2017-09-07 NOTE — Telephone Encounter (Signed)
PEC informed pt when she called back

## 2017-09-07 NOTE — Telephone Encounter (Signed)
Patient called to inform of the message from Dr. Alain Marion and she says she already received the message to get a probiotic and she has taken 1 already for today.

## 2017-09-11 ENCOUNTER — Ambulatory Visit: Payer: Self-pay

## 2017-09-11 NOTE — Telephone Encounter (Signed)
This encounter was created in error - please disregard.

## 2017-09-11 NOTE — Telephone Encounter (Signed)
Message from Nils Flack sent at 09/11/2017 10:39 AM EDT   Summary: additional questions from prev call    Pt called again about her diarrhea. She has additional questions, and information that she forgot to tell when she talked to NT Arbie Cookey) previously. She would not disclose what exactly her question was.  Please call [phone]         Returned call to pt.  Stated she forgot to inform nurse during previous Triage call, that she tested positive for MRSA with nasal swab, preoperatively, on 07/24/17, prior to Vascular surgery.  Was treated with Mupirocin for 5 days, for the MRSA.  Advised will make note of this in chart, and she should talk to Dr. Alain Marion tomorrow, at her appt.  Advised that the MRSA result from nasal swab may be a separate issue, than the cause of her diarrhea.   Verb. Understanding.  Pt. Agrees with plan.

## 2017-09-11 NOTE — Telephone Encounter (Signed)
Pt. Call to inquire about adding some protein to her diet, and starting Imodium for continued diarrhea stools.  Stated she has had about 5-6 diarrhea stools/ day x the past 8 days.  Reported she is staying very well hydrated with Gatorade, green tea, and water.  Has been following the [employer].  Due to weakness, is asking if she can add some protein to her diet.  Stated she tried an egg white, and didn't feel that was a good decision.  Denied abd. Cramping or pain, blood in stool, nausea or vomiting, or fever/ chills.  Reported low grade fever of 99.1 last week.  Feels she is urinating in adequate amts. And denied dry mouth.  Reported she has some light-headed feeling when she gets up, but stated, has had this for quite a long time.  Stated she changes position gradually.  Reported she uses a walker for stability around her house, as she lives alone.  Sched. appt. In AM with Dr. Alain Marion.  The pt. did not want to see another provider.  Advised she should avoid dairy products, but could add nonfat protein, such as chicken breast.  Advised against started Imodium, until seen by Dr. Alain Marion tomorrow, in case she will need to give a stool sample.  Care advice given per protocol. Verb. Understanding. Agrees with plan.   Reason for Disposition . [1] MODERATE diarrhea (e.g., 4-6 times / day more than normal) AND [2] present > 48 hours (2 days)  Answer Assessment - Initial Assessment Questions 1. DIARRHEA SEVERITY: "How bad is the diarrhea?" "How many extra stools have you had in the past 24 hours than normal?"    - NO DIARRHEA (SCALE 0)   - MILD (SCALE 1-3): Few loose or mushy BMs; increase of 1-3 stools over normal daily number of stools; mild increase in ostomy output.   -  MODERATE (SCALE 4-7): Increase of 4-6 stools daily over normal; moderate increase in ostomy output. * SEVERE (SCALE 8-10; OR 'WORST POSSIBLE'): Increase of 7 or more stools daily over normal; moderate increase in ostomy output;  incontinence.     Moderate about 5-6 / day  2. ONSET: "When did the diarrhea begin?"     About 8 days ago  3. BM CONSISTENCY: "How loose or watery is the diarrhea?"      Fluctuates watery to brown mucousy loose consistency 4. VOMITING: "Are you also vomiting?" If so, ask: "How many times in the past 24 hours?"      Denied n/v 5. ABDOMINAL PAIN: "Are you having any abdominal pain?" If yes: "What does it feel like?" (e.g., crampy, dull, intermittent, constant)      Denied pain or cramping 6. ABDOMINAL PAIN SEVERITY: If present, ask: "How bad is the pain?"  (e.g., Scale 1-10; mild, moderate, or severe)   - MILD (1-3): doesn't interfere with normal activities, abdomen soft and not tender to touch    - MODERATE (4-7): interferes with normal activities or awakens from sleep, tender to touch    - SEVERE (8-10): excruciating pain, doubled over, unable to do any normal activities       N/a  7. ORAL INTAKE: If vomiting, "Have you been able to drink liquids?" "How much fluids have you had in the past 24 hours?"    Drinking well; gatorade, green tea, H20 8. HYDRATION: "Any signs of dehydration?" (e.g., dry mouth [not just dry lips], too weak to stand, dizziness, new weight loss) "When did you last urinate?"  Denied dry mouth; c/o feeling more weak 9. EXPOSURE: "Have you traveled to a foreign country recently?" "Have you been exposed to anyone with diarrhea?" "Could you have eaten any food that was spoiled?"    Denied  10. ANTIBIOTIC USE: "Are you taking antibiotics now or have you taken antibiotics in the past 2 months?"       Denied  11. OTHER SYMPTOMS: "Do you have any other symptoms?" (e.g., fever, blood in stool)       denied 12. PREGNANCY: "Is there any chance you are pregnant?" "When was your last menstrual period?"       n/a  Protocols used: DIARRHEA-A-AH

## 2017-09-12 ENCOUNTER — Encounter: Payer: Self-pay | Admitting: Internal Medicine

## 2017-09-12 ENCOUNTER — Ambulatory Visit (INDEPENDENT_AMBULATORY_CARE_PROVIDER_SITE_OTHER): Payer: Medicare Other | Admitting: Internal Medicine

## 2017-09-12 DIAGNOSIS — M5440 Lumbago with sciatica, unspecified side: Secondary | ICD-10-CM

## 2017-09-12 DIAGNOSIS — I10 Essential (primary) hypertension: Secondary | ICD-10-CM | POA: Diagnosis not present

## 2017-09-12 DIAGNOSIS — R197 Diarrhea, unspecified: Secondary | ICD-10-CM

## 2017-09-12 DIAGNOSIS — G8929 Other chronic pain: Secondary | ICD-10-CM | POA: Diagnosis not present

## 2017-09-12 NOTE — Assessment & Plan Note (Signed)
No change 

## 2017-09-12 NOTE — Assessment & Plan Note (Signed)
Cont w/meds

## 2017-09-12 NOTE — Progress Notes (Signed)
Subjective:  Patient ID: Gina [name], female    DOB: May 08, 1931  Age: 84 y.o. MRN: [id number]  CC: No chief complaint on file.   HPI Gina [name] presents for diarrhea x 8-9 d - started with 5-6 BMs a day, now <3 a day. No blood. No cramping. On Florastor x 5 d Recovering from surgery  Outpatient Medications Prior to Visit  Medication Sig Dispense Refill  . acetaminophen (TYLENOL) 500 MG tablet Take 500-1,000 mg by mouth 3 (three) times daily as needed (for pain.).     [name] allopurinol (ZYLOPRIM) 100 MG tablet Take 1 tablet (100 mg total) by mouth daily. 90 tablet 3  . amLODipine (NORVASC) 5 MG tablet Take 1 tablet (5 mg total) by mouth daily. 90 tablet 3  . atorvastatin (LIPITOR) 40 MG tablet Take 1 tablet (40 mg total) by mouth every evening. 90 tablet 3  . Calcium Carbonate (CALCIUM 600 PO) Take 600 mg by mouth 2 (two) times daily.     . cetirizine (ZYRTEC) 10 MG tablet Take 10 mg by mouth daily.    . clopidogrel (PLAVIX) 75 MG tablet Take 1 tablet (75 mg total) by mouth daily. 90 tablet 3  . enalapril (VASOTEC) 10 MG tablet Take 1 tablet (10 mg total) by mouth daily. 90 tablet 3  . Multiple Vitamin (MULTIVITAMIN WITH MINERALS) TABS tablet Take 1 tablet by mouth daily.    . Multiple Vitamins-Minerals (PRESERVISION AREDS 2 PO) Take 1 tablet by mouth daily.    [name] triamterene-hydrochlorothiazide (MAXZIDE-25) 37.5-25 MG tablet Take 1 tablet by mouth daily. 90 tablet 3  . vitamin C (ASCORBIC ACID) 500 MG tablet Take 500 mg by mouth every evening.     No facility-administered medications prior to visit.     ROS: Review of Systems  Constitutional: Negative for activity change, appetite change, chills, fatigue, fever and unexpected weight change.  HENT: Negative for congestion, mouth sores and sinus pressure.   Eyes: Negative for visual disturbance.  Respiratory: Negative for cough and chest tightness.   Gastrointestinal: Positive for diarrhea. Negative for abdominal pain, blood  in stool, nausea and vomiting.  Genitourinary: Negative for difficulty urinating, frequency and vaginal pain.  Musculoskeletal: Negative for back pain and gait problem.  Skin: Negative for pallor and rash.  Neurological: Negative for dizziness, tremors, weakness, numbness and headaches.  Psychiatric/Behavioral: Negative for confusion and sleep disturbance.    Objective:  BP 138/68 (BP Location: Left Arm, Patient Position: Sitting, Cuff Size: Normal)   Pulse 78   Temp 99.1 F (37.3 C) (Oral)   Ht 5' (1.524 m)   Wt 178 lb (80.7 kg)   SpO2 99%   BMI 34.76 kg/m   BP Readings from Last 3 Encounters:  09/12/17 138/68  09/04/17 130/68  08/29/17 (!) 159/65    Wt Readings from Last 3 Encounters:  09/12/17 178 lb (80.7 kg)  09/04/17 183 lb (83 kg)  08/29/17 185 lb 14.4 oz (84.3 kg)    Physical Exam  Constitutional: She appears well-developed. No distress.  HENT:  Head: Normocephalic.  Right Ear: External ear normal.  Left Ear: External ear normal.  Nose: Nose normal.  Mouth/Throat: Oropharynx is clear and moist.  Eyes: Pupils are equal, round, and reactive to light. Conjunctivae are normal. Right eye exhibits no discharge. Left eye exhibits no discharge.  Neck: Normal range of motion. Neck supple. No JVD present. No tracheal deviation present. No thyromegaly present.  Cardiovascular: Normal rate, regular rhythm and normal heart sounds.  Pulmonary/Chest: No stridor. No respiratory distress. She has no wheezes.  Abdominal: Soft. Bowel sounds are normal. She exhibits no distension and no mass. There is no tenderness. There is no rebound and no guarding.  Musculoskeletal: She exhibits no edema or tenderness.  Lymphadenopathy:    She has no cervical adenopathy.  Neurological: She displays normal reflexes. No cranial nerve deficit. She exhibits normal muscle tone. Coordination normal.  Skin: No rash noted. No erythema.  Psychiatric: She has a normal mood and affect. Her behavior is  normal. Judgment and thought content normal.  looks well abd s/nt Wound - healed per pt  Lab Results  Component Value Date   WBC 7.2 08/03/2017   HGB 12.1 08/03/2017   HCT 38.1 08/03/2017   PLT 137 (L) 08/03/2017   GLUCOSE 142 (H) 08/03/2017   CHOL 142 02/15/2017   TRIG 141.0 02/15/2017   HDL 61.40 02/15/2017   LDLDIRECT 69.0 09/09/2014   LDLCALC 53 02/15/2017   ALT 20 07/24/2017   AST 26 07/24/2017   NA 138 08/03/2017   K 4.2 08/03/2017   CL 105 08/03/2017   CREATININE 0.67 08/03/2017   BUN 13 08/03/2017   CO2 25 08/03/2017   TSH 1.44 08/09/2016   INR 0.96 07/24/2017   HGBA1C 5.8 02/18/2016    No results found.  Assessment & Plan:   There are no diagnoses linked to this encounter.   No orders of the defined types were placed in this encounter.    Follow-up: No follow-ups on file.  Walker Kehr, MD

## 2017-09-12 NOTE — Assessment & Plan Note (Signed)
Stool tests Imodium

## 2017-09-13 ENCOUNTER — Other Ambulatory Visit: Payer: Medicare Other

## 2017-09-13 DIAGNOSIS — R197 Diarrhea, unspecified: Secondary | ICD-10-CM | POA: Diagnosis not present

## 2017-09-15 LAB — CLOSTRIDIUM DIFFICILE EIA: C difficile Toxins A+B, EIA: NEGATIVE

## 2017-09-21 DIAGNOSIS — M4726 Other spondylosis with radiculopathy, lumbar region: Secondary | ICD-10-CM | POA: Diagnosis not present

## 2017-09-21 DIAGNOSIS — M48061 Spinal stenosis, lumbar region without neurogenic claudication: Secondary | ICD-10-CM | POA: Diagnosis not present

## 2017-09-21 DIAGNOSIS — M5416 Radiculopathy, lumbar region: Secondary | ICD-10-CM | POA: Diagnosis not present

## 2017-09-21 DIAGNOSIS — M5116 Intervertebral disc disorders with radiculopathy, lumbar region: Secondary | ICD-10-CM | POA: Diagnosis not present

## 2017-10-31 ENCOUNTER — Encounter: Payer: Self-pay | Admitting: Vascular Surgery

## 2017-10-31 ENCOUNTER — Other Ambulatory Visit: Payer: Self-pay

## 2017-10-31 ENCOUNTER — Ambulatory Visit (HOSPITAL_COMMUNITY)
Admission: RE | Admit: 2017-10-31 | Discharge: 2017-10-31 | Disposition: A | Discharge status: Home / Self Care | Payer: Medicare Other | Source: Ambulatory Visit | Attending: Vascular Surgery | Admitting: Vascular Surgery

## 2017-10-31 ENCOUNTER — Ambulatory Visit (INDEPENDENT_AMBULATORY_CARE_PROVIDER_SITE_OTHER): Payer: Self-pay | Admitting: Vascular Surgery

## 2017-10-31 VITALS — BP 176/81 | HR 85 | Temp 98.2°F | Resp 20 | Ht 60.0 in | Wt 182.0 lb

## 2017-10-31 DIAGNOSIS — I739 Peripheral vascular disease, unspecified: Secondary | ICD-10-CM | POA: Diagnosis not present

## 2017-10-31 NOTE — Progress Notes (Signed)
   Patient name: Gina [name] MRN: [id number] DOB: 1931-08-15 Sex: female  REASON FOR VISIT: Low up bilateral femoral endarterectomy on 08/02/2017  HPI: Gina [name] is a 84 y.o. female here today for follow-up.  Underwent bilateral femoral endarterectomy and patch angioplasty for severely limiting claudication.  She has done extremely well and reports complete resolution of her claudication.  She does have some degenerative disc disease and is limited in her activity related to this.  She noted swelling prior to surgery and this is persisted in both lower extremities with some pitting edema  Current Outpatient Medications  Medication Sig Dispense Refill  . acetaminophen (TYLENOL) 500 MG tablet Take 500-1,000 mg by mouth 3 (three) times daily as needed (for pain.).     [name] allopurinol (ZYLOPRIM) 100 MG tablet Take 1 tablet (100 mg total) by mouth daily. 90 tablet 3  . amLODipine (NORVASC) 5 MG tablet Take 1 tablet (5 mg total) by mouth daily. 90 tablet 3  . atorvastatin (LIPITOR) 40 MG tablet Take 1 tablet (40 mg total) by mouth every evening. 90 tablet 3  . Calcium Carbonate (CALCIUM 600 PO) Take 600 mg by mouth 2 (two) times daily.     . cetirizine (ZYRTEC) 10 MG tablet Take 10 mg by mouth daily.    . clopidogrel (PLAVIX) 75 MG tablet Take 1 tablet (75 mg total) by mouth daily. 90 tablet 3  . enalapril (VASOTEC) 10 MG tablet Take 1 tablet (10 mg total) by mouth daily. 90 tablet 3  . Multiple Vitamin (MULTIVITAMIN WITH MINERALS) TABS tablet Take 1 tablet by mouth daily.    . Multiple Vitamins-Minerals (PRESERVISION AREDS 2 PO) Take 1 tablet by mouth daily.    [name] triamterene-hydrochlorothiazide (MAXZIDE-25) 37.5-25 MG tablet Take 1 tablet by mouth daily. 90 tablet 3  . vitamin C (ASCORBIC ACID) 500 MG tablet Take 500 mg by mouth every evening.     No current facility-administered medications for this visit.      PHYSICAL EXAM: Vitals:   10/31/17 1037   BP: (!) 176/81  Pulse: 85  Resp: 20  Temp: 98.2 F (36.8 C)  TempSrc: Oral  SpO2: 91%  Weight: 182 lb (82.6 kg)  Height: 5' (1.524 m)    GENERAL: The patient is a well-nourished female, in no acute distress. The vital signs are documented above. Both groin incisions well-healed with femoral pulses present.  Moderate bilateral lower extremity pitting edema  Invasive studies show continued improvement in her ankle arm index at 0.69 on the right and 0.60 on the left  MEDICAL ISSUES: Stable now 3 months out from her procedure.  We will continue to increase her activities as tolerated.  Does have some leg swelling and may benefit from diuretic if this persists.  She does have thigh-high 20 to 30 mmHg compression and uses these as well.  She will see our PA/NP clinic 1 year with repeat ankle arm indices.  If she is symptomatically stable would discontinue follow-up after that time   Rosetta Posner, MD [facility] Tel [phone] Pager [phone]

## 2017-11-20 ENCOUNTER — Other Ambulatory Visit (INDEPENDENT_AMBULATORY_CARE_PROVIDER_SITE_OTHER): Payer: Medicare Other

## 2017-11-20 ENCOUNTER — Encounter: Payer: Self-pay | Admitting: Internal Medicine

## 2017-11-20 ENCOUNTER — Ambulatory Visit (INDEPENDENT_AMBULATORY_CARE_PROVIDER_SITE_OTHER): Payer: Medicare Other | Admitting: Internal Medicine

## 2017-11-20 VITALS — BP 136/78 | HR 83 | Temp 97.9°F | Ht 60.0 in | Wt 184.0 lb

## 2017-11-20 DIAGNOSIS — E785 Hyperlipidemia, unspecified: Secondary | ICD-10-CM | POA: Diagnosis not present

## 2017-11-20 DIAGNOSIS — I10 Essential (primary) hypertension: Secondary | ICD-10-CM | POA: Diagnosis not present

## 2017-11-20 DIAGNOSIS — I739 Peripheral vascular disease, unspecified: Secondary | ICD-10-CM

## 2017-11-20 DIAGNOSIS — Z23 Encounter for immunization: Secondary | ICD-10-CM

## 2017-11-20 DIAGNOSIS — R7309 Other abnormal glucose: Secondary | ICD-10-CM

## 2017-11-20 DIAGNOSIS — R270 Ataxia, unspecified: Secondary | ICD-10-CM | POA: Diagnosis not present

## 2017-11-20 DIAGNOSIS — C189 Malignant neoplasm of colon, unspecified: Secondary | ICD-10-CM | POA: Diagnosis not present

## 2017-11-20 DIAGNOSIS — T148XXA Other injury of unspecified body region, initial encounter: Secondary | ICD-10-CM | POA: Diagnosis not present

## 2017-11-20 DIAGNOSIS — R202 Paresthesia of skin: Secondary | ICD-10-CM

## 2017-11-20 LAB — BASIC METABOLIC PANEL
BUN: 21 mg/dL (ref 6–23)
CO2: 29 mEq/L (ref 19–32)
Calcium: 9.6 mg/dL (ref 8.4–10.5)
Chloride: 96 mEq/L (ref 96–112)
Creatinine, Ser: 0.82 mg/dL (ref 0.40–1.20)
GFR: 70.52 mL/min (ref >60.00)
Glucose, Bld: 101 mg/dL — ABNORMAL HIGH (ref 70–99)
Potassium: 4 mEq/L (ref 3.5–5.1)
Sodium: 135 mEq/L (ref 135–145)

## 2017-11-20 LAB — CBC WITH DIFFERENTIAL/PLATELET
Basophils Absolute: 0 10*3/uL (ref 0.0–0.1)
Basophils Relative: 0.7 % (ref 0.0–3.0)
Eosinophils Absolute: 0.1 10*3/uL (ref 0.0–0.7)
Eosinophils Relative: 2.4 % (ref 0.0–5.0)
HCT: 37.2 % (ref 36.0–46.0)
Hemoglobin: 12.6 g/dL (ref 12.0–15.0)
Lymphocytes Relative: 30.9 % (ref 12.0–46.0)
Lymphs Abs: 1.7 10*3/uL (ref 0.7–4.0)
MCHC: 33.9 g/dL (ref 30.0–36.0)
MCV: 100 fl (ref 78.0–100.0)
Monocytes Absolute: 0.7 10*3/uL (ref 0.1–1.0)
Monocytes Relative: 13.1 % — ABNORMAL HIGH (ref 3.0–12.0)
Neutro Abs: 2.9 10*3/uL (ref 1.4–7.7)
Neutrophils Relative %: 52.9 % (ref 43.0–77.0)
Platelets: 290 10*3/uL (ref 150.0–400.0)
RBC: 3.71 Mil/uL — ABNORMAL LOW (ref 3.87–5.11)
RDW: 16.5 % — ABNORMAL HIGH (ref 11.5–15.5)
WBC: 5.4 10*3/uL (ref 4.0–10.5)

## 2017-11-20 LAB — LIPID PANEL
Cholesterol: 157 mg/dL (ref 0–200)
HDL: 77.3 mg/dL (ref >39.00)
LDL Cholesterol: 57 mg/dL (ref 0–99)
NonHDL: 79.81
Total CHOL/HDL Ratio: 2
Triglycerides: 113 mg/dL (ref 0.0–149.0)
VLDL: 22.6 mg/dL (ref 0.0–40.0)

## 2017-11-20 LAB — VITAMIN B12: Vitamin B-12: 355 pg/mL (ref 211–911)

## 2017-11-20 NOTE — Progress Notes (Signed)
Subjective:  Patient ID: Gina [name], female    DOB: Jan 20, 1932  Age: 84 y.o. MRN: [id number]  CC: No chief complaint on file.   HPI Gina [name] presents for OA, HTN,  C/o bruising - more lately  Outpatient Medications Prior to Visit  Medication Sig Dispense Refill  . acetaminophen (TYLENOL) 500 MG tablet Take 500-1,000 mg by mouth 3 (three) times daily as needed (for pain.).     [name] allopurinol (ZYLOPRIM) 100 MG tablet Take 1 tablet (100 mg total) by mouth daily. 90 tablet 3  . amLODipine (NORVASC) 5 MG tablet Take 1 tablet (5 mg total) by mouth daily. 90 tablet 3  . atorvastatin (LIPITOR) 40 MG tablet Take 1 tablet (40 mg total) by mouth every evening. 90 tablet 3  . Calcium Carbonate (CALCIUM 600 PO) Take 600 mg by mouth 2 (two) times daily.     . cetirizine (ZYRTEC) 10 MG tablet Take 10 mg by mouth daily.    . clopidogrel (PLAVIX) 75 MG tablet Take 1 tablet (75 mg total) by mouth daily. 90 tablet 3  . enalapril (VASOTEC) 10 MG tablet Take 1 tablet (10 mg total) by mouth daily. 90 tablet 3  . Multiple Vitamin (MULTIVITAMIN WITH MINERALS) TABS tablet Take 1 tablet by mouth daily.    . Multiple Vitamins-Minerals (PRESERVISION AREDS 2 PO) Take 1 tablet by mouth daily.    [name] triamterene-hydrochlorothiazide (MAXZIDE-25) 37.5-25 MG tablet Take 1 tablet by mouth daily. 90 tablet 3  . vitamin C (ASCORBIC ACID) 500 MG tablet Take 500 mg by mouth every evening.     No facility-administered medications prior to visit.     ROS: Review of Systems  Constitutional: Positive for fatigue. Negative for activity change, appetite change, chills and unexpected weight change.  HENT: Negative for congestion, mouth sores and sinus pressure.   Eyes: Negative for visual disturbance.  Respiratory: Negative for cough and chest tightness.   Gastrointestinal: Negative for abdominal pain and nausea.  Genitourinary: Negative for difficulty urinating, frequency and vaginal pain.  Musculoskeletal:  Positive for back pain and gait problem.  Skin: Negative for pallor and rash.  Neurological: Negative for dizziness, tremors, weakness, numbness and headaches.  Psychiatric/Behavioral: Negative for confusion, sleep disturbance and suicidal ideas.  bruising - better Can walk better  Objective:  BP 136/78 (BP Location: Left Arm, Patient Position: Sitting, Cuff Size: Large)   Pulse 83   Temp 97.9 F (36.6 C) (Oral)   Ht 5' (1.524 m)   Wt 184 lb (83.5 kg)   SpO2 96%   BMI 35.94 kg/m   BP Readings from Last 3 Encounters:  11/20/17 136/78  10/31/17 (!) 176/81  09/12/17 138/68    Wt Readings from Last 3 Encounters:  11/20/17 184 lb (83.5 kg)  10/31/17 182 lb (82.6 kg)  09/12/17 178 lb (80.7 kg)    Physical Exam  Constitutional: She appears well-developed. No distress.  HENT:  Head: Normocephalic.  Right Ear: External ear normal.  Left Ear: External ear normal.  Nose: Nose normal.  Mouth/Throat: Oropharynx is clear and moist.  Eyes: Pupils are equal, round, and reactive to light. Conjunctivae are normal. Right eye exhibits no discharge. Left eye exhibits no discharge.  Neck: Normal range of motion. Neck supple. No JVD present. No tracheal deviation present. No thyromegaly present.  Cardiovascular: Normal rate, regular rhythm and normal heart sounds.  Pulmonary/Chest: No stridor. No respiratory distress. She has no wheezes.  Abdominal: Soft. Bowel sounds are normal. She exhibits no  distension and no mass. There is no tenderness. There is no rebound and no guarding.  Musculoskeletal: She exhibits no edema or tenderness.  Lymphadenopathy:    She has no cervical adenopathy.  Neurological: She displays normal reflexes. No cranial nerve deficit. She exhibits normal muscle tone. Coordination normal.  Skin: No rash noted. No erythema.  Psychiatric: She has a normal mood and affect. Her behavior is normal. Judgment and thought content normal.  UEs w/bruises ataxic  Lab Results    Component Value Date   WBC 7.2 08/03/2017   HGB 12.1 08/03/2017   HCT 38.1 08/03/2017   PLT 137 (L) 08/03/2017   GLUCOSE 142 (H) 08/03/2017   CHOL 142 02/15/2017   TRIG 141.0 02/15/2017   HDL 61.40 02/15/2017   LDLDIRECT 69.0 09/09/2014   LDLCALC 53 02/15/2017   ALT 20 07/24/2017   AST 26 07/24/2017   NA 138 08/03/2017   K 4.2 08/03/2017   CL 105 08/03/2017   CREATININE 0.67 08/03/2017   BUN 13 08/03/2017   CO2 25 08/03/2017   TSH 1.44 08/09/2016   INR 0.96 07/24/2017   HGBA1C 5.8 02/18/2016    No results found.  Assessment & Plan:   There are no diagnoses linked to this encounter.   No orders of the defined types were placed in this encounter.    Follow-up: No follow-ups on file.  Walker Kehr, MD

## 2017-11-20 NOTE — Addendum Note (Signed)
Addended by: Karren Cobble on: 11/20/2017 08:39 AM   Modules accepted: Orders

## 2017-11-20 NOTE — Assessment & Plan Note (Signed)
CBC, INR Will watch

## 2017-11-20 NOTE — Assessment & Plan Note (Signed)
Balance exercises

## 2017-11-20 NOTE — Assessment & Plan Note (Signed)
Labs

## 2017-11-20 NOTE — Assessment & Plan Note (Signed)
Norvasc, Enalapril, Triamt/HCTZ

## 2018-01-09 DIAGNOSIS — M47816 Spondylosis without myelopathy or radiculopathy, lumbar region: Secondary | ICD-10-CM | POA: Diagnosis not present

## 2018-01-10 DIAGNOSIS — M47816 Spondylosis without myelopathy or radiculopathy, lumbar region: Secondary | ICD-10-CM | POA: Diagnosis not present

## 2018-01-10 DIAGNOSIS — M5117 Intervertebral disc disorders with radiculopathy, lumbosacral region: Secondary | ICD-10-CM | POA: Diagnosis not present

## 2018-01-10 DIAGNOSIS — M48061 Spinal stenosis, lumbar region without neurogenic claudication: Secondary | ICD-10-CM | POA: Diagnosis not present

## 2018-01-22 DIAGNOSIS — M546 Pain in thoracic spine: Secondary | ICD-10-CM | POA: Diagnosis not present

## 2018-01-22 DIAGNOSIS — Z6833 Body mass index (BMI) 33.0-33.9, adult: Secondary | ICD-10-CM | POA: Diagnosis not present

## 2018-01-22 DIAGNOSIS — M5136 Other intervertebral disc degeneration, lumbar region: Secondary | ICD-10-CM | POA: Diagnosis not present

## 2018-01-22 DIAGNOSIS — M47816 Spondylosis without myelopathy or radiculopathy, lumbar region: Secondary | ICD-10-CM | POA: Diagnosis not present

## 2018-01-22 DIAGNOSIS — M48062 Spinal stenosis, lumbar region with neurogenic claudication: Secondary | ICD-10-CM | POA: Diagnosis not present

## 2018-01-22 DIAGNOSIS — I10 Essential (primary) hypertension: Secondary | ICD-10-CM | POA: Diagnosis not present

## 2018-01-22 DIAGNOSIS — M4316 Spondylolisthesis, lumbar region: Secondary | ICD-10-CM | POA: Diagnosis not present

## 2018-01-25 DIAGNOSIS — M48061 Spinal stenosis, lumbar region without neurogenic claudication: Secondary | ICD-10-CM | POA: Diagnosis not present

## 2018-01-25 DIAGNOSIS — M5136 Other intervertebral disc degeneration, lumbar region: Secondary | ICD-10-CM | POA: Diagnosis not present

## 2018-01-25 DIAGNOSIS — M4726 Other spondylosis with radiculopathy, lumbar region: Secondary | ICD-10-CM | POA: Diagnosis not present

## 2018-02-22 ENCOUNTER — Emergency Department (HOSPITAL_COMMUNITY): Payer: Medicare Other

## 2018-02-22 ENCOUNTER — Other Ambulatory Visit: Payer: Self-pay

## 2018-02-22 ENCOUNTER — Encounter (HOSPITAL_COMMUNITY): Payer: Self-pay | Admitting: Emergency Medicine

## 2018-02-22 ENCOUNTER — Observation Stay (HOSPITAL_COMMUNITY)
Admission: EM | Admit: 2018-02-22 | Discharge: 2018-02-23 | Disposition: A | Discharge status: Home / Self Care | Payer: Medicare Other | Attending: Internal Medicine | Admitting: Internal Medicine

## 2018-02-22 DIAGNOSIS — D539 Nutritional anemia, unspecified: Secondary | ICD-10-CM | POA: Diagnosis not present

## 2018-02-22 DIAGNOSIS — C189 Malignant neoplasm of colon, unspecified: Secondary | ICD-10-CM

## 2018-02-22 DIAGNOSIS — E785 Hyperlipidemia, unspecified: Secondary | ICD-10-CM | POA: Insufficient documentation

## 2018-02-22 DIAGNOSIS — R404 Transient alteration of awareness: Secondary | ICD-10-CM | POA: Diagnosis not present

## 2018-02-22 DIAGNOSIS — G459 Transient cerebral ischemic attack, unspecified: Secondary | ICD-10-CM | POA: Diagnosis not present

## 2018-02-22 DIAGNOSIS — R609 Edema, unspecified: Secondary | ICD-10-CM | POA: Diagnosis not present

## 2018-02-22 DIAGNOSIS — Z85038 Personal history of other malignant neoplasm of large intestine: Secondary | ICD-10-CM | POA: Diagnosis not present

## 2018-02-22 DIAGNOSIS — Z79899 Other long term (current) drug therapy: Secondary | ICD-10-CM | POA: Diagnosis not present

## 2018-02-22 DIAGNOSIS — I739 Peripheral vascular disease, unspecified: Secondary | ICD-10-CM | POA: Insufficient documentation

## 2018-02-22 DIAGNOSIS — E876 Hypokalemia: Secondary | ICD-10-CM | POA: Diagnosis not present

## 2018-02-22 DIAGNOSIS — R488 Other symbolic dysfunctions: Secondary | ICD-10-CM | POA: Diagnosis not present

## 2018-02-22 DIAGNOSIS — R410 Disorientation, unspecified: Secondary | ICD-10-CM | POA: Diagnosis not present

## 2018-02-22 DIAGNOSIS — Z87891 Personal history of nicotine dependence: Secondary | ICD-10-CM | POA: Insufficient documentation

## 2018-02-22 DIAGNOSIS — N390 Urinary tract infection, site not specified: Secondary | ICD-10-CM | POA: Diagnosis not present

## 2018-02-22 DIAGNOSIS — R200 Anesthesia of skin: Secondary | ICD-10-CM | POA: Diagnosis present

## 2018-02-22 DIAGNOSIS — M109 Gout, unspecified: Secondary | ICD-10-CM | POA: Diagnosis not present

## 2018-02-22 DIAGNOSIS — I6602 Occlusion and stenosis of left middle cerebral artery: Secondary | ICD-10-CM | POA: Diagnosis not present

## 2018-02-22 DIAGNOSIS — I10 Essential (primary) hypertension: Secondary | ICD-10-CM | POA: Diagnosis not present

## 2018-02-22 DIAGNOSIS — R531 Weakness: Secondary | ICD-10-CM | POA: Diagnosis present

## 2018-02-22 DIAGNOSIS — I679 Cerebrovascular disease, unspecified: Secondary | ICD-10-CM | POA: Diagnosis not present

## 2018-02-22 LAB — URINALYSIS, ROUTINE W REFLEX MICROSCOPIC
Bilirubin Urine: NEGATIVE
Glucose, UA: NEGATIVE mg/dL
Hgb urine dipstick: NEGATIVE
Ketones, ur: NEGATIVE mg/dL
Nitrite: NEGATIVE
Protein, ur: NEGATIVE mg/dL
Specific Gravity, Urine: 1.01 (ref 1.005–1.030)
WBC, UA: >50 WBC/hpf — ABNORMAL HIGH (ref 0–5)
pH: 7 (ref 5.0–8.0)

## 2018-02-22 LAB — COMPREHENSIVE METABOLIC PANEL
ALT: 18 U/L (ref 0–44)
AST: 21 U/L (ref 15–41)
Albumin: 3.4 g/dL — ABNORMAL LOW (ref 3.5–5.0)
Alkaline Phosphatase: 55 U/L (ref 38–126)
Anion gap: 11 (ref 5–15)
BUN: 20 mg/dL (ref 8–23)
CO2: 26 mmol/L (ref 22–32)
Calcium: 9.5 mg/dL (ref 8.9–10.3)
Chloride: 94 mmol/L — ABNORMAL LOW (ref 98–111)
Creatinine, Ser: 0.95 mg/dL (ref 0.44–1.00)
GFR calc Af Amer: >60 mL/min (ref >60)
GFR calc non Af Amer: 55 mL/min — ABNORMAL LOW (ref >60)
Glucose, Bld: 143 mg/dL — ABNORMAL HIGH (ref 70–99)
Potassium: 3.3 mmol/L — ABNORMAL LOW (ref 3.5–5.1)
Sodium: 131 mmol/L — ABNORMAL LOW (ref 135–145)
Total Bilirubin: 0.8 mg/dL (ref 0.3–1.2)
Total Protein: 6.2 g/dL — ABNORMAL LOW (ref 6.5–8.1)

## 2018-02-22 LAB — TSH: TSH: 1.235 u[IU]/mL (ref 0.350–4.500)

## 2018-02-22 LAB — CBC
HCT: 37.6 % (ref 36.0–46.0)
Hemoglobin: 12.1 g/dL (ref 12.0–15.0)
MCH: 32.3 pg (ref 26.0–34.0)
MCHC: 32.2 g/dL (ref 30.0–36.0)
MCV: 100.3 fL — ABNORMAL HIGH (ref 80.0–100.0)
Platelets: 198 10*3/uL (ref 150–400)
RBC: 3.75 MIL/uL — ABNORMAL LOW (ref 3.87–5.11)
RDW: 13.6 % (ref 11.5–15.5)
WBC: 8 10*3/uL (ref 4.0–10.5)
nRBC: 0 % (ref 0.0–0.2)

## 2018-02-22 MED ORDER — ENOXAPARIN SODIUM 30 MG/0.3ML ~~LOC~~ SOLN: 30.0000 mg | Freq: Two times a day (BID) | SUBCUTANEOUS | Status: DC

## 2018-02-22 MED ORDER — SODIUM CHLORIDE 0.9 % IV SOLN
INTRAVENOUS | Status: DC
  Administered 2018-02-22 – 2018-02-23 (×2): via INTRAVENOUS

## 2018-02-22 MED ORDER — CALCIUM CARBONATE 1500 (600 CA) MG PO TABS
600.0000 mg | ORAL_TABLET | Freq: Two times a day (BID) | ORAL | Status: DC
  Filled 2018-02-22 (×2): qty 1

## 2018-02-22 MED ORDER — AMLODIPINE BESYLATE 5 MG PO TABS
5.0000 mg | ORAL_TABLET | Freq: Every day | ORAL | Status: DC
  Administered 2018-02-22 – 2018-02-23 (×2): 5 mg via ORAL
  Filled 2018-02-22 (×2): qty 1

## 2018-02-22 MED ORDER — TRIAMTERENE-HCTZ 37.5-25 MG PO TABS
1.0000 | ORAL_TABLET | Freq: Every day | ORAL | Status: DC
  Administered 2018-02-23: 1 via ORAL
  Filled 2018-02-22 (×2): qty 1

## 2018-02-22 MED ORDER — VITAMIN C 500 MG PO TABS
500.0000 mg | ORAL_TABLET | Freq: Every evening | ORAL | Status: DC
  Administered 2018-02-22 – 2018-02-23 (×2): 500 mg via ORAL
  Filled 2018-02-22 (×2): qty 1

## 2018-02-22 MED ORDER — CALCIUM CARBONATE 1250 (500 CA) MG PO TABS
1.0000 | ORAL_TABLET | Freq: Two times a day (BID) | ORAL | Status: DC
  Administered 2018-02-22 – 2018-02-23 (×3): 500 mg via ORAL
  Filled 2018-02-22 (×3): qty 1

## 2018-02-22 MED ORDER — STROKE: EARLY STAGES OF RECOVERY BOOK
Freq: Once | Status: AC
  Administered 2018-02-23: 01:00:00
  Filled 2018-02-22: qty 1

## 2018-02-22 MED ORDER — CLOPIDOGREL BISULFATE 75 MG PO TABS
75.0000 mg | ORAL_TABLET | Freq: Every day | ORAL | Status: DC
  Administered 2018-02-23: 75 mg via ORAL
  Filled 2018-02-22: qty 1

## 2018-02-22 MED ORDER — PRESERVISION AREDS 2 PO CAPS: ORAL_CAPSULE | Freq: Every day | ORAL | Status: DC

## 2018-02-22 MED ORDER — ENALAPRIL MALEATE 10 MG PO TABS
10.0000 mg | ORAL_TABLET | Freq: Every day | ORAL | Status: DC
  Administered 2018-02-22 – 2018-02-23 (×2): 10 mg via ORAL
  Filled 2018-02-22 (×2): qty 1

## 2018-02-22 MED ORDER — ATORVASTATIN CALCIUM 40 MG PO TABS
40.0000 mg | ORAL_TABLET | Freq: Every evening | ORAL | Status: DC
  Administered 2018-02-22 – 2018-02-23 (×2): 40 mg via ORAL
  Filled 2018-02-22 (×2): qty 1

## 2018-02-22 MED ORDER — ENOXAPARIN SODIUM 40 MG/0.4ML ~~LOC~~ SOLN: 40.0000 mg | SUBCUTANEOUS | Status: DC

## 2018-02-22 MED ORDER — IOPAMIDOL (ISOVUE-370) INJECTION 76%
75.0000 mL | Freq: Once | INTRAVENOUS | Status: AC | PRN
  Administered 2018-02-22: 75 mL via INTRAVENOUS

## 2018-02-22 MED ORDER — IOPAMIDOL (ISOVUE-370) INJECTION 76%
INTRAVENOUS | Status: AC
  Filled 2018-02-22: qty 100

## 2018-02-22 MED ORDER — ACETAMINOPHEN 500 MG PO TABS: 1000.0000 mg | ORAL_TABLET | Freq: Three times a day (TID) | ORAL | Status: DC | PRN

## 2018-02-22 MED ORDER — ACETAMINOPHEN 160 MG/5ML PO SOLN: 650.0000 mg | ORAL | Status: DC | PRN

## 2018-02-22 MED ORDER — SODIUM CHLORIDE 0.9 % IV SOLN
1.0000 g | INTRAVENOUS | Status: DC
  Administered 2018-02-22: 1 g via INTRAVENOUS
  Filled 2018-02-22: qty 10

## 2018-02-22 MED ORDER — ADULT MULTIVITAMIN W/MINERALS CH
1.0000 | ORAL_TABLET | Freq: Every day | ORAL | Status: DC
  Administered 2018-02-23: 1 via ORAL
  Filled 2018-02-22: qty 1

## 2018-02-22 MED ORDER — ACETAMINOPHEN 650 MG RE SUPP: 650.0000 mg | RECTAL | Status: DC | PRN

## 2018-02-22 MED ORDER — ALLOPURINOL 100 MG PO TABS
100.0000 mg | ORAL_TABLET | Freq: Every day | ORAL | Status: DC
  Administered 2018-02-23: 100 mg via ORAL
  Filled 2018-02-22: qty 1

## 2018-02-22 MED ORDER — ACETAMINOPHEN 325 MG PO TABS: 650.0000 mg | ORAL_TABLET | ORAL | Status: DC | PRN

## 2018-02-22 MED ORDER — OCUVITE-LUTEIN PO CAPS
1.0000 | ORAL_CAPSULE | Freq: Every day | ORAL | Status: DC
  Filled 2018-02-22: qty 1

## 2018-02-22 NOTE — H&P (Signed)
History and Physical    Gina [name] ZOX:[id number] DOB: 21-Jul-1931 DOA: 02/22/2018  PCP: Cassandria Anger, MD Consultants:  Neuro Patient coming from: home  Chief Complaint: R sided weakness, numbness, agraphia  HPI: Gina [name] is a 85 y.o. female with medical history significant for HTN, HLD, PAD, prior TIA in 2011 on plavix, h/o colon CA, gout, diverticulosis who presented to the ED today with c/o weakness and agraphia. She was in her usual state of health until earlier today when she was on her computer and realized she couldn't remember her password. In addition, although she had no blurred or double vision, she was unable to read the words on the screen. At that time she also noticed her R hand had become numb and weak. Symptoms began to improve after about 15 minutes and had completely resolved after 30 minutes. She reports no other neurologic symptoms, no HA, no chest pain, dyspnea, N/V/D, abd pain, fever/chills.   ED Course: VSS, neurologic exam was nL, no deficits. Code stroke was not called but neuro was consulted who recommended admission and TIA/CVA workup.  Review of Systems: As per HPI; otherwise review of systems reviewed and negative. Specifically no dysuria, fever, abd pain or flank pain.  Ambulatory Status:  Ambulates without assistance  Past Medical History:  Diagnosis Date  . Allergic rhinitis    seasonal  . Arthritis   . Colon cancer [facility]) Dec 12  . Diverticulosis of colon (without mention of hemorrhage)   . GI bleed    had cyst removed from spine....went back on plavix to soon  . Gout   . Hyperlipidemia   . Hyperplastic colon polyp   . Hypertension   . Macular degeneration   . PAD (peripheral artery disease) ([place])   . Pneumonia    back in 05/2017  . Stroke [facility])    TIA  10/2009  . Unspecified transient cerebral ischemia sept '11    Past Surgical History:  Procedure Laterality Date  . ABDOMINAL AORTOGRAM N/A 06/16/2017   Procedure:  ABDOMINAL AORTOGRAM;  Surgeon: Angelia Mould, MD;  Location: [place] [facility];  Service: Cardiovascular;  Laterality: N/A;  . APPENDECTOMY    . BACK SURGERY  April '12   benign cyst spine ([place])  . BREAST LUMPECTOMY     right breast '86  . COLON SURGERY  Dec '12   colectomy for colon cancer ([place])  . ENDARTERECTOMY FEMORAL Bilateral 08/02/2017  . ENDARTERECTOMY FEMORAL Bilateral 08/02/2017   Procedure: Bilateral Femoral ENDARTERECTOMY with Patch Angioplasty;  Surgeon: Rosetta Posner, MD;  Location: [place];  Service: Vascular;  Laterality: Bilateral;  . EYE SURGERY     bilateral cataracts  . LOWER EXTREMITY ANGIOGRAPHY N/A 06/16/2017   Procedure: LOWER EXTREMITY ANGIOGRAPHY;  Surgeon: Angelia Mould, MD;  Location: [place] [facility];  Service: Cardiovascular;  Laterality: N/A;  . PERIPHERAL VASCULAR INTERVENTION  06/16/2017   Procedure: PERIPHERAL VASCULAR INTERVENTION;  Surgeon: Angelia Mould, MD;  Location: [place] [facility];  Service: Cardiovascular;;  left common iliac  . TONSILLECTOMY      Social History   Socioeconomic History  . Marital status: Widowed    Spouse name: Not on file  . Number of children: 3  . Years of education: 4  . Highest education level: Not on file  Occupational History  . 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 Financial resource strain: Not on file  .  Food insecurity:    Worry: Not on file    Inability: Not on file  . Transportation needs:    Medical: Not on file    Non-medical: Not on file  Tobacco Use  . Smoking status: Former Smoker    Packs/day: 1.50    Years: 30.00    Pack years: 45.00    Types: Cigarettes    Last attempt to quit: 07/21/1987    Years since quitting: 30.6  . Smokeless tobacco: Never Used  Substance and Sexual Activity  . Alcohol use: Yes    Alcohol/week: 14.0 standard drinks    Types: 14 Glasses of wine per week    Comment: Wine daily  . Drug use:  No  . Sexual activity: Not Currently  Lifestyle  . Physical activity:    Days per week: Not on file    Minutes per session: Not on file  . Stress: Not on file  Relationships  . Social connections:    Talks on phone: Not on file    Gets together: Not on file    Attends religious service: Not on file    Active member of club or organization: Not on file    Attends meetings of clubs or organizations: Not on file    Relationship status: Not on file  . Intimate partner violence:    Fear of current or ex partner: Not on file    Emotionally abused: Not on file    Physically abused: Not on file    Forced sexual activity: Not on file  Other Topics Concern  . Not on file  Social History Narrative   HSG, Married 1955 widowed 2010. 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 Passed away 07-02-2008 Her son  moved to [place] (July '10). End of life care:  no extra ordinary measures: wants cardiac resusitation and mechanical ventilation short-term if needed. Does not want to be kept in a persistant vegative state or long term artifical life support.     Allergies  Allergen Reactions  . Penicillins Hives    Has patient had a PCN reaction causing immediate rash, facial/tongue/throat swelling, SOB or lightheadedness with hypotension:## yes ## Has patient had a PCN reaction causing severe rash involving mucus membranes or skin necrosis: no Has patient had a PCN reaction that required hospitalization: no Has patient had a PCN reaction occurring within the last 10 years: no If all of the above answers are "NO", then may proceed with Cephalosporin use.   . Aspirin Hives  . Adhesive [Tape] Other (See Comments)    "pulls skin off"    Family History  Problem Relation Age of Onset  . Colon cancer Father   . Hypertension Father   .  Hyperlipidemia Father   . Coronary artery disease Father   . Ulcers Father   . Heart failure Mother   . Coronary artery disease Mother   . Breast cancer Mother   . Hypertension Mother   . Hyperlipidemia Mother   . Asthma Mother   . Diabetes Neg Hx     Prior to Admission medications   Medication Sig Start Date End Date Taking? Authorizing Provider  acetaminophen (TYLENOL) 500 MG tablet Take 1,000 mg by mouth 3 (three) times daily as needed (for pain.).    Yes [provider]  allopurinol (ZYLOPRIM) 100 MG tablet  Take 1 tablet (100 mg total) by mouth daily. 09/04/17  Yes Plotnikov, Evie Lacks, MD  amLODipine (NORVASC) 5 MG tablet Take 1 tablet (5 mg total) by mouth daily. 09/04/17  Yes Plotnikov, Evie Lacks, MD  atorvastatin (LIPITOR) 40 MG tablet Take 1 tablet (40 mg total) by mouth every evening. 09/04/17  Yes Plotnikov, Evie Lacks, MD  Calcium Carbonate (CALCIUM 600 PO) Take 600 mg by mouth 2 (two) times daily.    Yes [provider]  cetirizine (ZYRTEC) 10 MG tablet Take 10 mg by mouth 2 (two) times daily.    Yes [provider]  enalapril (VASOTEC) 10 MG tablet Take 1 tablet (10 mg total) by mouth daily. 09/04/17  Yes Plotnikov, Evie Lacks, MD  Multiple Vitamin (MULTIVITAMIN WITH MINERALS) TABS tablet Take 1 tablet by mouth daily.   Yes [provider]  Multiple Vitamins-Minerals (PRESERVISION AREDS 2 PO) Take 1 tablet by mouth daily.   Yes [provider]  triamterene-hydrochlorothiazide (MAXZIDE-25) 37.5-25 MG tablet Take 1 tablet by mouth daily. 09/04/17 09/04/18 Yes Plotnikov, Evie Lacks, MD  vitamin C (ASCORBIC ACID) 500 MG tablet Take 500 mg by mouth every evening.   Yes [provider]  clopidogrel (PLAVIX) 75 MG tablet Take 1 tablet (75 mg total) by mouth daily. Patient not taking: Reported on 02/22/2018 09/04/17   Cassandria Anger, MD    Physical Exam: Vitals:   02/22/18 1715 02/22/18 1730 02/22/18 1745 02/22/18 1946  BP: (!)  150/58 (!) 144/53 (!) 150/54 (!) 173/65  Pulse: 85 86 87 (!) 101  Resp: 16 19 16 18   Temp:    98.8 F (37.1 C)  TempSrc:    Oral  SpO2: 97% 98% 95% 96%  Weight:      Height:         . General: Appears calm and comfortable and is in NAD . Eyes:  PERRL, EOMI, normal lids, iris . ENT:  grossly normal hearing, lips & tongue, mmm . Neck:  supple, no lymphadenopathy . Cardiovascular:  nL S1, S2, normal rate, reg rhythm, no murmur. [name] Respiratory:   CTA bilaterally with no wheezes/rales/rhonchi.  Normal respiratory effort. . Abdomen:  soft, NT, ND, NABS . Back:   grossly normal alignment . Skin:  no rash or lesions seen on limited exam . Musculoskeletal:  grossly normal tone BUE/BLE, good ROM, no bony abnormality or obvious joint deformity . Lower extremities:  no LE edema.  Limited foot exam with no ulcerations.  2+ distal pulses. [name] Psychiatric:  grossly normal mood and affect, speech fluent and appropriate, AOx3 . Neurologic:  CN 2-12 grossly intact, moves all extremities in coordinated fashion, sensation intact, Patellar DTRs 2+ and symmetric    Radiological Exams on Admission: Ct Angio Head W Or Wo Contrast  Result Date: 02/22/2018 CLINICAL DATA:  TIA. Transient agraphia, confusion, and right arm numbness today. EXAM: CT ANGIOGRAPHY HEAD AND NECK TECHNIQUE: Multidetector CT imaging of the head and neck was performed using the standard protocol during bolus administration of intravenous contrast. Multiplanar CT image reconstructions and MIPs were obtained to evaluate the vascular anatomy. Carotid stenosis measurements (when applicable) are obtained utilizing NASCET criteria, using the distal internal carotid diameter as the denominator. CONTRAST:  12mL ISOVUE-370 IOPAMIDOL (ISOVUE-370) INJECTION 76% COMPARISON:  Head CT 11/12/2009 and MRI/MRA 11/13/2009 FINDINGS: CT HEAD FINDINGS Brain: There is no evidence of acute infarct, intracranial hemorrhage, mass, midline shift, or extra-axial fluid  collection. Mild cerebral atrophy is within normal limits for age. Patchy to  confluent cerebral white matter hypodensities are similar to the prior CT and nonspecific but compatible with moderately advanced chronic small vessel ischemic disease. Heterogeneous hypoattenuation in the basal ganglia bilaterally is also similar to the prior CT with numerous dilated perivascular spaces seen on MRI. A left parafalcine arachnoid cyst is incidentally noted in the posterior frontal region, unchanged and of no current clinical significance. Vascular: Calcified atherosclerosis at the skull base. Skull: No fracture or focal osseous lesion. Sinuses: Mid left ethmoid air cell osteoma. Posterior left ethmoid air cell and left maxillary sinus mucous retention cysts. Clear mastoid air cells. Orbits: Bilateral cataract extraction. Review of the MIP images confirms the above findings CTA NECK FINDINGS Aortic arch: Standard 3 vessel aortic arch with extensive calcified plaque. Mild proximal left subclavian artery stenosis due to calcified plaque. Right carotid system: Patent with moderate calcified plaque about the carotid bifurcation. No evidence of significant stenosis or dissection Left carotid system: Patent with mild calcified plaque about the carotid bifurcation. No evidence of significant stenosis or dissection. Vertebral arteries: Patent and codominant without evidence of significant stenosis or dissection. Skeleton: Severe disc and moderate to severe facet degeneration at multiple levels in the cervical spine. Other neck: Thyroid asymmetry with the right lobe being larger than left and without a discrete lesion identified. Upper chest: Clear lung apices. Review of the MIP images confirms the above findings CTA HEAD FINDINGS Anterior circulation: The internal carotid arteries are patent from skull base to carotid termini with mild-to-moderate calcified plaque bilaterally not resulting in significant stenosis. ACAs and MCAs are  patent with moderate branch vessel irregularity but no evidence of proximal branch occlusion. There are mild-to-moderate distal left A1 and proximal left A2 stenoses. No aneurysm is identified. Posterior circulation: The intracranial vertebral arteries are widely patent to the basilar. Patent bilateral PICA, right AICA, and bilateral SCA origins are identified. The basilar artery is widely patent. Posterior communicating arteries are diminutive or absent. PCAs are patent with branch vessel irregularity but no significant proximal stenosis. No aneurysm is identified. Venous sinuses: Patent. Anatomic variants: None. Delayed phase: No abnormal enhancement. Review of the MIP images confirms the above findings IMPRESSION: 1. No evidence of acute intracranial abnormality. 2. Moderately advanced chronic small vessel ischemic disease. 3. No large vessel occlusion. 4. Right greater than left cervical carotid artery atherosclerosis without stenosis. 5. Intracranial atherosclerosis resulting in mild-to-moderate distal left A1 and proximal left A2 stenoses. Electronically Signed   By: Logan Bores M.D.   On: 02/22/2018 18:53   Ct Angio Neck W Or Wo Contrast  Result Date: 02/22/2018 CLINICAL DATA:  TIA. Transient agraphia, confusion, and right arm numbness today. EXAM: CT ANGIOGRAPHY HEAD AND NECK TECHNIQUE: Multidetector CT imaging of the head and neck was performed using the standard protocol during bolus administration of intravenous contrast. Multiplanar CT image reconstructions and MIPs were obtained to evaluate the vascular anatomy. Carotid stenosis measurements (when applicable) are obtained utilizing NASCET criteria, using the distal internal carotid diameter as the denominator. CONTRAST:  23mL ISOVUE-370 IOPAMIDOL (ISOVUE-370) INJECTION 76% COMPARISON:  Head CT 11/12/2009 and MRI/MRA 11/13/2009 FINDINGS: CT HEAD FINDINGS Brain: There is no evidence of acute infarct, intracranial hemorrhage, mass, midline shift, or  extra-axial fluid collection. Mild cerebral atrophy is within normal limits for age. Patchy to confluent cerebral white matter hypodensities are similar to the prior CT and nonspecific but compatible with moderately advanced chronic small vessel ischemic disease. Heterogeneous hypoattenuation in the basal ganglia bilaterally is also similar to the prior CT with  numerous dilated perivascular spaces seen on MRI. A left parafalcine arachnoid cyst is incidentally noted in the posterior frontal region, unchanged and of no current clinical significance. Vascular: Calcified atherosclerosis at the skull base. Skull: No fracture or focal osseous lesion. Sinuses: Mid left ethmoid air cell osteoma. Posterior left ethmoid air cell and left maxillary sinus mucous retention cysts. Clear mastoid air cells. Orbits: Bilateral cataract extraction. Review of the MIP images confirms the above findings CTA NECK FINDINGS Aortic arch: Standard 3 vessel aortic arch with extensive calcified plaque. Mild proximal left subclavian artery stenosis due to calcified plaque. Right carotid system: Patent with moderate calcified plaque about the carotid bifurcation. No evidence of significant stenosis or dissection Left carotid system: Patent with mild calcified plaque about the carotid bifurcation. No evidence of significant stenosis or dissection. Vertebral arteries: Patent and codominant without evidence of significant stenosis or dissection. Skeleton: Severe disc and moderate to severe facet degeneration at multiple levels in the cervical spine. Other neck: Thyroid asymmetry with the right lobe being larger than left and without a discrete lesion identified. Upper chest: Clear lung apices. Review of the MIP images confirms the above findings CTA HEAD FINDINGS Anterior circulation: The internal carotid arteries are patent from skull base to carotid termini with mild-to-moderate calcified plaque bilaterally not resulting in significant stenosis.  ACAs and MCAs are patent with moderate branch vessel irregularity but no evidence of proximal branch occlusion. There are mild-to-moderate distal left A1 and proximal left A2 stenoses. No aneurysm is identified. Posterior circulation: The intracranial vertebral arteries are widely patent to the basilar. Patent bilateral PICA, right AICA, and bilateral SCA origins are identified. The basilar artery is widely patent. Posterior communicating arteries are diminutive or absent. PCAs are patent with branch vessel irregularity but no significant proximal stenosis. No aneurysm is identified. Venous sinuses: Patent. Anatomic variants: None. Delayed phase: No abnormal enhancement. Review of the MIP images confirms the above findings IMPRESSION: 1. No evidence of acute intracranial abnormality. 2. Moderately advanced chronic small vessel ischemic disease. 3. No large vessel occlusion. 4. Right greater than left cervical carotid artery atherosclerosis without stenosis. 5. Intracranial atherosclerosis resulting in mild-to-moderate distal left A1 and proximal left A2 stenoses. Electronically Signed   By: Logan Bores M.D.   On: 02/22/2018 18:53   Mr Brain Wo Contrast  Result Date: 02/22/2018 CLINICAL DATA:  TIA. Transient agraphia, confusion, and right arm numbness today. EXAM: MRI HEAD WITHOUT CONTRAST TECHNIQUE: Multiplanar, multiecho pulse sequences of the brain and surrounding structures were obtained without intravenous contrast. COMPARISON:  Head CT 02/22/2018 and MRI 11/13/2009 FINDINGS: Brain: There is no evidence of acute infarct, intracranial hemorrhage, mass, midline shift, or extra-axial fluid collection. Mild cerebral atrophy is within normal limits for age. Patchy to confluent T2 hyperintensities in the cerebral white matter and pons are nonspecific but compatible with moderately advanced chronic small vessel ischemic disease, mildly progressed from the 2011 MRI most notably in the pons. T2 hyperintensities  throughout the basal ganglia bilaterally are unchanged from the prior MRI and appear to predominantly reflect dilated perivascular spaces. There is also mild-to-moderate chronic T2 heterogeneity in the thalami. A left parafalcine arachnoid cyst in the posterior frontal region is unchanged. Vascular: Major intracranial vascular flow voids are preserved. Skull and upper cervical spine: Unremarkable bone marrow signal. Sinuses/Orbits: Bilateral cataract extraction. Scattered small mucous retention cysts in the paranasal sinuses. Clear mastoid air cells. Other: None. IMPRESSION: 1. No acute intracranial abnormality. 2. Moderately advanced chronic small vessel ischemic disease. Electronically Signed  By: Logan Bores M.D.   On: 02/22/2018 19:39    EKG: Independently reviewed.  Rate 95, sinus rhythm with APC, no sig change from prior   Labs on Admission: I have personally reviewed the available labs and imaging studies at the time of the admission.  Pertinent labs:  Na 131, K 3.3, Cl 94, CO2 26, BUN 20, creat 0.95, gluc 143 WBC 8 TSH 1.235 UA large leuks, >50 WBC, in clumps    Assessment/Plan Principal Problem:   TIA (transient ischemic attack) Active Problems:   Dyslipidemia   Essential hypertension   PAD (peripheral artery disease) ([facility])   Acute lower UTI   Transient unilateral hand weakness/numbness and agraphia/confusion concerning for TIA. Deficits now resolved. No h/o AF. RFs include HTN, HLD. Has h/o elevated gluc, no h/o DM. Is on plavix. -admit to obs, tele -freq neuro checks -plavix 75 mg daily -normotensive goals -MRI brain, CTA head/neck -TTE -lipid panel, HbA1C -OK for diet per neuro -no need for PT/OT/ST as deficits resolved -appreciate neuro assistance  HTN: normotensive goal -cont home enalapril, amlodipine, maxzide  HLD: -cont statin -FLP In a.m.  UTI, acute lower -rocephin 1 g IV daily -f/u UCx, tailor abx as indicated    DVT prophylaxis: lovenox Code  Status:  Full - confirmed with patient/family  Disposition Plan:  Home once clinically improved Consults called: Neuro  Admission status: It is my clinical opinion that referral for OBSERVATION is reasonable and necessary in this patient based on the above information provided. The aforementioned taken together are felt to place the patient at high risk for further clinical deterioration. However it is anticipated that the patient may be medically stable for discharge from the hospital within 24 to 48 hours.     Janora Norlander MD [facility]  If note is complete, please contact covering daytime or nighttime physician. [email or web address] Password [facility]  02/22/2018, 9:37 PM

## 2018-02-22 NOTE — Consult Note (Addendum)
Referring Physician: ER     Reason for Consult: TIA  HPI: Gina [name] is an 85 y.o. female PMH of TIA, PAD, HTN, HLD, GIB, macular degeneration. Baseline she is independent. mRS 0.  Today she comes to the ER for TIA symptoms that lasted about 61min. She tell me she was sitting at her computer and got very confused, couldn't remember her password or read the screen (d/t agraphia, not blurred vision).  At the same time she also noticed Right hand numbness at the time.This lasted x 30 min and resolved when EMS arrived. Currently she is back to baseline, but states this was similer to previous TIA in 2011, but at that time she states she had a clear aphasia. ABCD2= 4 (scoring agraphia as part of a speech deficit). She will need stroke work up.     Past Medical History Past Medical History:  Diagnosis Date  . Allergic rhinitis    seasonal  . Arthritis   . Colon cancer [facility]) Dec 12  . Diverticulosis of colon (without mention of hemorrhage)   . GI bleed    had cyst removed from spine....went back on plavix to soon  . Gout   . Hyperlipidemia   . Hyperplastic colon polyp   . Hypertension   . Macular degeneration   . PAD (peripheral artery disease) ([place])   . Pneumonia    back in 05/2017  . Stroke [facility])    TIA  10/2009  . Unspecified transient cerebral ischemia sept '11    Surgical History Past Surgical History:  Procedure Laterality Date  . ABDOMINAL AORTOGRAM N/A 06/16/2017   Procedure: ABDOMINAL AORTOGRAM;  Surgeon: Angelia Mould, MD;  Location: [place] [facility];  Service: Cardiovascular;  Laterality: N/A;  . APPENDECTOMY    . BACK SURGERY  April '12   benign cyst spine ([place])  . BREAST LUMPECTOMY     right breast '86  . COLON SURGERY  Dec '12   colectomy for colon cancer ([place])  . ENDARTERECTOMY FEMORAL Bilateral 08/02/2017  . ENDARTERECTOMY FEMORAL Bilateral 08/02/2017   Procedure: Bilateral Femoral ENDARTERECTOMY with Patch Angioplasty;  Surgeon: Rosetta Posner, MD;  Location: [place];  Service: Vascular;  Laterality: Bilateral;  . EYE SURGERY     bilateral cataracts  . LOWER EXTREMITY ANGIOGRAPHY N/A 06/16/2017   Procedure: LOWER EXTREMITY ANGIOGRAPHY;  Surgeon: Angelia Mould, MD;  Location: [place] [facility];  Service: Cardiovascular;  Laterality: N/A;  . PERIPHERAL VASCULAR INTERVENTION  06/16/2017   Procedure: PERIPHERAL VASCULAR INTERVENTION;  Surgeon: Angelia Mould, MD;  Location: [place] [facility];  Service: Cardiovascular;;  left common iliac  . TONSILLECTOMY      Family History  Family History  Problem Relation Age of Onset  . Colon cancer Father   . Hypertension Father   . Hyperlipidemia Father   . Coronary artery disease Father   . Ulcers Father   . Heart failure Mother   . Coronary artery disease Mother   . Breast cancer Mother   . Hypertension Mother   . Hyperlipidemia Mother   . Asthma Mother   . Diabetes Neg Hx     Social History:   reports that she quit smoking about 30 years ago. Her smoking use included cigarettes. She has a 45.00 pack-year smoking history. She has never used smokeless tobacco. She reports current alcohol use of about 14.0 standard drinks of alcohol per week. She reports that she does not use drugs.  Allergies:  Allergies  Allergen Reactions  . Penicillins Hives    Has patient had a PCN reaction causing immediate rash, facial/tongue/throat swelling, SOB or lightheadedness with hypotension:## yes ## Has patient had a PCN reaction causing severe rash involving mucus membranes or skin necrosis: no Has patient had a PCN reaction that required hospitalization: no Has patient had a PCN reaction occurring within the last 10 years: no If all of the above answers are "NO", then may proceed with Cephalosporin use.   . Aspirin Hives  . Adhesive [Tape] Other (See Comments)    "pulls skin off"    Home Medications:  (Not in a hospital admission)   Hospital Medications   ROS:  History  obtained from pt  General ROS: negative for - chills, fatigue, fever, night sweats, weight gain or weight loss Psychological ROS: negative for - behavioral disorder, hallucinations, memory difficulties, mood swings or suicidal ideation Ophthalmic ROS: negative for - blurry vision, double vision, eye pain or loss of vision ENT ROS: negative for - epistaxis, nasal discharge, oral lesions, sore throat, tinnitus or vertigo Allergy and Immunology ROS: negative for - hives or itchy/watery eyes Hematological and Lymphatic ROS: negative for - bleeding problems, bruising or swollen lymph nodes Endocrine ROS: negative for - galactorrhea, hair pattern changes, polydipsia/polyuria or temperature intolerance Respiratory ROS: negative for - cough, hemoptysis, shortness of breath or wheezing Cardiovascular ROS: negative for - chest pain, dyspnea on exertion, edema or irregular heartbeat Gastrointestinal ROS: negative for - abdominal pain, diarrhea, hematemesis, nausea/vomiting or stool incontinence Genito-Urinary ROS: negative for - dysuria, hematuria, incontinence or urinary frequency/urgency Musculoskeletal ROS: negative for - joint swelling or muscular weakness Neurological ROS: as noted in HPI Dermatological ROS: negative for rash and skin lesion changes   Physical Examination:  Vitals:   02/22/18 1500 02/22/18 1515 02/22/18 1530 02/22/18 1545  BP: (!) 141/51 (!) 152/57 (!) 143/59 (!) 135/53  Pulse: 87 92 92 97  Resp: 19 20 20 19   Temp:      TempSrc:      SpO2: 100% 100% 95% 100%  Weight:      Height:        General - no apparent distress. Appears younger than age Heart - Regular rate and rhythm - no murmer appreciated Lungs - Clear to auscultation  Abdomen - Soft - non tender Extremities - Distal pulses intact - no edema Skin - Warm and dry   Neurologic Examination:  Mental Status: Alert, oriented, thought content appropriate.  Speech fluent without evidence of aphasia. Able to follow  3 step commands without difficulty. Cranial Nerves: II: Discs not visualized; Visual fields grossly normal, pupils equal, round, reactive to light III,IV, VI: ptosis not present, extra-ocular motions intact bilaterally V,VII: smile symmetric, facial light touch sensation normal bilaterally VIII: hearing normal bilaterally IX,X: gag reflex present XI: bilateral shoulder shrug XII: midline tongue extension Motor: RUE - 5/5    LUE - 5/5   RLE - 5/5    LLE - 5/5 Tone and bulk:normal tone throughout; no atrophy noted Sensory: Light touch intact throughout, bilaterally Deep Tendon Reflexes: 2+ and symmetric throughout Plantars: Right: downgoing   Left: downgoing Cerebellar: normal finger-to-nose and normal heel-to-shin test Gait: deferred at this time.   NIHSS 1a Level of Conscious:0 1b LOC Questions: 0 1c LOC Commands: 0 2 Best Gaze: 0 3 Visual: 0 4 Facial Palsy: 0 5a Motor Arm - left: 0 5b Motor Arm - Right: 0 6a Motor Leg - Left: 0 6b Motor Leg -  Right: 0 7 Limb Ataxia: 0 8 Sensory: 0 9 Best Language: 0 10 Dysarthria:0 11 Extinct. and Inattention:0 TOTAL: 0   LABORATORY STUDIES:  Basic Metabolic Panel: Recent Labs  Lab 02/22/18 1516  NA 131*  K 3.3*  CL 94*  CO2 26  GLUCOSE 143*  BUN 20  CREATININE 0.95  CALCIUM 9.5    Liver Function Tests: Recent Labs  Lab 02/22/18 1516  AST 21  ALT 18  ALKPHOS 55  BILITOT 0.8  PROT 6.2*  ALBUMIN 3.4*   No results for input(s): LIPASE, AMYLASE in the last 168 hours. No results for input(s): AMMONIA in the last 168 hours.  CBC: Recent Labs  Lab 02/22/18 1516  WBC 8.0  HGB 12.1  HCT 37.6  MCV 100.3*  PLT 198    Cardiac Enzymes: No results for input(s): CKTOTAL, CKMB, CKMBINDEX, TROPONINI in the last 168 hours.  BNP: Invalid input(s): POCBNP  CBG: No results for input(s): GLUCAP in the last 168 hours.  Microbiology:   Coagulation Studies: No results for input(s): LABPROT, INR in the last 72  hours.  Urinalysis:  Recent Labs  Lab 02/22/18 1516  COLORURINE YELLOW  LABSPEC 1.010  PHURINE 7.0  GLUCOSEU NEGATIVE  HGBUR NEGATIVE  BILIRUBINUR NEGATIVE  KETONESUR NEGATIVE  PROTEINUR NEGATIVE  NITRITE NEGATIVE  LEUKOCYTESUR LARGE*    Lipid Panel:     Component Value Date/Time   CHOL 157 11/20/2017 0841   TRIG 113.0 11/20/2017 0841   TRIG 192 (H) 01/12/2006 0745   HDL 77.30 11/20/2017 0841   CHOLHDL 2 11/20/2017 0841   VLDL 22.6 11/20/2017 0841   LDLCALC 57 11/20/2017 0841    HgbA1C:  Lab Results  Component Value Date   HGBA1C 5.8 02/18/2016    Urine Drug Screen:  No results found for: LABOPIA, COCAINSCRNUR, LABBENZ, AMPHETMU, THCU, LABBARB   Alcohol Level:  No results for input(s): ETH in the last 168 hours.  Miscellaneous labs:  EKG  EKG   IMAGING: No results found.    Assessment: 85 y.o. female with sudden onset of agraphia, confusion and right arm numbness lasting 27min or so.  Stroke Risk Factors - prev TIA (has been on Plavix for this daily), HTN, HLD  # Transient neuro symptoms as above- will MRI to r/o small vessel stroke # h/o TIA- with semiler sx # HTN- normotensive for now # HLD- ck lipids  Plan:  HgbA1c, fasting lipid panel  MRI brain  CT Angiogram Head and Nec  No need currently for PT consult, OT consult, Speech consult as there are no longer any deficits  Echocardiogram  Prophylactic therapy - Antiplatelet medication(s)  Normotensive goals  Statin Therapy - LDL goal < 70  Risk factor modification  Telemetry monitoring  Frequent neuro checks  Fall Precautions  DVT prophelaxis  Clear for PO based upon my exam and no further deficts    Desiree Metzger-Cihelka, ARNP-C, ANVP-BC  Attending Neurologist's note to follow   NEUROHOSPITALIST ADDENDUM Performed a face to face diagnostic evaluation.   I have reviewed the contents of history and physical exam as documented by PA/ARNP/Resident and agree with above  documentation.  I have discussed and formulated the above plan as documented. Edits to the note have been made as needed.  Impression: 71 y F with PMH of TIA on plavix presents with 30 min epeisode of right side weakness, numbness. Risk factors include HTN and HLD.  MRI brain reviewed: negative. CTA shows mild moderate atherosclerosis.   Key exam findings: No  residual weakness. 5/5 strength in both UE. Normal sensation. No facial droop.   Plan: Continue Plavix,Allergic to ASA to do DAPT.  Echo pending. 30 day holter on discharge to screen for pAfib.    Karena Addison Aroor MD [facility] [phone]   If 7pm to 7am, please call on call as listed on AMION.

## 2018-02-22 NOTE — ED Provider Notes (Signed)
[facility] EMERGENCY DEPARTMENT Provider Note   CSN: [id number] Arrival date & time: 02/22/18  1414     History   Chief Complaint Chief Complaint  Patient presents with  . Transient Ischemic Attack    HPI Gina [name] is a 85 y.o. female.  The history is provided by the patient, medical records and a relative. No language interpreter was used.  Neurologic Problem  This is a recurrent problem. The current episode started 1 to 2 hours ago. The problem occurs rarely. The problem has been resolved. Pertinent negatives include no chest pain, no abdominal pain, no headaches and no shortness of breath. Nothing aggravates the symptoms. Nothing relieves the symptoms. She has tried nothing for the symptoms. The treatment provided no relief.    Past Medical History:  Diagnosis Date  . Allergic rhinitis    seasonal  . Arthritis   . Colon cancer [facility]) Dec 12  . Diverticulosis of colon (without mention of hemorrhage)   . GI bleed    had cyst removed from spine....went back on plavix to soon  . Gout   . Hyperlipidemia   . Hyperplastic colon polyp   . Hypertension   . Macular degeneration   . PAD (peripheral artery disease) ([facility])   . Pneumonia    back in 05/2017  . Stroke [facility])    TIA  10/2009  . Unspecified transient cerebral ischemia sept '11    Patient Active Problem List   Diagnosis Date Noted  . Bruising 11/20/2017  . Ataxia 11/20/2017  . Diarrhea 09/12/2017  . Acute chest wall pain 06/14/2017  . Acute bronchitis 05/25/2017  . Abdominal distention 10/04/2016  . Elevated glucose 02/18/2016  . PAD (peripheral artery disease) ([place]) 03/17/2015  . Dysuria 05/17/2014  . Travel advice encounter 03/18/2014  . Osteopenia 03/18/2014  . Well adult exam 09/07/2013  . Routine health maintenance 09/04/2011  . Left otitis media 03/31/2011  . Vertigo 03/31/2011  . Cancer of right colon ([place]) 01/17/2011  . Back pain 10/25/2010  . Unspecified transient  cerebral ischemia 11/17/2009  . HIP PAIN, LEFT 07/22/2009  . Diverticulosis 04/24/2008  . COLONIC POLYPS, HYPERPLASTIC, HX OF 04/24/2008  . Dyslipidemia 09/08/2006  . Gout 09/08/2006  . Essential hypertension 09/08/2006  . ALLERGIC RHINITIS 09/08/2006    Past Surgical History:  Procedure Laterality Date  . ABDOMINAL AORTOGRAM N/A 06/16/2017   Procedure: ABDOMINAL AORTOGRAM;  Surgeon: Angelia Mould, MD;  Location: [facility];  Service: Cardiovascular;  Laterality: N/A;  . APPENDECTOMY    . BACK SURGERY  April '12   benign cyst spine ([place])  . BREAST LUMPECTOMY     right breast '86  . COLON SURGERY  Dec '12   colectomy for colon cancer ([place])  . ENDARTERECTOMY FEMORAL Bilateral 08/02/2017  . ENDARTERECTOMY FEMORAL Bilateral 08/02/2017   Procedure: Bilateral Femoral ENDARTERECTOMY with Patch Angioplasty;  Surgeon: Rosetta Posner, MD;  Location: [facility];  Service: Vascular;  Laterality: Bilateral;  . EYE SURGERY     bilateral cataracts  . LOWER EXTREMITY ANGIOGRAPHY N/A 06/16/2017   Procedure: LOWER EXTREMITY ANGIOGRAPHY;  Surgeon: Angelia Mould, MD;  Location: [facility];  Service: Cardiovascular;  Laterality: N/A;  . PERIPHERAL VASCULAR INTERVENTION  06/16/2017   Procedure: PERIPHERAL VASCULAR INTERVENTION;  Surgeon: Angelia Mould, MD;  Location: [facility];  Service: Cardiovascular;;  left common iliac  . TONSILLECTOMY       OB History   No obstetric history  on file.      Home Medications    Prior to Admission medications   Medication Sig Start Date End Date Taking? Authorizing Provider  acetaminophen (TYLENOL) 500 MG tablet Take 500-1,000 mg by mouth 3 (three) times daily as needed (for pain.).     [provider]  allopurinol (ZYLOPRIM) 100 MG tablet Take 1 tablet (100 mg total) by mouth daily. 09/04/17   Plotnikov, Evie Lacks, MD  amLODipine (NORVASC) 5 MG tablet Take 1 tablet (5 mg total) by mouth daily. 09/04/17    Plotnikov, Evie Lacks, MD  atorvastatin (LIPITOR) 40 MG tablet Take 1 tablet (40 mg total) by mouth every evening. 09/04/17   Plotnikov, Evie Lacks, MD  Calcium Carbonate (CALCIUM 600 PO) Take 600 mg by mouth 2 (two) times daily.     [provider]  cetirizine (ZYRTEC) 10 MG tablet Take 10 mg by mouth daily.    [provider]  clopidogrel (PLAVIX) 75 MG tablet Take 1 tablet (75 mg total) by mouth daily. 09/04/17   Plotnikov, Evie Lacks, MD  enalapril (VASOTEC) 10 MG tablet Take 1 tablet (10 mg total) by mouth daily. 09/04/17   Plotnikov, Evie Lacks, MD  Multiple Vitamin (MULTIVITAMIN WITH MINERALS) TABS tablet Take 1 tablet by mouth daily.    [provider]  Multiple Vitamins-Minerals (PRESERVISION AREDS 2 PO) Take 1 tablet by mouth daily.    [provider]  triamterene-hydrochlorothiazide (MAXZIDE-25) 37.5-25 MG tablet Take 1 tablet by mouth daily. 09/04/17 09/04/18  Plotnikov, Evie Lacks, MD  vitamin C (ASCORBIC ACID) 500 MG tablet Take 500 mg by mouth every evening.    [provider]    Family History Family History  Problem Relation Age of Onset  . Colon cancer Father   . Hypertension Father   . Hyperlipidemia Father   . Coronary artery disease Father   . Ulcers Father   . Heart failure Mother   . Coronary artery disease Mother   . Breast cancer Mother   . Hypertension Mother   . Hyperlipidemia Mother   . Asthma Mother   . Diabetes Neg Hx     Social History Social History   Tobacco Use  . Smoking status: Former Smoker    Packs/day: 1.50    Years: 30.00    Pack years: 45.00    Types: Cigarettes    Last attempt to quit: 07/21/1987    Years since quitting: 30.6  . Smokeless tobacco: Never Used  Substance Use Topics  . Alcohol use: Yes    Alcohol/week: 14.0 standard drinks    Types: 14 Glasses of wine per week    Comment: Wine daily  . Drug use: No     Allergies   Penicillins; Aspirin; and Adhesive [tape]   Review of  Systems Review of Systems  Constitutional: Negative for chills, diaphoresis, fatigue and fever.  HENT: Negative for congestion.   Eyes: Negative for photophobia and visual disturbance.  Respiratory: Negative for cough, chest tightness and shortness of breath.   Cardiovascular: Negative for chest pain, palpitations and leg swelling.  Gastrointestinal: Negative for abdominal pain.  Genitourinary: Negative for dysuria and frequency.  Musculoskeletal: Negative for back pain, neck pain and neck stiffness.  Skin: Negative for rash and wound.  Neurological: Positive for numbness. Negative for dizziness, speech difficulty, weakness, light-headedness and headaches.  Psychiatric/Behavioral: Positive for confusion. Negative for agitation.  All other systems reviewed and are negative.    Physical Exam Updated Vital Signs BP (!) 134/56  Pulse 94   Temp 98 F (36.7 C) (Oral)   Resp 20   Ht 5' (1.524 m)   Wt 81.6 kg   SpO2 98%   BMI 35.15 kg/m   Physical Exam Vitals signs and nursing note reviewed.  Constitutional:      General: She is not in acute distress.    Appearance: She is well-developed. She is not toxic-appearing or diaphoretic.  HENT:     Head: Normocephalic and atraumatic.     Nose: No congestion or rhinorrhea.     Mouth/Throat:     Pharynx: No oropharyngeal exudate.  Eyes:     Conjunctiva/sclera: Conjunctivae normal.  Neck:     Musculoskeletal: Normal range of motion and neck supple.  Cardiovascular:     Rate and Rhythm: Normal rate and regular rhythm.     Heart sounds: No murmur.  Pulmonary:     Effort: Pulmonary effort is normal. No respiratory distress.     Breath sounds: Normal breath sounds. No wheezing, rhonchi or rales.  Chest:     Chest wall: No tenderness.  Abdominal:     Palpations: Abdomen is soft.     Tenderness: There is no abdominal tenderness.  Skin:    General: Skin is warm and dry.  Neurological:     General: No focal deficit present.      Mental Status: She is alert and oriented to person, place, and time.     GCS: GCS eye subscore is 4. GCS verbal subscore is 5. GCS motor subscore is 6.     Cranial Nerves: No cranial nerve deficit.     Sensory: Sensation is intact. No sensory deficit.     Motor: No weakness or abnormal muscle tone.     Deep Tendon Reflexes: Reflexes normal.     Comments: No focal neurologic deficits on my initial exam.  Normal finger-nose-finger testing, grasping, and sensation in all extremities.  No facial droop, clear speech, and eye exam was unremarkable with normal extraocular movements and pupil exam.  Psychiatric:        Mood and Affect: Mood normal.      ED Treatments / Results  Labs (all labs ordered are listed, but only abnormal results are displayed) Labs Reviewed  CBC - Abnormal; Notable for the following components:      Result Value   RBC 3.75 (*)    MCV 100.3 (*)    All other components within normal limits  COMPREHENSIVE METABOLIC PANEL - Abnormal; Notable for the following components:   Sodium 131 (*)    Potassium 3.3 (*)    Chloride 94 (*)    Glucose, Bld 143 (*)    Total Protein 6.2 (*)    Albumin 3.4 (*)    GFR calc non Af Amer 55 (*)    All other components within normal limits  URINALYSIS, ROUTINE W REFLEX MICROSCOPIC - Abnormal; Notable for the following components:   APPearance CLOUDY (*)    Leukocytes, UA LARGE (*)    WBC, UA >50 (*)    Bacteria, UA RARE (*)    All other components within normal limits  TSH  HEMOGLOBIN A1C  LIPID PANEL    EKG EKG Interpretation  Date/Time:  Thursday February 22 2018 14:18:07 EST Ventricular Rate:  95 PR Interval:    QRS Duration: 92 QT Interval:  352 QTC Calculation: 443 R Axis:   65 Text Interpretation:  Sinus rhythm Atrial premature complex When compared to prior, no significant cahnges seen.  No STEMI Confirmed by Antony Blackbird [phone]) on 02/22/2018 3:01:52 PM   Radiology Ct Angio Head W Or Wo Contrast  Result Date:  02/22/2018 CLINICAL DATA:  TIA. Transient agraphia, confusion, and right arm numbness today. EXAM: CT ANGIOGRAPHY HEAD AND NECK TECHNIQUE: Multidetector CT imaging of the head and neck was performed using the standard protocol during bolus administration of intravenous contrast. Multiplanar CT image reconstructions and MIPs were obtained to evaluate the vascular anatomy. Carotid stenosis measurements (when applicable) are obtained utilizing NASCET criteria, using the distal internal carotid diameter as the denominator. CONTRAST:  60mL ISOVUE-370 IOPAMIDOL (ISOVUE-370) INJECTION 76% COMPARISON:  Head CT 11/12/2009 and MRI/MRA 11/13/2009 FINDINGS: CT HEAD FINDINGS Brain: There is no evidence of acute infarct, intracranial hemorrhage, mass, midline shift, or extra-axial fluid collection. Mild cerebral atrophy is within normal limits for age. Patchy to confluent cerebral white matter hypodensities are similar to the prior CT and nonspecific but compatible with moderately advanced chronic small vessel ischemic disease. Heterogeneous hypoattenuation in the basal ganglia bilaterally is also similar to the prior CT with numerous dilated perivascular spaces seen on MRI. A left parafalcine arachnoid cyst is incidentally noted in the posterior frontal region, unchanged and of no current clinical significance. Vascular: Calcified atherosclerosis at the skull base. Skull: No fracture or focal osseous lesion. Sinuses: Mid left ethmoid air cell osteoma. Posterior left ethmoid air cell and left maxillary sinus mucous retention cysts. Clear mastoid air cells. Orbits: Bilateral cataract extraction. Review of the MIP images confirms the above findings CTA NECK FINDINGS Aortic arch: Standard 3 vessel aortic arch with extensive calcified plaque. Mild proximal left subclavian artery stenosis due to calcified plaque. Right carotid system: Patent with moderate calcified plaque about the carotid bifurcation. No evidence of significant  stenosis or dissection Left carotid system: Patent with mild calcified plaque about the carotid bifurcation. No evidence of significant stenosis or dissection. Vertebral arteries: Patent and codominant without evidence of significant stenosis or dissection. Skeleton: Severe disc and moderate to severe facet degeneration at multiple levels in the cervical spine. Other neck: Thyroid asymmetry with the right lobe being larger than left and without a discrete lesion identified. Upper chest: Clear lung apices. Review of the MIP images confirms the above findings CTA HEAD FINDINGS Anterior circulation: The internal carotid arteries are patent from skull base to carotid termini with mild-to-moderate calcified plaque bilaterally not resulting in significant stenosis. ACAs and MCAs are patent with moderate branch vessel irregularity but no evidence of proximal branch occlusion. There are mild-to-moderate distal left A1 and proximal left A2 stenoses. No aneurysm is identified. Posterior circulation: The intracranial vertebral arteries are widely patent to the basilar. Patent bilateral PICA, right AICA, and bilateral SCA origins are identified. The basilar artery is widely patent. Posterior communicating arteries are diminutive or absent. PCAs are patent with branch vessel irregularity but no significant proximal stenosis. No aneurysm is identified. Venous sinuses: Patent. Anatomic variants: None. Delayed phase: No abnormal enhancement. Review of the MIP images confirms the above findings IMPRESSION: 1. No evidence of acute intracranial abnormality. 2. Moderately advanced chronic small vessel ischemic disease. 3. No large vessel occlusion. 4. Right greater than left cervical carotid artery atherosclerosis without stenosis. 5. Intracranial atherosclerosis resulting in mild-to-moderate distal left A1 and proximal left A2 stenoses. Electronically Signed   By: Logan Bores M.D.   On: 02/22/2018 18:53   Ct Angio Neck W Or Wo  Contrast  Result Date: 02/22/2018 CLINICAL DATA:  TIA. Transient agraphia, confusion, and right arm numbness today.  EXAM: CT ANGIOGRAPHY HEAD AND NECK TECHNIQUE: Multidetector CT imaging of the head and neck was performed using the standard protocol during bolus administration of intravenous contrast. Multiplanar CT image reconstructions and MIPs were obtained to evaluate the vascular anatomy. Carotid stenosis measurements (when applicable) are obtained utilizing NASCET criteria, using the distal internal carotid diameter as the denominator. CONTRAST:  2mL ISOVUE-370 IOPAMIDOL (ISOVUE-370) INJECTION 76% COMPARISON:  Head CT 11/12/2009 and MRI/MRA 11/13/2009 FINDINGS: CT HEAD FINDINGS Brain: There is no evidence of acute infarct, intracranial hemorrhage, mass, midline shift, or extra-axial fluid collection. Mild cerebral atrophy is within normal limits for age. Patchy to confluent cerebral white matter hypodensities are similar to the prior CT and nonspecific but compatible with moderately advanced chronic small vessel ischemic disease. Heterogeneous hypoattenuation in the basal ganglia bilaterally is also similar to the prior CT with numerous dilated perivascular spaces seen on MRI. A left parafalcine arachnoid cyst is incidentally noted in the posterior frontal region, unchanged and of no current clinical significance. Vascular: Calcified atherosclerosis at the skull base. Skull: No fracture or focal osseous lesion. Sinuses: Mid left ethmoid air cell osteoma. Posterior left ethmoid air cell and left maxillary sinus mucous retention cysts. Clear mastoid air cells. Orbits: Bilateral cataract extraction. Review of the MIP images confirms the above findings CTA NECK FINDINGS Aortic arch: Standard 3 vessel aortic arch with extensive calcified plaque. Mild proximal left subclavian artery stenosis due to calcified plaque. Right carotid system: Patent with moderate calcified plaque about the carotid bifurcation. No  evidence of significant stenosis or dissection Left carotid system: Patent with mild calcified plaque about the carotid bifurcation. No evidence of significant stenosis or dissection. Vertebral arteries: Patent and codominant without evidence of significant stenosis or dissection. Skeleton: Severe disc and moderate to severe facet degeneration at multiple levels in the cervical spine. Other neck: Thyroid asymmetry with the right lobe being larger than left and without a discrete lesion identified. Upper chest: Clear lung apices. Review of the MIP images confirms the above findings CTA HEAD FINDINGS Anterior circulation: The internal carotid arteries are patent from skull base to carotid termini with mild-to-moderate calcified plaque bilaterally not resulting in significant stenosis. ACAs and MCAs are patent with moderate branch vessel irregularity but no evidence of proximal branch occlusion. There are mild-to-moderate distal left A1 and proximal left A2 stenoses. No aneurysm is identified. Posterior circulation: The intracranial vertebral arteries are widely patent to the basilar. Patent bilateral PICA, right AICA, and bilateral SCA origins are identified. The basilar artery is widely patent. Posterior communicating arteries are diminutive or absent. PCAs are patent with branch vessel irregularity but no significant proximal stenosis. No aneurysm is identified. Venous sinuses: Patent. Anatomic variants: None. Delayed phase: No abnormal enhancement. Review of the MIP images confirms the above findings IMPRESSION: 1. No evidence of acute intracranial abnormality. 2. Moderately advanced chronic small vessel ischemic disease. 3. No large vessel occlusion. 4. Right greater than left cervical carotid artery atherosclerosis without stenosis. 5. Intracranial atherosclerosis resulting in mild-to-moderate distal left A1 and proximal left A2 stenoses. Electronically Signed   By: Logan Bores M.D.   On: 02/22/2018 18:53     Procedures Procedures (including critical care time)  Medications Ordered in ED Medications  iopamidol (ISOVUE-370) 76 % injection (has no administration in time range)  allopurinol (ZYLOPRIM) tablet 100 mg (has no administration in time range)  atorvastatin (LIPITOR) tablet 40 mg (has no administration in time range)  calcium carbonate (OSCAL) tablet 600 mg (has no administration in time  range)  multivitamin with minerals tablet 1 tablet (has no administration in time range)  PRESERVISION AREDS 2 CAPS (has no administration in time range)  vitamin C (ASCORBIC ACID) tablet 500 mg (has no administration in time range)   stroke: mapping our early stages of recovery book (has no administration in time range)  0.9 %  sodium chloride infusion (has no administration in time range)  acetaminophen (TYLENOL) tablet 650 mg (has no administration in time range)    Or  acetaminophen (TYLENOL) solution 650 mg (has no administration in time range)    Or  acetaminophen (TYLENOL) suppository 650 mg (has no administration in time range)  enoxaparin (LOVENOX) injection 30 mg (has no administration in time range)  iopamidol (ISOVUE-370) 76 % injection 75 mL (75 mLs Intravenous Contrast Given 02/22/18 1804)     Initial Impression / Assessment and Plan / ED Course  I have reviewed the triage vital signs and the nursing notes.  Pertinent labs & imaging results that were available during my care of the patient were reviewed by me and considered in my medical decision making (see chart for details).     Gina [name] is a 85 y.o. female with a past medical history significant for prior TIA and stroke, peripheral arterial disease status post femoral endarterectomies, prior colon cancer, hyperlipidemia, hypertension, gout, diverticulosis who presents with concern for TIA.  Patient is coming by family who reports that at around 1:10 PM this afternoon, patient was trying to work on her computer when she  had onset of 2 problems, she could not remember her password and was feeling confused and she had right hand numbness.  She reports this lasted for approximately 10 to 15 minutes until EMS arrived and it began to improve.  She denies any speech difficulty, dizziness, weakness, or coordination problem.  She is unsure what her previous symptoms were from stroke but she thought it was more a facial last time.  Family concerned she may have had another TIA.  She denies any other symptoms recently aside from her chronic back pains.  No recent fevers, chills, headache, neck pain, neck stiffness, vision changes.  No other symptoms.  On exam, patient has no focal neurologic deficits on muscle exam.  Pupils are symmetric and reactive bilaterally.  Normal extraocular movements.  Neck is nontender.  Chest is nontender.  No murmur.  Abdomen nontender.  Legs have some mild edema but otherwise are well-appearing.  Normal sensation and pulses.  Clinical aspect patient had another TIA.  No evidence of trauma.  Patient will have screen laboratory testing and will touch base with neurology.  Will defer to them imaging.  Suspect she will need MRI.  Given lack of headache or ongoing symptoms, will hold on code stroke or CT head at this time.  6:24 PM Neurology saw the patient and request she be admitted for TIA/stroke work-up.  They ordered CTA, CT head, and MRI.  Hospitalist will be called for admission for further management of TIA.  Final Clinical Impressions(s) / ED Diagnoses   Final diagnoses:  TIA (transient ischemic attack)    ED Discharge Orders    None     Clinical Impression: 1. TIA (transient ischemic attack)     Disposition: Admit  This note was prepared with assistance of Dragon voice recognition software. Occasional wrong-word or sound-a-like substitutions may have occurred due to the inherent limitations of voice recognition software.     Tegeler, Gwenyth Allegra, MD 02/22/18 [phone]

## 2018-02-22 NOTE — ED Triage Notes (Signed)
Pt in from home via [facility], LSN 1310. States she was sitting at her computer and got very confused, couldn't remember her password. Also had R hand numbness at the time. This lasted x 30 min and resolved when EMS arrived. Pt is a&ox4, no other neuro symptoms. Hx of same 8 years ago

## 2018-02-22 NOTE — ED Notes (Signed)
Report given to [facility] RN. All questions answered

## 2018-02-22 NOTE — ED Notes (Signed)
Patient transported to CT 

## 2018-02-23 ENCOUNTER — Ambulatory Visit (HOSPITAL_BASED_OUTPATIENT_CLINIC_OR_DEPARTMENT_OTHER): Payer: Medicare Other

## 2018-02-23 ENCOUNTER — Observation Stay (HOSPITAL_COMMUNITY): Payer: Medicare Other

## 2018-02-23 ENCOUNTER — Other Ambulatory Visit: Payer: Self-pay

## 2018-02-23 DIAGNOSIS — C189 Malignant neoplasm of colon, unspecified: Secondary | ICD-10-CM

## 2018-02-23 DIAGNOSIS — N390 Urinary tract infection, site not specified: Secondary | ICD-10-CM | POA: Diagnosis not present

## 2018-02-23 DIAGNOSIS — G459 Transient cerebral ischemic attack, unspecified: Secondary | ICD-10-CM

## 2018-02-23 DIAGNOSIS — D539 Nutritional anemia, unspecified: Secondary | ICD-10-CM | POA: Diagnosis not present

## 2018-02-23 DIAGNOSIS — I10 Essential (primary) hypertension: Secondary | ICD-10-CM | POA: Diagnosis not present

## 2018-02-23 DIAGNOSIS — E785 Hyperlipidemia, unspecified: Secondary | ICD-10-CM | POA: Diagnosis not present

## 2018-02-23 LAB — LIPID PANEL
Cholesterol: 128 mg/dL (ref 0–200)
HDL: 79 mg/dL (ref >40)
LDL Cholesterol: 42 mg/dL (ref 0–99)
Total CHOL/HDL Ratio: 1.6 RATIO
Triglycerides: 37 mg/dL (ref <150)
VLDL: 7 mg/dL (ref 0–40)

## 2018-02-23 LAB — BASIC METABOLIC PANEL
Anion gap: 11 (ref 5–15)
BUN: 10 mg/dL (ref 8–23)
CO2: 25 mmol/L (ref 22–32)
Calcium: 8.8 mg/dL — ABNORMAL LOW (ref 8.9–10.3)
Chloride: 98 mmol/L (ref 98–111)
Creatinine, Ser: 0.72 mg/dL (ref 0.44–1.00)
GFR calc Af Amer: >60 mL/min (ref >60)
GFR calc non Af Amer: >60 mL/min (ref >60)
Glucose, Bld: 109 mg/dL — ABNORMAL HIGH (ref 70–99)
Potassium: 3.3 mmol/L — ABNORMAL LOW (ref 3.5–5.1)
Sodium: 134 mmol/L — ABNORMAL LOW (ref 135–145)

## 2018-02-23 LAB — CBC
HCT: 35 % — ABNORMAL LOW (ref 36.0–46.0)
Hemoglobin: 11.9 g/dL — ABNORMAL LOW (ref 12.0–15.0)
MCH: 34.1 pg — ABNORMAL HIGH (ref 26.0–34.0)
MCHC: 34 g/dL (ref 30.0–36.0)
MCV: 100.3 fL — ABNORMAL HIGH (ref 80.0–100.0)
Platelets: 167 10*3/uL (ref 150–400)
RBC: 3.49 MIL/uL — ABNORMAL LOW (ref 3.87–5.11)
RDW: 13.9 % (ref 11.5–15.5)
WBC: 6.9 10*3/uL (ref 4.0–10.5)
nRBC: 0 % (ref 0.0–0.2)

## 2018-02-23 LAB — MRSA PCR SCREENING: MRSA by PCR: NEGATIVE

## 2018-02-23 LAB — HEMOGLOBIN A1C
Hgb A1c MFr Bld: 5.9 % — ABNORMAL HIGH (ref 4.8–5.6)
Mean Plasma Glucose: 122.63 mg/dL

## 2018-02-23 LAB — ECHOCARDIOGRAM COMPLETE
Height: 60 in
Weight: 2880 oz

## 2018-02-23 MED ORDER — PROSIGHT PO TABS
1.0000 | ORAL_TABLET | Freq: Every day | ORAL | Status: DC
  Administered 2018-02-23: 1 via ORAL
  Filled 2018-02-23: qty 1

## 2018-02-23 MED ORDER — CIPROFLOXACIN HCL 250 MG PO TABS: 250.0000 mg | ORAL_TABLET | Freq: Two times a day (BID) | ORAL | 0 refills | Status: DC

## 2018-02-23 MED ORDER — CLOPIDOGREL BISULFATE 75 MG PO TABS: 75.0000 mg | ORAL_TABLET | Freq: Every day | ORAL | 3 refills | Status: DC

## 2018-02-23 NOTE — Evaluation (Signed)
Physical Therapy Evaluation Patient Details Name: Gina [name] MRN: [id number] DOB: 04-Jan-1932 Today's Date: 02/23/2018   History of Present Illness  Patient is a 85 y/o female who presents with right hand numbness and difficulty remembering her password. Concern for TIA. Brain MRI-unremarkable. EEG pending. PMH includes stroke, PAD, HTN, HLD, macular degneration,   Clinical Impression  Patient tolerated transfers and gait training with supervision for balance/safety. Pt reports all symptoms have resolved since admission. Pt lives alone and independent PTA. Does not drive. Reports chronic back pain which she is seeing a neurosurgeon for. Able to recall 3/3 words within a session and name items without difficulty. Reports functioning back to baseline. Education re: [employer]. Pt does not require skilled therapy services. Discharge from therapy.    Follow Up Recommendations No PT follow up    Equipment Recommendations  None recommended by PT    Recommendations for Other Services       Precautions / Restrictions Precautions Precautions: None Restrictions Weight Bearing Restrictions: No      Mobility  Bed Mobility Overal bed mobility: Needs Assistance Bed Mobility: Supine to Sit     Supine to sit: Supervision;HOB elevated     General bed mobility comments: Increased time to get to EOB, no physical assist.   Transfers Overall transfer level: Modified independent Equipment used: Rolling walker (2 wheeled) Transfers: Sit to/from Stand Sit to Stand: Supervision         General transfer comment: Supervision for safety.   Ambulation/Gait Ambulation/Gait assistance: Supervision Gait Distance (Feet): 120 Feet Assistive device: Rolling walker (2 wheeled) Gait Pattern/deviations: Step-through pattern;Decreased stride length;Trunk flexed   Gait velocity interpretation: 1.31 - 2.62 ft/sec, indicative of limited community ambulator General Gait Details: Slow, steady gait with  RW; forward flexed posture due to chronic back pain.   Stairs            Wheelchair Mobility    Modified Rankin (Stroke Patients Only) Modified Rankin (Stroke Patients Only) Pre-Morbid Rankin Score: Slight disability Modified Rankin: Slight disability     Balance Overall balance assessment: No apparent balance deficits (not formally assessed)                                           Pertinent Vitals/Pain Pain Assessment: Faces Faces Pain Scale: Hurts a little bit Pain Location: back Pain Descriptors / Indicators: Sharp;Aching Pain Intervention(s): Monitored during session;Repositioned    Home Living Family/patient expects to be discharged to:: Private residence Living Arrangements: Alone Available Help at Discharge: Family;Friend(s);Available PRN/intermittently;Neighbor Type of Home: House Home Access: Stairs to enter Entrance Stairs-Rails: Left Entrance Stairs-Number of Steps: 2 Home Layout: Able to live on main level with bedroom/bathroom;Two level Home Equipment: Walker - 2 wheels;Cane - single point;Shower seat - built in      Prior Function Level of Independence: Independent with assistive device(s)         Comments: uses RW during the night to go to the bathroom. Uses cane in the community PRN. Does not drive.     Hand Dominance   Dominant Hand: Right    Extremity/Trunk Assessment   Upper Extremity Assessment Upper Extremity Assessment: Defer to OT evaluation    Lower Extremity Assessment Lower Extremity Assessment: Overall WFL for tasks assessed    Cervical / Trunk Assessment Cervical / Trunk Assessment: Other exceptions Cervical / Trunk Exceptions: chronic back pain- recent epidural in December  which did not work well.   Communication   Communication: No difficulties  Cognition Arousal/Alertness: Awake/alert Behavior During Therapy: WFL for tasks assessed/performed Overall Cognitive Status: Within Functional Limits for  tasks assessed                                 General Comments: Able to recall 3/3 words within session, name items       General Comments      Exercises     Assessment/Plan    PT Assessment Patent does not need any further PT services  PT Problem List         PT Treatment Interventions      PT Goals (Current goals can be found in the Care Plan section)  Acute Rehab PT Goals Patient Stated Goal: to go home and see my son this weekend PT Goal Formulation: All assessment and education complete, DC therapy    Frequency     Barriers to discharge        Co-evaluation               AM-PAC PT "6 Clicks" Mobility  Outcome Measure Help needed turning from your back to your side while in a flat bed without using bedrails?: None Help needed moving from lying on your back to sitting on the side of a flat bed without using bedrails?: A Little Help needed moving to and from a bed to a chair (including a wheelchair)?: None Help needed standing up from a chair using your arms (e.g., wheelchair or bedside chair)?: None Help needed to walk in hospital room?: A Little Help needed climbing 3-5 steps with a railing? : A Little 6 Click Score: 21    End of Session Equipment Utilized During Treatment: Gait belt Activity Tolerance: Patient tolerated treatment well Patient left: in bed;with call bell/phone within reach Nurse Communication: Mobility status PT Visit Diagnosis: Pain;Difficulty in walking, not elsewhere classified (R26.2) Pain - part of body: (back)    Time: 6761-9509 PT Time Calculation (min) (ACUTE ONLY): 26 min   Charges:   PT Evaluation $PT Eval Low Complexity: 1 Low PT Treatments $Gait Training: 8-22 mins        Wray Kearns, PT, DPT Acute Rehabilitation Services Pager [phone] Office [phone]      Gina [name] 02/23/2018, 12:16 PM

## 2018-02-23 NOTE — Procedures (Signed)
ELECTROENCEPHALOGRAM REPORT   Patient: Gina [name]       Room #: 5C10C EEG No. ID: [id number] Age: 85 y.o.        Sex: female Referring Physician: Tamala Julian Report Date:  02/23/2018        Interpreting Physician: Alexis Goodell  History: Gina [name] is an 85 y.o. female with episodic confusion  Medications:  Zyloprim, Norvasc, Rocephin, Plavix, Vasotec, MVI, Maxzide, Vitamin C  Conditions of Recording:  This is a 21 channel routine scalp EEG performed with bipolar and monopolar montages arranged in accordance to the international 10/20 system of electrode placement. One channel was dedicated to EKG recording.  The patient is in the awake, drowsy and asleep states.  Description:  The waking background activity consists of a low voltage, symmetrical, fairly well organized, 10 Hz alpha activity, seen from the parieto-occipital and posterior temporal regions.  Low voltage fast activity, poorly organized, is seen anteriorly and is at times superimposed on more posterior regions.  A mixture of theta and alpha rhythms are seen from the central and temporal regions. The patient drowses with slowing to irregular, low voltage theta and beta activity.   The patient goes in to a light sleep with symmetrical sleep spindles, vertex central sharp transients and irregular slow activity.   No epileptiform activity is noted. Hyperventilation and intermittent photic stimulation were not performed.  IMPRESSION: Normal electroencephalogram, awake and asleep. There are no focal lateralizing or epileptiform features.   Alexis Goodell, MD Neurology [phone] 02/23/2018, 6:30 PM

## 2018-02-23 NOTE — Progress Notes (Signed)
EEG complete - results pending 

## 2018-02-23 NOTE — Discharge Summary (Addendum)
Gina [name], is a 85 y.o. female  DOB 1931/05/25  MRN [id number].  Admission date:  02/22/2018  Admitting Physician  Janora Norlander, MD  Discharge Date:  02/23/2018   Primary MD  Plotnikov, Evie Lacks, MD  Recommendations for primary care physician for things to follow:   Would consider checking CBC, BMP, vitamin B12, and folate  Patient needs to be set up for a Holter monitor.  Prior to discharge messages were sent to Gay Filler with cardiology here at [facility] to contact patient regarding setting her up with a monitor   Admission Diagnosis   TIA (transient ischemic attack) [G45.9]   Discharge Diagnosis   Principal Problem:   TIA (transient ischemic attack) Active Problems:   Dyslipidemia   Essential hypertension   PAD (peripheral artery disease) ([place])   Acute lower UTI      Past Medical History:  Diagnosis Date  . Allergic rhinitis    seasonal  . Arthritis   . Colon cancer [facility]) Dec 12  . Diverticulosis of colon (without mention of hemorrhage)   . GI bleed    had cyst removed from spine....went back on plavix to soon  . Gout   . Hyperlipidemia   . Hyperplastic colon polyp   . Hypertension   . Macular degeneration   . PAD (peripheral artery disease) ([place])   . Pneumonia    back in 05/2017  . Stroke [facility])    TIA  10/2009  . Unspecified transient cerebral ischemia sept '11    Past Surgical History:  Procedure Laterality Date  . ABDOMINAL AORTOGRAM N/A 06/16/2017   Procedure: ABDOMINAL AORTOGRAM;  Surgeon: Angelia Mould, MD;  Location: [place] [facility];  Service: Cardiovascular;  Laterality: N/A;  . APPENDECTOMY    . BACK SURGERY  April '12   benign cyst spine ([place])  . BREAST LUMPECTOMY     right breast '86  . COLON SURGERY  Dec '12   colectomy for colon cancer ([place])  .  ENDARTERECTOMY FEMORAL Bilateral 08/02/2017  . ENDARTERECTOMY FEMORAL Bilateral 08/02/2017   Procedure: Bilateral Femoral ENDARTERECTOMY with Patch Angioplasty;  Surgeon: Rosetta Posner, MD;  Location: [place];  Service: Vascular;  Laterality: Bilateral;  . EYE SURGERY     bilateral cataracts  . LOWER EXTREMITY ANGIOGRAPHY N/A 06/16/2017   Procedure: LOWER EXTREMITY ANGIOGRAPHY;  Surgeon: Angelia Mould, MD;  Location: [place] [facility];  Service: Cardiovascular;  Laterality: N/A;  . PERIPHERAL VASCULAR INTERVENTION  06/16/2017   Procedure: PERIPHERAL VASCULAR INTERVENTION;  Surgeon: Angelia Mould, MD;  Location: [place] [facility];  Service: Cardiovascular;;  left common iliac  . TONSILLECTOMY         HPI  from the history and physical done on the day of admission:  Gina [name] is a 85 y.o. female with medical history significant for HTN, HLD, PAD, prior TIA in 2011 on plavix, h/o colon CA, gout, diverticulosis who presented to the ED today with c/o weakness and agraphia. She was  in her usual state of health until earlier today when she was on her computer and realized she couldn't remember her password. In addition, although she had no blurred or double vision, she was unable to read the words on the screen. At that time she also noticed her R hand had become numb and weak. Symptoms began to improve after about 15 minutes and had completely resolved after 30 minutes. She reports no other neurologic symptoms, no HA, no chest pain, dyspnea, N/V/D, abd pain, fever/chills.   ED Course: VSS, neurologic exam was nL, no deficits.  CTA of the head and neck was negative code stroke was not called, but neuro was consulted who recommended admission and TIA/CVA workup.   Hospital Course:   1. Transient ischemic attack: Patient initially presented with right hand hand numbness and confusion concerning for TIA.  Deficits resolved prior to arrival to the hospital.  CT angiogram of the head  and neck showed no acute intracranial abnormality with moderately advanced small vessel disease.  Neurology recommended admission for further stroke work-up.  Labs revealed hemoglobin A1c 5.9, total cholesterol 128, HDL 79, and LDL 42.  Echo noted EF 55 to 60% with grade 1 diastolic dysfunction.  MRI did not show any acute abnormality.  There was also concern for the possibility of a seizure and patient underwent EEG that noted to be normal.  Physical therapy evaluated patient and cleared her for safety.  At discharge patient was recommended to have continue Plavix as she reported allergy to aspirin along with statin.  Furthermore, she was recommended to be set up with a Holter monitor.  Urinary tract infection: Acute.  Patient was initially given 1 g of Rocephin IV.  Then prescribed ciprofloxacin at discharge to complete course.  Hypokalemia: Patient potassium was 3.3 on day of admission. She was not noted to have received replacement.  No repeat lab work was obtained the following day.  Essential hypertension: Patient blood pressures remained normotensive during hospital stay.  She was continued on home medications of enalapril, amlodipine and Maxide.  Hyperlipidemia: She was continued on her home statin.  Macrocytic anemia: Patient's initial hemoglobin 11.9 with elevated MCV.  Recommended vitamin B12 and folate levels to be checked in outpatient setting.  History of colon cancer  Follow UP   Consults obtained: Neurology Samara Snide, MD  Discharge Condition: Good  Diet and Activity recommendation: See Discharge Instructions below  Discharge Instructions    Patient was advised that she keep her follow-up appointment with her primary care provider on 03/07/2018 as already scheduled    Discharge Medications     Allergies as of 02/23/2018      Reactions   Penicillins Hives   Has patient had a PCN reaction causing immediate rash, facial/tongue/throat swelling, SOB or lightheadedness  with hypotension:## yes ## Has patient had a PCN reaction causing severe rash involving mucus membranes or skin necrosis: no Has patient had a PCN reaction that required hospitalization: no Has patient had a PCN reaction occurring within the last 10 years: no If all of the above answers are "NO", then may proceed with Cephalosporin use.   Aspirin Hives   Adhesive [tape] Other (See Comments)   "pulls skin off"      Medication List    TAKE these medications   acetaminophen 500 MG tablet Commonly known as:  TYLENOL Take 1,000 mg by mouth 3 (three) times daily as needed (for pain.).   allopurinol 100 MG tablet Commonly known as:  ZYLOPRIM  Take 1 tablet (100 mg total) by mouth daily.   amLODipine 5 MG tablet Commonly known as:  NORVASC Take 1 tablet (5 mg total) by mouth daily.   atorvastatin 40 MG tablet Commonly known as:  LIPITOR Take 1 tablet (40 mg total) by mouth every evening.   CALCIUM 600 PO Take 600 mg by mouth 2 (two) times daily.   cetirizine 10 MG tablet Commonly known as:  ZYRTEC Take 10 mg by mouth 2 (two) times daily.   ciprofloxacin 250 MG tablet Commonly known as:  CIPRO Take 1 tablet (250 mg total) by mouth 2 (two) times daily.   clopidogrel 75 MG tablet Commonly known as:  PLAVIX Take 1 tablet (75 mg total) by mouth daily.   enalapril 10 MG tablet Commonly known as:  VASOTEC Take 1 tablet (10 mg total) by mouth daily.   multivitamin with minerals Tabs tablet Take 1 tablet by mouth daily.   PRESERVISION AREDS 2 PO Take 1 tablet by mouth daily.   triamterene-hydrochlorothiazide 37.5-25 MG tablet Commonly known as:  MAXZIDE-25 Take 1 tablet by mouth daily.   vitamin C 500 MG tablet Commonly known as:  ASCORBIC ACID Take 500 mg by mouth every evening.       Major procedures and Radiology Reports - PLEASE review detailed and final reports for all details, in brief -   2D Echo: 02/23/2018   Left ventricle cavity size was normal. Wall  thickness wasnormal. Systolic function was normal. The estimated ejectionfraction was in the range of 55% to 60%. Wall motion was normal;there were no regional wall motion abnormalities. Doppler parameters are consistent with abnormal left ventricular relaxation (grade 1 diastolic dysfunction  EEG: 02/23/2018  Impression: Normal electroencephalogram, awake and sleep.  There were no focal lateralizing or epileptiform features.  Ct Angio Head W Or Wo Contrast  Result Date: 02/22/2018 CLINICAL DATA:  TIA. Transient agraphia, confusion, and right arm numbness today. EXAM: CT ANGIOGRAPHY HEAD AND NECK TECHNIQUE: Multidetector CT imaging of the head and neck was performed using the standard protocol during bolus administration of intravenous contrast. Multiplanar CT image reconstructions and MIPs were obtained to evaluate the vascular anatomy. Carotid stenosis measurements (when applicable) are obtained utilizing NASCET criteria, using the distal internal carotid diameter as the denominator. CONTRAST:  61mL ISOVUE-370 IOPAMIDOL (ISOVUE-370) INJECTION 76% COMPARISON:  Head CT 11/12/2009 and MRI/MRA 11/13/2009 FINDINGS: CT HEAD FINDINGS Brain: There is no evidence of acute infarct, intracranial hemorrhage, mass, midline shift, or extra-axial fluid collection. Mild cerebral atrophy is within normal limits for age. Patchy to confluent cerebral white matter hypodensities are similar to the prior CT and nonspecific but compatible with moderately advanced chronic small vessel ischemic disease. Heterogeneous hypoattenuation in the basal ganglia bilaterally is also similar to the prior CT with numerous dilated perivascular spaces seen on MRI. A left parafalcine arachnoid cyst is incidentally noted in the posterior frontal region, unchanged and of no current clinical significance. Vascular: Calcified atherosclerosis at the skull base. Skull: No fracture or focal osseous lesion. Sinuses: Mid left ethmoid air cell osteoma.  Posterior left ethmoid air cell and left maxillary sinus mucous retention cysts. Clear mastoid air cells. Orbits: Bilateral cataract extraction. Review of the MIP images confirms the above findings CTA NECK FINDINGS Aortic arch: Standard 3 vessel aortic arch with extensive calcified plaque. Mild proximal left subclavian artery stenosis due to calcified plaque. Right carotid system: Patent with moderate calcified plaque about the carotid bifurcation. No evidence of significant stenosis or dissection Left carotid system:  Patent with mild calcified plaque about the carotid bifurcation. No evidence of significant stenosis or dissection. Vertebral arteries: Patent and codominant without evidence of significant stenosis or dissection. Skeleton: Severe disc and moderate to severe facet degeneration at multiple levels in the cervical spine. Other neck: Thyroid asymmetry with the right lobe being larger than left and without a discrete lesion identified. Upper chest: Clear lung apices. Review of the MIP images confirms the above findings CTA HEAD FINDINGS Anterior circulation: The internal carotid arteries are patent from skull base to carotid termini with mild-to-moderate calcified plaque bilaterally not resulting in significant stenosis. ACAs and MCAs are patent with moderate branch vessel irregularity but no evidence of proximal branch occlusion. There are mild-to-moderate distal left A1 and proximal left A2 stenoses. No aneurysm is identified. Posterior circulation: The intracranial vertebral arteries are widely patent to the basilar. Patent bilateral PICA, right AICA, and bilateral SCA origins are identified. The basilar artery is widely patent. Posterior communicating arteries are diminutive or absent. PCAs are patent with branch vessel irregularity but no significant proximal stenosis. No aneurysm is identified. Venous sinuses: Patent. Anatomic variants: None. Delayed phase: No abnormal enhancement. Review of the MIP  images confirms the above findings IMPRESSION: 1. No evidence of acute intracranial abnormality. 2. Moderately advanced chronic small vessel ischemic disease. 3. No large vessel occlusion. 4. Right greater than left cervical carotid artery atherosclerosis without stenosis. 5. Intracranial atherosclerosis resulting in mild-to-moderate distal left A1 and proximal left A2 stenoses. Electronically Signed   By: Logan Bores M.D.   On: 02/22/2018 18:53   Ct Angio Neck W Or Wo Contrast  Result Date: 02/22/2018 CLINICAL DATA:  TIA. Transient agraphia, confusion, and right arm numbness today. EXAM: CT ANGIOGRAPHY HEAD AND NECK TECHNIQUE: Multidetector CT imaging of the head and neck was performed using the standard protocol during bolus administration of intravenous contrast. Multiplanar CT image reconstructions and MIPs were obtained to evaluate the vascular anatomy. Carotid stenosis measurements (when applicable) are obtained utilizing NASCET criteria, using the distal internal carotid diameter as the denominator. CONTRAST:  76mL ISOVUE-370 IOPAMIDOL (ISOVUE-370) INJECTION 76% COMPARISON:  Head CT 11/12/2009 and MRI/MRA 11/13/2009 FINDINGS: CT HEAD FINDINGS Brain: There is no evidence of acute infarct, intracranial hemorrhage, mass, midline shift, or extra-axial fluid collection. Mild cerebral atrophy is within normal limits for age. Patchy to confluent cerebral white matter hypodensities are similar to the prior CT and nonspecific but compatible with moderately advanced chronic small vessel ischemic disease. Heterogeneous hypoattenuation in the basal ganglia bilaterally is also similar to the prior CT with numerous dilated perivascular spaces seen on MRI. A left parafalcine arachnoid cyst is incidentally noted in the posterior frontal region, unchanged and of no current clinical significance. Vascular: Calcified atherosclerosis at the skull base. Skull: No fracture or focal osseous lesion. Sinuses: Mid left ethmoid air  cell osteoma. Posterior left ethmoid air cell and left maxillary sinus mucous retention cysts. Clear mastoid air cells. Orbits: Bilateral cataract extraction. Review of the MIP images confirms the above findings CTA NECK FINDINGS Aortic arch: Standard 3 vessel aortic arch with extensive calcified plaque. Mild proximal left subclavian artery stenosis due to calcified plaque. Right carotid system: Patent with moderate calcified plaque about the carotid bifurcation. No evidence of significant stenosis or dissection Left carotid system: Patent with mild calcified plaque about the carotid bifurcation. No evidence of significant stenosis or dissection. Vertebral arteries: Patent and codominant without evidence of significant stenosis or dissection. Skeleton: Severe disc and moderate to severe facet degeneration at  multiple levels in the cervical spine. Other neck: Thyroid asymmetry with the right lobe being larger than left and without a discrete lesion identified. Upper chest: Clear lung apices. Review of the MIP images confirms the above findings CTA HEAD FINDINGS Anterior circulation: The internal carotid arteries are patent from skull base to carotid termini with mild-to-moderate calcified plaque bilaterally not resulting in significant stenosis. ACAs and MCAs are patent with moderate branch vessel irregularity but no evidence of proximal branch occlusion. There are mild-to-moderate distal left A1 and proximal left A2 stenoses. No aneurysm is identified. Posterior circulation: The intracranial vertebral arteries are widely patent to the basilar. Patent bilateral PICA, right AICA, and bilateral SCA origins are identified. The basilar artery is widely patent. Posterior communicating arteries are diminutive or absent. PCAs are patent with branch vessel irregularity but no significant proximal stenosis. No aneurysm is identified. Venous sinuses: Patent. Anatomic variants: None. Delayed phase: No abnormal enhancement.  Review of the MIP images confirms the above findings IMPRESSION: 1. No evidence of acute intracranial abnormality. 2. Moderately advanced chronic small vessel ischemic disease. 3. No large vessel occlusion. 4. Right greater than left cervical carotid artery atherosclerosis without stenosis. 5. Intracranial atherosclerosis resulting in mild-to-moderate distal left A1 and proximal left A2 stenoses. Electronically Signed   By: Logan Bores M.D.   On: 02/22/2018 18:53   Mr Brain Wo Contrast  Result Date: 02/22/2018 CLINICAL DATA:  TIA. Transient agraphia, confusion, and right arm numbness today. EXAM: MRI HEAD WITHOUT CONTRAST TECHNIQUE: Multiplanar, multiecho pulse sequences of the brain and surrounding structures were obtained without intravenous contrast. COMPARISON:  Head CT 02/22/2018 and MRI 11/13/2009 FINDINGS: Brain: There is no evidence of acute infarct, intracranial hemorrhage, mass, midline shift, or extra-axial fluid collection. Mild cerebral atrophy is within normal limits for age. Patchy to confluent T2 hyperintensities in the cerebral white matter and pons are nonspecific but compatible with moderately advanced chronic small vessel ischemic disease, mildly progressed from the 2011 MRI most notably in the pons. T2 hyperintensities throughout the basal ganglia bilaterally are unchanged from the prior MRI and appear to predominantly reflect dilated perivascular spaces. There is also mild-to-moderate chronic T2 heterogeneity in the thalami. A left parafalcine arachnoid cyst in the posterior frontal region is unchanged. Vascular: Major intracranial vascular flow voids are preserved. Skull and upper cervical spine: Unremarkable bone marrow signal. Sinuses/Orbits: Bilateral cataract extraction. Scattered small mucous retention cysts in the paranasal sinuses. Clear mastoid air cells. Other: None. IMPRESSION: 1. No acute intracranial abnormality. 2. Moderately advanced chronic small vessel ischemic disease.  Electronically Signed   By: Logan Bores M.D.   On: 02/22/2018 19:39    Micro Results   No results found for this or any previous visit (from the past 240 hour(s)).     Today   Subjective    [facility] today states that she has no deficits and is good to go back home.  She has a follow-up appointment with the neurosurgeon on 1/13 and a follow-up appointment with her primary care provider on 1/22.   Objective   Blood pressure (!) 146/54, pulse 94, temperature 98.3 F (36.8 C), temperature source Oral, resp. rate 17, height 5' (1.524 m), weight 81.6 kg, SpO2 93 %.   Intake/Output Summary (Last 24 hours) at 02/23/2018 0749 Last data filed at 02/23/2018 0400 Gross per 24 hour  Intake 457.65 ml  Output -  Net 457.65 ml    Exam  Constitutional: NAD, calm, comfortable Eyes: PERRL, lids and conjunctivae normal ENMT:  Mucous membranes are moist. Posterior pharynx clear of any exudate or lesions.Normal dentition.  Neck: normal, supple, no masses, no thyromegaly Respiratory: clear to auscultation bilaterally, no wheezing, no crackles. Normal respiratory effort. No accessory muscle use.  Cardiovascular: Regular rate and rhythm, no murmurs / rubs / gallops.  2+ pitting edema in the right lower extremity and no lower extremity edema on the left. 2+ pedal pulses. No carotid bruits.  Abdomen: no tenderness, no masses palpated. No hepatosplenomegaly. Bowel sounds positive.  Musculoskeletal: no clubbing / cyanosis. No joint deformity upper and lower extremities. Good ROM, no contractures. Normal muscle tone.  Skin: no rashes, lesions, ulcers. No induration Neurologic: CN 2-12 grossly intact. Sensation intact, DTR normal. Strength 5/5 in all 4.  Psychiatric: Normal judgment and insight. Alert and oriented x 3. Normal mood.    Data Review   CBC w Diff:  Lab Results  Component Value Date   WBC 8.0 02/22/2018   HGB 12.1 02/22/2018   HGB 12.8 05/06/2016   HCT 37.6 02/22/2018   HCT  39.2 05/06/2016   PLT 198 02/22/2018   PLT 225 05/06/2016   LYMPHOPCT 30.9 11/20/2017   LYMPHOPCT 25.2 05/06/2016   MONOPCT 13.1 (H) 11/20/2017   MONOPCT 10.9 05/06/2016   EOSPCT 2.4 11/20/2017   EOSPCT 3.0 05/06/2016   BASOPCT 0.7 11/20/2017   BASOPCT 0.2 05/06/2016    CMP:  Lab Results  Component Value Date   NA 131 (L) 02/22/2018   NA 141 05/06/2016   K 3.3 (L) 02/22/2018   K 3.5 05/06/2016   CL 94 (L) 02/22/2018   CL 101 06/20/2012   CO2 26 02/22/2018   CO2 31 (H) 05/06/2016   BUN 20 02/22/2018   BUN 17.8 05/06/2016   CREATININE 0.95 02/22/2018   CREATININE 0.7 05/06/2016   PROT 6.2 (L) 02/22/2018   PROT 6.8 05/06/2016   ALBUMIN 3.4 (L) 02/22/2018   ALBUMIN 3.5 05/06/2016   BILITOT 0.8 02/22/2018   BILITOT 0.65 05/06/2016   ALKPHOS 55 02/22/2018   ALKPHOS 79 05/06/2016   AST 21 02/22/2018   AST 22 05/06/2016   ALT 18 02/22/2018   ALT 22 05/06/2016  .   Total Time in preparing paper work, data evaluation and todays exam - 35 minutes  Norval Morton M.D on 02/23/2018 at 7:49 AM  [facility]   Office  [phone]

## 2018-02-23 NOTE — Progress Notes (Signed)
2D Echocardiogram has been performed.  Gina [name] 02/23/2018, 10:01 AM

## 2018-02-23 NOTE — Progress Notes (Addendum)
STROKE TEAM PROGRESS NOTE   INTERVAL HISTORY PT was at bedside. No family present.  Patient awake, alert in NAD. Discussed POC with Dr. Leonie Man. Plan for routine EEG today, and ECHO pending results may discharge home.   Vitals:   02/22/18 2230 02/23/18 0030 02/23/18 0230 02/23/18 0630  BP: (!) 119/48 (!) 150/50 (!) 145/58 (!) 146/54  Pulse: 93 92 98 94  Resp: 19 20 17 17   Temp: 97.7 F (36.5 C) 97.7 F (36.5 C) 98.3 F (36.8 C)   TempSrc: Oral Oral Oral   SpO2: 95% 98% 93% 93%  Weight:      Height:        CBC:  Recent Labs  Lab 02/22/18 1516 02/23/18 0658  WBC 8.0 6.9  HGB 12.1 11.9*  HCT 37.6 35.0*  MCV 100.3* 100.3*  PLT 198 329    Basic Metabolic Panel:  Recent Labs  Lab 02/22/18 1516  NA 131*  K 3.3*  CL 94*  CO2 26  GLUCOSE 143*  BUN 20  CREATININE 0.95  CALCIUM 9.5   Lipid Panel:     Component Value Date/Time   CHOL 157 11/20/2017 0841   TRIG 113.0 11/20/2017 0841   TRIG 192 (H) 01/12/2006 0745   HDL 77.30 11/20/2017 0841   CHOLHDL 2 11/20/2017 0841   VLDL 22.6 11/20/2017 0841   LDLCALC 57 11/20/2017 0841   HgbA1c:  Lab Results  Component Value Date   HGBA1C 5.9 (H) 02/23/2018   Urine Drug Screen: No results found for: LABOPIA, COCAINSCRNUR, LABBENZ, AMPHETMU, THCU, LABBARB  Alcohol Level No results found for: ETH  IMAGING Ct Angio Head W Or Wo Contrast  Result Date: 02/22/2018 CLINICAL DATA:  TIA. Transient agraphia, confusion, and right arm numbness today. EXAM: CT ANGIOGRAPHY HEAD AND NECK TECHNIQUE: Multidetector CT imaging of the head and neck was performed using the standard protocol during bolus administration of intravenous contrast. Multiplanar CT image reconstructions and MIPs were obtained to evaluate the vascular anatomy. Carotid stenosis measurements (when applicable) are obtained utilizing NASCET criteria, using the distal internal carotid diameter as the denominator. CONTRAST:  14mL ISOVUE-370 IOPAMIDOL (ISOVUE-370) INJECTION 76%  COMPARISON:  Head CT 11/12/2009 and MRI/MRA 11/13/2009 FINDINGS: CT HEAD FINDINGS Brain: There is no evidence of acute infarct, intracranial hemorrhage, mass, midline shift, or extra-axial fluid collection. Mild cerebral atrophy is within normal limits for age. Patchy to confluent cerebral white matter hypodensities are similar to the prior CT and nonspecific but compatible with moderately advanced chronic small vessel ischemic disease. Heterogeneous hypoattenuation in the basal ganglia bilaterally is also similar to the prior CT with numerous dilated perivascular spaces seen on MRI. A left parafalcine arachnoid cyst is incidentally noted in the posterior frontal region, unchanged and of no current clinical significance. Vascular: Calcified atherosclerosis at the skull base. Skull: No fracture or focal osseous lesion. Sinuses: Mid left ethmoid air cell osteoma. Posterior left ethmoid air cell and left maxillary sinus mucous retention cysts. Clear mastoid air cells. Orbits: Bilateral cataract extraction. Review of the MIP images confirms the above findings CTA NECK FINDINGS Aortic arch: Standard 3 vessel aortic arch with extensive calcified plaque. Mild proximal left subclavian artery stenosis due to calcified plaque. Right carotid system: Patent with moderate calcified plaque about the carotid bifurcation. No evidence of significant stenosis or dissection Left carotid system: Patent with mild calcified plaque about the carotid bifurcation. No evidence of significant stenosis or dissection. Vertebral arteries: Patent and codominant without evidence of significant stenosis or dissection. Skeleton: Severe disc  and moderate to severe facet degeneration at multiple levels in the cervical spine. Other neck: Thyroid asymmetry with the right lobe being larger than left and without a discrete lesion identified. Upper chest: Clear lung apices. Review of the MIP images confirms the above findings CTA HEAD FINDINGS Anterior  circulation: The internal carotid arteries are patent from skull base to carotid termini with mild-to-moderate calcified plaque bilaterally not resulting in significant stenosis. ACAs and MCAs are patent with moderate branch vessel irregularity but no evidence of proximal branch occlusion. There are mild-to-moderate distal left A1 and proximal left A2 stenoses. No aneurysm is identified. Posterior circulation: The intracranial vertebral arteries are widely patent to the basilar. Patent bilateral PICA, right AICA, and bilateral SCA origins are identified. The basilar artery is widely patent. Posterior communicating arteries are diminutive or absent. PCAs are patent with branch vessel irregularity but no significant proximal stenosis. No aneurysm is identified. Venous sinuses: Patent. Anatomic variants: None. Delayed phase: No abnormal enhancement. Review of the MIP images confirms the above findings IMPRESSION: 1. No evidence of acute intracranial abnormality. 2. Moderately advanced chronic small vessel ischemic disease. 3. No large vessel occlusion. 4. Right greater than left cervical carotid artery atherosclerosis without stenosis. 5. Intracranial atherosclerosis resulting in mild-to-moderate distal left A1 and proximal left A2 stenoses. Electronically Signed   By: Logan Bores M.D.   On: 02/22/2018 18:53   Ct Angio Neck W Or Wo Contrast  Result Date: 02/22/2018 CLINICAL DATA:  TIA. Transient agraphia, confusion, and right arm numbness today. EXAM: CT ANGIOGRAPHY HEAD AND NECK TECHNIQUE: Multidetector CT imaging of the head and neck was performed using the standard protocol during bolus administration of intravenous contrast. Multiplanar CT image reconstructions and MIPs were obtained to evaluate the vascular anatomy. Carotid stenosis measurements (when applicable) are obtained utilizing NASCET criteria, using the distal internal carotid diameter as the denominator. CONTRAST:  30mL ISOVUE-370 IOPAMIDOL  (ISOVUE-370) INJECTION 76% COMPARISON:  Head CT 11/12/2009 and MRI/MRA 11/13/2009 FINDINGS: CT HEAD FINDINGS Brain: There is no evidence of acute infarct, intracranial hemorrhage, mass, midline shift, or extra-axial fluid collection. Mild cerebral atrophy is within normal limits for age. Patchy to confluent cerebral white matter hypodensities are similar to the prior CT and nonspecific but compatible with moderately advanced chronic small vessel ischemic disease. Heterogeneous hypoattenuation in the basal ganglia bilaterally is also similar to the prior CT with numerous dilated perivascular spaces seen on MRI. A left parafalcine arachnoid cyst is incidentally noted in the posterior frontal region, unchanged and of no current clinical significance. Vascular: Calcified atherosclerosis at the skull base. Skull: No fracture or focal osseous lesion. Sinuses: Mid left ethmoid air cell osteoma. Posterior left ethmoid air cell and left maxillary sinus mucous retention cysts. Clear mastoid air cells. Orbits: Bilateral cataract extraction. Review of the MIP images confirms the above findings CTA NECK FINDINGS Aortic arch: Standard 3 vessel aortic arch with extensive calcified plaque. Mild proximal left subclavian artery stenosis due to calcified plaque. Right carotid system: Patent with moderate calcified plaque about the carotid bifurcation. No evidence of significant stenosis or dissection Left carotid system: Patent with mild calcified plaque about the carotid bifurcation. No evidence of significant stenosis or dissection. Vertebral arteries: Patent and codominant without evidence of significant stenosis or dissection. Skeleton: Severe disc and moderate to severe facet degeneration at multiple levels in the cervical spine. Other neck: Thyroid asymmetry with the right lobe being larger than left and without a discrete lesion identified. Upper chest: Clear lung apices. Review of  the MIP images confirms the above findings CTA  HEAD FINDINGS Anterior circulation: The internal carotid arteries are patent from skull base to carotid termini with mild-to-moderate calcified plaque bilaterally not resulting in significant stenosis. ACAs and MCAs are patent with moderate branch vessel irregularity but no evidence of proximal branch occlusion. There are mild-to-moderate distal left A1 and proximal left A2 stenoses. No aneurysm is identified. Posterior circulation: The intracranial vertebral arteries are widely patent to the basilar. Patent bilateral PICA, right AICA, and bilateral SCA origins are identified. The basilar artery is widely patent. Posterior communicating arteries are diminutive or absent. PCAs are patent with branch vessel irregularity but no significant proximal stenosis. No aneurysm is identified. Venous sinuses: Patent. Anatomic variants: None. Delayed phase: No abnormal enhancement. Review of the MIP images confirms the above findings IMPRESSION: 1. No evidence of acute intracranial abnormality. 2. Moderately advanced chronic small vessel ischemic disease. 3. No large vessel occlusion. 4. Right greater than left cervical carotid artery atherosclerosis without stenosis. 5. Intracranial atherosclerosis resulting in mild-to-moderate distal left A1 and proximal left A2 stenoses. Electronically Signed   By: Logan Bores M.D.   On: 02/22/2018 18:53   Mr Brain Wo Contrast  Result Date: 02/22/2018 CLINICAL DATA:  TIA. Transient agraphia, confusion, and right arm numbness today. EXAM: MRI HEAD WITHOUT CONTRAST TECHNIQUE: Multiplanar, multiecho pulse sequences of the brain and surrounding structures were obtained without intravenous contrast. COMPARISON:  Head CT 02/22/2018 and MRI 11/13/2009 FINDINGS: Brain: There is no evidence of acute infarct, intracranial hemorrhage, mass, midline shift, or extra-axial fluid collection. Mild cerebral atrophy is within normal limits for age. Patchy to confluent T2 hyperintensities in the cerebral  white matter and pons are nonspecific but compatible with moderately advanced chronic small vessel ischemic disease, mildly progressed from the 2011 MRI most notably in the pons. T2 hyperintensities throughout the basal ganglia bilaterally are unchanged from the prior MRI and appear to predominantly reflect dilated perivascular spaces. There is also mild-to-moderate chronic T2 heterogeneity in the thalami. A left parafalcine arachnoid cyst in the posterior frontal region is unchanged. Vascular: Major intracranial vascular flow voids are preserved. Skull and upper cervical spine: Unremarkable bone marrow signal. Sinuses/Orbits: Bilateral cataract extraction. Scattered small mucous retention cysts in the paranasal sinuses. Clear mastoid air cells. Other: None. IMPRESSION: 1. No acute intracranial abnormality. 2. Moderately advanced chronic small vessel ischemic disease. Electronically Signed   By: Logan Bores M.D.   On: 02/22/2018 19:39    PHYSICAL EXAM per Dr. Leonie Man  ASSESSMENT/PLAN Gina [name] is a 85 y.o. female with history of  TIA, PAD, HTN, HLD, GIB, macular degeneration presenting with  Confusion, right hand numbness that lasted about 30 minutes.    Currently she is back to baseline, but states this was similer to previous TIA in 2011, but at that time she states she had a clear aphasia. ABCD2= 4 (scoring agraphia as part of a speech deficit). She will need stroke work up.    TIA: given transient nature of symptoms.  Seizure less likely  CTA head & neck . No evidence of acute intracranial abnormality. 2. Moderately advanced chronic small vessel ischemic disease. 3. No large vessel occlusion. 4. Right greater than left cervical carotid artery atherosclerosis without stenosis. 5. Intracranial atherosclerosis resulting in mild-to-moderate distal left A1 and proximal left A2 stenoses.  MRI   No acute intracranial abnormality.Moderately advanced chronic small vessel ischemic  disease.  2D Echo   Left ventricle: The cavity size was normal. Wall thickness  wasnormal. Systolic function was normal. The estimated ejection fraction was in the range of 55% to 60%. Wall motion was normal;there were no regional wall motion abnormalities. Doppler parameters are consistent with abnormal left ventricular relaxation (grade 1 diastolic dysfunction). - Aortic valve: Noncoronary cusp mobility was moderately   restricted. Sclerosis without stenosis. - Mitral valve: Calcified annulus. - Left atrium: The atrium was mildly dilated. - Pericardium, extracardiac: A trivial pericardial effusion was   identified.  LDL 42  HgbA1c 5.9  EEG: pending  SCD's for VTE prophylaxis Diet Order            Diet Heart Room service appropriate? Yes; Fluid consistency: Thin  Diet effective now               clopidogrel 75 mg daily prior to admission, now on clopidogrel 75 mg daily. No ASA d/t patient allergy to ASA- HIVES  Therapy recommendations:  No PT follow up  Disposition:  home  Hypertension  Stable . Permissive hypertension (OK if < 220/120) but gradually normalize in 5-7 days . Long-term BP goal normotensive  Hyperlipidemia  Home meds:  Atorvastatin 40 mg , resumed in hospital  LDL 42, goal < 70  Continue statin at discharge  Diabetes type II  HgbA1c 5.9, goal < 7.0  Controlled  Other Stroke Risk Factors  Advanced age  Obesity, Body mass index is 35.15 kg/m., recommend weight loss, diet and exercise as appropriate   Hx stroke/TIA  Other Active Problems  Laurey Morale, MSN, NP-C [facility] day # 0 I have personally obtained history,examined this patient, reviewed notes, independently viewed imaging studies, participated in medical decision making and plan of care.ROS completed by me personally and pertinent positives fully documented  I have made any additions or clarifications directly to the above note. Agree  with note above. She presented  with transient confusion and speech difficulties likely a left hemispheric TIA. Seizures less likely. Continue ongoing stroke workup. Continue Plavix as patient is allergic to aspirin. On discussion with patient and answered questions. Greater than 50% time during this 35 minute visit was spent  on counseling and coordination of care about TIA versus seizure and answering questions.  Antony Contras, MD Medical Director [facility] Pager: [phone] 02/23/2018 3:32 PM    To contact Stroke Continuity provider, please refer to [email or web address] After hours, contact General Neurology

## 2018-02-24 LAB — URINE CULTURE: Culture: <10000 — AB

## 2018-02-25 DIAGNOSIS — D539 Nutritional anemia, unspecified: Secondary | ICD-10-CM | POA: Diagnosis present

## 2018-02-26 ENCOUNTER — Telehealth: Payer: Self-pay | Admitting: *Deleted

## 2018-02-26 ENCOUNTER — Other Ambulatory Visit: Payer: Self-pay | Admitting: Cardiology

## 2018-02-26 DIAGNOSIS — M47816 Spondylosis without myelopathy or radiculopathy, lumbar region: Secondary | ICD-10-CM | POA: Diagnosis not present

## 2018-02-26 DIAGNOSIS — Z6831 Body mass index (BMI) 31.0-31.9, adult: Secondary | ICD-10-CM | POA: Diagnosis not present

## 2018-02-26 DIAGNOSIS — M48062 Spinal stenosis, lumbar region with neurogenic claudication: Secondary | ICD-10-CM | POA: Diagnosis not present

## 2018-02-26 DIAGNOSIS — M5136 Other intervertebral disc degeneration, lumbar region: Secondary | ICD-10-CM | POA: Diagnosis not present

## 2018-02-26 DIAGNOSIS — M4316 Spondylolisthesis, lumbar region: Secondary | ICD-10-CM | POA: Diagnosis not present

## 2018-02-26 DIAGNOSIS — I639 Cerebral infarction, unspecified: Secondary | ICD-10-CM

## 2018-02-26 DIAGNOSIS — I10 Essential (primary) hypertension: Secondary | ICD-10-CM | POA: Diagnosis not present

## 2018-02-26 DIAGNOSIS — M5416 Radiculopathy, lumbar region: Secondary | ICD-10-CM | POA: Diagnosis not present

## 2018-02-26 NOTE — Telephone Encounter (Signed)
Transition Care Management Follow-up Telephone Call   Date discharged? 02/23/18   How have you been since you were released from the hospital? Pt states she's doing fairly well   Do you understand why you were in the hospital? YES   Do you understand the discharge instructions? YES   Where were you discharged to? Home   Items Reviewed:  Medications reviewed: YES  Allergies reviewed: YES  Dietary changes reviewed: YES, Heart healthy  Referrals reviewed: No referral needed   Functional Questionnaire:   Activities of Daily Living (ADLs):   She states she are independent in the following: bathing and hygiene, feeding, continence, grooming, toileting and dressing States they require assistance with the following: ambulation   Any transportation issues/concerns?: NO   Any patient concerns? NO   Confirmed importance and date/time of follow-up visits scheduled YES, appt 02/28/18  Provider Appointment booked with Dr. Alain Marion  Confirmed with patient if condition begins to worsen call PCP or go to the ER.  Patient was given the office number and encouraged to call back with question or concerns.  : YES

## 2018-02-27 DIAGNOSIS — L820 Inflamed seborrheic keratosis: Secondary | ICD-10-CM | POA: Diagnosis not present

## 2018-02-28 ENCOUNTER — Ambulatory Visit (INDEPENDENT_AMBULATORY_CARE_PROVIDER_SITE_OTHER): Payer: Medicare Other

## 2018-02-28 ENCOUNTER — Encounter: Payer: Self-pay | Admitting: Internal Medicine

## 2018-02-28 ENCOUNTER — Ambulatory Visit (INDEPENDENT_AMBULATORY_CARE_PROVIDER_SITE_OTHER): Payer: Medicare Other | Admitting: Internal Medicine

## 2018-02-28 ENCOUNTER — Other Ambulatory Visit: Payer: Self-pay | Admitting: Cardiology

## 2018-02-28 VITALS — BP 114/48 | HR 85 | Temp 98.2°F | Ht 60.0 in | Wt 175.0 lb

## 2018-02-28 DIAGNOSIS — G459 Transient cerebral ischemic attack, unspecified: Secondary | ICD-10-CM | POA: Diagnosis not present

## 2018-02-28 DIAGNOSIS — I639 Cerebral infarction, unspecified: Secondary | ICD-10-CM

## 2018-02-28 DIAGNOSIS — R197 Diarrhea, unspecified: Secondary | ICD-10-CM

## 2018-02-28 DIAGNOSIS — I4891 Unspecified atrial fibrillation: Secondary | ICD-10-CM

## 2018-02-28 DIAGNOSIS — I10 Essential (primary) hypertension: Secondary | ICD-10-CM | POA: Diagnosis not present

## 2018-02-28 DIAGNOSIS — Z87898 Personal history of other specified conditions: Secondary | ICD-10-CM

## 2018-02-28 DIAGNOSIS — R270 Ataxia, unspecified: Secondary | ICD-10-CM | POA: Diagnosis not present

## 2018-02-28 NOTE — Assessment & Plan Note (Signed)
Walker

## 2018-02-28 NOTE — Assessment & Plan Note (Signed)
Resolved

## 2018-02-28 NOTE — Assessment & Plan Note (Signed)
BP Readings from Last 3 Encounters:  02/28/18 (!) 114/48  02/23/18 (!) 153/55  11/20/17 136/78

## 2018-02-28 NOTE — Assessment & Plan Note (Signed)
x30 min - resolved Heart monitor Plavix Cardiology referral

## 2018-02-28 NOTE — Progress Notes (Signed)
Subjective:  Patient ID: Gina [name], female    DOB: Oct 08, 1931  Age: 85 y.o. MRN: [id number]  CC: No chief complaint on file.   HPI Gina [name] presents for TIA x 30 min on 1/920. F/u HTN, CAD  Per hx: "1. Transient ischemic attack: Patient initially presented with right hand hand numbness and confusion concerning for TIA.  Deficits resolved prior to arrival to the hospital.  CT angiogram of the head and neck showed no acute intracranial abnormality with moderately advanced small vessel disease.  Neurology recommended admission for further stroke work-up.  Labs revealed hemoglobin A1c 5.9, total cholesterol 128, HDL 79, and LDL 42.  Echo noted EF 55 to 60% with grade 1 diastolic dysfunction.  MRI did not show any acute abnormality.  There was also concern for the possibility of a seizure and patient underwent EEG that noted to be normal.  Physical therapy evaluated patient and cleared her for safety.  At discharge patient was recommended to have continue Plavix as she reported allergy to aspirin along with statin.  Furthermore, she was recommended to be set up with a Holter monitor.  Urinary tract infection: Acute.  Patient was initially given 1 g of Rocephin IV.  Then prescribed ciprofloxacin at discharge to complete course.  Hypokalemia: Patient potassium was 3.3 on day of admission. She was not noted to have received replacement.  No repeat lab work was obtained the following day.  Essential hypertension: Patient blood pressures remained normotensive during hospital stay.  She was continued on home medications of enalapril, amlodipine and Maxide.  Hyperlipidemia: She was continued on her home statin.  Macrocytic anemia: Patient's initial hemoglobin 11.9 with elevated MCV.  Recommended vitamin B12 and folate levels to be checked in outpatient setting.  History of colon cancer  Follow UP  Consults obtained: Neurology Samara Snide, MD  Discharge Condition:  Good  Diet and Activity recommendation: See Discharge Instructions below  Discharge Instructions   Patient was advised that she keep her follow-up appointment with her primary care provider on 03/07/2018 as already scheduled"  C/o diarrhea starting Sun. Better now on a probiotic, imodium.  Outpatient Medications Prior to Visit  Medication Sig Dispense Refill  . acetaminophen (TYLENOL) 500 MG tablet Take 1,000 mg by mouth 3 (three) times daily as needed (for pain.).     [name] allopurinol (ZYLOPRIM) 100 MG tablet Take 1 tablet (100 mg total) by mouth daily. 90 tablet 3  . amLODipine (NORVASC) 5 MG tablet Take 1 tablet (5 mg total) by mouth daily. 90 tablet 3  . atorvastatin (LIPITOR) 40 MG tablet Take 1 tablet (40 mg total) by mouth every evening. 90 tablet 3  . Calcium Carbonate (CALCIUM 600 PO) Take 600 mg by mouth 2 (two) times daily.     . cetirizine (ZYRTEC) 10 MG tablet Take 10 mg by mouth 2 (two) times daily.     . ciprofloxacin (CIPRO) 250 MG tablet Take 1 tablet (250 mg total) by mouth 2 (two) times daily. 8 tablet 0  . clopidogrel (PLAVIX) 75 MG tablet Take 1 tablet (75 mg total) by mouth daily. 90 tablet 3  . enalapril (VASOTEC) 10 MG tablet Take 1 tablet (10 mg total) by mouth daily. 90 tablet 3  . gabapentin (NEURONTIN) 300 MG capsule Take 300 mg by mouth daily.    . Multiple Vitamin (MULTIVITAMIN WITH MINERALS) TABS tablet Take 1 tablet by mouth daily.    . Multiple Vitamins-Minerals (PRESERVISION AREDS 2 PO) Take 1 tablet  by mouth daily.    [name] saccharomyces boulardii (FLORASTOR) 250 MG capsule Take 250 mg by mouth daily.    [name] triamterene-hydrochlorothiazide (MAXZIDE-25) 37.5-25 MG tablet Take 1 tablet by mouth daily. 90 tablet 3  . vitamin C (ASCORBIC ACID) 500 MG tablet Take 500 mg by mouth every evening.     No facility-administered medications prior to visit.     ROS: Review of Systems  Constitutional: Negative for activity change, appetite change, chills, fatigue and  unexpected weight change.  HENT: Negative for congestion, mouth sores and sinus pressure.   Eyes: Positive for visual disturbance.  Respiratory: Negative for cough and chest tightness.   Gastrointestinal: Negative for abdominal pain and nausea.  Genitourinary: Negative for difficulty urinating, frequency and vaginal pain.  Musculoskeletal: Positive for arthralgias, back pain and gait problem.  Skin: Negative for pallor and rash.  Neurological: Negative for dizziness, tremors, weakness, numbness and headaches.  Psychiatric/Behavioral: Negative for confusion and sleep disturbance. The patient is not nervous/anxious.     Objective:  BP (!) 114/48 (BP Location: Right Arm, Patient Position: Sitting, Cuff Size: Normal)   Pulse 85   Temp 98.2 F (36.8 C) (Oral)   Ht 5' (1.524 m)   Wt 175 lb (79.4 kg)   SpO2 95%   BMI 34.18 kg/m   BP Readings from Last 3 Encounters:  02/28/18 (!) 114/48  02/23/18 (!) 153/55  11/20/17 136/78    Wt Readings from Last 3 Encounters:  02/28/18 175 lb (79.4 kg)  02/22/18 180 lb (81.6 kg)  11/20/17 184 lb (83.5 kg)    Physical Exam Constitutional:      General: She is not in acute distress.    Appearance: She is well-developed.  HENT:     Head: Normocephalic.     Right Ear: External ear normal.     Left Ear: External ear normal.     Nose: Nose normal.  Eyes:     General:        Right eye: No discharge.        Left eye: No discharge.     Conjunctiva/sclera: Conjunctivae normal.     Pupils: Pupils are equal, round, and reactive to light.  Neck:     Musculoskeletal: Normal range of motion and neck supple.     Thyroid: No thyromegaly.     Vascular: No JVD.     Trachea: No tracheal deviation.  Cardiovascular:     Rate and Rhythm: Normal rate and regular rhythm.     Heart sounds: Normal heart sounds.  Pulmonary:     Effort: No respiratory distress.     Breath sounds: No stridor. No wheezing.  Abdominal:     General: Bowel sounds are normal.  There is no distension.     Palpations: Abdomen is soft. There is no mass.     Tenderness: There is no abdominal tenderness. There is no guarding or rebound.  Musculoskeletal:        General: No tenderness.  Lymphadenopathy:     Cervical: No cervical adenopathy.  Skin:    Findings: No erythema or rash.  Neurological:     Cranial Nerves: No cranial nerve deficit.     Motor: No abnormal muscle tone.     Coordination: Coordination abnormal.     Deep Tendon Reflexes: Reflexes normal.  Psychiatric:        Behavior: Behavior normal.        Thought Content: Thought content normal.        Judgment:  Judgment normal.    A/o/c   Lab Results  Component Value Date   WBC 6.9 02/23/2018   HGB 11.9 (L) 02/23/2018   HCT 35.0 (L) 02/23/2018   PLT 167 02/23/2018   GLUCOSE 109 (H) 02/23/2018   CHOL 128 02/23/2018   TRIG 37 02/23/2018   HDL 79 02/23/2018   LDLDIRECT 69.0 09/09/2014   LDLCALC 42 02/23/2018   ALT 18 02/22/2018   AST 21 02/22/2018   NA 134 (L) 02/23/2018   K 3.3 (L) 02/23/2018   CL 98 02/23/2018   CREATININE 0.72 02/23/2018   BUN 10 02/23/2018   CO2 25 02/23/2018   TSH 1.235 02/22/2018   INR 0.96 07/24/2017   HGBA1C 5.9 (H) 02/23/2018    Ct Angio Head W Or Wo Contrast  Result Date: 02/22/2018 CLINICAL DATA:  TIA. Transient agraphia, confusion, and right arm numbness today. EXAM: CT ANGIOGRAPHY HEAD AND NECK TECHNIQUE: Multidetector CT imaging of the head and neck was performed using the standard protocol during bolus administration of intravenous contrast. Multiplanar CT image reconstructions and MIPs were obtained to evaluate the vascular anatomy. Carotid stenosis measurements (when applicable) are obtained utilizing NASCET criteria, using the distal internal carotid diameter as the denominator. CONTRAST:  80mL ISOVUE-370 IOPAMIDOL (ISOVUE-370) INJECTION 76% COMPARISON:  Head CT 11/12/2009 and MRI/MRA 11/13/2009 FINDINGS: CT HEAD FINDINGS Brain: There is no evidence of  acute infarct, intracranial hemorrhage, mass, midline shift, or extra-axial fluid collection. Mild cerebral atrophy is within normal limits for age. Patchy to confluent cerebral white matter hypodensities are similar to the prior CT and nonspecific but compatible with moderately advanced chronic small vessel ischemic disease. Heterogeneous hypoattenuation in the basal ganglia bilaterally is also similar to the prior CT with numerous dilated perivascular spaces seen on MRI. A left parafalcine arachnoid cyst is incidentally noted in the posterior frontal region, unchanged and of no current clinical significance. Vascular: Calcified atherosclerosis at the skull base. Skull: No fracture or focal osseous lesion. Sinuses: Mid left ethmoid air cell osteoma. Posterior left ethmoid air cell and left maxillary sinus mucous retention cysts. Clear mastoid air cells. Orbits: Bilateral cataract extraction. Review of the MIP images confirms the above findings CTA NECK FINDINGS Aortic arch: Standard 3 vessel aortic arch with extensive calcified plaque. Mild proximal left subclavian artery stenosis due to calcified plaque. Right carotid system: Patent with moderate calcified plaque about the carotid bifurcation. No evidence of significant stenosis or dissection Left carotid system: Patent with mild calcified plaque about the carotid bifurcation. No evidence of significant stenosis or dissection. Vertebral arteries: Patent and codominant without evidence of significant stenosis or dissection. Skeleton: Severe disc and moderate to severe facet degeneration at multiple levels in the cervical spine. Other neck: Thyroid asymmetry with the right lobe being larger than left and without a discrete lesion identified. Upper chest: Clear lung apices. Review of the MIP images confirms the above findings CTA HEAD FINDINGS Anterior circulation: The internal carotid arteries are patent from skull base to carotid termini with mild-to-moderate  calcified plaque bilaterally not resulting in significant stenosis. ACAs and MCAs are patent with moderate branch vessel irregularity but no evidence of proximal branch occlusion. There are mild-to-moderate distal left A1 and proximal left A2 stenoses. No aneurysm is identified. Posterior circulation: The intracranial vertebral arteries are widely patent to the basilar. Patent bilateral PICA, right AICA, and bilateral SCA origins are identified. The basilar artery is widely patent. Posterior communicating arteries are diminutive or absent. PCAs are patent with branch vessel irregularity but  no significant proximal stenosis. No aneurysm is identified. Venous sinuses: Patent. Anatomic variants: None. Delayed phase: No abnormal enhancement. Review of the MIP images confirms the above findings IMPRESSION: 1. No evidence of acute intracranial abnormality. 2. Moderately advanced chronic small vessel ischemic disease. 3. No large vessel occlusion. 4. Right greater than left cervical carotid artery atherosclerosis without stenosis. 5. Intracranial atherosclerosis resulting in mild-to-moderate distal left A1 and proximal left A2 stenoses. Electronically Signed   By: Logan Bores M.D.   On: 02/22/2018 18:53   Ct Angio Neck W Or Wo Contrast  Result Date: 02/22/2018 CLINICAL DATA:  TIA. Transient agraphia, confusion, and right arm numbness today. EXAM: CT ANGIOGRAPHY HEAD AND NECK TECHNIQUE: Multidetector CT imaging of the head and neck was performed using the standard protocol during bolus administration of intravenous contrast. Multiplanar CT image reconstructions and MIPs were obtained to evaluate the vascular anatomy. Carotid stenosis measurements (when applicable) are obtained utilizing NASCET criteria, using the distal internal carotid diameter as the denominator. CONTRAST:  45mL ISOVUE-370 IOPAMIDOL (ISOVUE-370) INJECTION 76% COMPARISON:  Head CT 11/12/2009 and MRI/MRA 11/13/2009 FINDINGS: CT HEAD FINDINGS Brain:  There is no evidence of acute infarct, intracranial hemorrhage, mass, midline shift, or extra-axial fluid collection. Mild cerebral atrophy is within normal limits for age. Patchy to confluent cerebral white matter hypodensities are similar to the prior CT and nonspecific but compatible with moderately advanced chronic small vessel ischemic disease. Heterogeneous hypoattenuation in the basal ganglia bilaterally is also similar to the prior CT with numerous dilated perivascular spaces seen on MRI. A left parafalcine arachnoid cyst is incidentally noted in the posterior frontal region, unchanged and of no current clinical significance. Vascular: Calcified atherosclerosis at the skull base. Skull: No fracture or focal osseous lesion. Sinuses: Mid left ethmoid air cell osteoma. Posterior left ethmoid air cell and left maxillary sinus mucous retention cysts. Clear mastoid air cells. Orbits: Bilateral cataract extraction. Review of the MIP images confirms the above findings CTA NECK FINDINGS Aortic arch: Standard 3 vessel aortic arch with extensive calcified plaque. Mild proximal left subclavian artery stenosis due to calcified plaque. Right carotid system: Patent with moderate calcified plaque about the carotid bifurcation. No evidence of significant stenosis or dissection Left carotid system: Patent with mild calcified plaque about the carotid bifurcation. No evidence of significant stenosis or dissection. Vertebral arteries: Patent and codominant without evidence of significant stenosis or dissection. Skeleton: Severe disc and moderate to severe facet degeneration at multiple levels in the cervical spine. Other neck: Thyroid asymmetry with the right lobe being larger than left and without a discrete lesion identified. Upper chest: Clear lung apices. Review of the MIP images confirms the above findings CTA HEAD FINDINGS Anterior circulation: The internal carotid arteries are patent from skull base to carotid termini  with mild-to-moderate calcified plaque bilaterally not resulting in significant stenosis. ACAs and MCAs are patent with moderate branch vessel irregularity but no evidence of proximal branch occlusion. There are mild-to-moderate distal left A1 and proximal left A2 stenoses. No aneurysm is identified. Posterior circulation: The intracranial vertebral arteries are widely patent to the basilar. Patent bilateral PICA, right AICA, and bilateral SCA origins are identified. The basilar artery is widely patent. Posterior communicating arteries are diminutive or absent. PCAs are patent with branch vessel irregularity but no significant proximal stenosis. No aneurysm is identified. Venous sinuses: Patent. Anatomic variants: None. Delayed phase: No abnormal enhancement. Review of the MIP images confirms the above findings IMPRESSION: 1. No evidence of acute intracranial abnormality. 2. Moderately  advanced chronic small vessel ischemic disease. 3. No large vessel occlusion. 4. Right greater than left cervical carotid artery atherosclerosis without stenosis. 5. Intracranial atherosclerosis resulting in mild-to-moderate distal left A1 and proximal left A2 stenoses. Electronically Signed   By: Logan Bores M.D.   On: 02/22/2018 18:53   Mr Brain Wo Contrast  Result Date: 02/22/2018 CLINICAL DATA:  TIA. Transient agraphia, confusion, and right arm numbness today. EXAM: MRI HEAD WITHOUT CONTRAST TECHNIQUE: Multiplanar, multiecho pulse sequences of the brain and surrounding structures were obtained without intravenous contrast. COMPARISON:  Head CT 02/22/2018 and MRI 11/13/2009 FINDINGS: Brain: There is no evidence of acute infarct, intracranial hemorrhage, mass, midline shift, or extra-axial fluid collection. Mild cerebral atrophy is within normal limits for age. Patchy to confluent T2 hyperintensities in the cerebral white matter and pons are nonspecific but compatible with moderately advanced chronic small vessel ischemic  disease, mildly progressed from the 2011 MRI most notably in the pons. T2 hyperintensities throughout the basal ganglia bilaterally are unchanged from the prior MRI and appear to predominantly reflect dilated perivascular spaces. There is also mild-to-moderate chronic T2 heterogeneity in the thalami. A left parafalcine arachnoid cyst in the posterior frontal region is unchanged. Vascular: Major intracranial vascular flow voids are preserved. Skull and upper cervical spine: Unremarkable bone marrow signal. Sinuses/Orbits: Bilateral cataract extraction. Scattered small mucous retention cysts in the paranasal sinuses. Clear mastoid air cells. Other: None. IMPRESSION: 1. No acute intracranial abnormality. 2. Moderately advanced chronic small vessel ischemic disease. Electronically Signed   By: Logan Bores M.D.   On: 02/22/2018 19:39    Assessment & Plan:   There are no diagnoses linked to this encounter.   No orders of the defined types were placed in this encounter.    Follow-up: No follow-ups on file.  Walker Kehr, MD

## 2018-03-07 ENCOUNTER — Ambulatory Visit: Payer: Medicare Other | Admitting: Internal Medicine

## 2018-03-08 ENCOUNTER — Telehealth: Payer: Self-pay | Admitting: *Deleted

## 2018-03-08 ENCOUNTER — Telehealth: Payer: Self-pay | Admitting: Internal Medicine

## 2018-03-08 NOTE — Telephone Encounter (Signed)
Will route to monitor techs, Markus Daft and Abram Sander to contact the pt in regards to sensitivity to stickers.

## 2018-03-08 NOTE — Telephone Encounter (Signed)
See shelly's telephone note

## 2018-03-08 NOTE — Telephone Encounter (Signed)
Returned patients call regarding acquiring sensitive skin electrodes.  Patient stated, she did not want to wear the cardiac event monitor any longer. She has had a reaction to the [employer] patch monitor and has open sores/ blisters.  She has had the monitor on for 8 days, plus the amount of time she was monitored in the hospital, and feels that is long enough.  Patient is aware we will not be able to catch any occurences of atrial fibrillation if she is not wearing the monitor.  Patient was informed to call her physician, should she require additional care for her skin reaction.

## 2018-03-08 NOTE — Telephone Encounter (Signed)
New message     1. Is this related to a heart monitor you are wearing?  (If the patient says no, please ask     if they are caling about ICD/pacemaker.) yes   2. What is your issue??  (If the patient is calling for results of the heart monitor this     message should be sent to nurse.) Pt stated that she seen Dr. Rayann Heman in hospital and was put on a 30 day monitor. She stated that she has an blister where monitor sits and if she still needs to wear it .   Please route to covering RN/CMA/RMA for results. Route to monitor technicians or your monitor tech representative for your site for any technical concerns

## 2018-03-10 DIAGNOSIS — I639 Cerebral infarction, unspecified: Secondary | ICD-10-CM | POA: Diagnosis not present

## 2018-03-10 DIAGNOSIS — I4891 Unspecified atrial fibrillation: Secondary | ICD-10-CM | POA: Diagnosis not present

## 2018-03-14 ENCOUNTER — Other Ambulatory Visit: Payer: Self-pay | Admitting: Cardiology

## 2018-03-14 DIAGNOSIS — I4891 Unspecified atrial fibrillation: Secondary | ICD-10-CM

## 2018-03-14 DIAGNOSIS — G459 Transient cerebral ischemic attack, unspecified: Secondary | ICD-10-CM

## 2018-03-14 DIAGNOSIS — I639 Cerebral infarction, unspecified: Secondary | ICD-10-CM

## 2018-03-19 ENCOUNTER — Telehealth: Payer: Self-pay | Admitting: *Deleted

## 2018-03-19 NOTE — Telephone Encounter (Signed)
Spoke with pt and discussed her 7 day event monitor results. Pt verbalized understanding. She said she has an appt scheduled with Dr. Christen Bame in the future and she just saw him She said she will keep that appt. She also wanted Dr. Leonie Man to know that she wore it for 9 days but took it off early because she formed a blood blister on her skin.

## 2018-03-19 NOTE — Telephone Encounter (Signed)
-----   Message from Garvin Fila, MD sent at 03/15/2018  7:12 PM EST ----- 7-day heart monitor result does not show atrial fibrillation and does show occasional ectopic beats and to discuss the significance of this with Dr. Alain Marion

## 2018-03-19 NOTE — Telephone Encounter (Signed)
That is all right the information we have is limited but does not show anything to worry about

## 2018-04-18 DIAGNOSIS — L308 Other specified dermatitis: Secondary | ICD-10-CM | POA: Diagnosis not present

## 2018-04-23 ENCOUNTER — Ambulatory Visit (INDEPENDENT_AMBULATORY_CARE_PROVIDER_SITE_OTHER): Payer: Medicare Other | Admitting: Internal Medicine

## 2018-04-23 ENCOUNTER — Encounter: Payer: Self-pay | Admitting: Internal Medicine

## 2018-04-23 VITALS — BP 156/60 | HR 92 | Temp 97.7°F | Ht 60.0 in | Wt 184.0 lb

## 2018-04-23 DIAGNOSIS — I10 Essential (primary) hypertension: Secondary | ICD-10-CM

## 2018-04-23 DIAGNOSIS — R42 Dizziness and giddiness: Secondary | ICD-10-CM

## 2018-04-23 DIAGNOSIS — G459 Transient cerebral ischemic attack, unspecified: Secondary | ICD-10-CM | POA: Diagnosis not present

## 2018-04-23 NOTE — Assessment & Plan Note (Signed)
Taking amlodipine and enalapril and hctz and BP close to goal. Recent K low but she declines recheck today. Do not think this should be causing current symptoms.

## 2018-04-23 NOTE — Patient Instructions (Signed)
We will have you monitor things at home and let us know if these feelings return.

## 2018-04-23 NOTE — Progress Notes (Signed)
   Subjective:   Patient ID: Gina [name], female    DOB: 06-17-1931, 85 y.o.   MRN: [id number]  HPI The patient is an 85 YO female coming in for dizziness (started at [facility] when going to bathroom, room spinning mildly, denies feeling lightheaded or feeling like she would pass out, went again to restroom middle of night and this was still present but gone in the morning, BP running higher than normal, denies recent illness or sickness, denies SOB or chest pains, denies headaches) and hypertension (BP elevated this morning after dizziness episode, denies headaches or chest pains, denies TIA symptoms, denies change in medications or diet), she does have many health concerns and was most concerned about recent TIA (in January and had some memory change acutely, this cleared within 30 minutes, taking plavix, MRI and workup without acute changes or findings, no known A fib).  Review of Systems  Constitutional: Negative.   HENT: Negative.   Eyes: Negative.   Respiratory: Negative for cough, chest tightness and shortness of breath.   Cardiovascular: Negative for chest pain, palpitations and leg swelling.  Gastrointestinal: Negative for abdominal distention, abdominal pain, constipation, diarrhea, nausea and vomiting.  Musculoskeletal: Negative.   Skin: Negative.   Neurological: Positive for dizziness. Negative for tremors, seizures, syncope, speech difficulty, weakness, light-headedness, numbness and headaches.  Psychiatric/Behavioral: Negative.     Objective:  Physical Exam Constitutional:      Appearance: She is well-developed.  HENT:     Head: Normocephalic and atraumatic.     Right Ear: Tympanic membrane and ear canal normal.     Left Ear: Tympanic membrane and ear canal normal.     Nose: Nose normal.     Mouth/Throat:     Mouth: Mucous membranes are moist.     Pharynx: Oropharynx is clear.  Neck:     Musculoskeletal: Normal range of motion.  Cardiovascular:     Rate and Rhythm:  Normal rate and regular rhythm.  Pulmonary:     Effort: Pulmonary effort is normal. No respiratory distress.     Breath sounds: Normal breath sounds. No wheezing or rales.  Abdominal:     General: Bowel sounds are normal. There is no distension.     Palpations: Abdomen is soft.     Tenderness: There is no abdominal tenderness. There is no rebound.  Skin:    General: Skin is warm and dry.  Neurological:     Mental Status: She is alert and oriented to person, place, and time.     Coordination: Coordination normal.     Vitals:   04/23/18 1054  BP: (!) 156/60  Pulse: 92  Temp: 97.7 F (36.5 C)  TempSrc: Oral  SpO2: 95%  Weight: 184 lb (83.5 kg)  Height: 5' (1.524 m)    Assessment & Plan:

## 2018-04-23 NOTE — Assessment & Plan Note (Signed)
Reassurance given that this does not sound like TIA episode and is not likely related in any way. I do not think this should alter her treatment.

## 2018-04-23 NOTE — Assessment & Plan Note (Signed)
Sounds like mild episode of vertigo versus just disorientation from awakening at night. This cleared on its own and would avoid meclizine as this can cause confusion and dizziness. Advised to hydrate well as this can help.

## 2018-04-25 DIAGNOSIS — M47816 Spondylosis without myelopathy or radiculopathy, lumbar region: Secondary | ICD-10-CM | POA: Diagnosis not present

## 2018-04-25 DIAGNOSIS — M5136 Other intervertebral disc degeneration, lumbar region: Secondary | ICD-10-CM | POA: Diagnosis not present

## 2018-04-25 DIAGNOSIS — M48062 Spinal stenosis, lumbar region with neurogenic claudication: Secondary | ICD-10-CM | POA: Diagnosis not present

## 2018-04-25 DIAGNOSIS — M5416 Radiculopathy, lumbar region: Secondary | ICD-10-CM | POA: Diagnosis not present

## 2018-04-25 DIAGNOSIS — M4316 Spondylolisthesis, lumbar region: Secondary | ICD-10-CM | POA: Diagnosis not present

## 2018-04-25 DIAGNOSIS — I10 Essential (primary) hypertension: Secondary | ICD-10-CM | POA: Diagnosis not present

## 2018-04-25 DIAGNOSIS — Z6833 Body mass index (BMI) 33.0-33.9, adult: Secondary | ICD-10-CM | POA: Diagnosis not present

## 2018-05-14 ENCOUNTER — Ambulatory Visit: Payer: Medicare Other | Admitting: Internal Medicine

## 2018-05-17 ENCOUNTER — Telehealth: Payer: Self-pay

## 2018-05-17 NOTE — Telephone Encounter (Signed)
Spoke with patient about appt. Appt canceled per provider

## 2018-05-22 ENCOUNTER — Ambulatory Visit: Payer: Medicare Other | Admitting: Cardiovascular Disease

## 2018-06-19 ENCOUNTER — Ambulatory Visit (INDEPENDENT_AMBULATORY_CARE_PROVIDER_SITE_OTHER): Payer: Medicare Other | Admitting: Internal Medicine

## 2018-06-19 ENCOUNTER — Encounter: Payer: Self-pay | Admitting: Internal Medicine

## 2018-06-19 DIAGNOSIS — E785 Hyperlipidemia, unspecified: Secondary | ICD-10-CM | POA: Diagnosis not present

## 2018-06-19 DIAGNOSIS — I10 Essential (primary) hypertension: Secondary | ICD-10-CM | POA: Diagnosis not present

## 2018-06-19 DIAGNOSIS — Z8673 Personal history of transient ischemic attack (TIA), and cerebral infarction without residual deficits: Secondary | ICD-10-CM | POA: Diagnosis not present

## 2018-06-19 DIAGNOSIS — G459 Transient cerebral ischemic attack, unspecified: Secondary | ICD-10-CM

## 2018-06-19 DIAGNOSIS — D539 Nutritional anemia, unspecified: Secondary | ICD-10-CM | POA: Diagnosis not present

## 2018-06-19 DIAGNOSIS — M5440 Lumbago with sciatica, unspecified side: Secondary | ICD-10-CM

## 2018-06-19 DIAGNOSIS — G8929 Other chronic pain: Secondary | ICD-10-CM

## 2018-06-19 DIAGNOSIS — C189 Malignant neoplasm of colon, unspecified: Secondary | ICD-10-CM

## 2018-06-19 MED ORDER — AMLODIPINE BESYLATE 5 MG PO TABS: 5.0000 mg | ORAL_TABLET | Freq: Every day | ORAL | 3 refills | Status: DC

## 2018-06-19 MED ORDER — ENALAPRIL MALEATE 10 MG PO TABS: 10.0000 mg | ORAL_TABLET | Freq: Every day | ORAL | 3 refills | Status: DC

## 2018-06-19 MED ORDER — TRIAMTERENE-HCTZ 37.5-25 MG PO TABS: 1.0000 | ORAL_TABLET | Freq: Every day | ORAL | 3 refills | Status: DC

## 2018-06-19 MED ORDER — ALLOPURINOL 100 MG PO TABS: 100.0000 mg | ORAL_TABLET | Freq: Every day | ORAL | 3 refills | Status: DC

## 2018-06-19 MED ORDER — CLOPIDOGREL BISULFATE 75 MG PO TABS: 75.0000 mg | ORAL_TABLET | Freq: Every day | ORAL | 3 refills | Status: DC

## 2018-06-19 MED ORDER — ATORVASTATIN CALCIUM 40 MG PO TABS: 40.0000 mg | ORAL_TABLET | Freq: Every evening | ORAL | 3 refills | Status: DC

## 2018-06-19 NOTE — Assessment & Plan Note (Signed)
No relapse 

## 2018-06-19 NOTE — Assessment & Plan Note (Signed)
Lipitor 

## 2018-06-19 NOTE — Progress Notes (Signed)
Virtual Visit via Video Note  I connected with Gina [name] on 06/19/18 at  2:40 PM EDT by a video enabled telemedicine application and verified that I am speaking with the correct person using two identifiers.   I discussed the limitations of evaluation and management by telemedicine and the availability of in person appointments. The patient expressed understanding and agreed to proceed.  History of Present Illness: We need to follow-up on LBP, HTN, dyslipidemia f/u.  Blood pressure today is 121/57.  Heart rate 76.  Weight 181 pounds.  Temperature 98.6  There has been no runny nose, cough, chest pain, shortness of breath, abdominal pain, diarrhea, constipation,  skin rashes.   Observations/Objective: The patient appears to be in no acute distress, looks well.  Assessment and Plan:  See my Assessment and Plan. Follow Up Instructions:    I discussed the assessment and treatment plan with the patient. The patient was provided an opportunity to ask questions and all were answered. The patient agreed with the plan and demonstrated an understanding of the instructions.   The patient was advised to call back or seek an in-person evaluation if the symptoms worsen or if the condition fails to improve as anticipated.  I provided face-to-face time during this encounter. We were at different locations.   Walker Kehr, MD

## 2018-06-19 NOTE — Assessment & Plan Note (Signed)
Norvasc, Enalapril, Triamt/HCTZ

## 2018-06-19 NOTE — Assessment & Plan Note (Signed)
Continue with gabapentin.  Follow-up with Dr. Rita Ohara Resume epidural injections when appropriate

## 2018-06-19 NOTE — Assessment & Plan Note (Signed)
CBC

## 2018-07-16 DIAGNOSIS — M5136 Other intervertebral disc degeneration, lumbar region: Secondary | ICD-10-CM | POA: Diagnosis not present

## 2018-07-16 DIAGNOSIS — M47816 Spondylosis without myelopathy or radiculopathy, lumbar region: Secondary | ICD-10-CM | POA: Diagnosis not present

## 2018-07-16 DIAGNOSIS — M48062 Spinal stenosis, lumbar region with neurogenic claudication: Secondary | ICD-10-CM | POA: Diagnosis not present

## 2018-07-16 DIAGNOSIS — M4316 Spondylolisthesis, lumbar region: Secondary | ICD-10-CM | POA: Diagnosis not present

## 2018-09-17 ENCOUNTER — Other Ambulatory Visit (INDEPENDENT_AMBULATORY_CARE_PROVIDER_SITE_OTHER): Payer: Medicare Other

## 2018-09-17 DIAGNOSIS — H43813 Vitreous degeneration, bilateral: Secondary | ICD-10-CM | POA: Diagnosis not present

## 2018-09-17 DIAGNOSIS — D539 Nutritional anemia, unspecified: Secondary | ICD-10-CM | POA: Diagnosis not present

## 2018-09-17 DIAGNOSIS — H35363 Drusen (degenerative) of macula, bilateral: Secondary | ICD-10-CM | POA: Diagnosis not present

## 2018-09-17 DIAGNOSIS — E785 Hyperlipidemia, unspecified: Secondary | ICD-10-CM

## 2018-09-17 DIAGNOSIS — G459 Transient cerebral ischemic attack, unspecified: Secondary | ICD-10-CM

## 2018-09-17 DIAGNOSIS — H353134 Nonexudative age-related macular degeneration, bilateral, advanced atrophic with subfoveal involvement: Secondary | ICD-10-CM | POA: Diagnosis not present

## 2018-09-17 LAB — CBC WITH DIFFERENTIAL/PLATELET
Basophils Absolute: 0 10*3/uL (ref 0.0–0.1)
Basophils Relative: 0.7 % (ref 0.0–3.0)
Eosinophils Absolute: 0.4 10*3/uL (ref 0.0–0.7)
Eosinophils Relative: 7.9 % — ABNORMAL HIGH (ref 0.0–5.0)
HCT: 40.1 % (ref 36.0–46.0)
Hemoglobin: 13.3 g/dL (ref 12.0–15.0)
Lymphocytes Relative: 35.6 % (ref 12.0–46.0)
Lymphs Abs: 1.8 10*3/uL (ref 0.7–4.0)
MCHC: 33.1 g/dL (ref 30.0–36.0)
MCV: 101 fl — ABNORMAL HIGH (ref 78.0–100.0)
Monocytes Absolute: 0.7 10*3/uL (ref 0.1–1.0)
Monocytes Relative: 12.9 % — ABNORMAL HIGH (ref 3.0–12.0)
Neutro Abs: 2.2 10*3/uL (ref 1.4–7.7)
Neutrophils Relative %: 42.9 % — ABNORMAL LOW (ref 43.0–77.0)
Platelets: 178 10*3/uL (ref 150.0–400.0)
RBC: 3.97 Mil/uL (ref 3.87–5.11)
RDW: 15.8 % — ABNORMAL HIGH (ref 11.5–15.5)
WBC: 5.1 10*3/uL (ref 4.0–10.5)

## 2018-09-17 LAB — TSH: TSH: 2.58 u[IU]/mL (ref 0.35–4.50)

## 2018-09-17 LAB — BASIC METABOLIC PANEL
BUN: 30 mg/dL — ABNORMAL HIGH (ref 6–23)
CO2: 28 mEq/L (ref 19–32)
Calcium: 9.8 mg/dL (ref 8.4–10.5)
Chloride: 100 mEq/L (ref 96–112)
Creatinine, Ser: 0.9 mg/dL (ref 0.40–1.20)
GFR: 59.48 mL/min — ABNORMAL LOW (ref >60.00)
Glucose, Bld: 93 mg/dL (ref 70–99)
Potassium: 4.5 mEq/L (ref 3.5–5.1)
Sodium: 138 mEq/L (ref 135–145)

## 2018-09-17 LAB — URIC ACID: Uric Acid, Serum: 6.9 mg/dL (ref 2.4–7.0)

## 2018-10-01 ENCOUNTER — Ambulatory Visit (INDEPENDENT_AMBULATORY_CARE_PROVIDER_SITE_OTHER): Payer: Medicare Other | Admitting: Internal Medicine

## 2018-10-01 ENCOUNTER — Encounter: Payer: Self-pay | Admitting: Internal Medicine

## 2018-10-01 ENCOUNTER — Other Ambulatory Visit: Payer: Self-pay

## 2018-10-01 DIAGNOSIS — M109 Gout, unspecified: Secondary | ICD-10-CM

## 2018-10-01 DIAGNOSIS — G459 Transient cerebral ischemic attack, unspecified: Secondary | ICD-10-CM | POA: Diagnosis not present

## 2018-10-01 DIAGNOSIS — Z6836 Body mass index (BMI) 36.0-36.9, adult: Secondary | ICD-10-CM

## 2018-10-01 DIAGNOSIS — E669 Obesity, unspecified: Secondary | ICD-10-CM | POA: Insufficient documentation

## 2018-10-01 DIAGNOSIS — G8929 Other chronic pain: Secondary | ICD-10-CM

## 2018-10-01 DIAGNOSIS — M5440 Lumbago with sciatica, unspecified side: Secondary | ICD-10-CM | POA: Diagnosis not present

## 2018-10-01 DIAGNOSIS — J3089 Other allergic rhinitis: Secondary | ICD-10-CM

## 2018-10-01 DIAGNOSIS — I10 Essential (primary) hypertension: Secondary | ICD-10-CM

## 2018-10-01 DIAGNOSIS — I639 Cerebral infarction, unspecified: Secondary | ICD-10-CM | POA: Diagnosis not present

## 2018-10-01 MED ORDER — IPRATROPIUM BROMIDE 0.06 % NA SOLN: 2.0000 | Freq: Three times a day (TID) | NASAL | 3 refills | Status: DC

## 2018-10-01 NOTE — Assessment & Plan Note (Addendum)
Zyrtec Atrovent nasal Astelin or Nasalcrom sprays OTC

## 2018-10-01 NOTE — Assessment & Plan Note (Signed)
Diet discussed 

## 2018-10-01 NOTE — Assessment & Plan Note (Signed)
Chronic Rx

## 2018-10-01 NOTE — Assessment & Plan Note (Signed)
No relapse 

## 2018-10-01 NOTE — Assessment & Plan Note (Signed)
Norvasc, Enalapril, Triamt/HCTZ

## 2018-10-01 NOTE — Patient Instructions (Addendum)
Astelin or Nasalcrom sprays   These suggestions will probably help you to improve your metabolism if you are not overweight and to lose weight if you are overweight: 1.  Reduce your consumption of sugars and starches.  Eliminate high fructose corn syrup from your diet.  Reduce your consumption of processed foods.  For desserts try to have seasonal fruits, berries, nuts, cheeses or dark chocolate with more than 70% cacao. 2.  Do not snack 3.  You do not have to eat breakfast.  If you choose to have breakfast-eat plain greek yogurt, eggs, oatmeal (without sugar) 4.  Drink water, freshly brewed unsweetened tea (green, black or herbal) or coffee.  Do not drink sodas including diet sodas , juices, beverages sweetened with artificial sweeteners. 5.  Reduce your consumption of refined grains. 6.  Avoid protein drinks such as Optifast, Slim fast etc. Eat chicken, fish, meat, dairy and beans for your sources of protein 7.  Natural unprocessed fats like cold pressed virgin olive oil, butter, coconut oil are good for you.  Eat avocados 8.  Increase your consumption of fiber.  Fruits, berries, vegetables, whole grains, flaxseeds, Chia seeds, beans, popcorn, nuts, oatmeal are good sources of fiber 9.  Use vinegar in your diet, i.e. apple cider vinegar, red wine or balsamic vinegar 10.  You can try fasting.  For example you can skip breakfast and lunch every other day (24-hour fast) 11.  Stress reduction, good night sleep, relaxation, meditation, yoga and other physical activity is likely to help you to maintain low weight too. 12.  If you drink alcohol, limit your alcohol intake to no more than 2 drinks a day.   Mediterranean diet is good for you. ([employer] has a typical Mediterranean cuisine menu) The Mediterranean diet is a way of eating based on the traditional cuisine of countries bordering the [employer]. While there is no single definition of the Mediterranean diet, it is typically high in  vegetables, fruits, whole grains, beans, nut and seeds, and olive oil. The main components of Mediterranean diet include: [name] Daily consumption of vegetables, fruits, whole grains and healthy fats  . Weekly intake of fish, poultry, beans and eggs  . Moderate portions of dairy products  . Limited intake of red meat Other important elements of the Mediterranean diet are sharing meals with family and friends, enjoying a glass of red wine and being physically active. Health benefits of a Mediterranean diet: A traditional Mediterranean diet consisting of large quantities of fresh fruits and vegetables, nuts, fish and olive oil-coupled with physical activity-can reduce your risk of serious mental and physical health problems by: Preventing heart disease and strokes. Following a Mediterranean diet limits your intake of refined breads, processed foods, and red meat, and encourages drinking red wine instead of hard liquor-all factors that can help prevent heart disease and stroke. Keeping you agile. If you're an older adult, the nutrients gained with a Mediterranean diet may reduce your risk of developing muscle weakness and other signs of frailty by about 70 percent. Reducing the risk of Alzheimer's. Research suggests that the Warner diet may improve cholesterol, blood sugar levels, and overall blood vessel health, which in turn may reduce your risk of Alzheimer's disease or dementia. Halving the risk of Parkinson's disease. The high levels of antioxidants in the Mediterranean diet can prevent cells from undergoing a damaging process called oxidative stress, thereby cutting the risk of Parkinson's disease in half. Increasing longevity. By reducing your risk of developing heart disease  or cancer with the Mediterranean diet, you're reducing your risk of death at any age by 20%. Protecting against type 2 diabetes. A Mediterranean diet is rich in fiber which digests slowly, prevents huge swings in blood  sugar, and can help you maintain a healthy weight.    Cabbage soup recipe that will not make you gain weight: Take 1 small head of cabbage, 1 average pack of celery, 4 green peppers, 4 onions, 2 cans diced tomatoes (they are not available without salt), salt and spices to taste.  Chop cabbage, celery, peppers and onions.  And tomatoes and 2-2.5 liters (2.5 quarts) of water so that it would just cover the vegetables.  Bring to boil.  Add spices and salt.  Turn heat to low/medium and simmer for 20-25 minutes.  Naturally, you can make a smaller batch and change some of the ingredients.

## 2018-10-01 NOTE — Progress Notes (Signed)
Subjective:  Patient ID: Gina [name], female    DOB: 1932-02-01  Age: 85 y.o. MRN: [id number]  CC: No chief complaint on file.   HPI Gina [name] presents for HTN, gout, dyslipidemia C/o coughing, congestion w/runny nose x months or years  Outpatient Medications Prior to Visit  Medication Sig Dispense Refill   acetaminophen (TYLENOL) 500 MG tablet Take 1,000 mg by mouth 3 (three) times daily as needed (for pain.).      allopurinol (ZYLOPRIM) 100 MG tablet Take 1 tablet (100 mg total) by mouth daily. 90 tablet 3   amLODipine (NORVASC) 5 MG tablet Take 1 tablet (5 mg total) by mouth daily. 90 tablet 3   atorvastatin (LIPITOR) 40 MG tablet Take 1 tablet (40 mg total) by mouth every evening. 90 tablet 3   Calcium Carbonate (CALCIUM 600 PO) Take 600 mg by mouth 2 (two) times daily.      cetirizine (ZYRTEC) 10 MG tablet Take 10 mg by mouth 2 (two) times daily.      clopidogrel (PLAVIX) 75 MG tablet Take 1 tablet (75 mg total) by mouth daily. 90 tablet 3   enalapril (VASOTEC) 10 MG tablet Take 1 tablet (10 mg total) by mouth daily. 90 tablet 3   gabapentin (NEURONTIN) 400 MG capsule Take 1 capsule by mouth 4 (four) times daily.     Multiple Vitamin (MULTIVITAMIN WITH MINERALS) TABS tablet Take 1 tablet by mouth daily.     Multiple Vitamins-Minerals (PRESERVISION AREDS 2 PO) Take 1 tablet by mouth daily.     saccharomyces boulardii (FLORASTOR) 250 MG capsule Take 250 mg by mouth daily.     triamterene-hydrochlorothiazide (MAXZIDE-25) 37.5-25 MG tablet Take 1 tablet by mouth daily. 90 tablet 3   vitamin C (ASCORBIC ACID) 500 MG tablet Take 500 mg by mouth every evening.     gabapentin (NEURONTIN) 300 MG capsule Take 300 mg by mouth daily.     No facility-administered medications prior to visit.     ROS: Review of Systems  Constitutional: Negative for activity change, appetite change, chills, fatigue and unexpected weight change.  HENT: Positive for congestion and  rhinorrhea. Negative for mouth sores and sinus pressure.   Eyes: Positive for visual disturbance.  Respiratory: Negative for cough and chest tightness.   Gastrointestinal: Negative for abdominal pain and nausea.  Genitourinary: Negative for difficulty urinating, frequency and vaginal pain.  Musculoskeletal: Negative for back pain and gait problem.  Skin: Negative for pallor and rash.  Neurological: Negative for dizziness, tremors, weakness, numbness and headaches.  Psychiatric/Behavioral: Negative for confusion, sleep disturbance and suicidal ideas. The patient is not nervous/anxious.     Objective:  BP 138/60 (BP Location: Left Arm, Patient Position: Sitting, Cuff Size: Normal)    Pulse 82    Temp (!) 97.5 F (36.4 C) (Oral)    Ht 5' (1.524 m)    Wt 189 lb (85.7 kg)    SpO2 96%    BMI 36.91 kg/m   BP Readings from Last 3 Encounters:  10/01/18 138/60  04/23/18 (!) 156/60  02/28/18 (!) 114/48    Wt Readings from Last 3 Encounters:  10/01/18 189 lb (85.7 kg)  04/23/18 184 lb (83.5 kg)  02/28/18 175 lb (79.4 kg)    Physical Exam Constitutional:      General: She is not in acute distress.    Appearance: She is well-developed.  HENT:     Head: Normocephalic.     Right Ear: External ear normal.  Left Ear: External ear normal.     Nose: Nose normal.  Eyes:     General:        Right eye: No discharge.        Left eye: No discharge.     Conjunctiva/sclera: Conjunctivae normal.     Pupils: Pupils are equal, round, and reactive to light.  Neck:     Musculoskeletal: Normal range of motion and neck supple.     Thyroid: No thyromegaly.     Vascular: No JVD.     Trachea: No tracheal deviation.  Cardiovascular:     Rate and Rhythm: Normal rate and regular rhythm.     Heart sounds: Normal heart sounds.  Pulmonary:     Effort: No respiratory distress.     Breath sounds: No stridor. No wheezing.  Abdominal:     General: Bowel sounds are normal. There is no distension.      Palpations: Abdomen is soft. There is no mass.     Tenderness: There is no abdominal tenderness. There is no guarding or rebound.  Musculoskeletal:        General: Tenderness present.  Lymphadenopathy:     Cervical: No cervical adenopathy.  Skin:    Findings: No erythema or rash.  Neurological:     Mental Status: She is oriented to person, place, and time.     Cranial Nerves: No cranial nerve deficit.     Motor: No abnormal muscle tone.     Coordination: Coordination normal.     Gait: Gait abnormal.     Deep Tendon Reflexes: Reflexes normal.  Psychiatric:        Behavior: Behavior normal.        Thought Content: Thought content normal.        Judgment: Judgment normal.     Lab Results  Component Value Date   WBC 5.1 09/17/2018   HGB 13.3 09/17/2018   HCT 40.1 09/17/2018   PLT 178.0 09/17/2018   GLUCOSE 93 09/17/2018   CHOL 128 02/23/2018   TRIG 37 02/23/2018   HDL 79 02/23/2018   LDLDIRECT 69.0 09/09/2014   LDLCALC 42 02/23/2018   ALT 18 02/22/2018   AST 21 02/22/2018   NA 138 09/17/2018   K 4.5 09/17/2018   CL 100 09/17/2018   CREATININE 0.90 09/17/2018   BUN 30 (H) 09/17/2018   CO2 28 09/17/2018   TSH 2.58 09/17/2018   INR 0.96 07/24/2017   HGBA1C 5.9 (H) 02/23/2018    Ct Angio Head W Or Wo Contrast  Result Date: 02/22/2018 CLINICAL DATA:  TIA. Transient agraphia, confusion, and right arm numbness today. EXAM: CT ANGIOGRAPHY HEAD AND NECK TECHNIQUE: Multidetector CT imaging of the head and neck was performed using the standard protocol during bolus administration of intravenous contrast. Multiplanar CT image reconstructions and MIPs were obtained to evaluate the vascular anatomy. Carotid stenosis measurements (when applicable) are obtained utilizing NASCET criteria, using the distal internal carotid diameter as the denominator. CONTRAST:  23mL ISOVUE-370 IOPAMIDOL (ISOVUE-370) INJECTION 76% COMPARISON:  Head CT 11/12/2009 and MRI/MRA 11/13/2009 FINDINGS: CT HEAD  FINDINGS Brain: There is no evidence of acute infarct, intracranial hemorrhage, mass, midline shift, or extra-axial fluid collection. Mild cerebral atrophy is within normal limits for age. Patchy to confluent cerebral white matter hypodensities are similar to the prior CT and nonspecific but compatible with moderately advanced chronic small vessel ischemic disease. Heterogeneous hypoattenuation in the basal ganglia bilaterally is also similar to the prior CT with numerous dilated perivascular  spaces seen on MRI. A left parafalcine arachnoid cyst is incidentally noted in the posterior frontal region, unchanged and of no current clinical significance. Vascular: Calcified atherosclerosis at the skull base. Skull: No fracture or focal osseous lesion. Sinuses: Mid left ethmoid air cell osteoma. Posterior left ethmoid air cell and left maxillary sinus mucous retention cysts. Clear mastoid air cells. Orbits: Bilateral cataract extraction. Review of the MIP images confirms the above findings CTA NECK FINDINGS Aortic arch: Standard 3 vessel aortic arch with extensive calcified plaque. Mild proximal left subclavian artery stenosis due to calcified plaque. Right carotid system: Patent with moderate calcified plaque about the carotid bifurcation. No evidence of significant stenosis or dissection Left carotid system: Patent with mild calcified plaque about the carotid bifurcation. No evidence of significant stenosis or dissection. Vertebral arteries: Patent and codominant without evidence of significant stenosis or dissection. Skeleton: Severe disc and moderate to severe facet degeneration at multiple levels in the cervical spine. Other neck: Thyroid asymmetry with the right lobe being larger than left and without a discrete lesion identified. Upper chest: Clear lung apices. Review of the MIP images confirms the above findings CTA HEAD FINDINGS Anterior circulation: The internal carotid arteries are patent from skull base to  carotid termini with mild-to-moderate calcified plaque bilaterally not resulting in significant stenosis. ACAs and MCAs are patent with moderate branch vessel irregularity but no evidence of proximal branch occlusion. There are mild-to-moderate distal left A1 and proximal left A2 stenoses. No aneurysm is identified. Posterior circulation: The intracranial vertebral arteries are widely patent to the basilar. Patent bilateral PICA, right AICA, and bilateral SCA origins are identified. The basilar artery is widely patent. Posterior communicating arteries are diminutive or absent. PCAs are patent with branch vessel irregularity but no significant proximal stenosis. No aneurysm is identified. Venous sinuses: Patent. Anatomic variants: None. Delayed phase: No abnormal enhancement. Review of the MIP images confirms the above findings IMPRESSION: 1. No evidence of acute intracranial abnormality. 2. Moderately advanced chronic small vessel ischemic disease. 3. No large vessel occlusion. 4. Right greater than left cervical carotid artery atherosclerosis without stenosis. 5. Intracranial atherosclerosis resulting in mild-to-moderate distal left A1 and proximal left A2 stenoses. Electronically Signed   By: Logan Bores M.D.   On: 02/22/2018 18:53   Ct Angio Neck W Or Wo Contrast  Result Date: 02/22/2018 CLINICAL DATA:  TIA. Transient agraphia, confusion, and right arm numbness today. EXAM: CT ANGIOGRAPHY HEAD AND NECK TECHNIQUE: Multidetector CT imaging of the head and neck was performed using the standard protocol during bolus administration of intravenous contrast. Multiplanar CT image reconstructions and MIPs were obtained to evaluate the vascular anatomy. Carotid stenosis measurements (when applicable) are obtained utilizing NASCET criteria, using the distal internal carotid diameter as the denominator. CONTRAST:  78mL ISOVUE-370 IOPAMIDOL (ISOVUE-370) INJECTION 76% COMPARISON:  Head CT 11/12/2009 and MRI/MRA 11/13/2009  FINDINGS: CT HEAD FINDINGS Brain: There is no evidence of acute infarct, intracranial hemorrhage, mass, midline shift, or extra-axial fluid collection. Mild cerebral atrophy is within normal limits for age. Patchy to confluent cerebral white matter hypodensities are similar to the prior CT and nonspecific but compatible with moderately advanced chronic small vessel ischemic disease. Heterogeneous hypoattenuation in the basal ganglia bilaterally is also similar to the prior CT with numerous dilated perivascular spaces seen on MRI. A left parafalcine arachnoid cyst is incidentally noted in the posterior frontal region, unchanged and of no current clinical significance. Vascular: Calcified atherosclerosis at the skull base. Skull: No fracture or focal osseous lesion.  Sinuses: Mid left ethmoid air cell osteoma. Posterior left ethmoid air cell and left maxillary sinus mucous retention cysts. Clear mastoid air cells. Orbits: Bilateral cataract extraction. Review of the MIP images confirms the above findings CTA NECK FINDINGS Aortic arch: Standard 3 vessel aortic arch with extensive calcified plaque. Mild proximal left subclavian artery stenosis due to calcified plaque. Right carotid system: Patent with moderate calcified plaque about the carotid bifurcation. No evidence of significant stenosis or dissection Left carotid system: Patent with mild calcified plaque about the carotid bifurcation. No evidence of significant stenosis or dissection. Vertebral arteries: Patent and codominant without evidence of significant stenosis or dissection. Skeleton: Severe disc and moderate to severe facet degeneration at multiple levels in the cervical spine. Other neck: Thyroid asymmetry with the right lobe being larger than left and without a discrete lesion identified. Upper chest: Clear lung apices. Review of the MIP images confirms the above findings CTA HEAD FINDINGS Anterior circulation: The internal carotid arteries are patent from  skull base to carotid termini with mild-to-moderate calcified plaque bilaterally not resulting in significant stenosis. ACAs and MCAs are patent with moderate branch vessel irregularity but no evidence of proximal branch occlusion. There are mild-to-moderate distal left A1 and proximal left A2 stenoses. No aneurysm is identified. Posterior circulation: The intracranial vertebral arteries are widely patent to the basilar. Patent bilateral PICA, right AICA, and bilateral SCA origins are identified. The basilar artery is widely patent. Posterior communicating arteries are diminutive or absent. PCAs are patent with branch vessel irregularity but no significant proximal stenosis. No aneurysm is identified. Venous sinuses: Patent. Anatomic variants: None. Delayed phase: No abnormal enhancement. Review of the MIP images confirms the above findings IMPRESSION: 1. No evidence of acute intracranial abnormality. 2. Moderately advanced chronic small vessel ischemic disease. 3. No large vessel occlusion. 4. Right greater than left cervical carotid artery atherosclerosis without stenosis. 5. Intracranial atherosclerosis resulting in mild-to-moderate distal left A1 and proximal left A2 stenoses. Electronically Signed   By: Logan Bores M.D.   On: 02/22/2018 18:53   Mr Brain Wo Contrast  Result Date: 02/22/2018 CLINICAL DATA:  TIA. Transient agraphia, confusion, and right arm numbness today. EXAM: MRI HEAD WITHOUT CONTRAST TECHNIQUE: Multiplanar, multiecho pulse sequences of the brain and surrounding structures were obtained without intravenous contrast. COMPARISON:  Head CT 02/22/2018 and MRI 11/13/2009 FINDINGS: Brain: There is no evidence of acute infarct, intracranial hemorrhage, mass, midline shift, or extra-axial fluid collection. Mild cerebral atrophy is within normal limits for age. Patchy to confluent T2 hyperintensities in the cerebral white matter and pons are nonspecific but compatible with moderately advanced  chronic small vessel ischemic disease, mildly progressed from the 2011 MRI most notably in the pons. T2 hyperintensities throughout the basal ganglia bilaterally are unchanged from the prior MRI and appear to predominantly reflect dilated perivascular spaces. There is also mild-to-moderate chronic T2 heterogeneity in the thalami. A left parafalcine arachnoid cyst in the posterior frontal region is unchanged. Vascular: Major intracranial vascular flow voids are preserved. Skull and upper cervical spine: Unremarkable bone marrow signal. Sinuses/Orbits: Bilateral cataract extraction. Scattered small mucous retention cysts in the paranasal sinuses. Clear mastoid air cells. Other: None. IMPRESSION: 1. No acute intracranial abnormality. 2. Moderately advanced chronic small vessel ischemic disease. Electronically Signed   By: Logan Bores M.D.   On: 02/22/2018 19:39    Assessment & Plan:   There are no diagnoses linked to this encounter.   No orders of the defined types were placed in this encounter.  Follow-up: No follow-ups on file.  Walker Kehr, MD

## 2018-10-16 DIAGNOSIS — Z1231 Encounter for screening mammogram for malignant neoplasm of breast: Secondary | ICD-10-CM | POA: Diagnosis not present

## 2018-10-16 DIAGNOSIS — R29890 Loss of height: Secondary | ICD-10-CM | POA: Diagnosis not present

## 2018-10-16 DIAGNOSIS — M5136 Other intervertebral disc degeneration, lumbar region: Secondary | ICD-10-CM | POA: Diagnosis not present

## 2018-10-16 DIAGNOSIS — Z803 Family history of malignant neoplasm of breast: Secondary | ICD-10-CM | POA: Diagnosis not present

## 2018-10-16 DIAGNOSIS — M5416 Radiculopathy, lumbar region: Secondary | ICD-10-CM | POA: Diagnosis not present

## 2018-10-16 DIAGNOSIS — M47816 Spondylosis without myelopathy or radiculopathy, lumbar region: Secondary | ICD-10-CM | POA: Diagnosis not present

## 2018-10-16 DIAGNOSIS — M48062 Spinal stenosis, lumbar region with neurogenic claudication: Secondary | ICD-10-CM | POA: Diagnosis not present

## 2018-10-16 DIAGNOSIS — M4316 Spondylolisthesis, lumbar region: Secondary | ICD-10-CM | POA: Diagnosis not present

## 2018-10-16 DIAGNOSIS — M8589 Other specified disorders of bone density and structure, multiple sites: Secondary | ICD-10-CM | POA: Diagnosis not present

## 2018-10-16 DIAGNOSIS — Z85038 Personal history of other malignant neoplasm of large intestine: Secondary | ICD-10-CM | POA: Diagnosis not present

## 2018-10-16 LAB — HM DEXA SCAN: HM Dexa Scan: -2.2

## 2018-10-25 DIAGNOSIS — R922 Inconclusive mammogram: Secondary | ICD-10-CM | POA: Diagnosis not present

## 2018-10-26 ENCOUNTER — Encounter: Payer: Self-pay | Admitting: Internal Medicine

## 2018-11-02 ENCOUNTER — Telehealth: Payer: Self-pay | Admitting: Internal Medicine

## 2018-11-02 ENCOUNTER — Ambulatory Visit: Payer: Self-pay | Admitting: *Deleted

## 2018-11-02 NOTE — Telephone Encounter (Signed)
Pt states that she is having vertigo that started the morning of 11/01/2018 when she got out of bed; it worsened when she changed the position of her head (bending over); she has taken Bonine twice which help; her last dose was at 1030 11/02/2018; her BP was 147/57 on 11/01/2018; she also says that she had vertigo in the past, but she is not sure when her last episode was; the pt would like to know if she can continue taking this; recommendations made per nurse triage protocol; she verbalized understanding, and says that she will call EMS if her symptoms worsen; the pt sees Dr Alain Marion, [facility]; pt transferred to [facility] for scheduling.  Reason for Disposition . [1] NO dizziness now AND [2] one or more stroke risk factors (i.e., hypertension, diabetes, prior stroke/TIA/heart attack)  Answer Assessment - Initial Assessment Questions 1. DESCRIPTION: "Describe your dizziness."     vertigo 2. VERTIGO: "Do you feel like either you or the room is spinning or tilting?"      Room spinning 3. LIGHTHEADED: "Do you feel lightheaded?" (e.g., somewhat faint, woozy, weak upon standing)     *No Answer* 4. SEVERITY: "How bad is it?"  "Can you walk?"   - MILD - Feels unsteady but walking normally.   - MODERATE - Feels very unsteady when walking, but not falling; interferes with normal activities (e.g., school, work) .   - SEVERE - Unable to walk without falling (requires assistance).     *No Answer* 5. ONSET:  "When did the dizziness begin?"     *No Answer* 6. AGGRAVATING FACTORS: "Does anything make it worse?" (e.g., standing, change in head position)      bending over 7. CAUSE: "What do you think is causing the dizziness?"    8. RECURRENT SYMPTOM: "Have you had dizziness before?" If so, ask: "When was the last time?" "What happened that time?"     *No Answer* 9. OTHER SYMPTOMS: "Do you have any other symptoms?" (e.g., headache, weakness, numbness, vomiting, earache)     *No Answer* 10. PREGNANCY: "Is there  any chance you are pregnant?" "When was your last menstrual period?"       *No Answer*  Protocols used: DIZZINESS - VERTIGO-A-AH

## 2018-11-02 NOTE — Telephone Encounter (Signed)
Pt has appointment monday

## 2018-11-02 NOTE — Telephone Encounter (Signed)
error 

## 2018-11-05 ENCOUNTER — Encounter: Payer: Self-pay | Admitting: Internal Medicine

## 2018-11-05 ENCOUNTER — Ambulatory Visit (INDEPENDENT_AMBULATORY_CARE_PROVIDER_SITE_OTHER): Payer: Medicare Other | Admitting: Internal Medicine

## 2018-11-05 ENCOUNTER — Other Ambulatory Visit: Payer: Self-pay

## 2018-11-05 DIAGNOSIS — I639 Cerebral infarction, unspecified: Secondary | ICD-10-CM | POA: Diagnosis not present

## 2018-11-05 DIAGNOSIS — R42 Dizziness and giddiness: Secondary | ICD-10-CM

## 2018-11-05 NOTE — Assessment & Plan Note (Signed)
Recurrent BPV PRN meclizine Benign Positional Vertigo symptoms Start Gina [name] - Daroff exercise several times a day as dirrected.

## 2018-11-05 NOTE — Progress Notes (Signed)
Subjective:  Patient ID: Gina [name], female    DOB: 12-Feb-1932  Age: 85 y.o. MRN: [id number]  CC: No chief complaint on file.   HPI Gina [name] presents for vertigo since last Thursday - better Pt took OTC mecliizine  Outpatient Medications Prior to Visit  Medication Sig Dispense Refill   acetaminophen (TYLENOL) 500 MG tablet Take 1,000 mg by mouth 3 (three) times daily as needed (for pain.).      allopurinol (ZYLOPRIM) 100 MG tablet Take 1 tablet (100 mg total) by mouth daily. 90 tablet 3   amLODipine (NORVASC) 5 MG tablet Take 1 tablet (5 mg total) by mouth daily. 90 tablet 3   atorvastatin (LIPITOR) 40 MG tablet Take 1 tablet (40 mg total) by mouth every evening. 90 tablet 3   Calcium Carbonate (CALCIUM 600 PO) Take 600 mg by mouth 2 (two) times daily.      cetirizine (ZYRTEC) 10 MG tablet Take 10 mg by mouth 2 (two) times daily.      clopidogrel (PLAVIX) 75 MG tablet Take 1 tablet (75 mg total) by mouth daily. 90 tablet 3   enalapril (VASOTEC) 10 MG tablet Take 1 tablet (10 mg total) by mouth daily. 90 tablet 3   gabapentin (NEURONTIN) 300 MG capsule Take 300 mg by mouth daily.     gabapentin (NEURONTIN) 400 MG capsule Take 1 capsule by mouth 4 (four) times daily.     ipratropium (ATROVENT) 0.06 % nasal spray Place 2 sprays into the nose 3 (three) times daily. 15 mL 3   Multiple Vitamin (MULTIVITAMIN WITH MINERALS) TABS tablet Take 1 tablet by mouth daily.     Multiple Vitamins-Minerals (PRESERVISION AREDS 2 PO) Take 1 tablet by mouth daily.     saccharomyces boulardii (FLORASTOR) 250 MG capsule Take 250 mg by mouth daily.     triamterene-hydrochlorothiazide (MAXZIDE-25) 37.5-25 MG tablet Take 1 tablet by mouth daily. 90 tablet 3   vitamin C (ASCORBIC ACID) 500 MG tablet Take 500 mg by mouth every evening.     No facility-administered medications prior to visit.     ROS: Review of Systems  Constitutional: Negative for activity change, appetite  change, chills, fatigue and unexpected weight change.  HENT: Negative for congestion, mouth sores and sinus pressure.   Eyes: Negative for visual disturbance.  Respiratory: Negative for cough and chest tightness.   Gastrointestinal: Negative for abdominal pain and nausea.  Genitourinary: Negative for difficulty urinating, frequency and vaginal pain.  Musculoskeletal: Negative for back pain and gait problem.  Skin: Negative for pallor and rash.  Neurological: Positive for dizziness. Negative for tremors, weakness, numbness and headaches.  Psychiatric/Behavioral: Negative for confusion, sleep disturbance and suicidal ideas.    Objective:  BP (!) 150/72 (BP Location: Left Arm, Patient Position: Sitting, Cuff Size: Normal)    Pulse 87    Temp 98.2 F (36.8 C) (Oral)    Ht 5' (1.524 m)    Wt 188 lb (85.3 kg)    SpO2 98%    BMI 36.72 kg/m   BP Readings from Last 3 Encounters:  11/05/18 (!) 150/72  10/01/18 138/60  04/23/18 (!) 156/60    Wt Readings from Last 3 Encounters:  11/05/18 188 lb (85.3 kg)  10/01/18 189 lb (85.7 kg)  04/23/18 184 lb (83.5 kg)    Physical Exam Constitutional:      General: She is not in acute distress.    Appearance: She is well-developed.  HENT:     Head: Normocephalic.  Right Ear: External ear normal.     Left Ear: External ear normal.     Nose: Nose normal.  Eyes:     General:        Right eye: No discharge.        Left eye: No discharge.     Conjunctiva/sclera: Conjunctivae normal.     Pupils: Pupils are equal, round, and reactive to light.  Neck:     Musculoskeletal: Normal range of motion and neck supple.     Thyroid: No thyromegaly.     Vascular: No JVD.     Trachea: No tracheal deviation.  Cardiovascular:     Rate and Rhythm: Normal rate and regular rhythm.     Heart sounds: Normal heart sounds.  Pulmonary:     Effort: No respiratory distress.     Breath sounds: No stridor. No wheezing.  Abdominal:     General: Bowel sounds are  normal. There is no distension.     Palpations: Abdomen is soft. There is no mass.     Tenderness: There is no abdominal tenderness. There is no guarding or rebound.  Musculoskeletal:        General: No tenderness.  Lymphadenopathy:     Cervical: No cervical adenopathy.  Skin:    Findings: No erythema or rash.  Neurological:     Mental Status: She is oriented to person, place, and time.     Cranial Nerves: No cranial nerve deficit.     Motor: No abnormal muscle tone.     Coordination: Coordination normal.     Deep Tendon Reflexes: Reflexes normal.  Psychiatric:        Behavior: Behavior normal.        Thought Content: Thought content normal.        Judgment: Judgment normal.     Lab Results  Component Value Date   WBC 5.1 09/17/2018   HGB 13.3 09/17/2018   HCT 40.1 09/17/2018   PLT 178.0 09/17/2018   GLUCOSE 93 09/17/2018   CHOL 128 02/23/2018   TRIG 37 02/23/2018   HDL 79 02/23/2018   LDLDIRECT 69.0 09/09/2014   LDLCALC 42 02/23/2018   ALT 18 02/22/2018   AST 21 02/22/2018   NA 138 09/17/2018   K 4.5 09/17/2018   CL 100 09/17/2018   CREATININE 0.90 09/17/2018   BUN 30 (H) 09/17/2018   CO2 28 09/17/2018   TSH 2.58 09/17/2018   INR 0.96 07/24/2017   HGBA1C 5.9 (H) 02/23/2018    Ct Angio Head W Or Wo Contrast  Result Date: 02/22/2018 CLINICAL DATA:  TIA. Transient agraphia, confusion, and right arm numbness today. EXAM: CT ANGIOGRAPHY HEAD AND NECK TECHNIQUE: Multidetector CT imaging of the head and neck was performed using the standard protocol during bolus administration of intravenous contrast. Multiplanar CT image reconstructions and MIPs were obtained to evaluate the vascular anatomy. Carotid stenosis measurements (when applicable) are obtained utilizing NASCET criteria, using the distal internal carotid diameter as the denominator. CONTRAST:  47mL ISOVUE-370 IOPAMIDOL (ISOVUE-370) INJECTION 76% COMPARISON:  Head CT 11/12/2009 and MRI/MRA 11/13/2009 FINDINGS: CT  HEAD FINDINGS Brain: There is no evidence of acute infarct, intracranial hemorrhage, mass, midline shift, or extra-axial fluid collection. Mild cerebral atrophy is within normal limits for age. Patchy to confluent cerebral white matter hypodensities are similar to the prior CT and nonspecific but compatible with moderately advanced chronic small vessel ischemic disease. Heterogeneous hypoattenuation in the basal ganglia bilaterally is also similar to the prior CT with numerous  dilated perivascular spaces seen on MRI. A left parafalcine arachnoid cyst is incidentally noted in the posterior frontal region, unchanged and of no current clinical significance. Vascular: Calcified atherosclerosis at the skull base. Skull: No fracture or focal osseous lesion. Sinuses: Mid left ethmoid air cell osteoma. Posterior left ethmoid air cell and left maxillary sinus mucous retention cysts. Clear mastoid air cells. Orbits: Bilateral cataract extraction. Review of the MIP images confirms the above findings CTA NECK FINDINGS Aortic arch: Standard 3 vessel aortic arch with extensive calcified plaque. Mild proximal left subclavian artery stenosis due to calcified plaque. Right carotid system: Patent with moderate calcified plaque about the carotid bifurcation. No evidence of significant stenosis or dissection Left carotid system: Patent with mild calcified plaque about the carotid bifurcation. No evidence of significant stenosis or dissection. Vertebral arteries: Patent and codominant without evidence of significant stenosis or dissection. Skeleton: Severe disc and moderate to severe facet degeneration at multiple levels in the cervical spine. Other neck: Thyroid asymmetry with the right lobe being larger than left and without a discrete lesion identified. Upper chest: Clear lung apices. Review of the MIP images confirms the above findings CTA HEAD FINDINGS Anterior circulation: The internal carotid arteries are patent from skull base  to carotid termini with mild-to-moderate calcified plaque bilaterally not resulting in significant stenosis. ACAs and MCAs are patent with moderate branch vessel irregularity but no evidence of proximal branch occlusion. There are mild-to-moderate distal left A1 and proximal left A2 stenoses. No aneurysm is identified. Posterior circulation: The intracranial vertebral arteries are widely patent to the basilar. Patent bilateral PICA, right AICA, and bilateral SCA origins are identified. The basilar artery is widely patent. Posterior communicating arteries are diminutive or absent. PCAs are patent with branch vessel irregularity but no significant proximal stenosis. No aneurysm is identified. Venous sinuses: Patent. Anatomic variants: None. Delayed phase: No abnormal enhancement. Review of the MIP images confirms the above findings IMPRESSION: 1. No evidence of acute intracranial abnormality. 2. Moderately advanced chronic small vessel ischemic disease. 3. No large vessel occlusion. 4. Right greater than left cervical carotid artery atherosclerosis without stenosis. 5. Intracranial atherosclerosis resulting in mild-to-moderate distal left A1 and proximal left A2 stenoses. Electronically Signed   By: Logan Bores M.D.   On: 02/22/2018 18:53   Ct Angio Neck W Or Wo Contrast  Result Date: 02/22/2018 CLINICAL DATA:  TIA. Transient agraphia, confusion, and right arm numbness today. EXAM: CT ANGIOGRAPHY HEAD AND NECK TECHNIQUE: Multidetector CT imaging of the head and neck was performed using the standard protocol during bolus administration of intravenous contrast. Multiplanar CT image reconstructions and MIPs were obtained to evaluate the vascular anatomy. Carotid stenosis measurements (when applicable) are obtained utilizing NASCET criteria, using the distal internal carotid diameter as the denominator. CONTRAST:  68mL ISOVUE-370 IOPAMIDOL (ISOVUE-370) INJECTION 76% COMPARISON:  Head CT 11/12/2009 and MRI/MRA  11/13/2009 FINDINGS: CT HEAD FINDINGS Brain: There is no evidence of acute infarct, intracranial hemorrhage, mass, midline shift, or extra-axial fluid collection. Mild cerebral atrophy is within normal limits for age. Patchy to confluent cerebral white matter hypodensities are similar to the prior CT and nonspecific but compatible with moderately advanced chronic small vessel ischemic disease. Heterogeneous hypoattenuation in the basal ganglia bilaterally is also similar to the prior CT with numerous dilated perivascular spaces seen on MRI. A left parafalcine arachnoid cyst is incidentally noted in the posterior frontal region, unchanged and of no current clinical significance. Vascular: Calcified atherosclerosis at the skull base. Skull: No fracture or focal  osseous lesion. Sinuses: Mid left ethmoid air cell osteoma. Posterior left ethmoid air cell and left maxillary sinus mucous retention cysts. Clear mastoid air cells. Orbits: Bilateral cataract extraction. Review of the MIP images confirms the above findings CTA NECK FINDINGS Aortic arch: Standard 3 vessel aortic arch with extensive calcified plaque. Mild proximal left subclavian artery stenosis due to calcified plaque. Right carotid system: Patent with moderate calcified plaque about the carotid bifurcation. No evidence of significant stenosis or dissection Left carotid system: Patent with mild calcified plaque about the carotid bifurcation. No evidence of significant stenosis or dissection. Vertebral arteries: Patent and codominant without evidence of significant stenosis or dissection. Skeleton: Severe disc and moderate to severe facet degeneration at multiple levels in the cervical spine. Other neck: Thyroid asymmetry with the right lobe being larger than left and without a discrete lesion identified. Upper chest: Clear lung apices. Review of the MIP images confirms the above findings CTA HEAD FINDINGS Anterior circulation: The internal carotid arteries are  patent from skull base to carotid termini with mild-to-moderate calcified plaque bilaterally not resulting in significant stenosis. ACAs and MCAs are patent with moderate branch vessel irregularity but no evidence of proximal branch occlusion. There are mild-to-moderate distal left A1 and proximal left A2 stenoses. No aneurysm is identified. Posterior circulation: The intracranial vertebral arteries are widely patent to the basilar. Patent bilateral PICA, right AICA, and bilateral SCA origins are identified. The basilar artery is widely patent. Posterior communicating arteries are diminutive or absent. PCAs are patent with branch vessel irregularity but no significant proximal stenosis. No aneurysm is identified. Venous sinuses: Patent. Anatomic variants: None. Delayed phase: No abnormal enhancement. Review of the MIP images confirms the above findings IMPRESSION: 1. No evidence of acute intracranial abnormality. 2. Moderately advanced chronic small vessel ischemic disease. 3. No large vessel occlusion. 4. Right greater than left cervical carotid artery atherosclerosis without stenosis. 5. Intracranial atherosclerosis resulting in mild-to-moderate distal left A1 and proximal left A2 stenoses. Electronically Signed   By: Logan Bores M.D.   On: 02/22/2018 18:53   Mr Brain Wo Contrast  Result Date: 02/22/2018 CLINICAL DATA:  TIA. Transient agraphia, confusion, and right arm numbness today. EXAM: MRI HEAD WITHOUT CONTRAST TECHNIQUE: Multiplanar, multiecho pulse sequences of the brain and surrounding structures were obtained without intravenous contrast. COMPARISON:  Head CT 02/22/2018 and MRI 11/13/2009 FINDINGS: Brain: There is no evidence of acute infarct, intracranial hemorrhage, mass, midline shift, or extra-axial fluid collection. Mild cerebral atrophy is within normal limits for age. Patchy to confluent T2 hyperintensities in the cerebral white matter and pons are nonspecific but compatible with moderately  advanced chronic small vessel ischemic disease, mildly progressed from the 2011 MRI most notably in the pons. T2 hyperintensities throughout the basal ganglia bilaterally are unchanged from the prior MRI and appear to predominantly reflect dilated perivascular spaces. There is also mild-to-moderate chronic T2 heterogeneity in the thalami. A left parafalcine arachnoid cyst in the posterior frontal region is unchanged. Vascular: Major intracranial vascular flow voids are preserved. Skull and upper cervical spine: Unremarkable bone marrow signal. Sinuses/Orbits: Bilateral cataract extraction. Scattered small mucous retention cysts in the paranasal sinuses. Clear mastoid air cells. Other: None. IMPRESSION: 1. No acute intracranial abnormality. 2. Moderately advanced chronic small vessel ischemic disease. Electronically Signed   By: Logan Bores M.D.   On: 02/22/2018 19:39    Assessment & Plan:   There are no diagnoses linked to this encounter.   No orders of the defined types were placed in  this encounter.    Follow-up: No follow-ups on file.  Walker Kehr, MD

## 2018-11-05 NOTE — Patient Instructions (Signed)
Benign Positional Vertigo symptoms Start Gina [name] - Daroff exercise several times a day as dirrected.      Vertigo Vertigo is the feeling that you or the things around you are moving when they are not. This feeling can come and go at any time. Vertigo often goes away on its own. This condition can be dangerous if it happens when you are doing activities like driving or working with machines. Your doctor will do tests to find the cause of your vertigo. These tests will also help your doctor decide on the best treatment for you. Follow these instructions at home: Eating and drinking      Drink enough fluid to keep your pee (urine) pale yellow.  Do not drink alcohol. Activity  Return to your normal activities as told by your doctor. Ask your doctor what activities are safe for you.  In the morning, first sit up on the side of the bed. When you feel okay, stand slowly while you hold onto something until you know that your balance is fine.  Move slowly. Avoid sudden body or head movements or certain positions, as told by your doctor.  Use a cane if you have trouble standing or walking.  Sit down right away if you feel dizzy.  Avoid doing any tasks or activities that can cause danger to you or others if you get dizzy.  Avoid bending down if you feel dizzy. Place items in your home so that they are easy for you to reach without leaning over.  Do not drive or use heavy machinery if you feel dizzy. General instructions  Take over-the-counter and prescription medicines only as told by your doctor.  Keep all follow-up visits as told by your doctor. This is important. Contact a doctor if:  Your medicine does not help your vertigo.  You have a fever.  Your problems get worse or you have new symptoms.  Your family or friends see changes in your behavior.  The feeling of being sick to your stomach gets worse.  Your vomiting gets worse.  You lose feeling (have numbness) in part  of your body.  You feel prickling and tingling in a part of your body. Get help right away if:  You have trouble moving or talking.  You are always dizzy.  You pass out (faint).  You get very bad headaches.  You feel weak in your hands, arms, or legs.  You have changes in your hearing.  You have changes in how you see (vision).  You get a stiff neck.  Bright light starts to bother you. Summary  Vertigo is the feeling that you or the things around you are moving when they are not.  Your doctor will do tests to find the cause of your vertigo.  You may be told to avoid some tasks, positions, or movements.  Contact a doctor if your medicine is not helping, or if you have a fever, new symptoms, or a change in behavior.  Get help right away if you get very bad headaches, or if you have changes in how you speak, hear, or see. This information is not intended to replace advice given to you by your health care provider. Make sure you discuss any questions you have with your health care provider. Document Released: 11/10/2007 Document Revised: 12/25/2017 Document Reviewed: 12/25/2017 Elsevier Patient Education  2020 [employer].

## 2018-11-15 DIAGNOSIS — Z961 Presence of intraocular lens: Secondary | ICD-10-CM | POA: Diagnosis not present

## 2018-11-15 DIAGNOSIS — H10413 Chronic giant papillary conjunctivitis, bilateral: Secondary | ICD-10-CM | POA: Diagnosis not present

## 2018-11-15 DIAGNOSIS — H353134 Nonexudative age-related macular degeneration, bilateral, advanced atrophic with subfoveal involvement: Secondary | ICD-10-CM | POA: Diagnosis not present

## 2018-11-28 DIAGNOSIS — Z23 Encounter for immunization: Secondary | ICD-10-CM | POA: Diagnosis not present

## 2018-12-11 ENCOUNTER — Telehealth: Payer: Self-pay | Admitting: *Deleted

## 2018-12-11 NOTE — Telephone Encounter (Signed)
Copied from [place] [id number]. Topic: General - Inquiry >> Dec 11, 2018  1:29 PM Gina [name] wrote: Reason for CRM: Patient called stating she would like to know if she could come off her PLAVIX for two days due to minor dentistry surgery. Call back [phone]

## 2018-12-12 NOTE — Telephone Encounter (Signed)
Usually there is no need to come off Plavix for dental procedures.  She can double check with her dentist.  If her dentist wants to stop Plavix for couple days, it is okay. Thanks

## 2018-12-12 NOTE — Telephone Encounter (Signed)
Pt informed of below.  

## 2018-12-24 DIAGNOSIS — M654 Radial styloid tenosynovitis [de Quervain]: Secondary | ICD-10-CM | POA: Diagnosis not present

## 2018-12-24 DIAGNOSIS — M25532 Pain in left wrist: Secondary | ICD-10-CM | POA: Diagnosis not present

## 2019-01-01 ENCOUNTER — Ambulatory Visit: Payer: Medicare Other | Admitting: Internal Medicine

## 2019-01-29 ENCOUNTER — Ambulatory Visit (INDEPENDENT_AMBULATORY_CARE_PROVIDER_SITE_OTHER): Payer: Medicare Other | Admitting: Internal Medicine

## 2019-01-29 ENCOUNTER — Encounter: Payer: Self-pay | Admitting: Internal Medicine

## 2019-01-29 ENCOUNTER — Other Ambulatory Visit: Payer: Self-pay

## 2019-01-29 VITALS — BP 140/72 | HR 83 | Temp 97.8°F | Ht 60.0 in | Wt 188.0 lb

## 2019-01-29 DIAGNOSIS — M5440 Lumbago with sciatica, unspecified side: Secondary | ICD-10-CM

## 2019-01-29 DIAGNOSIS — E785 Hyperlipidemia, unspecified: Secondary | ICD-10-CM

## 2019-01-29 DIAGNOSIS — Z6836 Body mass index (BMI) 36.0-36.9, adult: Secondary | ICD-10-CM | POA: Diagnosis not present

## 2019-01-29 DIAGNOSIS — G8929 Other chronic pain: Secondary | ICD-10-CM | POA: Diagnosis not present

## 2019-01-29 DIAGNOSIS — I639 Cerebral infarction, unspecified: Secondary | ICD-10-CM | POA: Diagnosis not present

## 2019-01-29 DIAGNOSIS — G459 Transient cerebral ischemic attack, unspecified: Secondary | ICD-10-CM | POA: Diagnosis not present

## 2019-01-29 NOTE — Assessment & Plan Note (Signed)
Wt Readings from Last 3 Encounters:  01/29/19 188 lb (85.3 kg)  11/05/18 188 lb (85.3 kg)  10/01/18 189 lb (85.7 kg)

## 2019-01-29 NOTE — Assessment & Plan Note (Signed)
No relapse 

## 2019-01-29 NOTE — Patient Instructions (Signed)
If you have medicare related insurance (such as traditional Medicare, Blue Cross Medicare, United Health[employer] Medicare, or similar), Please make an appointment at the scheduling desk with Jill, the Wellness Health Coach, for your Wellness visit in this office, which is a benefit with your insurance.  

## 2019-01-29 NOTE — Assessment & Plan Note (Signed)
Gabapentin is helping

## 2019-01-29 NOTE — Progress Notes (Signed)
Subjective:  Patient ID: Gina [name], female    DOB: 02/27/1931  Age: 85 y.o. MRN: [id number]  CC: No chief complaint on file.   HPI Gina [name] presents for LBP - better, dyslipidemia, gout f/u Gabapentin is helping  Outpatient Medications Prior to Visit  Medication Sig Dispense Refill  . acetaminophen (TYLENOL) 500 MG tablet Take 1,000 mg by mouth 3 (three) times daily as needed (for pain.).     [name] allopurinol (ZYLOPRIM) 100 MG tablet Take 1 tablet (100 mg total) by mouth daily. 90 tablet 3  . amLODipine (NORVASC) 5 MG tablet Take 1 tablet (5 mg total) by mouth daily. 90 tablet 3  . atorvastatin (LIPITOR) 40 MG tablet Take 1 tablet (40 mg total) by mouth every evening. 90 tablet 3  . Calcium Carbonate (CALCIUM 600 PO) Take 600 mg by mouth 2 (two) times daily.     . cetirizine (ZYRTEC) 10 MG tablet Take 10 mg by mouth 2 (two) times daily.     . clopidogrel (PLAVIX) 75 MG tablet Take 1 tablet (75 mg total) by mouth daily. 90 tablet 3  . enalapril (VASOTEC) 10 MG tablet Take 1 tablet (10 mg total) by mouth daily. 90 tablet 3  . gabapentin (NEURONTIN) 300 MG capsule Take 300 mg by mouth daily.    [name] gabapentin (NEURONTIN) 400 MG capsule Take 1 capsule by mouth 4 (four) times daily.    [name] ipratropium (ATROVENT) 0.06 % nasal spray Place 2 sprays into the nose 3 (three) times daily. 15 mL 3  . Multiple Vitamin (MULTIVITAMIN WITH MINERALS) TABS tablet Take 1 tablet by mouth daily.    . Multiple Vitamins-Minerals (PRESERVISION AREDS 2 PO) Take 1 tablet by mouth daily.    [name] saccharomyces boulardii (FLORASTOR) 250 MG capsule Take 250 mg by mouth daily.    [name] triamterene-hydrochlorothiazide (MAXZIDE-25) 37.5-25 MG tablet Take 1 tablet by mouth daily. 90 tablet 3  . vitamin C (ASCORBIC ACID) 500 MG tablet Take 500 mg by mouth every evening.     No facility-administered medications prior to visit.    ROS: Review of Systems  Constitutional: Positive for fatigue. Negative for activity  change, appetite change, chills and unexpected weight change.  HENT: Negative for congestion, mouth sores and sinus pressure.   Eyes: Positive for visual disturbance.  Respiratory: Negative for cough and chest tightness.   Gastrointestinal: Negative for abdominal pain and nausea.  Genitourinary: Negative for difficulty urinating, frequency and vaginal pain.  Musculoskeletal: Positive for arthralgias and back pain. Negative for gait problem.  Skin: Negative for pallor and rash.  Neurological: Negative for dizziness, tremors, weakness, numbness and headaches.  Psychiatric/Behavioral: Negative for confusion and sleep disturbance.    Objective:  BP 140/72 (BP Location: Left Arm, Patient Position: Sitting, Cuff Size: Normal)   Pulse 83   Temp 97.8 F (36.6 C) (Oral)   Ht 5' (1.524 m)   Wt 188 lb (85.3 kg)   SpO2 92%   BMI 36.72 kg/m   BP Readings from Last 3 Encounters:  01/29/19 140/72  11/05/18 (!) 150/72  10/01/18 138/60    Wt Readings from Last 3 Encounters:  01/29/19 188 lb (85.3 kg)  11/05/18 188 lb (85.3 kg)  10/01/18 189 lb (85.7 kg)    Physical Exam Constitutional:      General: She is not in acute distress.    Appearance: She is well-developed. She is obese.  HENT:     Head: Normocephalic.     Right Ear:  External ear normal.     Left Ear: External ear normal.     Nose: Nose normal.  Eyes:     General:        Right eye: No discharge.        Left eye: No discharge.     Conjunctiva/sclera: Conjunctivae normal.     Pupils: Pupils are equal, round, and reactive to light.  Neck:     Thyroid: No thyromegaly.     Vascular: No JVD.     Trachea: No tracheal deviation.  Cardiovascular:     Rate and Rhythm: Normal rate and regular rhythm.     Heart sounds: Normal heart sounds.  Pulmonary:     Effort: No respiratory distress.     Breath sounds: No stridor. No wheezing.  Abdominal:     General: Bowel sounds are normal. There is no distension.     Palpations:  Abdomen is soft. There is no mass.     Tenderness: There is no abdominal tenderness. There is no guarding or rebound.  Musculoskeletal:        General: Tenderness present.     Cervical back: Normal range of motion and neck supple.  Lymphadenopathy:     Cervical: No cervical adenopathy.  Skin:    Findings: No erythema or rash.  Neurological:     Cranial Nerves: No cranial nerve deficit.     Motor: No abnormal muscle tone.     Coordination: Coordination normal.     Deep Tendon Reflexes: Reflexes normal.  Psychiatric:        Behavior: Behavior normal.        Thought Content: Thought content normal.        Judgment: Judgment normal.     Lab Results  Component Value Date   WBC 5.1 09/17/2018   HGB 13.3 09/17/2018   HCT 40.1 09/17/2018   PLT 178.0 09/17/2018   GLUCOSE 93 09/17/2018   CHOL 128 02/23/2018   TRIG 37 02/23/2018   HDL 79 02/23/2018   LDLDIRECT 69.0 09/09/2014   LDLCALC 42 02/23/2018   ALT 18 02/22/2018   AST 21 02/22/2018   NA 138 09/17/2018   K 4.5 09/17/2018   CL 100 09/17/2018   CREATININE 0.90 09/17/2018   BUN 30 (H) 09/17/2018   CO2 28 09/17/2018   TSH 2.58 09/17/2018   INR 0.96 07/24/2017   HGBA1C 5.9 (H) 02/23/2018    CT ANGIO HEAD W OR WO CONTRAST  Result Date: 02/22/2018 CLINICAL DATA:  TIA. Transient agraphia, confusion, and right arm numbness today. EXAM: CT ANGIOGRAPHY HEAD AND NECK TECHNIQUE: Multidetector CT imaging of the head and neck was performed using the standard protocol during bolus administration of intravenous contrast. Multiplanar CT image reconstructions and MIPs were obtained to evaluate the vascular anatomy. Carotid stenosis measurements (when applicable) are obtained utilizing NASCET criteria, using the distal internal carotid diameter as the denominator. CONTRAST:  22mL ISOVUE-370 IOPAMIDOL (ISOVUE-370) INJECTION 76% COMPARISON:  Head CT 11/12/2009 and MRI/MRA 11/13/2009 FINDINGS: CT HEAD FINDINGS Brain: There is no evidence of acute  infarct, intracranial hemorrhage, mass, midline shift, or extra-axial fluid collection. Mild cerebral atrophy is within normal limits for age. Patchy to confluent cerebral white matter hypodensities are similar to the prior CT and nonspecific but compatible with moderately advanced chronic small vessel ischemic disease. Heterogeneous hypoattenuation in the basal ganglia bilaterally is also similar to the prior CT with numerous dilated perivascular spaces seen on MRI. A left parafalcine arachnoid cyst is incidentally noted in  the posterior frontal region, unchanged and of no current clinical significance. Vascular: Calcified atherosclerosis at the skull base. Skull: No fracture or focal osseous lesion. Sinuses: Mid left ethmoid air cell osteoma. Posterior left ethmoid air cell and left maxillary sinus mucous retention cysts. Clear mastoid air cells. Orbits: Bilateral cataract extraction. Review of the MIP images confirms the above findings CTA NECK FINDINGS Aortic arch: Standard 3 vessel aortic arch with extensive calcified plaque. Mild proximal left subclavian artery stenosis due to calcified plaque. Right carotid system: Patent with moderate calcified plaque about the carotid bifurcation. No evidence of significant stenosis or dissection Left carotid system: Patent with mild calcified plaque about the carotid bifurcation. No evidence of significant stenosis or dissection. Vertebral arteries: Patent and codominant without evidence of significant stenosis or dissection. Skeleton: Severe disc and moderate to severe facet degeneration at multiple levels in the cervical spine. Other neck: Thyroid asymmetry with the right lobe being larger than left and without a discrete lesion identified. Upper chest: Clear lung apices. Review of the MIP images confirms the above findings CTA HEAD FINDINGS Anterior circulation: The internal carotid arteries are patent from skull base to carotid termini with mild-to-moderate calcified  plaque bilaterally not resulting in significant stenosis. ACAs and MCAs are patent with moderate branch vessel irregularity but no evidence of proximal branch occlusion. There are mild-to-moderate distal left A1 and proximal left A2 stenoses. No aneurysm is identified. Posterior circulation: The intracranial vertebral arteries are widely patent to the basilar. Patent bilateral PICA, right AICA, and bilateral SCA origins are identified. The basilar artery is widely patent. Posterior communicating arteries are diminutive or absent. PCAs are patent with branch vessel irregularity but no significant proximal stenosis. No aneurysm is identified. Venous sinuses: Patent. Anatomic variants: None. Delayed phase: No abnormal enhancement. Review of the MIP images confirms the above findings IMPRESSION: 1. No evidence of acute intracranial abnormality. 2. Moderately advanced chronic small vessel ischemic disease. 3. No large vessel occlusion. 4. Right greater than left cervical carotid artery atherosclerosis without stenosis. 5. Intracranial atherosclerosis resulting in mild-to-moderate distal left A1 and proximal left A2 stenoses. Electronically Signed   By: Logan Bores M.D.   On: 02/22/2018 18:53   CT ANGIO NECK W OR WO CONTRAST  Result Date: 02/22/2018 CLINICAL DATA:  TIA. Transient agraphia, confusion, and right arm numbness today. EXAM: CT ANGIOGRAPHY HEAD AND NECK TECHNIQUE: Multidetector CT imaging of the head and neck was performed using the standard protocol during bolus administration of intravenous contrast. Multiplanar CT image reconstructions and MIPs were obtained to evaluate the vascular anatomy. Carotid stenosis measurements (when applicable) are obtained utilizing NASCET criteria, using the distal internal carotid diameter as the denominator. CONTRAST:  56mL ISOVUE-370 IOPAMIDOL (ISOVUE-370) INJECTION 76% COMPARISON:  Head CT 11/12/2009 and MRI/MRA 11/13/2009 FINDINGS: CT HEAD FINDINGS Brain: There is no  evidence of acute infarct, intracranial hemorrhage, mass, midline shift, or extra-axial fluid collection. Mild cerebral atrophy is within normal limits for age. Patchy to confluent cerebral white matter hypodensities are similar to the prior CT and nonspecific but compatible with moderately advanced chronic small vessel ischemic disease. Heterogeneous hypoattenuation in the basal ganglia bilaterally is also similar to the prior CT with numerous dilated perivascular spaces seen on MRI. A left parafalcine arachnoid cyst is incidentally noted in the posterior frontal region, unchanged and of no current clinical significance. Vascular: Calcified atherosclerosis at the skull base. Skull: No fracture or focal osseous lesion. Sinuses: Mid left ethmoid air cell osteoma. Posterior left ethmoid air cell and  left maxillary sinus mucous retention cysts. Clear mastoid air cells. Orbits: Bilateral cataract extraction. Review of the MIP images confirms the above findings CTA NECK FINDINGS Aortic arch: Standard 3 vessel aortic arch with extensive calcified plaque. Mild proximal left subclavian artery stenosis due to calcified plaque. Right carotid system: Patent with moderate calcified plaque about the carotid bifurcation. No evidence of significant stenosis or dissection Left carotid system: Patent with mild calcified plaque about the carotid bifurcation. No evidence of significant stenosis or dissection. Vertebral arteries: Patent and codominant without evidence of significant stenosis or dissection. Skeleton: Severe disc and moderate to severe facet degeneration at multiple levels in the cervical spine. Other neck: Thyroid asymmetry with the right lobe being larger than left and without a discrete lesion identified. Upper chest: Clear lung apices. Review of the MIP images confirms the above findings CTA HEAD FINDINGS Anterior circulation: The internal carotid arteries are patent from skull base to carotid termini with  mild-to-moderate calcified plaque bilaterally not resulting in significant stenosis. ACAs and MCAs are patent with moderate branch vessel irregularity but no evidence of proximal branch occlusion. There are mild-to-moderate distal left A1 and proximal left A2 stenoses. No aneurysm is identified. Posterior circulation: The intracranial vertebral arteries are widely patent to the basilar. Patent bilateral PICA, right AICA, and bilateral SCA origins are identified. The basilar artery is widely patent. Posterior communicating arteries are diminutive or absent. PCAs are patent with branch vessel irregularity but no significant proximal stenosis. No aneurysm is identified. Venous sinuses: Patent. Anatomic variants: None. Delayed phase: No abnormal enhancement. Review of the MIP images confirms the above findings IMPRESSION: 1. No evidence of acute intracranial abnormality. 2. Moderately advanced chronic small vessel ischemic disease. 3. No large vessel occlusion. 4. Right greater than left cervical carotid artery atherosclerosis without stenosis. 5. Intracranial atherosclerosis resulting in mild-to-moderate distal left A1 and proximal left A2 stenoses. Electronically Signed   By: Logan Bores M.D.   On: 02/22/2018 18:53   MR BRAIN WO CONTRAST  Result Date: 02/22/2018 CLINICAL DATA:  TIA. Transient agraphia, confusion, and right arm numbness today. EXAM: MRI HEAD WITHOUT CONTRAST TECHNIQUE: Multiplanar, multiecho pulse sequences of the brain and surrounding structures were obtained without intravenous contrast. COMPARISON:  Head CT 02/22/2018 and MRI 11/13/2009 FINDINGS: Brain: There is no evidence of acute infarct, intracranial hemorrhage, mass, midline shift, or extra-axial fluid collection. Mild cerebral atrophy is within normal limits for age. Patchy to confluent T2 hyperintensities in the cerebral white matter and pons are nonspecific but compatible with moderately advanced chronic small vessel ischemic disease,  mildly progressed from the 2011 MRI most notably in the pons. T2 hyperintensities throughout the basal ganglia bilaterally are unchanged from the prior MRI and appear to predominantly reflect dilated perivascular spaces. There is also mild-to-moderate chronic T2 heterogeneity in the thalami. A left parafalcine arachnoid cyst in the posterior frontal region is unchanged. Vascular: Major intracranial vascular flow voids are preserved. Skull and upper cervical spine: Unremarkable bone marrow signal. Sinuses/Orbits: Bilateral cataract extraction. Scattered small mucous retention cysts in the paranasal sinuses. Clear mastoid air cells. Other: None. IMPRESSION: 1. No acute intracranial abnormality. 2. Moderately advanced chronic small vessel ischemic disease. Electronically Signed   By: Logan Bores M.D.   On: 02/22/2018 19:39   EEG adult  Result Date: 02/23/2018 Alexis Goodell, MD     02/23/2018  6:34 PM ELECTROENCEPHALOGRAM REPORT Patient: JOLIE STROHECKER       Room #: 5C10C EEG No. ID: [id number] Age: 85 y.o.  Sex: female Referring Physician: Tamala Julian Report Date:  02/23/2018       Interpreting Physician: Alexis Goodell History: ASHAWNTI TANGEN is an 85 y.o. female with episodic confusion Medications: Zyloprim, Norvasc, Rocephin, Plavix, Vasotec, MVI, Maxzide, Vitamin C Conditions of Recording:  This is a 21 channel routine scalp EEG performed with bipolar and monopolar montages arranged in accordance to the international 10/20 system of electrode placement. One channel was dedicated to EKG recording. The patient is in the awake, drowsy and asleep states. Description:  The waking background activity consists of a low voltage, symmetrical, fairly well organized, 10 Hz alpha activity, seen from the parieto-occipital and posterior temporal regions.  Low voltage fast activity, poorly organized, is seen anteriorly and is at times superimposed on more posterior regions.  A mixture of theta and alpha rhythms are  seen from the central and temporal regions. The patient drowses with slowing to irregular, low voltage theta and beta activity.  The patient goes in to a light sleep with symmetrical sleep spindles, vertex central sharp transients and irregular slow activity.  No epileptiform activity is noted. Hyperventilation and intermittent photic stimulation were not performed. IMPRESSION: Normal electroencephalogram, awake and asleep. There are no focal lateralizing or epileptiform features. Alexis Goodell, MD Neurology [phone] 02/23/2018, 6:30 PM   ECHOCARDIOGRAM COMPLETE  Result Date: 02/23/2018                            *[facility]*                         [address] [place], [place] [address]                            [phone] ------------------------------------------------------------------- Transthoracic Echocardiography Patient:    Mabel, Roll MR #:       [id number] Study Date: 02/23/2018 Gender:     F Age:        38 Height:     152.4 cm Weight:     81.6 kg BSA:        1.9 m^2 Pt. Status: Room:       [place], [facility]  PERFORMING   [facility], Inpatient  ATTENDING    [place], [place], [place]  ADMITTING    [place], [place], [place] M cc: ------------------------------------------------------------------- LV EF: 55% -   60% ------------------------------------------------------------------- Indications:      TIA 435.9. ------------------------------------------------------------------- History:   PMH:  PAD.  Stroke.  Risk factors:  Hypertension. Dyslipidemia. ------------------------------------------------------------------- Study Conclusions - Left ventricle: The cavity size was normal. Wall thickness was   normal. Systolic function was normal. The estimated ejection   fraction was in the range of 55% to 60%. Wall motion was  normal;   there were no regional wall motion abnormalities. Doppler   parameters are consistent with abnormal left ventricular   relaxation (grade 1 diastolic dysfunction). - Aortic valve: Noncoronary cusp mobility was moderately   restricted. Sclerosis without stenosis. - Mitral valve:  Calcified annulus. - Left atrium: The atrium was mildly dilated. - Pericardium, extracardiac: A trivial pericardial effusion was   identified. ------------------------------------------------------------------- Study data:  No prior study was available for comparison.  Study status:  Routine.  Procedure:  The patient reported no pain pre or post test. Transthoracic echocardiography. Image quality was adequate.  Study completion:  There were no complications. Transthoracic echocardiography.  M-mode, complete 2D, spectral Doppler, and color Doppler.  Birthdate:  Patient birthdate: 26-Oct-1931.  Age:  Patient is 85 yr old.  Sex:  Gender: female. BMI: 35.2 kg/m^2.  Blood pressure:     146/54  Patient status: Inpatient.  Study date:  Study date: 02/23/2018. Study time: 09:38 AM.  Location:  Echo laboratory. ------------------------------------------------------------------- ------------------------------------------------------------------- Left ventricle:  The cavity size was normal. Wall thickness was normal. Systolic function was normal. The estimated ejection fraction was in the range of 55% to 60%. Wall motion was normal; there were no regional wall motion abnormalities. Doppler parameters are consistent with abnormal left ventricular relaxation (grade 1 diastolic dysfunction). ------------------------------------------------------------------- Aortic valve:   Mildly thickened, mildly calcified leaflets. Noncoronary cusp mobility was moderately restricted. Sclerosis without stenosis.  Doppler:     VTI ratio of LVOT to aortic valve: 0.76. Valve area (VTI): 2.39 cm^2. Indexed valve area (VTI): 1.26 cm^2/m^2. Peak velocity ratio of LVOT  to aortic valve: 0.71. Valve area (Vmax): 2.23 cm^2. Indexed valve area (Vmax): 1.17 cm^2/m^2. Mean velocity ratio of LVOT to aortic valve: 0.71. Valve area (Vmean): 2.21 cm^2. Indexed valve area (Vmean): 1.17 cm^2/m^2. Mean gradient (S): 9 mm Hg. Peak gradient (S): 15 mm Hg. ------------------------------------------------------------------- Aorta:  Aortic root: The aortic root was normal in size. Ascending aorta: The ascending aorta was normal in size. ------------------------------------------------------------------- Mitral valve:   Calcified annulus. Leaflet separation was normal. Doppler:  Transvalvular velocity was within the normal range. There was no evidence for stenosis. There was no regurgitation.    Valve area by pressure half-time: 4.31 cm^2. Indexed valve area by pressure half-time: 2.27 cm^2/m^2.    Peak gradient (D): 3 mm Hg.  ------------------------------------------------------------------- Left atrium:  The atrium was mildly dilated. ------------------------------------------------------------------- Right ventricle:  The cavity size was normal. Systolic function was normal. ------------------------------------------------------------------- Pulmonic valve:   Poorly visualized.  The valve appears to be grossly normal.    Doppler:  There was no significant regurgitation. ------------------------------------------------------------------- Tricuspid valve:   Structurally normal valve.   Leaflet separation was normal.  Doppler:  Transvalvular velocity was within the normal range. There was no regurgitation. ------------------------------------------------------------------- Pulmonary artery:    Systolic pressure could not be accurately estimated. ------------------------------------------------------------------- Right atrium:  The atrium was normal in size. ------------------------------------------------------------------- Pericardium:  A trivial pericardial effusion was identified.  ------------------------------------------------------------------- Measurements  Left ventricle                          Value           Reference  LV ID, ED, PLAX chordal                 43     mm       43 - 52  LV ID, ES, PLAX chordal                 31     mm       23 - 38  LV fx shortening, PLAX chordal  (L)     28     %        >=  29  LV PW thickness, ED                     9      mm       ----------  IVS/LV PW ratio, ED                     1               <=1.3  Stroke volume, 2D                       101    ml       ----------  Stroke volume/bsa, 2D                   53     ml/m^2   ----------  LV e&', lateral                          8.92   cm/s     ----------  LV E/e&', lateral                        10.29           ----------  LV e&', medial                           7.18   cm/s     ----------  LV E/e&', medial                         12.79           ----------  LV e&', average                          8.05   cm/s     ----------  LV E/e&', average                        11.4            ----------  LV ejection time                        300    ms       ----------   Ventricular septum                      Value           Reference  IVS thickness, ED                       9      mm       ----------   LVOT                                    Value           Reference  LVOT ID, S                              20     mm       ----------  LVOT area  3.14   cm^2     ----------  LVOT peak velocity, S                   137    cm/s     ----------  LVOT mean velocity, S                   99.5   cm/s     ----------  LVOT VTI, S                             32.2   cm       ----------  LVOT peak gradient, S                   8      mm Hg    ----------   Aortic valve                            Value           Reference  Aortic valve peak velocity, S           194.05 cm/s     ----------  Aortic valve mean velocity, S           141.08 cm/s     ----------  Aortic valve VTI, S                      42.22  cm       ----------  Aortic mean gradient, S                 9      mm Hg    ----------  Aortic peak gradient, S                 15     mm Hg    ----------  VTI ratio, LVOT/AV                      0.76            ----------  Aortic valve area, VTI                  2.39   cm^2     ----------  Aortic valve area/bsa, VTI              1.26   cm^2/m^2 ----------  Velocity ratio, peak, LVOT/AV           0.71            ----------  Aortic valve area, peak                 2.23   cm^2     ----------  velocity  Aortic valve area/bsa, peak             1.17   cm^2/m^2 ----------  velocity  Velocity ratio, mean, LVOT/AV           0.71            ----------  Aortic valve area, mean                 2.21   cm^2     ----------  velocity  Aortic valve area/bsa, mean             1.17   cm^2/m^2 ----------  velocity   Aorta                                   Value           Reference  Aortic root ID, ED                      30     mm       ----------   Left atrium                             Value           Reference  LA ID, A-P, ES                          40     mm       ----------  LA ID/bsa, A-P                          2.11   cm/m^2   <=2.2  LA volume, S                            51.7   ml       ----------  LA volume/bsa, S                        27.2   ml/m^2   ----------  LA volume, ES, 1-p A4C                  45.3   ml       ----------  LA volume/bsa, ES, 1-p A4C              23.9   ml/m^2   ----------  LA volume, ES, 1-p A2C                  58.2   ml       ----------  LA volume/bsa, ES, 1-p A2C              30.6   ml/m^2   ----------   Mitral valve                            Value           Reference  Mitral E-wave peak velocity             91.8   cm/s     ----------  Mitral A-wave peak velocity             131    cm/s     ----------  Mitral deceleration time                173    ms       150 - 230  Mitral pressure half-time               51     ms       ----------  Mitral peak gradient, D                 3      mm  Hg    ----------  Mitral E/A ratio, peak                  0.7             ----------  Mitral valve area, PHT, DP              4.31   cm^2     ----------  Mitral valve area/bsa, PHT, DP          2.27   cm^2/m^2 ----------   Right atrium                            Value           Reference  RA ID, S-I, ES, A4C                     47.7   mm       34 - 49  RA area, ES, A4C                        16     cm^2     8.3 - 19.5  RA volume, ES, A/L                      43.5   ml       ----------  RA volume/bsa, ES, A/L                  22.9   ml/m^2   ----------   Right ventricle                         Value           Reference  RV ID, minor axis, ED, A4C base         33     mm       ----------  TAPSE                                   28.2   mm       ----------  RV s&', lateral, S                       17.3   cm/s     ---------- Legend: (L)  and  (H)  mark values outside specified reference range. ------------------------------------------------------------------- Prepared and Electronically Authenticated by Sanda Klein, MD 2020-01-10T13:24:15   Assessment & Plan:   There are no diagnoses linked to this encounter.   No orders of the defined types were placed in this encounter.    Follow-up: No follow-ups on file.  Walker Kehr, MD

## 2019-03-26 ENCOUNTER — Telehealth: Payer: Self-pay | Admitting: Internal Medicine

## 2019-03-26 NOTE — Telephone Encounter (Signed)
Patient states she is scheduled for a COVID Vac.  States she was told she needed a letter from Dr. Camila Li stating it was ok for her to get the COVID vac being on blood thinners.  Please follow up in regard if this is something Dr. Camila Li is doing.

## 2019-03-26 NOTE — Telephone Encounter (Signed)
Okay.  Thanks.

## 2019-03-26 NOTE — Telephone Encounter (Signed)
Okay to write letter 

## 2019-03-27 NOTE — Telephone Encounter (Signed)
Pt notified letter ready to be picked up

## 2019-04-16 DIAGNOSIS — M47816 Spondylosis without myelopathy or radiculopathy, lumbar region: Secondary | ICD-10-CM | POA: Diagnosis not present

## 2019-04-16 DIAGNOSIS — M5136 Other intervertebral disc degeneration, lumbar region: Secondary | ICD-10-CM | POA: Diagnosis not present

## 2019-04-16 DIAGNOSIS — M4316 Spondylolisthesis, lumbar region: Secondary | ICD-10-CM | POA: Diagnosis not present

## 2019-04-16 DIAGNOSIS — M5416 Radiculopathy, lumbar region: Secondary | ICD-10-CM | POA: Diagnosis not present

## 2019-04-16 DIAGNOSIS — M48062 Spinal stenosis, lumbar region with neurogenic claudication: Secondary | ICD-10-CM | POA: Diagnosis not present

## 2019-04-26 ENCOUNTER — Telehealth: Payer: Self-pay | Admitting: Gastroenterology

## 2019-04-26 ENCOUNTER — Telehealth: Payer: Self-pay | Admitting: Internal Medicine

## 2019-04-26 NOTE — Telephone Encounter (Signed)
     Patient calling to report episode of "bloody diarrhea" during the night. Slightly improved this morning. No other symptoms Patient advised Dr Alain Marion not in office today. Patient states she will call Dr Fuller Plan at [facility] for possible appt. She is still requesting a call back with advice. Please call

## 2019-04-26 NOTE — Telephone Encounter (Signed)
Pt notified to call GI with her history of colon cancer

## 2019-04-26 NOTE — Telephone Encounter (Signed)
The pt noticed BRBPR about 3 weeks ago with watery diarrhea.  Has noticed a few episodes since of BRBPR with no other symptoms.  She has seen her PCP and was told to call our office.  Per pt she saw an oncologist in 2012 for colon cancer.  She has been scheduled for an office visit with Nevin Bloodgood in 3/24.

## 2019-05-08 ENCOUNTER — Ambulatory Visit: Payer: Medicare Other | Admitting: Physician Assistant

## 2019-05-08 ENCOUNTER — Encounter: Payer: Self-pay | Admitting: Physician Assistant

## 2019-05-08 ENCOUNTER — Other Ambulatory Visit (INDEPENDENT_AMBULATORY_CARE_PROVIDER_SITE_OTHER): Payer: Medicare Other

## 2019-05-08 ENCOUNTER — Ambulatory Visit (INDEPENDENT_AMBULATORY_CARE_PROVIDER_SITE_OTHER): Payer: Medicare Other | Admitting: Physician Assistant

## 2019-05-08 VITALS — BP 146/60 | HR 88 | Temp 99.0°F | Ht 60.0 in | Wt 187.5 lb

## 2019-05-08 DIAGNOSIS — K625 Hemorrhage of anus and rectum: Secondary | ICD-10-CM | POA: Diagnosis not present

## 2019-05-08 DIAGNOSIS — R140 Abdominal distension (gaseous): Secondary | ICD-10-CM | POA: Diagnosis not present

## 2019-05-08 DIAGNOSIS — R1084 Generalized abdominal pain: Secondary | ICD-10-CM

## 2019-05-08 DIAGNOSIS — Z85038 Personal history of other malignant neoplasm of large intestine: Secondary | ICD-10-CM

## 2019-05-08 LAB — BASIC METABOLIC PANEL
BUN: 27 mg/dL — ABNORMAL HIGH (ref 6–23)
CO2: 31 mEq/L (ref 19–32)
Calcium: 10.1 mg/dL (ref 8.4–10.5)
Chloride: 99 mEq/L (ref 96–112)
Creatinine, Ser: 1 mg/dL (ref 0.40–1.20)
GFR: 52.59 mL/min — ABNORMAL LOW (ref >60.00)
Glucose, Bld: 96 mg/dL (ref 70–99)
Potassium: 4.2 mEq/L (ref 3.5–5.1)
Sodium: 138 mEq/L (ref 135–145)

## 2019-05-08 NOTE — Progress Notes (Signed)
Subjective:    Patient ID: Gina [name], female    DOB: 21-Feb-1931, 86 y.o.   MRN: [id number]  HPI Gina [name] is a pleasant 86 year old white female, known to Gina [name], who comes in today after a recent isolated episode of hematochezia. Patient has history of hypertension, peripheral arterial disease, history of TIAs and is maintained on Plavix.  She has history of colon cancer, diagnosed 2012 status post resection and adjuvant therapy.  Also with known diverticulosis. Last colonoscopy was done in 2018 with finding of a patent anastomosis at the hepatic flexure.  She had one 4 mm polyp in the sigmoid colon removed and that was a tubular adenoma, multiple left colon diverticuli and medium sized internal hemorrhoids. Patient says she had an episode a couple of weeks ago with nighttime symptoms of significant "rolling" rather diffusely in her abdomen with a lot of gurgling and noise eventually followed by urge for bowel movement.  She had 2 fairly forceful loose bowel movements early in the morning hours, And after the Second Episode Noticed Bright Red Blood on the Tissue.  She did not look in the commode with either episode as it was the middle of the night and dark. She has not had any recurrence since and has been monitoring her stools.  Bowel movements have been formed and she usually has up to 3-4 bowel movements per day.  She has not had any more episodes of "rolling" though she does feel bloated and gassy.  She denies any real abdominal pain.  Appetite has been fine, weight has been stable. She admits to being quite anxious over the recent symptoms and worrying about cancer.  Review of Systems Pertinent positive and negative review of systems were noted in the above HPI section.  All other review of systems was otherwise negative.  Outpatient Encounter Medications as of 05/08/2019  Medication Sig  . acetaminophen (TYLENOL) 500 MG tablet Take 1,000 mg by mouth 3 (three) times daily as needed  (for pain.).   [name] allopurinol (ZYLOPRIM) 100 MG tablet Take 1 tablet (100 mg total) by mouth daily.  [name] amLODipine (NORVASC) 5 MG tablet Take 1 tablet (5 mg total) by mouth daily.  [name] atorvastatin (LIPITOR) 40 MG tablet Take 1 tablet (40 mg total) by mouth every evening.  . Calcium Carbonate (CALCIUM 600 PO) Take 600 mg by mouth 2 (two) times daily.   . cetirizine (ZYRTEC) 10 MG tablet Take 10 mg by mouth 2 (two) times daily.   . clopidogrel (PLAVIX) 75 MG tablet Take 1 tablet (75 mg total) by mouth daily.  . enalapril (VASOTEC) 10 MG tablet Take 1 tablet (10 mg total) by mouth daily.  [name] gabapentin (NEURONTIN) 400 MG capsule Take 1 capsule by mouth 4 (four) times daily.  [name] ipratropium (ATROVENT) 0.06 % nasal spray Place 2 sprays into the nose 3 (three) times daily.  . Multiple Vitamin (MULTIVITAMIN WITH MINERALS) TABS tablet Take 1 tablet by mouth daily.  . Multiple Vitamins-Minerals (PRESERVISION AREDS 2 PO) Take 1 tablet by mouth daily.  [name] saccharomyces boulardii (FLORASTOR) 250 MG capsule Take 250 mg by mouth daily.  [name] triamterene-hydrochlorothiazide (MAXZIDE-25) 37.5-25 MG tablet Take 1 tablet by mouth daily.  . vitamin C (ASCORBIC ACID) 500 MG tablet Take 500 mg by mouth every evening.  . [DISCONTINUED] gabapentin (NEURONTIN) 300 MG capsule Take 300 mg by mouth daily.   No facility-administered encounter medications on file as of 05/08/2019.   Allergies  Allergen Reactions  . Penicillins Hives  Has patient had a PCN reaction causing immediate rash, facial/tongue/throat swelling, SOB or lightheadedness with hypotension:## yes ## Has patient had a PCN reaction causing severe rash involving mucus membranes or skin necrosis: no Has patient had a PCN reaction that required hospitalization: no Has patient had a PCN reaction occurring within the last 10 years: no If all of the above answers are "NO", then may proceed with Cephalosporin use.   . Aspirin Hives  . Adhesive [Tape] Other (See  Comments)    "pulls skin off"   Patient Active Problem List   Diagnosis Date Noted  . Obesity 10/01/2018  . Macrocytic anemia 02/25/2018  . TIA (transient ischemic attack) 02/22/2018  . Acute lower UTI 02/22/2018  . Bruising 11/20/2017  . Ataxia 11/20/2017  . Diarrhea 09/12/2017  . Acute chest wall pain 06/14/2017  . Acute bronchitis 05/25/2017  . Abdominal distention 10/04/2016  . Elevated glucose 02/18/2016  . PAD (peripheral artery disease) ([place]) 03/17/2015  . Dysuria 05/17/2014  . Travel advice encounter 03/18/2014  . Osteopenia 03/18/2014  . Well adult exam 09/07/2013  . Routine health maintenance 09/04/2011  . Left otitis media 03/31/2011  . Vertigo 03/31/2011  . Cancer of right colon ([place]) 01/17/2011  . Low back pain 10/25/2010  . Unspecified transient cerebral ischemia 11/17/2009  . HIP PAIN, LEFT 07/22/2009  . Diverticulosis 04/24/2008  . COLONIC POLYPS, HYPERPLASTIC, HX OF 04/24/2008  . Dyslipidemia 09/08/2006  . Gout 09/08/2006  . Essential hypertension 09/08/2006  . Allergic rhinitis 09/08/2006   Social History   Socioeconomic History  . Marital status: Widowed    Spouse name: Not on file  . Number of children: 3  . Years of education: 61  . Highest education level: Not on file  Occupational History  . Occupation: Tree surgeon: RETIRED    Comment: retired  Tobacco Use  . Smoking status: Former Smoker    Packs/day: 1.50    Years: 30.00    Pack years: 45.00    Types: Cigarettes    Quit date: 07/21/1987    Years since quitting: 31.8  . Smokeless tobacco: Never Used  Substance and Sexual Activity  . Alcohol use: Yes    Alcohol/week: 14.0 standard drinks    Types: 14 Glasses of wine per week    Comment: Wine daily  . Drug use: No  . Sexual activity: Not Currently  Other Topics Concern  . Not on file  Social History Narrative   HSG, Married 1955 widowed 2010. 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 Passed away 07/15/08 Her son  moved to [place] (July '10). End of life care:  no extra ordinary measures: wants cardiac resusitation and mechanical ventilation short-term if needed. Does not want to be kept in a persistant vegative state or long term artifical life support.    Social Determinants of Health   Financial Resource Strain:   . Difficulty of Paying Living Expenses:   Food Insecurity:   . Worried About Charity fundraiser in the Last Year:   . Arboriculturist in the Last Year:   Transportation Needs:   . Film/video editor (Medical):   [name] Lack of Transportation (Non-Medical):   Physical Activity:   . Days of Exercise per Week:   . Minutes of Exercise per Session:   Stress:   .  Feeling of Stress :   Social Connections:   . Frequency of Communication with Friends and Family:   . Frequency of Social Gatherings with Friends and Family:   . Attends Religious Services:   . Active Member of Clubs or Organizations:   . Attends Archivist Meetings:   [name] Marital Status:   Intimate Partner Violence:   . Fear of Current or Ex-Partner:   . Emotionally Abused:   [name] Physically Abused:   . Sexually Abused:     Ms. Dlouhy family history includes Asthma in her mother; Breast cancer in her mother; Colon cancer in her father; Coronary artery disease in her father and mother; Heart failure in her mother; Hyperlipidemia in her father and mother; Hypertension in her father and mother; Ulcers in her father.      Objective:    Vitals:   05/08/19 1358  BP: (!) 146/60  Pulse: 88  Temp: 99 F (37.2 C)    Physical Exam Well-developed well-nourished elderly white female in no acute distress.  Height, Weight, 187 BMI 36.6  HEENT; nontraumatic normocephalic, EOMI, PER R LA, sclera anicteric. Oropharynx; not examined Neck; supple, no  JVD Cardiovascular; regular rate and rhythm with S1-S2, no murmur rub or gallop Pulmonary; Clear bilaterally Abdomen; soft, , nondistended, obese, no palpable mass or hepatosplenomegaly, bowel sounds are active, no focal tenderness, somewhat full feeling Rectal; not done today Skin; benign exam, no jaundice rash or appreciable lesions Extremities; no clubbing cyanosis or edema skin warm and dry Neuro/Psych; alert and oriented x4, grossly nonfocal mood and affect appropriate       Assessment & [name]:   #73 86 year old white female with history of colon cancer 2012, status post resection and adjuvant therapy.  She has had serial colonoscopies, last exam 2018 with one 4 mm tubular adenoma removed.  Also with documented diverticulosis and internal hemorrhoids. Patient with recent isolated episode of hematochezia which was associated with an episode of nocturnal abdominal discomfort, significant borborygmi, followed by loose stool x2. No recurrence of bleeding since, no recurrence of diarrhea but has had persistent bloated gassy sensation.  Etiology of bleeding likely secondary to internal hemorrhoid. Recent abdominal gassiness bloating, and transient diarrhea/borborygmi.  Rule out possible transient low-grade obstruction, rule out viral.  Doubt malignancy but cannot rule out  #2 chronic antiplatelet therapy 3.  History of peripheral arterial disease and TIAs 4.  Hypertension  [name]; BMET today, patient scheduled for annual physical with Dr. Alain Marion in 2 weeks and will have full labs done at that time Schedule for CT scan of the abdomen and pelvis with contrast. Further recommendations pending findings of above.  Schae Cando S Rolando Hessling PA-C 05/08/2019   Cc: Plotnikov, Evie Lacks, MD

## 2019-05-08 NOTE — Patient Instructions (Signed)
  Your provider has requested that you go to the basement level for lab work before leaving today. Press "B" on the elevator. The lab is located at the first door on the left as you exit the elevator.   You have been scheduled for a CT scan of the abdomen and pelvis at [facility] ([address]---this is in the same building as [employer]).   You are scheduled on 05/17/2019 at 2:30pm. You should arrive 15 minutes prior to your appointment time for registration. Please follow the written instructions below on the day of your exam:  WARNING: IF YOU ARE ALLERGIC TO IODINE/X-RAY DYE, PLEASE NOTIFY RADIOLOGY IMMEDIATELY AT [phone]! YOU WILL BE GIVEN A 13 HOUR PREMEDICATION PREP.  1) Do not eat or drink anything after 10:30am (4 hours prior to your test) 2) You have been given 2 bottles of oral contrast to drink. The solution may taste better if refrigerated, but do NOT add ice or any other liquid to this solution. Shake well before drinking.    Drink 1 bottle of contrast @ 12:30pm (2 hours prior to your exam)  Drink 1 bottle of contrast @ 1:30pm (1 hour prior to your exam)  You may take any medications as prescribed with a small amount of water, if necessary. If you take any of the following medications: METFORMIN, GLUCOPHAGE, GLUCOVANCE, AVANDAMET, RIOMET, FORTAMET, [place] MET, JANUMET, GLUMETZA or METAGLIP, you MAY be asked to HOLD this medication 48 hours AFTER the exam.  The purpose of you drinking the oral contrast is to aid in the visualization of your intestinal tract. The contrast solution may cause some diarrhea. Depending on your individual set of symptoms, you may also receive an intravenous injection of x-ray contrast/dye. Plan on being at [facility] for 30 minutes or longer, depending on the type of exam you are having performed.  This test typically takes 30-45 minutes to complete.  If you have any questions regarding your exam or if you need to  reschedule, you may call the CT department at [phone] between the hours of 8:00 am and 5:00 pm, Monday-Friday.  ________________________________________________________________________

## 2019-05-09 NOTE — Progress Notes (Signed)
Reviewed and agree with management plan.  Niaja Stickley T. Katelan Hirt, MD FACG [facility]  

## 2019-05-17 ENCOUNTER — Other Ambulatory Visit: Payer: Self-pay

## 2019-05-17 ENCOUNTER — Ambulatory Visit (HOSPITAL_COMMUNITY)
Admission: RE | Admit: 2019-05-17 | Discharge: 2019-05-17 | Disposition: A | Discharge status: Home / Self Care | Payer: Medicare Other | Source: Ambulatory Visit | Attending: Internal Medicine | Admitting: Internal Medicine

## 2019-05-17 DIAGNOSIS — R1084 Generalized abdominal pain: Secondary | ICD-10-CM | POA: Diagnosis not present

## 2019-05-17 DIAGNOSIS — N289 Disorder of kidney and ureter, unspecified: Secondary | ICD-10-CM | POA: Diagnosis not present

## 2019-05-17 DIAGNOSIS — K5730 Diverticulosis of large intestine without perforation or abscess without bleeding: Secondary | ICD-10-CM | POA: Diagnosis not present

## 2019-05-17 MED ORDER — IOHEXOL 300 MG/ML  SOLN
100.0000 mL | Freq: Once | INTRAMUSCULAR | Status: AC | PRN
  Administered 2019-05-17: 15:00:00 100 mL via INTRAVENOUS

## 2019-05-22 ENCOUNTER — Other Ambulatory Visit (INDEPENDENT_AMBULATORY_CARE_PROVIDER_SITE_OTHER): Payer: Medicare Other

## 2019-05-22 DIAGNOSIS — G459 Transient cerebral ischemic attack, unspecified: Secondary | ICD-10-CM

## 2019-05-22 DIAGNOSIS — G8929 Other chronic pain: Secondary | ICD-10-CM | POA: Diagnosis not present

## 2019-05-22 DIAGNOSIS — E785 Hyperlipidemia, unspecified: Secondary | ICD-10-CM | POA: Diagnosis not present

## 2019-05-22 DIAGNOSIS — M5440 Lumbago with sciatica, unspecified side: Secondary | ICD-10-CM

## 2019-05-22 DIAGNOSIS — R7309 Other abnormal glucose: Secondary | ICD-10-CM | POA: Diagnosis not present

## 2019-05-22 DIAGNOSIS — Z6836 Body mass index (BMI) 36.0-36.9, adult: Secondary | ICD-10-CM | POA: Diagnosis not present

## 2019-05-22 LAB — CBC WITH DIFFERENTIAL/PLATELET
Basophils Absolute: 0 10*3/uL (ref 0.0–0.1)
Basophils Relative: 0.6 % (ref 0.0–3.0)
Eosinophils Absolute: 0.2 10*3/uL (ref 0.0–0.7)
Eosinophils Relative: 4.9 % (ref 0.0–5.0)
HCT: 39.4 % (ref 36.0–46.0)
Hemoglobin: 13.3 g/dL (ref 12.0–15.0)
Lymphocytes Relative: 31.6 % (ref 12.0–46.0)
Lymphs Abs: 1.6 10*3/uL (ref 0.7–4.0)
MCHC: 33.7 g/dL (ref 30.0–36.0)
MCV: 102 fl — ABNORMAL HIGH (ref 78.0–100.0)
Monocytes Absolute: 0.6 10*3/uL (ref 0.1–1.0)
Monocytes Relative: 11.2 % (ref 3.0–12.0)
Neutro Abs: 2.6 10*3/uL (ref 1.4–7.7)
Neutrophils Relative %: 51.7 % (ref 43.0–77.0)
Platelets: 162 10*3/uL (ref 150.0–400.0)
RBC: 3.86 Mil/uL — ABNORMAL LOW (ref 3.87–5.11)
RDW: 13.9 % (ref 11.5–15.5)
WBC: 5 10*3/uL (ref 4.0–10.5)

## 2019-05-22 LAB — HEPATIC FUNCTION PANEL
ALT: 13 U/L (ref 0–35)
AST: 21 U/L (ref 0–37)
Albumin: 4.1 g/dL (ref 3.5–5.2)
Alkaline Phosphatase: 65 U/L (ref 39–117)
Bilirubin, Direct: 0.1 mg/dL (ref 0.0–0.3)
Total Bilirubin: 0.6 mg/dL (ref 0.2–1.2)
Total Protein: 6.9 g/dL (ref 6.0–8.3)

## 2019-05-22 LAB — BASIC METABOLIC PANEL
BUN: 26 mg/dL — ABNORMAL HIGH (ref 6–23)
CO2: 29 mEq/L (ref 19–32)
Calcium: 9.5 mg/dL (ref 8.4–10.5)
Chloride: 102 mEq/L (ref 96–112)
Creatinine, Ser: 0.9 mg/dL (ref 0.40–1.20)
GFR: 59.38 mL/min — ABNORMAL LOW (ref >60.00)
Glucose, Bld: 93 mg/dL (ref 70–99)
Potassium: 4.2 mEq/L (ref 3.5–5.1)
Sodium: 139 mEq/L (ref 135–145)

## 2019-05-22 LAB — URINALYSIS, ROUTINE W REFLEX MICROSCOPIC
Bilirubin Urine: NEGATIVE
Hgb urine dipstick: NEGATIVE
Ketones, ur: NEGATIVE
Nitrite: NEGATIVE
RBC / HPF: NONE SEEN (ref 0–?)
Specific Gravity, Urine: 1.015 (ref 1.000–1.030)
Total Protein, Urine: NEGATIVE
Urine Glucose: NEGATIVE
Urobilinogen, UA: 0.2 (ref 0.0–1.0)
pH: 7 (ref 5.0–8.0)

## 2019-05-22 LAB — LIPID PANEL
Cholesterol: 186 mg/dL (ref 0–200)
HDL: 57.8 mg/dL (ref >39.00)
NonHDL: 128.24
Total CHOL/HDL Ratio: 3
Triglycerides: 268 mg/dL — ABNORMAL HIGH (ref 0.0–149.0)
VLDL: 53.6 mg/dL — ABNORMAL HIGH (ref 0.0–40.0)

## 2019-05-22 LAB — TSH: TSH: 2.85 u[IU]/mL (ref 0.35–4.50)

## 2019-05-22 LAB — HEMOGLOBIN A1C: Hgb A1c MFr Bld: 5.6 % (ref 4.6–6.5)

## 2019-05-22 LAB — LDL CHOLESTEROL, DIRECT: Direct LDL: 80 mg/dL

## 2019-05-30 ENCOUNTER — Ambulatory Visit: Payer: Medicare Other | Admitting: Internal Medicine

## 2019-06-03 ENCOUNTER — Encounter: Payer: Self-pay | Admitting: Internal Medicine

## 2019-06-03 ENCOUNTER — Other Ambulatory Visit: Payer: Self-pay

## 2019-06-03 ENCOUNTER — Ambulatory Visit (INDEPENDENT_AMBULATORY_CARE_PROVIDER_SITE_OTHER): Payer: Medicare Other | Admitting: Internal Medicine

## 2019-06-03 ENCOUNTER — Ambulatory Visit (INDEPENDENT_AMBULATORY_CARE_PROVIDER_SITE_OTHER): Payer: Medicare Other

## 2019-06-03 DIAGNOSIS — S3993XA Unspecified injury of pelvis, initial encounter: Secondary | ICD-10-CM | POA: Diagnosis not present

## 2019-06-03 DIAGNOSIS — C189 Malignant neoplasm of colon, unspecified: Secondary | ICD-10-CM | POA: Diagnosis not present

## 2019-06-03 DIAGNOSIS — G8929 Other chronic pain: Secondary | ICD-10-CM | POA: Diagnosis not present

## 2019-06-03 DIAGNOSIS — I10 Essential (primary) hypertension: Secondary | ICD-10-CM

## 2019-06-03 DIAGNOSIS — M5440 Lumbago with sciatica, unspecified side: Secondary | ICD-10-CM | POA: Diagnosis not present

## 2019-06-03 DIAGNOSIS — W1800XA Striking against unspecified object with subsequent fall, initial encounter: Secondary | ICD-10-CM | POA: Insufficient documentation

## 2019-06-03 DIAGNOSIS — M533 Sacrococcygeal disorders, not elsewhere classified: Secondary | ICD-10-CM | POA: Diagnosis not present

## 2019-06-03 DIAGNOSIS — R270 Ataxia, unspecified: Secondary | ICD-10-CM

## 2019-06-03 DIAGNOSIS — G459 Transient cerebral ischemic attack, unspecified: Secondary | ICD-10-CM | POA: Diagnosis not present

## 2019-06-03 DIAGNOSIS — M546 Pain in thoracic spine: Secondary | ICD-10-CM | POA: Diagnosis not present

## 2019-06-03 MED ORDER — ATORVASTATIN CALCIUM 40 MG PO TABS: 40.0000 mg | ORAL_TABLET | Freq: Every evening | ORAL | 3 refills | Status: DC

## 2019-06-03 MED ORDER — ENALAPRIL MALEATE 10 MG PO TABS: 10.0000 mg | ORAL_TABLET | Freq: Every day | ORAL | 3 refills | Status: DC

## 2019-06-03 MED ORDER — CLOPIDOGREL BISULFATE 75 MG PO TABS: 75.0000 mg | ORAL_TABLET | Freq: Every day | ORAL | 3 refills | Status: DC

## 2019-06-03 MED ORDER — GABAPENTIN 400 MG PO CAPS: 400.0000 mg | ORAL_CAPSULE | Freq: Four times a day (QID) | ORAL | 1 refills | Status: DC

## 2019-06-03 MED ORDER — ALLOPURINOL 100 MG PO TABS: 100.0000 mg | ORAL_TABLET | Freq: Every day | ORAL | 3 refills | Status: DC

## 2019-06-03 MED ORDER — TRIAMTERENE-HCTZ 37.5-25 MG PO TABS: 1.0000 | ORAL_TABLET | Freq: Every day | ORAL | 3 refills | Status: DC

## 2019-06-03 MED ORDER — AMLODIPINE BESYLATE 5 MG PO TABS: 5.0000 mg | ORAL_TABLET | Freq: Every day | ORAL | 3 refills | Status: DC

## 2019-06-03 NOTE — Progress Notes (Signed)
Subjective:  Patient ID: Gina [name], female    DOB: 1931/07/14  Age: 86 y.o. MRN: [id number]  CC: No chief complaint on file.   HPI Gina [name] presents for a f/u: LBP, HTN,  Pt tripped in the kitchen yesterday backwards and hit her head against a refrigerator - hard fall. No LOC. Family came and helped her to get up. C/o Back, R shoulder pain. No HA, no neck pain. No n/v.  No UTI sx's  Outpatient Medications Prior to Visit  Medication Sig Dispense Refill  . acetaminophen (TYLENOL) 500 MG tablet Take 1,000 mg by mouth 3 (three) times daily as needed (for pain.).     [name] allopurinol (ZYLOPRIM) 100 MG tablet Take 1 tablet (100 mg total) by mouth daily. 90 tablet 3  . amLODipine (NORVASC) 5 MG tablet Take 1 tablet (5 mg total) by mouth daily. 90 tablet 3  . atorvastatin (LIPITOR) 40 MG tablet Take 1 tablet (40 mg total) by mouth every evening. 90 tablet 3  . Calcium Carbonate (CALCIUM 600 PO) Take 600 mg by mouth 2 (two) times daily.     . cetirizine (ZYRTEC) 10 MG tablet Take 10 mg by mouth 2 (two) times daily.     . clopidogrel (PLAVIX) 75 MG tablet Take 1 tablet (75 mg total) by mouth daily. 90 tablet 3  . enalapril (VASOTEC) 10 MG tablet Take 1 tablet (10 mg total) by mouth daily. 90 tablet 3  . gabapentin (NEURONTIN) 400 MG capsule Take 1 capsule by mouth 4 (four) times daily.    . Multiple Vitamin (MULTIVITAMIN WITH MINERALS) TABS tablet Take 1 tablet by mouth daily.    . Multiple Vitamins-Minerals (PRESERVISION AREDS 2 PO) Take 1 tablet by mouth daily.    [name] saccharomyces boulardii (FLORASTOR) 250 MG capsule Take 250 mg by mouth daily.    [name] triamterene-hydrochlorothiazide (MAXZIDE-25) 37.5-25 MG tablet Take 1 tablet by mouth daily. 90 tablet 3  . vitamin C (ASCORBIC ACID) 500 MG tablet Take 500 mg by mouth every evening.    [name] ipratropium (ATROVENT) 0.06 % nasal spray Place 2 sprays into the nose 3 (three) times daily. (Patient not taking: Reported on 06/03/2019) 15 mL 3    No facility-administered medications prior to visit.    ROS: Review of Systems  Constitutional: Negative for activity change, appetite change, chills, fatigue and unexpected weight change.  HENT: Negative for congestion, mouth sores and sinus pressure.   Eyes: Negative for visual disturbance.  Respiratory: Negative for cough and chest tightness.   Gastrointestinal: Negative for abdominal pain and nausea.  Genitourinary: Negative for difficulty urinating, frequency and vaginal pain.  Musculoskeletal: Positive for arthralgias, back pain and gait problem.  Skin: Negative for pallor and rash.  Neurological: Negative for dizziness, tremors, weakness, numbness and headaches.  Psychiatric/Behavioral: Negative for confusion and sleep disturbance.    Objective:  BP (!) 142/70 (BP Location: Right Arm, Patient Position: Sitting, Cuff Size: Normal)   Pulse 85   Temp 98.3 F (36.8 C) (Oral)   Ht 5' (1.524 m)   Wt 188 lb (85.3 kg)   SpO2 93%   BMI 36.72 kg/m   BP Readings from Last 3 Encounters:  06/03/19 (!) 142/70  05/08/19 (!) 146/60  01/29/19 140/72    Wt Readings from Last 3 Encounters:  06/03/19 188 lb (85.3 kg)  05/08/19 187 lb 8 oz (85 kg)  01/29/19 188 lb (85.3 kg)    Physical Exam Constitutional:      General:  She is not in acute distress.    Appearance: She is well-developed.  HENT:     Head: Normocephalic.     Right Ear: External ear normal.     Left Ear: External ear normal.     Nose: Nose normal.  Eyes:     General:        Right eye: No discharge.        Left eye: No discharge.     Conjunctiva/sclera: Conjunctivae normal.     Pupils: Pupils are equal, round, and reactive to light.  Neck:     Thyroid: No thyromegaly.     Vascular: No JVD.     Trachea: No tracheal deviation.  Cardiovascular:     Rate and Rhythm: Normal rate and regular rhythm.     Heart sounds: Normal heart sounds.  Pulmonary:     Effort: No respiratory distress.     Breath sounds: No  stridor. No wheezing.  Abdominal:     General: Bowel sounds are normal. There is no distension.     Palpations: Abdomen is soft. There is no mass.     Tenderness: There is no abdominal tenderness. There is no guarding or rebound.  Musculoskeletal:        General: Tenderness present.     Cervical back: Normal range of motion and neck supple.  Lymphadenopathy:     Cervical: No cervical adenopathy.  Skin:    Findings: No erythema or rash.  Neurological:     Cranial Nerves: No cranial nerve deficit.     Motor: No abnormal muscle tone.     Coordination: Coordination normal.     Deep Tendon Reflexes: Reflexes normal.  Psychiatric:        Behavior: Behavior normal.        Thought Content: Thought content normal.        Judgment: Judgment normal.     Lab Results  Component Value Date   WBC 5.0 05/22/2019   HGB 13.3 05/22/2019   HCT 39.4 05/22/2019   PLT 162.0 05/22/2019   GLUCOSE 93 05/22/2019   CHOL 186 05/22/2019   TRIG 268.0 (H) 05/22/2019   HDL 57.80 05/22/2019   LDLDIRECT 80.0 05/22/2019   LDLCALC 42 02/23/2018   ALT 13 05/22/2019   AST 21 05/22/2019   NA 139 05/22/2019   K 4.2 05/22/2019   CL 102 05/22/2019   CREATININE 0.90 05/22/2019   BUN 26 (H) 05/22/2019   CO2 29 05/22/2019   TSH 2.85 05/22/2019   INR 0.96 07/24/2017   HGBA1C 5.6 05/22/2019    CT Abdomen Pelvis W Contrast  Result Date: 05/17/2019 CLINICAL DATA:  Abdominal pain. Single episode of blood in the stool 4 weeks ago. History of colon cancer in 2012. EXAM: CT ABDOMEN AND PELVIS WITH CONTRAST TECHNIQUE: Multidetector CT imaging of the abdomen and pelvis was performed using the standard protocol following bolus administration of intravenous contrast. CONTRAST:  15mL OMNIPAQUE IOHEXOL 300 MG/ML  SOLN COMPARISON:  05/06/2015 FINDINGS: Lower chest: Mild cardiomegaly. Mitral valve calcification. Descending thoracic aortic and right coronary artery atherosclerotic calcification. Mild atelectasis medially in the  right lower lobe. Mild subpleural scarring in the left lower lobe on image 3/4, stable. Probable scarring in the posterior basal segment right lower lobe on image 29/4 were sub solid density peers stable. Hepatobiliary: Unremarkable Pancreas: Unremarkable Spleen: Unremarkable Adrenals/Urinary Tract: Both adrenal glands appear normal. Most of the exophytic hypodense renal lesions are fluid density and roughly stable. A 1.9 by 1.6 cm  right mid kidney intraparenchymal lesion is complex with internal density of 28 Hounsfield units, and previously measured 1.7 by 1.5 cm on 05/06/2015 and 1.7 by 1.7 cm on 10/23/2012. An exophytic left kidney upper pole lesion measuring 1.5 by 1.3 cm on image 16/2 has an internal density of 25 Hounsfield units and previously measured 1.0 by 0.8 cm on 05/06/2015 and 0.7 cm in diameter on 10/23/2012. Bosniak classification for these 2 complex lesions cannot be assigned due to the lack of precontrast imaging. Stomach/Bowel: Sigmoid colon diverticulosis. Prior right hemicolectomy Vascular/Lymphatic: Aortoiliac atherosclerotic vascular disease. No pathologic adenopathy. Reproductive: Unremarkable Other: No supplemental non-categorized findings. Musculoskeletal: 2 ventral hernias are present. The lower hernia contains adipose tissue in the upper hernia contains a margin of the transverse colon. This is a similar appearance to the prior exam. 8 mm of degenerative anterolisthesis at L4-5. In conjunction with subluxation, spondylosis, and degenerative disc disease, there is bilateral foraminal impingement at L3-4, L4-5, and L5-S1, along with central narrowing of the thecal sac at L4-5. IMPRESSION: 1. A specific cause for the patient's abdominal pain is not identified. 2. Slow enlargement of single complex lesions of both kidneys over the last 7 years. Bosniak classification for these lesions cannot be assigned due to the lack of precontrast imaging. These may well represent complex benign cysts,  but if the patient has hematuria or if clinically warranted, both could be further worked up with dedicated renal protocol MRI with and without contrast. 3. Other imaging findings of potential clinical significance: Mild cardiomegaly. Mitral valve calcification. Sigmoid colon diverticulosis. Two ventral hernias, similar to prior. Lumbar spondylosis and degenerative disc disease causing multilevel impingement. Aortic Atherosclerosis (ICD10-I70.0). Electronically Signed   By: Van Clines M.D.   On: 05/17/2019 16:06    Assessment & Plan:   Diagnoses and all orders for this visit:  Malignant neoplasm of colon, unspecified part of colon (DISH)     No orders of the defined types were placed in this encounter.    Follow-up: No follow-ups on file.  Walker Kehr, MD

## 2019-06-03 NOTE — Assessment & Plan Note (Signed)
On Gabapentin 

## 2019-06-03 NOTE — Assessment & Plan Note (Signed)
No relapse 

## 2019-06-03 NOTE — Patient Instructions (Signed)
Understanding Your Risk for Falls Each year, millions of people have serious injuries from falls. It is important to understand your risk for falling. Talk with your health care provider about your risk and what you can do to lower it. There are actions you can take at home to lower your risk. If you do have a serious fall, make sure you tell your health care provider. Falling once raises your risk for falling again. How can falls affect me? Serious injuries from falls are common. These include:  Broken bones, such as hip fractures.  Head injuries, such as traumatic brain injuries (TBI). Fear of falling can also cause you to avoid activities and stay at home. This can make your muscles weaker and actually raise your risk for a fall. What can increase my risk? There are a number of risk factors that increase your risk for falling. The more risk factors you have, the higher your risk for falling. Serious injuries from a fall most often happen to people older than age 91. Children and young adults ages 20-29 are also at higher risk. Common risk factors include:  Weakness in the lower body.  Lack (deficiency) of vitamin D.  Being generally weak or confused due to long-term (chronic) illness.  Dizziness or balance problems.  Poor vision.  Medicines that cause dizziness or drowsiness. These can include medicines for your blood pressure, heart, anxiety, insomnia, or edema, as well as pain medicines and muscle relaxants. Other risk factors include:  Drinking alcohol.  Having had a fall in the past.  Having depression.  Foot pain or improper footwear.  Working at a dangerous job.  Having any of the following in your home: ? Tripping hazards, such as floor clutter or loose rugs. ? Poor lighting. ? Pets or clutter.  Dementia or memory loss. What actions can I take to lower my risk of falling?     Physical activity Maintain physical fitness. Do strength and balance exercises.  Consider taking a regular class to build strength and balance. Yoga and tai chi are good options. Vision Have your eyes checked every year and your vision prescription updated as needed. Walking aids and footwear  Wear nonskid shoes. Do not wear high heels.  Do not walk around the house in socks or slippers.  Use a cane or walker as told by your health care provider. Home safety  Attach secure railings on both sides of your stairs.  Install grab bars for your tub, shower, and toilet. Use a bath mat in your tub or shower.  Use good lighting in all rooms. Keep a flashlight near your bed.  Make sure there is a clear path from your bed to the bathroom. Use night-lights.  Do not use throw rugs. Make sure all carpeting is taped or tacked down securely.  Remove all clutter from walkways and stairways, including extension cords.  Repair uneven or broken steps.  Avoid walking on icy or slippery surfaces. Walk on the grass instead of on icy or slick sidewalks. Where you can, use ice melt to get rid of ice on walkways.  Use a cordless phone. Questions to ask your health care provider  Can you help me check my risk for a fall?  Do any of my medicines make me more likely to fall?  Should I take a vitamin D supplement?  What exercises can I do to improve my strength and balance?  Should I make an appointment to have my vision checked?  Do I  need a bone density test to check for weak bones or osteoporosis?  Would it help to use a cane or a walker? Where to find more information  Centers for Disease Control and Prevention, STEADI: www.cdc.gov  Community-Based Fall Prevention Programs: www.cdc.gov  National Institute on Aging: go4life.nia.nih.gov Contact a health care provider if:  You fall at home.  You are afraid of falling at home.  You feel weak, drowsy, or dizzy. Summary  People 65 and older are at high risk for falling. However, older people are not the only ones  injured in falls. Children and young adults have a higher-than-normal risk too.  Talk with your health care provider about your risks for falling and how to lower those risks.  Taking certain precautions at home can lower your risk for falling.  If you fall, always tell your health care provider. This information is not intended to replace advice given to you by your health care provider. Make sure you discuss any questions you have with your health care provider. Document Revised: 08/08/2018 Document Reviewed: 08/08/2018 Elsevier Patient Education  2020 [employer]  

## 2019-06-03 NOTE — Assessment & Plan Note (Signed)
Pt tripped in the kitchen yesterday backwards and hit her head against a refrigerator - hard fall. No LOC. Family came and helped her to get up. C/o Back, R shoulder pain. No HA, no neck pain. No n/v.  Exam ok, except for thor and sacrum pain - X rays

## 2019-06-03 NOTE — Assessment & Plan Note (Signed)
Norvasc, Enalapril, Triamt/HCTZ

## 2019-06-03 NOTE — Assessment & Plan Note (Signed)
Gina [name]

## 2019-06-05 ENCOUNTER — Telehealth: Payer: Self-pay

## 2019-06-05 NOTE — Telephone Encounter (Signed)
Pt notified of results

## 2019-06-05 NOTE — Telephone Encounter (Signed)
New message    Calling for test results  

## 2019-06-13 DIAGNOSIS — M654 Radial styloid tenosynovitis [de Quervain]: Secondary | ICD-10-CM | POA: Diagnosis not present

## 2019-07-02 DIAGNOSIS — M654 Radial styloid tenosynovitis [de Quervain]: Secondary | ICD-10-CM | POA: Diagnosis not present

## 2019-09-10 DIAGNOSIS — D225 Melanocytic nevi of trunk: Secondary | ICD-10-CM | POA: Diagnosis not present

## 2019-09-10 DIAGNOSIS — L304 Erythema intertrigo: Secondary | ICD-10-CM | POA: Diagnosis not present

## 2019-09-10 DIAGNOSIS — D485 Neoplasm of uncertain behavior of skin: Secondary | ICD-10-CM | POA: Diagnosis not present

## 2019-09-10 DIAGNOSIS — D2271 Melanocytic nevi of right lower limb, including hip: Secondary | ICD-10-CM | POA: Diagnosis not present

## 2019-09-10 DIAGNOSIS — L728 Other follicular cysts of the skin and subcutaneous tissue: Secondary | ICD-10-CM | POA: Diagnosis not present

## 2019-09-10 DIAGNOSIS — Z1283 Encounter for screening for malignant neoplasm of skin: Secondary | ICD-10-CM | POA: Diagnosis not present

## 2019-09-20 DIAGNOSIS — D485 Neoplasm of uncertain behavior of skin: Secondary | ICD-10-CM | POA: Diagnosis not present

## 2019-09-20 DIAGNOSIS — L98499 Non-pressure chronic ulcer of skin of other sites with unspecified severity: Secondary | ICD-10-CM | POA: Diagnosis not present

## 2019-09-23 ENCOUNTER — Encounter (INDEPENDENT_AMBULATORY_CARE_PROVIDER_SITE_OTHER): Payer: Self-pay | Admitting: Ophthalmology

## 2019-09-23 ENCOUNTER — Other Ambulatory Visit: Payer: Self-pay

## 2019-09-23 ENCOUNTER — Ambulatory Visit (INDEPENDENT_AMBULATORY_CARE_PROVIDER_SITE_OTHER): Payer: Medicare Other | Admitting: Ophthalmology

## 2019-09-23 DIAGNOSIS — H5316 Psychophysical visual disturbances: Secondary | ICD-10-CM | POA: Insufficient documentation

## 2019-09-23 DIAGNOSIS — H43813 Vitreous degeneration, bilateral: Secondary | ICD-10-CM | POA: Diagnosis not present

## 2019-09-23 DIAGNOSIS — H353134 Nonexudative age-related macular degeneration, bilateral, advanced atrophic with subfoveal involvement: Secondary | ICD-10-CM | POA: Diagnosis not present

## 2019-09-23 NOTE — Assessment & Plan Note (Signed)
The nature of age realated macular degeneration (ARMD)is explained as follows: The dry form refers to the progressive loss of normal blood supply to the central vision as a result of a combination of factors which include aging blood supply (arteriosclerosis, hardening of the arteries), genetics, smoking habits, and history of hypertension. Currently, no eye medications or vitamins slow this type of aging effect upon vision, however cessation of smoking and controlling hypertension help slow the disorder. The following analogy helps explain this: I describe the dry form of ARMD like a house of the same age as your eyes, which shows typical wear and tear of age upon the house structure and appearance. Like the aging house which can fall down structurally, the dry form of ARMD can deteriorate the structure of the macula (center) of the retina, most often gradually and affect central vision tasks such as reading and driving. The wet form of ARMD refers to the development of abnormally growing blood vessels usually near or under the central vision, with potential risk of permanent visual changes or vision losses. This complication of the Dry form of ARMD may be moderately reduced by use of AREDS 2 formula multivitamins daily. I describe the Wet form of ARMD as like the development of a fire in an aging house (DRY ARMD). It may develop suddenly, progress rapidly and be destructive based on where it starts and how big the fire is when found. In the eye, the house fire analogy refers to the abnormal blood vessels growing destructively near the central vison in the retina, or film of the eye. Halting the growth of blood vessels with laser (hot or cold) or injectable medications is best way "to put the fire out". Many patients will experience a stabilization or even improvement in vision with a treatment course, while others may still face a loss of vision. The use of injectable medications has revolutionized therapy and is  currently the only proven therapy to provide the chance of stable or improved acuity from new and recent destructive wet ARMD.

## 2019-09-23 NOTE — Progress Notes (Signed)
09/23/2019     CHIEF COMPLAINT Patient presents for Retina Follow Up   HISTORY OF PRESENT ILLNESS: Gina [name] is a 86 y.o. female who presents to the clinic today for:   HPI    Retina Follow Up    Patient presents with  Dry AMD.  In both eyes.  Duration of 1 year.  Since onset it is stable.          Comments    1 year follow up - OCT OU Patient states that her vision seems like it may be a little worse but  overall she has no complaints.        Last edited by Gerda Diss on 09/23/2019 10:26 AM. (History)      Referring physician: Cassandria Anger, MD [place],  [place] [id number]  HISTORICAL INFORMATION:   Selected notes from the MEDICAL RECORD NUMBER    Lab Results  Component Value Date   HGBA1C 5.6 05/22/2019     CURRENT MEDICATIONS: No current outpatient medications on file. (Ophthalmic Drugs)   No current facility-administered medications for this visit. (Ophthalmic Drugs)   Current Outpatient Medications (Other)  Medication Sig   acetaminophen (TYLENOL) 500 MG tablet Take 1,000 mg by mouth 3 (three) times daily as needed (for pain.).    allopurinol (ZYLOPRIM) 100 MG tablet Take 1 tablet (100 mg total) by mouth daily.   amLODipine (NORVASC) 5 MG tablet Take 1 tablet (5 mg total) by mouth daily.   atorvastatin (LIPITOR) 40 MG tablet Take 1 tablet (40 mg total) by mouth every evening.   Calcium Carbonate (CALCIUM 600 PO) Take 600 mg by mouth 2 (two) times daily.    cetirizine (ZYRTEC) 10 MG tablet Take 10 mg by mouth 2 (two) times daily.    clopidogrel (PLAVIX) 75 MG tablet Take 1 tablet (75 mg total) by mouth daily.   enalapril (VASOTEC) 10 MG tablet Take 1 tablet (10 mg total) by mouth daily.   gabapentin (NEURONTIN) 400 MG capsule Take 1 capsule (400 mg total) by mouth 4 (four) times daily.   Multiple Vitamin (MULTIVITAMIN WITH MINERALS) TABS tablet Take 1 tablet by mouth daily.   Multiple Vitamins-Minerals  (PRESERVISION AREDS 2 PO) Take 1 tablet by mouth daily.   saccharomyces boulardii (FLORASTOR) 250 MG capsule Take 250 mg by mouth daily.   triamterene-hydrochlorothiazide (MAXZIDE-25) 37.5-25 MG tablet Take 1 tablet by mouth daily.   vitamin C (ASCORBIC ACID) 500 MG tablet Take 500 mg by mouth every evening.   No current facility-administered medications for this visit. (Other)      REVIEW OF SYSTEMS:    ALLERGIES Allergies  Allergen Reactions   Penicillins Hives    Has patient had a PCN reaction causing immediate rash, facial/tongue/throat swelling, SOB or lightheadedness with hypotension:## yes ## Has patient had a PCN reaction causing severe rash involving mucus membranes or skin necrosis: no Has patient had a PCN reaction that required hospitalization: no Has patient had a PCN reaction occurring within the last 10 years: no If all of the above answers are "NO", then may proceed with Cephalosporin use.    Aspirin Hives   Adhesive [Tape] Other (See Comments)    "pulls skin off"    PAST MEDICAL HISTORY Past Medical History:  Diagnosis Date   Allergic rhinitis    seasonal   Arthritis    Colon cancer [facility]) Dec 12   Diverticulosis of colon (without mention of hemorrhage)    GI bleed  had cyst removed from spine....went back on plavix to soon   Gout    Hyperlipidemia    Hyperplastic colon polyp    Hypertension    Macular degeneration    PAD (peripheral artery disease) ([place])    Pneumonia    back in 05/2017   Stroke [facility])    TIA  10/2009   Unspecified transient cerebral ischemia sept '11   Past Surgical History:  Procedure Laterality Date   ABDOMINAL AORTOGRAM N/A 06/16/2017   Procedure: ABDOMINAL AORTOGRAM;  Surgeon: Angelia Mould, MD;  Location: [place] CV LAB;  Service: Cardiovascular;  Laterality: N/A;   APPENDECTOMY     BACK SURGERY  April '12   benign cyst spine ([place])   BREAST LUMPECTOMY     right breast '86   COLON  SURGERY  Dec '12   colectomy for colon cancer ([place])   ENDARTERECTOMY FEMORAL Bilateral 08/02/2017   ENDARTERECTOMY FEMORAL Bilateral 08/02/2017   Procedure: Bilateral Femoral ENDARTERECTOMY with Patch Angioplasty;  Surgeon: Rosetta Posner, MD;  Location: [place];  Service: Vascular;  Laterality: Bilateral;   EYE SURGERY     bilateral cataracts   LOWER EXTREMITY ANGIOGRAPHY N/A 06/16/2017   Procedure: LOWER EXTREMITY ANGIOGRAPHY;  Surgeon: Angelia Mould, MD;  Location: [place] [facility];  Service: Cardiovascular;  Laterality: N/A;   PERIPHERAL VASCULAR INTERVENTION  06/16/2017   Procedure: PERIPHERAL VASCULAR INTERVENTION;  Surgeon: Angelia Mould, MD;  Location: [place] [facility];  Service: Cardiovascular;;  left common iliac   TONSILLECTOMY      FAMILY HISTORY Family History  Problem Relation Age of Onset   Colon cancer Father    Hypertension Father    Hyperlipidemia Father    Coronary artery disease Father    Ulcers Father    Heart failure Mother    Coronary artery disease Mother    Breast cancer Mother    Hypertension Mother    Hyperlipidemia Mother    Asthma Mother    Diabetes Neg Hx     SOCIAL HISTORY Social History   Tobacco Use   Smoking status: Former Smoker    Packs/day: 1.50    Years: 30.00    Pack years: 45.00    Types: Cigarettes    Quit date: 07/21/1987    Years since quitting: 32.1   Smokeless tobacco: Never Used  Vaping Use   Vaping Use: Never used  Substance Use Topics   Alcohol use: Yes    Alcohol/week: 14.0 standard drinks    Types: 14 Glasses of wine per week    Comment: Wine daily   Drug use: No         OPHTHALMIC EXAM:  Base Eye Exam    Visual Acuity (Snellen - Linear)      Right Left   Dist cc 20/100-2 20/100-2   Dist ph cc NI NI       Tonometry (Tonopen, 10:30 AM)      Right Left   Pressure 15 15       Pupils      Pupils Dark Light Shape React APD   Right PERRL 5 4 Round Brisk None   Left  PERRL 5 4 Round Brisk None       Visual Fields (Counting fingers)      Left Right    Full Full       Extraocular Movement      Right Left    Full Full       Neuro/Psych  Oriented x3: Yes   Mood/Affect: Normal       Dilation    Both eyes: 1.0% Mydriacyl, 2.5% Phenylephrine @ 10:31 AM        Slit Lamp and Fundus Exam    External Exam      Right Left   External Normal Normal       Slit Lamp Exam      Right Left   Lids/Lashes Normal Normal   Conjunctiva/Sclera White and quiet White and quiet   Cornea Clear Clear   Anterior Chamber Deep and quiet Deep and quiet   Iris Round and reactive Round and reactive   Lens Posterior chamber intraocular lens Posterior chamber intraocular lens   Anterior Vitreous Normal Normal       Fundus Exam      Right Left   Posterior Vitreous Posterior vitreous detachment Posterior vitreous detachment   Disc Normal Normal   C/D Ratio 0.7 0.7   Macula Geographic atrophy, 8 DA size Geographic atrophy, 8 DA size   Vessels Normal Normal   Periphery Normal Normal          IMAGING AND PROCEDURES  Imaging and Procedures for 09/23/19  OCT, Retina - OU - Both Eyes       Right Eye Quality was good. Scan locations included subfoveal. Central Foveal Thickness: 252. Findings include subretinal scarring, central retinal atrophy, outer retinal atrophy.   Left Eye Quality was good. Scan locations included subfoveal. Central Foveal Thickness: 199. Progression has been stable. Findings include no SRF, no IRF, central retinal atrophy, outer retinal atrophy, inner retinal atrophy, subretinal scarring.   Notes Bilateral geographic atrophy, no active CNVM lesions on the edges of the atrophic regions                ASSESSMENT/PLAN:  Advanced nonexudative age-related macular degeneration of both eyes with subfoveal involvement The nature of age realated macular degeneration (ARMD)is explained as follows: The dry form refers to the  progressive loss of normal blood supply to the central vision as a result of a combination of factors which include aging blood supply (arteriosclerosis, hardening of the arteries), genetics, smoking habits, and history of hypertension. Currently, no eye medications or vitamins slow this type of aging effect upon vision, however cessation of smoking and controlling hypertension help slow the disorder. The following analogy helps explain this: I describe the dry form of ARMD like a house of the same age as your eyes, which shows typical wear and tear of age upon the house structure and appearance. Like the aging house which can fall down structurally, the dry form of ARMD can deteriorate the structure of the macula (center) of the retina, most often gradually and affect central vision tasks such as reading and driving. The wet form of ARMD refers to the development of abnormally growing blood vessels usually near or under the central vision, with potential risk of permanent visual changes or vision losses. This complication of the Dry form of ARMD may be moderately reduced by use of AREDS 2 formula multivitamins daily. I describe the Wet form of ARMD as like the development of a fire in an aging house (DRY ARMD). It may develop suddenly, progress rapidly and be destructive based on where it starts and how big the fire is when found. In the eye, the house fire analogy refers to the abnormal blood vessels growing destructively near the central vison in the retina, or film of the eye. Halting the growth of blood vessels  with laser (hot or cold) or injectable medications is best way to put the fire out. Many patients will experience a stabilization or even improvement in vision with a treatment course, while others may still face a loss of vision. The use of injectable medications has revolutionized therapy and is currently the only proven therapy to provide the chance of stable or improved acuity from new and recent  destructive wet ARMD.      ICD-10-CM   1. Advanced nonexudative age-related macular degeneration of both eyes with subfoveal involvement  H35.3134 OCT, Retina - OU - Both Eyes  2. 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  Condition overall stable on each eye.  Patient does have symptoms of Sherran Needs syndrome seeing iridescent shapes at times throughout the day in each eyes simultaneously  2.  3.  Ophthalmic Meds Ordered this visit:  No orders of the defined types were placed in this encounter.      Return in about 1 year (around 09/22/2020) for DILATE OU, OCT.  There are no Patient Instructions on file for this visit.   Explained the diagnoses, plan, and follow up with the patient and they expressed understanding.  Patient expressed understanding of the importance of proper follow up care.   Clent Demark Ellakate Gonsalves M.D. Diseases & Surgery of the Retina and Vitreous Retina & Diabetic [place] 09/23/19     Abbreviations: M myopia (nearsighted); A astigmatism; H hyperopia (farsighted); P presbyopia; Mrx spectacle prescription;  CTL contact lenses; OD right eye; OS left eye; OU both eyes  XT exotropia; ET esotropia; PEK punctate epithelial keratitis; PEE punctate epithelial erosions; DES dry eye syndrome; MGD meibomian gland dysfunction; ATs artificial tears; PFAT's preservative free artificial tears; [place] nuclear sclerotic cataract; PSC posterior subcapsular cataract; ERM epi-retinal membrane; PVD posterior vitreous detachment; RD retinal detachment; DM diabetes mellitus; DR diabetic retinopathy; NPDR non-proliferative diabetic retinopathy; PDR proliferative diabetic retinopathy; CSME clinically significant macular edema; DME diabetic macular edema; dbh dot blot hemorrhages; CWS cotton wool spot; POAG primary open angle glaucoma; C/D cup-to-disc ratio; HVF humphrey visual field; GVF goldmann visual field;  OCT optical coherence tomography; IOP intraocular pressure; BRVO Branch retinal vein occlusion; CRVO central retinal vein occlusion; CRAO central retinal artery occlusion; BRAO branch retinal artery occlusion; RT retinal tear; SB scleral buckle; PPV pars plana vitrectomy; VH Vitreous hemorrhage; PRP panretinal laser photocoagulation; IVK intravitreal kenalog; VMT vitreomacular traction; MH Macular hole;  NVD neovascularization of the disc; NVE neovascularization elsewhere; AREDS age related eye disease study; ARMD age related macular degeneration; POAG primary open angle glaucoma; EBMD epithelial/anterior basement membrane dystrophy; ACIOL anterior chamber intraocular lens; IOL intraocular lens; PCIOL posterior chamber intraocular lens; Phaco/IOL phacoemulsification with intraocular lens placement; [place] photorefractive keratectomy; LASIK laser assisted in situ keratomileusis; HTN hypertension; DM diabetes mellitus; COPD chronic obstructive pulmonary disease

## 2019-10-01 ENCOUNTER — Ambulatory Visit (INDEPENDENT_AMBULATORY_CARE_PROVIDER_SITE_OTHER): Payer: Medicare Other | Admitting: Internal Medicine

## 2019-10-01 ENCOUNTER — Other Ambulatory Visit: Payer: Self-pay

## 2019-10-01 ENCOUNTER — Encounter: Payer: Self-pay | Admitting: Internal Medicine

## 2019-10-01 DIAGNOSIS — M5440 Lumbago with sciatica, unspecified side: Secondary | ICD-10-CM

## 2019-10-01 DIAGNOSIS — M109 Gout, unspecified: Secondary | ICD-10-CM | POA: Diagnosis not present

## 2019-10-01 DIAGNOSIS — E785 Hyperlipidemia, unspecified: Secondary | ICD-10-CM

## 2019-10-01 DIAGNOSIS — I10 Essential (primary) hypertension: Secondary | ICD-10-CM | POA: Diagnosis not present

## 2019-10-01 DIAGNOSIS — G8929 Other chronic pain: Secondary | ICD-10-CM

## 2019-10-01 NOTE — Assessment & Plan Note (Signed)
Labs BP Readings from Last 3 Encounters:  10/01/19 (!) 140/52  06/03/19 (!) 142/70  05/08/19 (!) 146/60

## 2019-10-01 NOTE — Progress Notes (Signed)
Subjective:  Patient ID: Gina [name], female    DOB: 1932-01-27  Age: 86 y.o. MRN: [id number]  CC: No chief complaint on file.   HPI Gina [name] presents for HTN, LBP, gout f/u  Outpatient Medications Prior to Visit  Medication Sig Dispense Refill  . acetaminophen (TYLENOL) 500 MG tablet Take 1,000 mg by mouth 3 (three) times daily as needed (for pain.).     [name] allopurinol (ZYLOPRIM) 100 MG tablet Take 1 tablet (100 mg total) by mouth daily. 90 tablet 3  . amLODipine (NORVASC) 5 MG tablet Take 1 tablet (5 mg total) by mouth daily. 90 tablet 3  . atorvastatin (LIPITOR) 40 MG tablet Take 1 tablet (40 mg total) by mouth every evening. 90 tablet 3  . Calcium Carbonate (CALCIUM 600 PO) Take 600 mg by mouth 2 (two) times daily.     . cetirizine (ZYRTEC) 10 MG tablet Take 10 mg by mouth 2 (two) times daily.     . clopidogrel (PLAVIX) 75 MG tablet Take 1 tablet (75 mg total) by mouth daily. 90 tablet 3  . enalapril (VASOTEC) 10 MG tablet Take 1 tablet (10 mg total) by mouth daily. 90 tablet 3  . gabapentin (NEURONTIN) 400 MG capsule Take 1 capsule (400 mg total) by mouth 4 (four) times daily. 360 capsule 1  . Multiple Vitamin (MULTIVITAMIN WITH MINERALS) TABS tablet Take 1 tablet by mouth daily.    . Multiple Vitamins-Minerals (PRESERVISION AREDS 2 PO) Take 1 tablet by mouth daily.    [name] saccharomyces boulardii (FLORASTOR) 250 MG capsule Take 250 mg by mouth daily.    [name] triamterene-hydrochlorothiazide (MAXZIDE-25) 37.5-25 MG tablet Take 1 tablet by mouth daily. 90 tablet 3  . vitamin C (ASCORBIC ACID) 500 MG tablet Take 500 mg by mouth every evening.     No facility-administered medications prior to visit.    ROS: Review of Systems  Constitutional: Negative for activity change, appetite change, chills, fatigue and unexpected weight change.  HENT: Negative for congestion, mouth sores and sinus pressure.   Eyes: Positive for visual disturbance.  Respiratory: Negative for cough and  chest tightness.   Gastrointestinal: Negative for abdominal pain and nausea.  Genitourinary: Negative for difficulty urinating, frequency and vaginal pain.  Musculoskeletal: Positive for arthralgias. Negative for back pain and gait problem.  Skin: Negative for pallor and rash.  Neurological: Negative for dizziness, tremors, weakness, numbness and headaches.  Psychiatric/Behavioral: Negative for confusion, sleep disturbance and suicidal ideas.    Objective:  BP (!) 140/52 (BP Location: Left Arm, Patient Position: Sitting, Cuff Size: Large)   Pulse 78   Temp 98.4 F (36.9 C) (Oral)   Ht 5' (1.524 m)   Wt 188 lb (85.3 kg)   SpO2 93%   BMI 36.72 kg/m   BP Readings from Last 3 Encounters:  10/01/19 (!) 140/52  06/03/19 (!) 142/70  05/08/19 (!) 146/60    Wt Readings from Last 3 Encounters:  10/01/19 188 lb (85.3 kg)  06/03/19 188 lb (85.3 kg)  05/08/19 187 lb 8 oz (85 kg)    Physical Exam Constitutional:      General: She is not in acute distress.    Appearance: She is well-developed.  HENT:     Head: Normocephalic.     Right Ear: External ear normal.     Left Ear: External ear normal.     Nose: Nose normal.  Eyes:     General:        Right eye:  No discharge.        Left eye: No discharge.     Conjunctiva/sclera: Conjunctivae normal.     Pupils: Pupils are equal, round, and reactive to light.  Neck:     Thyroid: No thyromegaly.     Vascular: No JVD.     Trachea: No tracheal deviation.  Cardiovascular:     Rate and Rhythm: Normal rate and regular rhythm.     Heart sounds: Normal heart sounds.  Pulmonary:     Effort: No respiratory distress.     Breath sounds: No stridor. No wheezing.  Abdominal:     General: Bowel sounds are normal. There is no distension.     Palpations: Abdomen is soft. There is no mass.     Tenderness: There is no abdominal tenderness. There is no guarding or rebound.  Musculoskeletal:        General: No tenderness.     Cervical back: Normal  range of motion and neck supple.  Lymphadenopathy:     Cervical: No cervical adenopathy.  Skin:    Findings: No erythema or rash.  Neurological:     Mental Status: She is oriented to person, place, and time.     Cranial Nerves: No cranial nerve deficit.     Motor: No abnormal muscle tone.     Coordination: Coordination abnormal.     Gait: Gait abnormal.     Deep Tendon Reflexes: Reflexes normal.  Psychiatric:        Behavior: Behavior normal.        Thought Content: Thought content normal.        Judgment: Judgment normal.     Lab Results  Component Value Date   WBC 5.0 05/22/2019   HGB 13.3 05/22/2019   HCT 39.4 05/22/2019   PLT 162.0 05/22/2019   GLUCOSE 93 05/22/2019   CHOL 186 05/22/2019   TRIG 268.0 (H) 05/22/2019   HDL 57.80 05/22/2019   LDLDIRECT 80.0 05/22/2019   LDLCALC 42 02/23/2018   ALT 13 05/22/2019   AST 21 05/22/2019   NA 139 05/22/2019   K 4.2 05/22/2019   CL 102 05/22/2019   CREATININE 0.90 05/22/2019   BUN 26 (H) 05/22/2019   CO2 29 05/22/2019   TSH 2.85 05/22/2019   INR 0.96 07/24/2017   HGBA1C 5.6 05/22/2019    CT Abdomen Pelvis W Contrast  Result Date: 05/17/2019 CLINICAL DATA:  Abdominal pain. Single episode of blood in the stool 4 weeks ago. History of colon cancer in 2012. EXAM: CT ABDOMEN AND PELVIS WITH CONTRAST TECHNIQUE: Multidetector CT imaging of the abdomen and pelvis was performed using the standard protocol following bolus administration of intravenous contrast. CONTRAST:  168mL OMNIPAQUE IOHEXOL 300 MG/ML  SOLN COMPARISON:  05/06/2015 FINDINGS: Lower chest: Mild cardiomegaly. Mitral valve calcification. Descending thoracic aortic and right coronary artery atherosclerotic calcification. Mild atelectasis medially in the right lower lobe. Mild subpleural scarring in the left lower lobe on image 3/4, stable. Probable scarring in the posterior basal segment right lower lobe on image 29/4 were sub solid density peers stable. Hepatobiliary:  Unremarkable Pancreas: Unremarkable Spleen: Unremarkable Adrenals/Urinary Tract: Both adrenal glands appear normal. Most of the exophytic hypodense renal lesions are fluid density and roughly stable. A 1.9 by 1.6 cm right mid kidney intraparenchymal lesion is complex with internal density of 28 Hounsfield units, and previously measured 1.7 by 1.5 cm on 05/06/2015 and 1.7 by 1.7 cm on 10/23/2012. An exophytic left kidney upper pole lesion measuring 1.5 by 1.3  cm on image 16/2 has an internal density of 25 Hounsfield units and previously measured 1.0 by 0.8 cm on 05/06/2015 and 0.7 cm in diameter on 10/23/2012. Bosniak classification for these 2 complex lesions cannot be assigned due to the lack of precontrast imaging. Stomach/Bowel: Sigmoid colon diverticulosis. Prior right hemicolectomy Vascular/Lymphatic: Aortoiliac atherosclerotic vascular disease. No pathologic adenopathy. Reproductive: Unremarkable Other: No supplemental non-categorized findings. Musculoskeletal: 2 ventral hernias are present. The lower hernia contains adipose tissue in the upper hernia contains a margin of the transverse colon. This is a similar appearance to the prior exam. 8 mm of degenerative anterolisthesis at L4-5. In conjunction with subluxation, spondylosis, and degenerative disc disease, there is bilateral foraminal impingement at L3-4, L4-5, and L5-S1, along with central narrowing of the thecal sac at L4-5. IMPRESSION: 1. A specific cause for the patient's abdominal pain is not identified. 2. Slow enlargement of single complex lesions of both kidneys over the last 7 years. Bosniak classification for these lesions cannot be assigned due to the lack of precontrast imaging. These may well represent complex benign cysts, but if the patient has hematuria or if clinically warranted, both could be further worked up with dedicated renal protocol MRI with and without contrast. 3. Other imaging findings of potential clinical significance: Mild  cardiomegaly. Mitral valve calcification. Sigmoid colon diverticulosis. Two ventral hernias, similar to prior. Lumbar spondylosis and degenerative disc disease causing multilevel impingement. Aortic Atherosclerosis (ICD10-I70.0). Electronically Signed   By: Van Clines M.D.   On: 05/17/2019 16:06    Assessment & Plan:    Walker Kehr, MD

## 2019-10-01 NOTE — Assessment & Plan Note (Signed)
No relapsed

## 2019-10-01 NOTE — Assessment & Plan Note (Signed)
On Gabapentin No change

## 2019-10-01 NOTE — Assessment & Plan Note (Signed)
Atorvastatin 40 mg

## 2019-10-02 LAB — COMPLETE METABOLIC PANEL WITH GFR
AG Ratio: 1.6 (calc) (ref 1.0–2.5)
ALT: 14 U/L (ref 6–29)
AST: 22 U/L (ref 10–35)
Albumin: 4 g/dL (ref 3.6–5.1)
Alkaline phosphatase (APISO): 65 U/L (ref 37–153)
BUN/Creatinine Ratio: 38 (calc) — ABNORMAL HIGH (ref 6–22)
BUN: 39 mg/dL — ABNORMAL HIGH (ref 7–25)
CO2: 32 mmol/L (ref 20–32)
Calcium: 9.8 mg/dL (ref 8.6–10.4)
Chloride: 102 mmol/L (ref 98–110)
Creat: 1.03 mg/dL — ABNORMAL HIGH (ref 0.60–0.88)
GFR, Est African American: 57 mL/min/{1.73_m2} — ABNORMAL LOW (ref >=60)
GFR, Est Non African American: 49 mL/min/{1.73_m2} — ABNORMAL LOW (ref >=60)
Globulin: 2.5 g/dL (calc) (ref 1.9–3.7)
Glucose, Bld: 94 mg/dL (ref 65–99)
Potassium: 4.7 mmol/L (ref 3.5–5.3)
Sodium: 141 mmol/L (ref 135–146)
Total Bilirubin: 0.7 mg/dL (ref 0.2–1.2)
Total Protein: 6.5 g/dL (ref 6.1–8.1)

## 2019-11-07 DIAGNOSIS — Z1231 Encounter for screening mammogram for malignant neoplasm of breast: Secondary | ICD-10-CM | POA: Diagnosis not present

## 2019-11-07 LAB — HM MAMMOGRAPHY

## 2019-11-11 ENCOUNTER — Telehealth: Payer: Self-pay | Admitting: Internal Medicine

## 2019-11-11 ENCOUNTER — Encounter: Payer: Self-pay | Admitting: Internal Medicine

## 2019-11-11 MED ORDER — GABAPENTIN 400 MG PO CAPS: 400.0000 mg | ORAL_CAPSULE | Freq: Four times a day (QID) | ORAL | 1 refills | Status: DC

## 2019-11-11 NOTE — Telephone Encounter (Signed)
Patient is requesting a refill on the following medication : gabapentin (NEURONTIN) 400 MG capsule    [facility] [place], [place] to Registered [place] Sites Phone:  [phone]  Fax:  [phone]

## 2019-11-11 NOTE — Telephone Encounter (Signed)
See 11/11/19 med refill

## 2019-11-20 DIAGNOSIS — Z23 Encounter for immunization: Secondary | ICD-10-CM | POA: Diagnosis not present

## 2019-11-25 ENCOUNTER — Telehealth: Payer: Self-pay | Admitting: Internal Medicine

## 2019-11-25 NOTE — Telephone Encounter (Signed)
Patient had her 2nd COVID Vaccine in 04/2019. She wants to know if she needs to wait 8 months before she gets the booster or can she get it now.   Please follow up with patient. Thank you.

## 2019-11-26 NOTE — Telephone Encounter (Signed)
Called pt there was no answer LMOM that yes it was ok for her to get her pfizer booster now, and to have the pharmacy to send [place] the information that way we can update her chart.Marland KitchenJohny Chess

## 2019-12-03 ENCOUNTER — Other Ambulatory Visit: Payer: Self-pay

## 2019-12-03 ENCOUNTER — Ambulatory Visit (INDEPENDENT_AMBULATORY_CARE_PROVIDER_SITE_OTHER)
Admission: RE | Admit: 2019-12-03 | Discharge: 2019-12-03 | Disposition: A | Discharge status: Home / Self Care | Payer: Medicare Other | Source: Ambulatory Visit | Attending: Family | Admitting: Family

## 2019-12-03 ENCOUNTER — Encounter: Payer: Self-pay | Admitting: Family

## 2019-12-03 ENCOUNTER — Other Ambulatory Visit (INDEPENDENT_AMBULATORY_CARE_PROVIDER_SITE_OTHER): Payer: Medicare Other

## 2019-12-03 ENCOUNTER — Telehealth (INDEPENDENT_AMBULATORY_CARE_PROVIDER_SITE_OTHER): Payer: Medicare Other | Admitting: Family

## 2019-12-03 VITALS — BP 141/58 | HR 83 | Temp 97.6°F | Wt 188.0 lb

## 2019-12-03 DIAGNOSIS — R0602 Shortness of breath: Secondary | ICD-10-CM | POA: Diagnosis not present

## 2019-12-03 DIAGNOSIS — R0600 Dyspnea, unspecified: Secondary | ICD-10-CM

## 2019-12-03 DIAGNOSIS — J90 Pleural effusion, not elsewhere classified: Secondary | ICD-10-CM | POA: Diagnosis not present

## 2019-12-03 DIAGNOSIS — R0609 Other forms of dyspnea: Secondary | ICD-10-CM

## 2019-12-03 LAB — COMPREHENSIVE METABOLIC PANEL
ALT: 13 U/L (ref 0–35)
AST: 19 U/L (ref 0–37)
Albumin: 4.2 g/dL (ref 3.5–5.2)
Alkaline Phosphatase: 70 U/L (ref 39–117)
BUN: 27 mg/dL — ABNORMAL HIGH (ref 6–23)
CO2: 31 mEq/L (ref 19–32)
Calcium: 9.7 mg/dL (ref 8.4–10.5)
Chloride: 99 mEq/L (ref 96–112)
Creatinine, Ser: 0.97 mg/dL (ref 0.40–1.20)
GFR: 52.72 mL/min — ABNORMAL LOW (ref >60.00)
Glucose, Bld: 118 mg/dL — ABNORMAL HIGH (ref 70–99)
Potassium: 4 mEq/L (ref 3.5–5.1)
Sodium: 138 mEq/L (ref 135–145)
Total Bilirubin: 0.7 mg/dL (ref 0.2–1.2)
Total Protein: 7.1 g/dL (ref 6.0–8.3)

## 2019-12-03 LAB — CBC WITH DIFFERENTIAL/PLATELET
Basophils Absolute: 0 10*3/uL (ref 0.0–0.1)
Basophils Relative: 0.8 % (ref 0.0–3.0)
Eosinophils Absolute: 0.1 10*3/uL (ref 0.0–0.7)
Eosinophils Relative: 1.7 % (ref 0.0–5.0)
HCT: 41.2 % (ref 36.0–46.0)
Hemoglobin: 13.4 g/dL (ref 12.0–15.0)
Lymphocytes Relative: 30 % (ref 12.0–46.0)
Lymphs Abs: 1.8 10*3/uL (ref 0.7–4.0)
MCHC: 32.7 g/dL (ref 30.0–36.0)
MCV: 103 fl — ABNORMAL HIGH (ref 78.0–100.0)
Monocytes Absolute: 0.6 10*3/uL (ref 0.1–1.0)
Monocytes Relative: 10.2 % (ref 3.0–12.0)
Neutro Abs: 3.5 10*3/uL (ref 1.4–7.7)
Neutrophils Relative %: 57.3 % (ref 43.0–77.0)
Platelets: 190 10*3/uL (ref 150.0–400.0)
RBC: 3.99 Mil/uL (ref 3.87–5.11)
RDW: 15.3 % (ref 11.5–15.5)
WBC: 6 10*3/uL (ref 4.0–10.5)

## 2019-12-03 LAB — BRAIN NATRIURETIC PEPTIDE: Pro B Natriuretic peptide (BNP): 51 pg/mL (ref 0.0–100.0)

## 2019-12-03 NOTE — Progress Notes (Signed)
Gina [name] is a 86 y.o. female with the following history as recorded in EpicCare:  Patient Active Problem List   Diagnosis Date Noted  . Advanced nonexudative age-related macular degeneration of both eyes with subfoveal involvement 09/23/2019  . Posterior vitreous detachment of both eyes 09/23/2019  . Sherran Needs syndrome 09/23/2019  . Fall against object 06/03/2019  . Obesity 10/01/2018  . Macrocytic anemia 02/25/2018  . TIA (transient ischemic attack) 02/22/2018  . Acute lower UTI 02/22/2018  . Bruising 11/20/2017  . Ataxia 11/20/2017  . Diarrhea 09/12/2017  . Acute chest wall pain 06/14/2017  . Acute bronchitis 05/25/2017  . Abdominal distention 10/04/2016  . Elevated glucose 02/18/2016  . PAD (peripheral artery disease) ([place]) 03/17/2015  . Dysuria 05/17/2014  . Travel advice encounter 03/18/2014  . Osteopenia 03/18/2014  . Well adult exam 09/07/2013  . Routine health maintenance 09/04/2011  . Left otitis media 03/31/2011  . Vertigo 03/31/2011  . Cancer of right colon ([place]) 01/17/2011  . Low back pain 10/25/2010  . Unspecified transient cerebral ischemia 11/17/2009  . HIP PAIN, LEFT 07/22/2009  . Diverticulosis 04/24/2008  . COLONIC POLYPS, HYPERPLASTIC, HX OF 04/24/2008  . Dyslipidemia 09/08/2006  . Gout 09/08/2006  . Essential hypertension 09/08/2006  . Allergic rhinitis 09/08/2006    Current Outpatient Medications  Medication Sig Dispense Refill  . acetaminophen (TYLENOL) 500 MG tablet Take 1,000 mg by mouth 3 (three) times daily as needed (for pain.).     [name] allopurinol (ZYLOPRIM) 100 MG tablet Take 1 tablet (100 mg total) by mouth daily. 90 tablet 3  . amLODipine (NORVASC) 5 MG tablet Take 1 tablet (5 mg total) by mouth daily. 90 tablet 3  . atorvastatin (LIPITOR) 40 MG tablet Take 1 tablet (40 mg total) by mouth every evening. 90 tablet 3  . Calcium Carbonate (CALCIUM 600 PO) Take 600 mg by mouth 2 (two) times daily.     . cetirizine (ZYRTEC) 10 MG  tablet Take 10 mg by mouth 2 (two) times daily.     . clopidogrel (PLAVIX) 75 MG tablet Take 1 tablet (75 mg total) by mouth daily. 90 tablet 3  . enalapril (VASOTEC) 10 MG tablet Take 1 tablet (10 mg total) by mouth daily. 90 tablet 3  . gabapentin (NEURONTIN) 400 MG capsule Take 1 capsule (400 mg total) by mouth 4 (four) times daily. 360 capsule 1  . Multiple Vitamin (MULTIVITAMIN WITH MINERALS) TABS tablet Take 1 tablet by mouth daily.    . Multiple Vitamins-Minerals (PRESERVISION AREDS 2 PO) Take 1 tablet by mouth daily.    [name] saccharomyces boulardii (FLORASTOR) 250 MG capsule Take 250 mg by mouth daily.    [name] triamterene-hydrochlorothiazide (MAXZIDE-25) 37.5-25 MG tablet Take 1 tablet by mouth daily. 90 tablet 3  . vitamin C (ASCORBIC ACID) 500 MG tablet Take 500 mg by mouth every evening.     No current facility-administered medications for this visit.    Allergies: Penicillins, Aspirin, and Adhesive [tape]  Past Medical History:  Diagnosis Date  . Allergic rhinitis    seasonal  . Arthritis   . Colon cancer [facility]) Dec 12  . Diverticulosis of colon (without mention of hemorrhage)   . GI bleed    had cyst removed from spine....went back on plavix to soon  . Gout   . Hyperlipidemia   . Hyperplastic colon polyp   . Hypertension   . Macular degeneration   . PAD (peripheral artery disease) ([place])   . Pneumonia  back in 05/2017  . Stroke [facility])    TIA  10/2009  . Unspecified transient cerebral ischemia sept '11    Past Surgical History:  Procedure Laterality Date  . ABDOMINAL AORTOGRAM N/A 06/16/2017   Procedure: ABDOMINAL AORTOGRAM;  Surgeon: Angelia Mould, MD;  Location: [place] [facility];  Service: Cardiovascular;  Laterality: N/A;  . APPENDECTOMY    . BACK SURGERY  April '12   benign cyst spine ([place])  . BREAST LUMPECTOMY     right breast '86  . COLON SURGERY  Dec '12   colectomy for colon cancer ([place])  . ENDARTERECTOMY FEMORAL Bilateral 08/02/2017  .  ENDARTERECTOMY FEMORAL Bilateral 08/02/2017   Procedure: Bilateral Femoral ENDARTERECTOMY with Patch Angioplasty;  Surgeon: Rosetta Posner, MD;  Location: [place];  Service: Vascular;  Laterality: Bilateral;  . EYE SURGERY     bilateral cataracts  . LOWER EXTREMITY ANGIOGRAPHY N/A 06/16/2017   Procedure: LOWER EXTREMITY ANGIOGRAPHY;  Surgeon: Angelia Mould, MD;  Location: [place] CV [facility];  Service: Cardiovascular;  Laterality: N/A;  . PERIPHERAL VASCULAR INTERVENTION  06/16/2017   Procedure: PERIPHERAL VASCULAR INTERVENTION;  Surgeon: Angelia Mould, MD;  Location: [place] CV LAB;  Service: Cardiovascular;;  left common iliac  . TONSILLECTOMY      Family History  Problem Relation Age of Onset  . Colon cancer Father   . Hypertension Father   . Hyperlipidemia Father   . Coronary artery disease Father   . Ulcers Father   . Heart failure Mother   . Coronary artery disease Mother   . Breast cancer Mother   . Hypertension Mother   . Hyperlipidemia Mother   . Asthma Mother   . Diabetes Neg Hx     Social History   Tobacco Use  . Smoking status: Former Smoker    Packs/day: 1.50    Years: 30.00    Pack years: 45.00    Types: Cigarettes    Quit date: 07/21/1987    Years since quitting: 32.3  . Smokeless tobacco: Never Used  Substance Use Topics  . Alcohol use: Yes    Alcohol/week: 14.0 standard drinks    Types: 14 Glasses of wine per week    Comment: Wine daily    Subjective:   I connected with Gina [name] on 12/03/19 at 11:20 AM EDT by a video enabled telemedicine application and verified that I am speaking with the correct person using two identifiers.   I discussed the limitations of evaluation and management by telemedicine and the availability of in person appointments. The patient expressed understanding and agreed to proceed. Provider in office/ patient is at home; provider and patient are only 2 people on video call.   Patient is complaining of feeling  SOB with exertion for the past week; denies any chest pain on exertion; denies any dizziness or nausea;  notes she has had problems with wheezing at night for years but has no diagnosis of asthma/ does not think she has ever discussed the night-time wheezing with her PCP;     Objective:  Vitals:   12/03/19 1129  BP: (!) 141/58  Pulse: 83  Temp: 97.6 F (36.4 C)  Weight: 188 lb (85.3 kg)    General: Well developed, well nourished, in no acute distress  Skin : Warm and dry.  Head: Normocephalic and atraumatic  Lungs: Respirations unlabored;  Neurologic: Alert and oriented; speech intact; face symmetrical;   Assessment:  1. Shortness of breath   2.  DOE (dyspnea on exertion)     Plan:  Patient was done virtually and care is limited by transportation options; she does think she can get labs and CXR updated today; will do urgent referral to cardiology; follow-up to be determined;     No follow-ups on file.  Orders Placed This Encounter  Procedures  . DG Chest 2 View    Standing Status:   Future    Standing Expiration Date:   12/02/2020    Order Specific Question:   Reason for Exam (SYMPTOM  OR DIAGNOSIS REQUIRED)    Answer:   shortness of breath    Order Specific Question:   Preferred imaging location?    Answer:   [facility]  . CBC with Differential/Platelet    Standing Status:   Future    Standing Expiration Date:   12/02/2020  . Comp Met (CMET)    Standing Status:   Future    Standing Expiration Date:   12/02/2020  . B Nat Peptide    Standing Status:   Future    Standing Expiration Date:   12/02/2020  . Ambulatory referral to Cardiology    Referral Priority:   Urgent    Referral Type:   Consultation    Referral Reason:   Specialty Services Required    Requested Specialty:   Cardiology    Number of Visits Requested:   1    Requested Prescriptions    No prescriptions requested or ordered in this encounter

## 2019-12-05 ENCOUNTER — Other Ambulatory Visit: Payer: Self-pay

## 2019-12-05 ENCOUNTER — Encounter: Payer: Self-pay | Admitting: Cardiology

## 2019-12-05 ENCOUNTER — Encounter: Payer: Self-pay | Admitting: *Deleted

## 2019-12-05 ENCOUNTER — Ambulatory Visit (INDEPENDENT_AMBULATORY_CARE_PROVIDER_SITE_OTHER): Payer: Medicare Other | Admitting: Cardiology

## 2019-12-05 VITALS — BP 150/70 | HR 93 | Ht 60.0 in | Wt 190.0 lb

## 2019-12-05 DIAGNOSIS — G459 Transient cerebral ischemic attack, unspecified: Secondary | ICD-10-CM

## 2019-12-05 DIAGNOSIS — R0609 Other forms of dyspnea: Secondary | ICD-10-CM

## 2019-12-05 DIAGNOSIS — I739 Peripheral vascular disease, unspecified: Secondary | ICD-10-CM

## 2019-12-05 DIAGNOSIS — R0600 Dyspnea, unspecified: Secondary | ICD-10-CM | POA: Diagnosis not present

## 2019-12-05 NOTE — Patient Instructions (Signed)
Medication Instructions:  Your physician recommends that you continue on your current medications as directed. Please refer to the Current Medication list given to you today.  *If you need a refill on your cardiac medications before your next appointment, please call your pharmacy*   Lab Work: None ordered   Testing/Procedures: Your physician has requested that you have en exercise stress myoview. For further information please visit [email or web address]. Please follow instruction sheet, as given.   Follow-Up: At [facility], you and your health needs are our priority.  As part of our continuing mission to provide you with exceptional heart care, we have created designated Provider Care Teams.  These Care Teams include your primary Cardiologist (physician) and Advanced Practice Providers (APPs -  Physician Assistants and Nurse Practitioners) who all work together to provide you with the care you need, when you need it.  Your next appointment:   4 - 6 week(s)  The format for your next appointment:   In Person  Provider:   Lars Mage, MD    Thank you for choosing [facility]!!   Trinidad Curet, RN [phone]   Other Instructions

## 2019-12-05 NOTE — Progress Notes (Signed)
Electrophysiology Office Note:    Date:  12/05/2019   ID:  BROOKLYN ALFREDO, DOB [birth year], MRN [id number]  PCP:  Cassandria Anger, MD  [place] Cardiologist:  No primary care provider on file.  [facility] Electrophysiologist:  Vickie Epley, MD   Referring MD: Marrian Salvage,*   Chief Complaint: shortness of breath  History of Present Illness:    Gina [name] is a 86 y.o. female who presents for an evaluation of shortness of breath with exertion at the request of Dr Valere Dross. Their medical history includes TIA, HTN, HLD, PAD. She tells me that for the past several weeks she has experienced shortness of breath when she exerts herself. She tells me that she will walk to the mailbox or short distances and become winded. No chest pain during these episodes. This is a new symptom for her. No swelling in her lower extremities.  She was seen for the same on 10/19 in a virtual appointment. At that appointment, a CXR and labwork was ordered. Labwork showed good renal function and no anemia. CXR showed normal appearing lung fields.  Past Medical History:  Diagnosis Date  . Allergic rhinitis    seasonal  . Arthritis   . Colon cancer [facility]) Dec 12  . Diverticulosis of colon (without mention of hemorrhage)   . GI bleed    had cyst removed from spine....went back on plavix to soon  . Gout   . Hyperlipidemia   . Hyperplastic colon polyp   . Hypertension   . Macular degeneration   . PAD (peripheral artery disease) ([place])   . Pneumonia    back in 05/2017  . Stroke [facility])    TIA  10/2009  . Unspecified transient cerebral ischemia sept '11    Past Surgical History:  Procedure Laterality Date  . ABDOMINAL AORTOGRAM N/A 06/16/2017   Procedure: ABDOMINAL AORTOGRAM;  Surgeon: Angelia Mould, MD;  Location: [place] [facility];  Service: Cardiovascular;  Laterality: N/A;  . APPENDECTOMY    . BACK SURGERY  April '12   benign cyst spine ([place])  . BREAST  LUMPECTOMY     right breast '86  . COLON SURGERY  Dec '12   colectomy for colon cancer ([place])  . ENDARTERECTOMY FEMORAL Bilateral 08/02/2017  . ENDARTERECTOMY FEMORAL Bilateral 08/02/2017   Procedure: Bilateral Femoral ENDARTERECTOMY with Patch Angioplasty;  Surgeon: Rosetta Posner, MD;  Location: [place];  Service: Vascular;  Laterality: Bilateral;  . EYE SURGERY     bilateral cataracts  . LOWER EXTREMITY ANGIOGRAPHY N/A 06/16/2017   Procedure: LOWER EXTREMITY ANGIOGRAPHY;  Surgeon: Angelia Mould, MD;  Location: [place] CV LAB;  Service: Cardiovascular;  Laterality: N/A;  . PERIPHERAL VASCULAR INTERVENTION  06/16/2017   Procedure: PERIPHERAL VASCULAR INTERVENTION;  Surgeon: Angelia Mould, MD;  Location: [place] CV LAB;  Service: Cardiovascular;;  left common iliac  . TONSILLECTOMY      Current Medications: Current Meds  Medication Sig  . acetaminophen (TYLENOL) 500 MG tablet Take 1,000 mg by mouth daily. 4 tabs daily  . allopurinol (ZYLOPRIM) 100 MG tablet Take 1 tablet (100 mg total) by mouth daily.  [name] amLODipine (NORVASC) 5 MG tablet Take 1 tablet (5 mg total) by mouth daily.  [name] atorvastatin (LIPITOR) 40 MG tablet Take 1 tablet (40 mg total) by mouth every evening.  . Calcium Carbonate (CALCIUM 600 PO) Take 600 mg by mouth 2 (two) times daily.   . cetirizine (ZYRTEC) 10 MG tablet  Take 10 mg by mouth 2 (two) times daily.   . clopidogrel (PLAVIX) 75 MG tablet Take 1 tablet (75 mg total) by mouth daily.  . enalapril (VASOTEC) 10 MG tablet Take 1 tablet (10 mg total) by mouth daily.  [name] gabapentin (NEURONTIN) 400 MG capsule Take 1 capsule (400 mg total) by mouth 4 (four) times daily.  [name] ketoconazole (NIZORAL) 2 % cream as needed.  . Multiple Vitamin (MULTIVITAMIN WITH MINERALS) TABS tablet Take 1 tablet by mouth daily.  . Multiple Vitamins-Minerals (PRESERVISION AREDS 2 PO) Take 1 tablet by mouth daily.  [name] saccharomyces boulardii (FLORASTOR) 250 MG capsule Take 250 mg  by mouth daily.  [name] triamterene-hydrochlorothiazide (MAXZIDE-25) 37.5-25 MG tablet Take 1 tablet by mouth daily.  . vitamin C (ASCORBIC ACID) 500 MG tablet Take 500 mg by mouth every evening.     Allergies:   Penicillins, Aspirin, and Adhesive [tape]   Social History   Socioeconomic History  . Marital status: Widowed    Spouse name: Not on file  . Number of children: 3  . Years of education: 56  . Highest education level: Not on file  Occupational History  . Occupation: Tree surgeon: RETIRED    Comment: retired  Tobacco Use  . Smoking status: Former Smoker    Packs/day: 1.50    Years: 30.00    Pack years: 45.00    Types: Cigarettes    Quit date: 07/21/1987    Years since quitting: 32.3  . Smokeless tobacco: Never Used  Vaping Use  . Vaping Use: Never used  Substance and Sexual Activity  . Alcohol use: Yes    Alcohol/week: 14.0 standard drinks    Types: 14 Glasses of wine per week    Comment: Wine daily  . Drug use: No  . Sexual activity: Not Currently  Other Topics Concern  . Not on file  Social History Narrative   HSG, Married 1955 widowed 2010. 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 Passed away 2008/07/21 Her son  moved to [place] (July '10). End of life care:  no extra ordinary measures: wants cardiac resusitation and mechanical ventilation short-term if needed. Does not want to be kept in a persistant vegative state or long term artifical life support.    Social Determinants of Health   Financial Resource Strain:   . Difficulty of Paying Living Expenses: Not on file  Food Insecurity:   . Worried About Charity fundraiser in the Last Year: Not on file  . Ran Out of Food in the Last Year: Not on file  Transportation Needs:   . Lack of Transportation (Medical): Not on file  . Lack of  Transportation (Non-Medical): Not on file  Physical Activity:   . Days of Exercise per Week: Not on file  . Minutes of Exercise per Session: Not on file  Stress:   . Feeling of Stress : Not on file  Social Connections:   . Frequency of Communication with Friends and Family: Not on file  . Frequency of Social Gatherings with Friends and Family: Not on file  . Attends Religious Services: Not on file  . Active Member of Clubs or Organizations: Not on file  . Attends Archivist Meetings: Not on file  . Marital Status: Not on file     Family History:  The patient's family history includes Asthma in her mother; Breast cancer in her mother; Colon cancer in her father; Coronary artery disease in her father and mother; Heart failure in her mother; Hyperlipidemia in her father and mother; Hypertension in her father and mother; Ulcers in her father. There is no history of Diabetes.  ROS:   Please see the history of present illness.    All other systems reviewed and are negative.  EKGs/Labs/Other Studies Reviewed:    The following studies were reviewed today: CXR, labwork, previous notes  CXR 12/03/2019 personally reviewed shows no significant lung abnormality. There is calcification within the aortic contours.  Labwork shows stable renal function and no anemia.  EKG:  The ekg ordered today demonstrates sinus rhythm.  Recent Labs: 05/22/2019: TSH 2.85 12/03/2019: ALT 13; BUN 27; Creatinine, Ser 0.97; Hemoglobin 13.4; Platelets 190.0; Potassium 4.0; Pro B Natriuretic peptide (BNP) 51.0; Sodium 138  Recent Lipid Panel    Component Value Date/Time   CHOL 186 05/22/2019 0951   TRIG 268.0 (H) 05/22/2019 0951   TRIG 192 (H) 01/12/2006 0745   HDL 57.80 05/22/2019 0951   CHOLHDL 3 05/22/2019 0951   VLDL 53.6 (H) 05/22/2019 0951   LDLCALC 42 02/23/2018 0658   LDLDIRECT 80.0 05/22/2019 0951    Physical Exam:    VS:  BP (!) 150/70   Pulse 93   Ht 5' (1.524 m)   Wt 190 lb (86.2  kg)   SpO2 94%   BMI 37.11 kg/m     Wt Readings from Last 3 Encounters:  12/05/19 190 lb (86.2 kg)  12/03/19 188 lb (85.3 kg)  10/01/19 188 lb (85.3 kg)     GEN:  Well nourished, well developed in no acute distress HEENT: Normal NECK: No JVD; No carotid bruits LYMPHATICS: No lymphadenopathy CARDIAC: RRR, no murmurs, rubs, gallops RESPIRATORY:  Clear to auscultation without rales, wheezing or rhonchi  ABDOMEN: Soft, non-tender, non-distended MUSCULOSKELETAL:  No edema; No deformity  SKIN: Warm and dry NEUROLOGIC:  Alert and oriented x 3 PSYCHIATRIC:  Normal affect   ASSESSMENT:    1. DOE (dyspnea on exertion)   2. PAD (peripheral artery disease) ([place])   3. TIA (transient ischemic attack)    PLAN:    In order of problems listed above:  1. Dyspnea on exertion Unclear etiology. Echo in 2020 showed normal LV function. There was AV sclerosis but not stenosis on that echo. No history of CAD although she has aortic calcifications on CXR and a history of TIA so it is certainly possible her symptoms are an anginal equivalent. Will plan on a nuclear stress to evaluate for this possibility.   2. PAD Her PAD doesn't seem to be limiting her exertional capacity.  3. TIA Continue clopidogrel.    Medication Adjustments/Labs and Tests Ordered: Current medicines are reviewed at length with the patient today.  Concerns regarding medicines are outlined above.  Orders Placed This Encounter  Procedures  . Myocardial Perfusion Imaging  . EKG 12-Lead   No orders of the defined types were placed in this encounter.    Signed, Lars Mage, MD, [facility]  12/05/2019 7:31 PM    Electrophysiology [place]

## 2019-12-06 DIAGNOSIS — Z23 Encounter for immunization: Secondary | ICD-10-CM | POA: Diagnosis not present

## 2019-12-10 ENCOUNTER — Other Ambulatory Visit (HOSPITAL_COMMUNITY)
Admission: RE | Admit: 2019-12-10 | Discharge: 2019-12-10 | Disposition: A | Discharge status: Home / Self Care | Payer: Medicare Other | Source: Ambulatory Visit | Attending: Cardiology | Admitting: Cardiology

## 2019-12-10 DIAGNOSIS — Z01812 Encounter for preprocedural laboratory examination: Secondary | ICD-10-CM | POA: Insufficient documentation

## 2019-12-10 DIAGNOSIS — Z20822 Contact with and (suspected) exposure to covid-19: Secondary | ICD-10-CM | POA: Diagnosis not present

## 2019-12-10 LAB — SARS CORONAVIRUS 2 (TAT 6-24 HRS): SARS Coronavirus 2: NEGATIVE

## 2019-12-11 ENCOUNTER — Telehealth (HOSPITAL_COMMUNITY): Payer: Self-pay | Admitting: *Deleted

## 2019-12-11 NOTE — Telephone Encounter (Signed)
Patient given detailed instructions per Myocardial Perfusion Study Information Sheet for the test on 12/13/19 at 10:30. Patient notified to arrive 15 minutes early and that it is imperative to arrive on time for appointment to keep from having the test rescheduled.  If you need to cancel or reschedule your appointment, please call the office within 24 hours of your appointment. . Patient verbalized understanding.Gina [name]

## 2019-12-13 ENCOUNTER — Other Ambulatory Visit: Payer: Self-pay

## 2019-12-13 ENCOUNTER — Ambulatory Visit (HOSPITAL_COMMUNITY): Payer: Medicare Other | Attending: Internal Medicine

## 2019-12-13 DIAGNOSIS — R0600 Dyspnea, unspecified: Secondary | ICD-10-CM | POA: Insufficient documentation

## 2019-12-13 DIAGNOSIS — R0609 Other forms of dyspnea: Secondary | ICD-10-CM

## 2019-12-13 LAB — MYOCARDIAL PERFUSION IMAGING
LV dias vol: 73 mL (ref 46–106)
LV sys vol: 25 mL
Peak HR: 101 {beats}/min
Rest HR: 83 {beats}/min
SDS: 0
SRS: 0
SSS: 0
TID: 1

## 2019-12-13 MED ORDER — REGADENOSON 0.4 MG/5ML IV SOLN
0.4000 mg | Freq: Once | INTRAVENOUS | Status: AC
  Administered 2019-12-13: 0.4 mg via INTRAVENOUS

## 2019-12-13 MED ORDER — TECHNETIUM TC 99M TETROFOSMIN IV KIT
30.7000 | PACK | Freq: Once | INTRAVENOUS | Status: AC | PRN
  Administered 2019-12-13: 30.7 via INTRAVENOUS
  Filled 2019-12-13: qty 31

## 2019-12-13 MED ORDER — TECHNETIUM TC 99M TETROFOSMIN IV KIT
10.0000 | PACK | Freq: Once | INTRAVENOUS | Status: AC | PRN
  Administered 2019-12-13: 10 via INTRAVENOUS
  Filled 2019-12-13: qty 10

## 2019-12-18 ENCOUNTER — Telehealth: Payer: Self-pay | Admitting: Cardiology

## 2019-12-18 NOTE — Telephone Encounter (Signed)
Returned call to pt.  Pt would like to cancel her f/u appt based on results of stress test.  She will follow with her PCP.  appt cancelled.

## 2019-12-18 NOTE — Telephone Encounter (Signed)
New Message:   Pt says she had her test and received her results. She wants to know if she needs to keep her appt with Dr Quentin Ore on 01-06-20?

## 2020-01-06 ENCOUNTER — Ambulatory Visit: Payer: Medicare Other | Admitting: Cardiology

## 2020-02-04 ENCOUNTER — Other Ambulatory Visit: Payer: Self-pay

## 2020-02-04 ENCOUNTER — Encounter: Payer: Self-pay | Admitting: Internal Medicine

## 2020-02-04 ENCOUNTER — Ambulatory Visit (INDEPENDENT_AMBULATORY_CARE_PROVIDER_SITE_OTHER): Payer: Medicare Other | Admitting: Internal Medicine

## 2020-02-04 DIAGNOSIS — Z6836 Body mass index (BMI) 36.0-36.9, adult: Secondary | ICD-10-CM

## 2020-02-04 DIAGNOSIS — M109 Gout, unspecified: Secondary | ICD-10-CM | POA: Diagnosis not present

## 2020-02-04 DIAGNOSIS — I809 Phlebitis and thrombophlebitis of unspecified site: Secondary | ICD-10-CM

## 2020-02-04 DIAGNOSIS — R062 Wheezing: Secondary | ICD-10-CM | POA: Diagnosis not present

## 2020-02-04 LAB — BASIC METABOLIC PANEL
BUN: 25 mg/dL — ABNORMAL HIGH (ref 6–23)
CO2: 34 mEq/L — ABNORMAL HIGH (ref 19–32)
Calcium: 10.1 mg/dL (ref 8.4–10.5)
Chloride: 97 mEq/L (ref 96–112)
Creatinine, Ser: 0.97 mg/dL (ref 0.40–1.20)
GFR: 52.87 mL/min — ABNORMAL LOW (ref >60.00)
Glucose, Bld: 94 mg/dL (ref 70–99)
Potassium: 4.3 mEq/L (ref 3.5–5.1)
Sodium: 136 mEq/L (ref 135–145)

## 2020-02-04 MED ORDER — BREZTRI AEROSPHERE 160-9-4.8 MCG/ACT IN AERO
2.0000 | INHALATION_SPRAY | Freq: Every day | RESPIRATORY_TRACT | 11 refills | Status: DC
Start: 2020-02-04 — End: 2020-02-24

## 2020-02-04 NOTE — Assessment & Plan Note (Signed)
On Plavix ACE wrap - can't tolerate socks Voltaren gel Use Xarelto 2.5 mg once a day x 1-2 weeks if not better

## 2020-02-04 NOTE — Progress Notes (Signed)
Subjective:  Patient ID: Gina [name], female    DOB: 1932/01/17  Age: 86 y.o. MRN: [id number]  CC: Follow-up (4 month f/u)   HPI DOMINO HOLTEN presents for a 4 mo f/u on HTN, PAD, TIA  C/o DOE/wheezing in October at night - better now; nasal congestion at times  Outpatient Medications Prior to Visit  Medication Sig Dispense Refill  . acetaminophen (TYLENOL) 500 MG tablet Take 1,000 mg by mouth daily. 4 tabs daily    . allopurinol (ZYLOPRIM) 100 MG tablet Take 1 tablet (100 mg total) by mouth daily. 90 tablet 3  . amLODipine (NORVASC) 5 MG tablet Take 1 tablet (5 mg total) by mouth daily. 90 tablet 3  . atorvastatin (LIPITOR) 40 MG tablet Take 1 tablet (40 mg total) by mouth every evening. 90 tablet 3  . Calcium Carbonate (CALCIUM 600 PO) Take 600 mg by mouth 2 (two) times daily.    . cetirizine (ZYRTEC) 10 MG tablet Take 10 mg by mouth 2 (two) times daily.     . clopidogrel (PLAVIX) 75 MG tablet Take 1 tablet (75 mg total) by mouth daily. 90 tablet 3  . enalapril (VASOTEC) 10 MG tablet Take 1 tablet (10 mg total) by mouth daily. 90 tablet 3  . gabapentin (NEURONTIN) 400 MG capsule Take 1 capsule (400 mg total) by mouth 4 (four) times daily. 360 capsule 1  . ketoconazole (NIZORAL) 2 % cream as needed.    . Multiple Vitamin (MULTIVITAMIN WITH MINERALS) TABS tablet Take 1 tablet by mouth daily.    . Multiple Vitamins-Minerals (PRESERVISION AREDS 2 PO) Take 1 tablet by mouth daily.    [name] saccharomyces boulardii (FLORASTOR) 250 MG capsule Take 250 mg by mouth daily.    [name] triamterene-hydrochlorothiazide (MAXZIDE-25) 37.5-25 MG tablet Take 1 tablet by mouth daily. 90 tablet 3  . vitamin C (ASCORBIC ACID) 500 MG tablet Take 500 mg by mouth every evening.     No facility-administered medications prior to visit.    ROS: Review of Systems  Constitutional: Negative for activity change, appetite change, chills, fatigue and unexpected weight change.  HENT: Negative for congestion,  mouth sores and sinus pressure.   Eyes: Negative for visual disturbance.  Respiratory: Positive for shortness of breath and wheezing. Negative for cough and chest tightness.   Gastrointestinal: Negative for abdominal pain and nausea.  Genitourinary: Negative for difficulty urinating, frequency and vaginal pain.  Musculoskeletal: Positive for arthralgias, back pain and gait problem.  Skin: Negative for pallor and rash.  Neurological: Negative for dizziness, tremors, weakness, numbness and headaches.  Psychiatric/Behavioral: Negative for confusion and sleep disturbance.    Objective:  BP 132/70 (BP Location: Left Arm)   Pulse 83   Temp 98.2 F (36.8 C) (Oral)   Wt 190 lb 6.4 oz (86.4 kg)   SpO2 95%   BMI 37.18 kg/m   BP Readings from Last 3 Encounters:  02/04/20 132/70  12/05/19 (!) 150/70  12/03/19 (!) 141/58    Wt Readings from Last 3 Encounters:  02/04/20 190 lb 6.4 oz (86.4 kg)  12/13/19 190 lb (86.2 kg)  12/05/19 190 lb (86.2 kg)    Physical Exam Constitutional:      General: She is not in acute distress.    Appearance: She is well-developed. She is obese.  HENT:     Head: Normocephalic.     Right Ear: External ear normal.     Left Ear: External ear normal.     Nose: Nose normal.  Mouth/Throat:     Mouth: Oropharynx is clear and moist.  Eyes:     General:        Right eye: No discharge.        Left eye: No discharge.     Conjunctiva/sclera: Conjunctivae normal.     Pupils: Pupils are equal, round, and reactive to light.  Neck:     Thyroid: No thyromegaly.     Vascular: No JVD.     Trachea: No tracheal deviation.  Cardiovascular:     Rate and Rhythm: Normal rate and regular rhythm.     Heart sounds: Normal heart sounds.  Pulmonary:     Effort: No respiratory distress.     Breath sounds: No stridor. No wheezing.  Abdominal:     General: Bowel sounds are normal. There is no distension.     Palpations: Abdomen is soft. There is no mass.     Tenderness:  There is no abdominal tenderness. There is no guarding or rebound.  Musculoskeletal:        General: No tenderness or edema.     Cervical back: Normal range of motion and neck supple.  Lymphadenopathy:     Cervical: No cervical adenopathy.  Skin:    Findings: No erythema or rash.  Neurological:     Mental Status: She is oriented to person, place, and time.     Cranial Nerves: No cranial nerve deficit.     Motor: No abnormal muscle tone.     Coordination: Coordination normal.     Deep Tendon Reflexes: Reflexes normal.  Psychiatric:        Mood and Affect: Mood and affect normal.        Behavior: Behavior normal.        Thought Content: Thought content normal.        Judgment: Judgment normal.   L calf tender veins  Lab Results  Component Value Date   WBC 6.0 12/03/2019   HGB 13.4 12/03/2019   HCT 41.2 12/03/2019   PLT 190.0 12/03/2019   GLUCOSE 118 (H) 12/03/2019   CHOL 186 05/22/2019   TRIG 268.0 (H) 05/22/2019   HDL 57.80 05/22/2019   LDLDIRECT 80.0 05/22/2019   LDLCALC 42 02/23/2018   ALT 13 12/03/2019   AST 19 12/03/2019   NA 138 12/03/2019   K 4.0 12/03/2019   CL 99 12/03/2019   CREATININE 0.97 12/03/2019   BUN 27 (H) 12/03/2019   CO2 31 12/03/2019   TSH 2.85 05/22/2019   INR 0.96 07/24/2017   HGBA1C 5.6 05/22/2019    No results found.  Assessment & Plan:    Walker Kehr, MD

## 2020-02-04 NOTE — Patient Instructions (Addendum)
Continue Plavix ACE wrap  Voltaren gel Use Xarelto 2.5 mg once a day x 1-2 weeks if not better  Breztri 2 puffs at night      Phlebitis Phlebitis is soreness and swelling (inflammation) of a vein. This can occur in your arms, legs, or torso (trunk), as well as deeper inside your body. Phlebitis is usually not serious when it occurs close to the surface of the body. However, it can cause serious problems when it occurs deeper inside the body. What are the causes? Phlebitis can be caused by:  Having a needle, IV tube, or long, thin tube (catheter) put in the vein.  Getting certain medicines or solutions through an IV tube. Some medicines or solutions, such as antibiotics and cancer medicines, can irritate the vein.  Having an IV tube for a long period of time.  Having an IV placed on the part of the arm or hand that moves a lot.  A blood clot.  Infection of the vein.  Surgery on a vein. What increases the risk? The following factors may make you more likely to develop this condition:  Being overweight or obese.  Pregnancy.  Cancer.  Reduced or slowing of blood flow through your veins. This can be caused by: ? Bed rest over a long period of time. ? Long distance travel. ? Injury. ? Surgery. ? Congestive heart failure. ? Inactivity.  Smoking.  Taking birth control pills or hormone replacement therapy.  Varicose veins.  Inflammatory diseases or blood disorders that increase clotting.  Taking illegal drugs through the vein.  Having a history of blood clots. What are the signs or symptoms? Symptoms of this condition include:  A red, tender, swollen, and painful area on your skin. Usually, the area is long and narrow, and may spread.  The affected area feeling warm to the touch.  Firmness along the center of the affected area.  Low fever. How is this diagnosed? This condition is diagnosed based on:  Your symptoms.  A physical exam.  Your medical  history, including your family history. Other tests may be done to rule out other conditions, such as blood clots, especially if you have a history of blood clots or are at higher risk of developing blood clots. These may include:  Blood tests.  Ultrasound exam.  Genetic tests.  Biopsy. This is when a tissue is taken from the body for examination. This is rare. How is this treated? Treatment depends on the severity of the condition as well as the location of the inflammation. In most cases, the condition is minor and gets better quickly. Treatment may include:  Applying a warm compress or heating pad.  Wearing compression stockings or bandages.  Medicines, such as: ? Anti-inflammatory medicines. ? Antibiotic medicines if an infection is present. ? Blood-thinning medicines if a blood clot is suspected or present, or if you have a history of blood clots or a blood disorder.  Removing an IV tube that may be causing the problem.  Using a different medicine or solution that will not irritate the vein. In rare cases, surgery may be needed to remove a damaged section of a vein. Follow these instructions at home: Managing pain, stiffness, and swelling  If directed, apply heat to the affected area as often as told by your health care provider. Use the heat source that your health care provider recommends, such as a moist heat pack or a heating pad. ? Place a towel between your skin and the heat  source. ? Leave the heat on for 20-30 minutes. ? Remove the heat if your skin turns bright red. This is especially important if you are unable to feel pain, heat, or cold. You may have a greater risk of getting burned.  Raise (elevate) the affected area above the level of your heart while you are sitting or lying down. Medicines  Take over-the-counter and prescription medicines only as told by your health care provider.  If you were prescribed an antibiotic, take it as told by your health care  provider. Do not stop using the antibiotic even if you start to feel better.  Carry a medical alert card or wear your medical alert jewelry to show that you are on blood thinners, if this applies. General instructions   If you have phlebitis in your legs: ? Avoid sitting or standing for a long time. ? Keep your legs moving. ? Try to take short walks to break up long periods of sitting. ? Try to avoid bed rest that lasts for long periods of time. Regular sleep is not part of bed rest.  Wear compression stockings as told by your health care provider. These stockings reduce swelling in your legs and help to prevent blood clots. They also help to prevent the condition from coming back.  Do not use any products that contain nicotine or tobacco, such as cigarettes and e-cigarettes. If you need help quitting, ask your health care provider.  Keep all follow-up visits as told by your health care provider. This is important. This may include any follow-up blood tests. Contact a health care provider if:  You have unusual bruising or any bleeding problems.  Your symptoms do not get better, or your symptoms get worse.  You are on anti-inflammatory medicines and you develop abdominal pain. Get help right away if:  You suddenly have chest pain or trouble breathing.  You have a fever and your symptoms suddenly get worse.  You cough up blood.  You feel lightheaded or pass out.  You have severe pain and swelling in the affected arm or leg. These symptoms may represent a serious problem that is an emergency. Do not wait to see if the symptoms will go away. Get medical help right away. Call your local emergency services (911 in the [place] Do not drive yourself to the hospital. Summary  Phlebitis is soreness and swelling (inflammation) of a vein.  Phlebitis is usually not serious when it occurs close to the surface of the body. However, it can cause serious problems when it occurs deeper inside  the body.  Treatment depends on the severity of the condition and the location of the inflammation.  Raise (elevate) the affected area above the level of your heart while you are sitting or lying down. This information is not intended to replace advice given to you by your health care provider. Make sure you discuss any questions you have with your health care provider. Document Revised: 03/13/2018 Document Reviewed: 03/08/2016 Elsevier Patient Education  2020 [employer].

## 2020-02-04 NOTE — Addendum Note (Signed)
Addended by: Trenda Moots on: 75/17/0017 12:01 PM   Modules accepted: Orders

## 2020-02-04 NOTE — Assessment & Plan Note (Signed)
  On diet  

## 2020-02-04 NOTE — Assessment & Plan Note (Signed)
Asthmatic sx's Breztri 2 puffs at night Cardiac w/up was (-)

## 2020-02-04 NOTE — Assessment & Plan Note (Signed)
Continue to take Allopurinol

## 2020-02-05 ENCOUNTER — Telehealth: Payer: Self-pay | Admitting: Internal Medicine

## 2020-02-05 NOTE — Telephone Encounter (Signed)
It should be okay.  Voltaren gel warnings are copied from the Voltaren tablets leaflet. Test it by applying a small amount on the inner forearm and see if anything happens with the skin.  If no reaction, use Voltaren gel as we discussed. thanks

## 2020-02-05 NOTE — Telephone Encounter (Signed)
  Patient went and got voltaren gel after her appointment yesterday and on the label it says not to take it if allergic to Asprin and she is so she wanted to reach out to fing out what she should do.

## 2020-02-05 NOTE — Telephone Encounter (Signed)
Notified pt w/MD response.../lmb 

## 2020-02-24 ENCOUNTER — Telehealth: Payer: Self-pay | Admitting: Internal Medicine

## 2020-02-24 MED ORDER — BREZTRI AEROSPHERE 160-9-4.8 MCG/ACT IN AERO
2.0000 | INHALATION_SPRAY | Freq: Every day | RESPIRATORY_TRACT | 11 refills | Status: DC
Start: 2020-02-24 — End: 2020-06-17

## 2020-02-24 MED ORDER — BREZTRI AEROSPHERE 160-9-4.8 MCG/ACT IN AERO
2.0000 | INHALATION_SPRAY | Freq: Every day | RESPIRATORY_TRACT | 11 refills | Status: DC
Start: 2020-02-24 — End: 2020-02-24

## 2020-02-24 NOTE — Telephone Encounter (Signed)
Patient calling wondering if we could send her in another inhaler that was given to her on 12.21.21. States it has been helping her.  [id number] [address], [address] AT [facility] OF [address] Phone:  [phone]  Fax:  [phone]

## 2020-02-24 NOTE — Telephone Encounter (Signed)
Called pt to verify msg. She states the [place] help her out a lot, and she would like a rx. Inform pt per chart MD sent rx pharmacy on file. Inform will resend due to it stating " no print". Tried to send electronically rx would not go. Printed rx faxed manually to [facility].Marland KitchenJohny Chess

## 2020-04-03 DIAGNOSIS — D485 Neoplasm of uncertain behavior of skin: Secondary | ICD-10-CM | POA: Diagnosis not present

## 2020-04-03 DIAGNOSIS — L821 Other seborrheic keratosis: Secondary | ICD-10-CM | POA: Diagnosis not present

## 2020-05-06 ENCOUNTER — Ambulatory Visit (INDEPENDENT_AMBULATORY_CARE_PROVIDER_SITE_OTHER): Payer: Medicare Other | Admitting: Internal Medicine

## 2020-05-06 ENCOUNTER — Encounter: Payer: Self-pay | Admitting: Internal Medicine

## 2020-05-06 ENCOUNTER — Other Ambulatory Visit: Payer: Self-pay

## 2020-05-06 DIAGNOSIS — C189 Malignant neoplasm of colon, unspecified: Secondary | ICD-10-CM | POA: Diagnosis not present

## 2020-05-06 DIAGNOSIS — M5440 Lumbago with sciatica, unspecified side: Secondary | ICD-10-CM | POA: Diagnosis not present

## 2020-05-06 DIAGNOSIS — M109 Gout, unspecified: Secondary | ICD-10-CM

## 2020-05-06 DIAGNOSIS — R062 Wheezing: Secondary | ICD-10-CM | POA: Diagnosis not present

## 2020-05-06 DIAGNOSIS — G8929 Other chronic pain: Secondary | ICD-10-CM | POA: Diagnosis not present

## 2020-05-06 MED ORDER — ATORVASTATIN CALCIUM 40 MG PO TABS: 40.0000 mg | ORAL_TABLET | Freq: Every evening | ORAL | 3 refills | Status: DC

## 2020-05-06 MED ORDER — CLOPIDOGREL BISULFATE 75 MG PO TABS
75.0000 mg | ORAL_TABLET | Freq: Every day | ORAL | 3 refills | Status: DC
Start: 2020-05-06 — End: 2020-10-01

## 2020-05-06 MED ORDER — AMLODIPINE BESYLATE 5 MG PO TABS: 5.0000 mg | ORAL_TABLET | Freq: Every day | ORAL | 3 refills | Status: DC

## 2020-05-06 MED ORDER — LOSARTAN POTASSIUM 50 MG PO TABS: 50.0000 mg | ORAL_TABLET | Freq: Every day | ORAL | 3 refills | Status: DC

## 2020-05-06 MED ORDER — ALLOPURINOL 100 MG PO TABS: 100.0000 mg | ORAL_TABLET | Freq: Every day | ORAL | 3 refills | Status: DC

## 2020-05-06 MED ORDER — GABAPENTIN 400 MG PO CAPS: 400.0000 mg | ORAL_CAPSULE | Freq: Four times a day (QID) | ORAL | 3 refills | Status: DC

## 2020-05-06 MED ORDER — TRIAMTERENE-HCTZ 37.5-25 MG PO TABS: 1.0000 | ORAL_TABLET | Freq: Every day | ORAL | 3 refills | Status: DC

## 2020-05-06 NOTE — Assessment & Plan Note (Signed)
Gabapentin is helping

## 2020-05-06 NOTE — Progress Notes (Signed)
Subjective:  Patient ID: Gina [name], female    DOB: 28-Oct-1931  Age: 87 y.o. MRN: [id number]  CC: Follow-up (3 month f/u- Requesting refills on all med sent to [facility])   HPI Gina [name] presents for cough, wheezing, SOB MDI helped Gabapentin is helping LBP   Outpatient Medications Prior to Visit  Medication Sig Dispense Refill  . acetaminophen (TYLENOL) 500 MG tablet Take 1,000 mg by mouth daily. 4 tabs daily    . Budeson-Glycopyrrol-Formoterol (BREZTRI AEROSPHERE) 160-9-4.8 MCG/ACT AERO Inhale 2 puffs into the lungs at bedtime. 10 g 11  . Calcium Carbonate (CALCIUM 600 PO) Take 600 mg by mouth 2 (two) times daily.    . cetirizine (ZYRTEC) 10 MG tablet Take 10 mg by mouth 2 (two) times daily.     [name] ketoconazole (NIZORAL) 2 % cream as needed.    . Multiple Vitamin (MULTIVITAMIN WITH MINERALS) TABS tablet Take 1 tablet by mouth daily.    . Multiple Vitamins-Minerals (PRESERVISION AREDS 2 PO) Take 1 tablet by mouth daily.    [name] saccharomyces boulardii (FLORASTOR) 250 MG capsule Take 250 mg by mouth daily.    . vitamin C (ASCORBIC ACID) 500 MG tablet Take 500 mg by mouth every evening.    [name] allopurinol (ZYLOPRIM) 100 MG tablet Take 1 tablet (100 mg total) by mouth daily. 90 tablet 3  . amLODipine (NORVASC) 5 MG tablet Take 1 tablet (5 mg total) by mouth daily. 90 tablet 3  . atorvastatin (LIPITOR) 40 MG tablet Take 1 tablet (40 mg total) by mouth every evening. 90 tablet 3  . clopidogrel (PLAVIX) 75 MG tablet Take 1 tablet (75 mg total) by mouth daily. 90 tablet 3  . enalapril (VASOTEC) 10 MG tablet Take 1 tablet (10 mg total) by mouth daily. 90 tablet 3  . gabapentin (NEURONTIN) 400 MG capsule Take 1 capsule (400 mg total) by mouth 4 (four) times daily. 360 capsule 1  . triamterene-hydrochlorothiazide (MAXZIDE-25) 37.5-25 MG tablet Take 1 tablet by mouth daily. 90 tablet 3   No facility-administered medications prior to visit.    ROS: Review of  Systems  Objective:  BP (!) 142/70 (BP Location: Left Arm)   Pulse 78   Temp 98.3 F (36.8 C) (Oral)   Ht 5' (1.524 m)   Wt 189 lb 9.6 oz (86 kg)   SpO2 93%   BMI 37.03 kg/m   BP Readings from Last 3 Encounters:  05/06/20 (!) 142/70  02/04/20 132/70  12/05/19 (!) 150/70    Wt Readings from Last 3 Encounters:  05/06/20 189 lb 9.6 oz (86 kg)  02/04/20 190 lb 6.4 oz (86.4 kg)  12/13/19 190 lb (86.2 kg)    Physical Exam  Lab Results  Component Value Date   WBC 6.0 12/03/2019   HGB 13.4 12/03/2019   HCT 41.2 12/03/2019   PLT 190.0 12/03/2019   GLUCOSE 94 02/04/2020   CHOL 186 05/22/2019   TRIG 268.0 (H) 05/22/2019   HDL 57.80 05/22/2019   LDLDIRECT 80.0 05/22/2019   LDLCALC 42 02/23/2018   ALT 13 12/03/2019   AST 19 12/03/2019   NA 136 02/04/2020   K 4.3 02/04/2020   CL 97 02/04/2020   CREATININE 0.97 02/04/2020   BUN 25 (H) 02/04/2020   CO2 34 (H) 02/04/2020   TSH 2.85 05/22/2019   INR 0.96 07/24/2017   HGBA1C 5.6 05/22/2019    No results found.  Assessment & Plan:   Altha was seen today for follow-up.  Diagnoses and all orders for this visit:  Malignant neoplasm of colon, unspecified part of colon ([place]) -     clopidogrel (PLAVIX) 75 MG tablet; Take 1 tablet (75 mg total) by mouth daily.  Wheezing  Chronic midline low back pain with sciatica, sciatica laterality unspecified  Gout, unspecified cause, unspecified chronicity, unspecified site  Other orders -     allopurinol (ZYLOPRIM) 100 MG tablet; Take 1 tablet (100 mg total) by mouth daily. -     amLODipine (NORVASC) 5 MG tablet; Take 1 tablet (5 mg total) by mouth daily. -     atorvastatin (LIPITOR) 40 MG tablet; Take 1 tablet (40 mg total) by mouth every evening. -     gabapentin (NEURONTIN) 400 MG capsule; Take 1 capsule (400 mg total) by mouth 4 (four) times daily. -     losartan (COZAAR) 50 MG tablet; Take 1 tablet (50 mg total) by mouth daily. -     triamterene-hydrochlorothiazide  (MAXZIDE-25) 37.5-25 MG tablet; Take 1 tablet by mouth daily.     Meds ordered this encounter  Medications  . allopurinol (ZYLOPRIM) 100 MG tablet    Sig: Take 1 tablet (100 mg total) by mouth daily.    Dispense:  90 tablet    Refill:  3  . amLODipine (NORVASC) 5 MG tablet    Sig: Take 1 tablet (5 mg total) by mouth daily.    Dispense:  90 tablet    Refill:  3  . atorvastatin (LIPITOR) 40 MG tablet    Sig: Take 1 tablet (40 mg total) by mouth every evening.    Dispense:  90 tablet    Refill:  3  . clopidogrel (PLAVIX) 75 MG tablet    Sig: Take 1 tablet (75 mg total) by mouth daily.    Dispense:  90 tablet    Refill:  3  . gabapentin (NEURONTIN) 400 MG capsule    Sig: Take 1 capsule (400 mg total) by mouth 4 (four) times daily.    Dispense:  360 capsule    Refill:  3  . losartan (COZAAR) 50 MG tablet    Sig: Take 1 tablet (50 mg total) by mouth daily.    Dispense:  90 tablet    Refill:  3  . triamterene-hydrochlorothiazide (MAXZIDE-25) 37.5-25 MG tablet    Sig: Take 1 tablet by mouth daily.    Dispense:  90 tablet    Refill:  3     Follow-up: No follow-ups on file.  Walker Kehr, MD

## 2020-05-06 NOTE — Assessment & Plan Note (Signed)
No relapse 

## 2020-05-06 NOTE — Assessment & Plan Note (Addendum)
Asthmatic sx's Breztri 2 puffs at night is helping some D/c Enalapril

## 2020-05-07 ENCOUNTER — Other Ambulatory Visit: Payer: Self-pay | Admitting: Internal Medicine

## 2020-06-17 ENCOUNTER — Other Ambulatory Visit: Payer: Self-pay | Admitting: Internal Medicine

## 2020-06-17 ENCOUNTER — Other Ambulatory Visit: Payer: Self-pay

## 2020-06-17 ENCOUNTER — Ambulatory Visit (INDEPENDENT_AMBULATORY_CARE_PROVIDER_SITE_OTHER): Payer: Medicare Other | Admitting: Internal Medicine

## 2020-06-17 ENCOUNTER — Encounter: Payer: Self-pay | Admitting: Internal Medicine

## 2020-06-17 DIAGNOSIS — J3089 Other allergic rhinitis: Secondary | ICD-10-CM

## 2020-06-17 DIAGNOSIS — I10 Essential (primary) hypertension: Secondary | ICD-10-CM

## 2020-06-17 DIAGNOSIS — Z6836 Body mass index (BMI) 36.0-36.9, adult: Secondary | ICD-10-CM

## 2020-06-17 DIAGNOSIS — I700 Atherosclerosis of aorta: Secondary | ICD-10-CM | POA: Diagnosis not present

## 2020-06-17 DIAGNOSIS — R062 Wheezing: Secondary | ICD-10-CM | POA: Diagnosis not present

## 2020-06-17 MED ORDER — METHYLPREDNISOLONE ACETATE 80 MG/ML IJ SUSP
80.0000 mg | Freq: Once | INTRAMUSCULAR | Status: AC
  Administered 2020-06-17: 80 mg via INTRAMUSCULAR

## 2020-06-17 MED ORDER — BREZTRI AEROSPHERE 160-9-4.8 MCG/ACT IN AERO: 2.0000 | INHALATION_SPRAY | Freq: Every day | RESPIRATORY_TRACT | 3 refills | Status: DC

## 2020-06-17 NOTE — Assessment & Plan Note (Signed)
Cont w/wt loss 

## 2020-06-17 NOTE — Assessment & Plan Note (Signed)
On Zyrtec

## 2020-06-17 NOTE — Progress Notes (Signed)
Subjective:  Patient ID: Gina [name], female    DOB: 08/25/1931  Age: 87 y.o. MRN: [id number]  CC: Follow-up (6 week f/u)   HPI Gina [name] presents for SOB, cough and wheezing since Oct 2021 - better on Breztre by 50-60% F/u HTN, allergies. BP nl at home  Outpatient Medications Prior to Visit  Medication Sig Dispense Refill  . acetaminophen (TYLENOL) 500 MG tablet Take 1,000 mg by mouth daily. 4 tabs daily    . allopurinol (ZYLOPRIM) 100 MG tablet Take 1 tablet (100 mg total) by mouth daily. 90 tablet 3  . amLODipine (NORVASC) 5 MG tablet Take 1 tablet (5 mg total) by mouth daily. 90 tablet 3  . atorvastatin (LIPITOR) 40 MG tablet Take 1 tablet (40 mg total) by mouth every evening. 90 tablet 3  . Budeson-Glycopyrrol-Formoterol (BREZTRI AEROSPHERE) 160-9-4.8 MCG/ACT AERO Inhale 2 puffs into the lungs at bedtime. 10 g 11  . Calcium Carbonate (CALCIUM 600 PO) Take 600 mg by mouth 2 (two) times daily.    . cetirizine (ZYRTEC) 10 MG tablet Take 10 mg by mouth 2 (two) times daily.     . clopidogrel (PLAVIX) 75 MG tablet Take 1 tablet (75 mg total) by mouth daily. 90 tablet 3  . COZAAR 50 MG tablet TAKE 1 TABLET DAILY. 90 tablet 3  . gabapentin (NEURONTIN) 400 MG capsule Take 1 capsule (400 mg total) by mouth 4 (four) times daily. 360 capsule 3  . ketoconazole (NIZORAL) 2 % cream as needed.    . Multiple Vitamin (MULTIVITAMIN WITH MINERALS) TABS tablet Take 1 tablet by mouth daily.    . Multiple Vitamins-Minerals (PRESERVISION AREDS 2 PO) Take 1 tablet by mouth daily.    [name] saccharomyces boulardii (FLORASTOR) 250 MG capsule Take 250 mg by mouth daily.    [name] triamterene-hydrochlorothiazide (MAXZIDE-25) 37.5-25 MG tablet Take 1 tablet by mouth daily. 90 tablet 3  . vitamin C (ASCORBIC ACID) 500 MG tablet Take 500 mg by mouth every evening.     No facility-administered medications prior to visit.    ROS: Review of Systems  Constitutional: Negative for activity change, appetite  change, chills, fatigue and unexpected weight change.  HENT: Negative for congestion, mouth sores and sinus pressure.   Eyes: Negative for visual disturbance.  Respiratory: Positive for cough, shortness of breath and wheezing. Negative for chest tightness.   Gastrointestinal: Negative for abdominal pain and nausea.  Genitourinary: Negative for difficulty urinating, frequency and vaginal pain.  Musculoskeletal: Negative for back pain and gait problem.  Skin: Negative for pallor and rash.  Neurological: Negative for dizziness, tremors, weakness, numbness and headaches.  Psychiatric/Behavioral: Negative for confusion and sleep disturbance.    Objective:  BP (!) 148/70 (BP Location: Left Arm)   Pulse 91   Temp 98.4 F (36.9 C) (Oral)   Ht 5' (1.524 m)   Wt 186 lb 9.6 oz (84.6 kg)   SpO2 91%   BMI 36.44 kg/m   BP Readings from Last 3 Encounters:  06/17/20 (!) 148/70  05/06/20 (!) 142/70  02/04/20 132/70    Wt Readings from Last 3 Encounters:  06/17/20 186 lb 9.6 oz (84.6 kg)  05/06/20 189 lb 9.6 oz (86 kg)  02/04/20 190 lb 6.4 oz (86.4 kg)    Physical Exam Constitutional:      General: She is not in acute distress.    Appearance: She is well-developed. She is obese.  HENT:     Head: Normocephalic.     Right  Ear: External ear normal.     Left Ear: External ear normal.     Nose: Nose normal.  Eyes:     General:        Right eye: No discharge.        Left eye: No discharge.     Conjunctiva/sclera: Conjunctivae normal.     Pupils: Pupils are equal, round, and reactive to light.  Neck:     Thyroid: No thyromegaly.     Vascular: No JVD.     Trachea: No tracheal deviation.  Cardiovascular:     Rate and Rhythm: Normal rate and regular rhythm.     Heart sounds: Normal heart sounds.  Pulmonary:     Effort: No respiratory distress.     Breath sounds: No stridor. No wheezing.  Abdominal:     General: Bowel sounds are normal. There is no distension.     Palpations: Abdomen  is soft. There is no mass.     Tenderness: There is no abdominal tenderness. There is no guarding or rebound.  Musculoskeletal:        General: No tenderness.     Cervical back: Normal range of motion and neck supple.     Right lower leg: Edema present.     Left lower leg: No edema.  Lymphadenopathy:     Cervical: No cervical adenopathy.  Skin:    Findings: No erythema or rash.  Neurological:     Mental Status: She is oriented to person, place, and time.     Cranial Nerves: No cranial nerve deficit.     Motor: No abnormal muscle tone.     Coordination: Coordination normal.     Deep Tendon Reflexes: Reflexes normal.  Psychiatric:        Behavior: Behavior normal.        Thought Content: Thought content normal.        Judgment: Judgment normal.     Lab Results  Component Value Date   WBC 6.0 12/03/2019   HGB 13.4 12/03/2019   HCT 41.2 12/03/2019   PLT 190.0 12/03/2019   GLUCOSE 94 02/04/2020   CHOL 186 05/22/2019   TRIG 268.0 (H) 05/22/2019   HDL 57.80 05/22/2019   LDLDIRECT 80.0 05/22/2019   LDLCALC 42 02/23/2018   ALT 13 12/03/2019   AST 19 12/03/2019   NA 136 02/04/2020   K 4.3 02/04/2020   CL 97 02/04/2020   CREATININE 0.97 02/04/2020   BUN 25 (H) 02/04/2020   CO2 34 (H) 02/04/2020   TSH 2.85 05/22/2019   INR 0.96 07/24/2017   HGBA1C 5.6 05/22/2019    No results found.  Assessment & Plan:     Follow-up: No follow-ups on file.  Walker Kehr, MD

## 2020-06-17 NOTE — Addendum Note (Signed)
Addended by: Earnstine Regal on: 06/17/2020 11:52 AM   Modules accepted: Orders

## 2020-06-17 NOTE — Assessment & Plan Note (Signed)
Asthmatic sx's Breztri 2 puffs at night is helping  Better 60% Depo-medrol 80 mg IM

## 2020-06-17 NOTE — Assessment & Plan Note (Signed)
  Norvasc,  Triamt/HCTZ, Losartan BP is nl at home

## 2020-06-26 ENCOUNTER — Ambulatory Visit: Payer: Medicare Other

## 2020-06-28 DIAGNOSIS — I700 Atherosclerosis of aorta: Secondary | ICD-10-CM | POA: Insufficient documentation

## 2020-06-28 NOTE — Assessment & Plan Note (Signed)
On Lipitor 

## 2020-07-14 ENCOUNTER — Ambulatory Visit (INDEPENDENT_AMBULATORY_CARE_PROVIDER_SITE_OTHER): Payer: Medicare Other

## 2020-07-14 ENCOUNTER — Other Ambulatory Visit: Payer: Self-pay

## 2020-07-14 DIAGNOSIS — Z0000 Encounter for general adult medical examination without abnormal findings: Secondary | ICD-10-CM

## 2020-07-14 NOTE — Patient Instructions (Signed)
Gina [name] , Thank you for taking time to come for your Medicare Wellness Visit. I appreciate your ongoing commitment to your health goals. Please review the following plan we discussed and let me know if I can assist you in the future.   Screening recommendations/referrals: Colonoscopy: no repeat due to age 87: 11/07/2019 Bone Density: 10/16/2018 Recommended yearly ophthalmology/optometry visit for glaucoma screening and checkup Recommended yearly dental visit for hygiene and checkup  Vaccinations: Influenza vaccine: 11/20/2019 Pneumococcal vaccine: 09/06/2013, 08/10/2015 Tdap vaccine: 08/15/2016 Shingles vaccine: never done   Covid-19: 03/30/2019, 04/24/2019, 12/06/2019  Advanced directives: Please bring a copy of your health care power of attorney and living will to the office at your convenience.  Conditions/risks identified:  Yes; Reviewed health maintenance screenings with patient today and relevant education, vaccines, and/or referrals were provided. Please continue to do your personal lifestyle choices by: daily care of teeth and gums, regular physical activity (goal should be 5 days a week for 30 minutes), eat a healthy diet, avoid tobacco and drug use, limiting any alcohol intake, taking a low-dose aspirin (if not allergic or have been advised by your provider otherwise) and taking vitamins and minerals as recommended by your provider. Continue doing brain stimulating activities (puzzles, reading, adult coloring books, staying active) to keep memory sharp. Continue to eat heart healthy diet (full of fruits, vegetables, whole grains, lean protein, water--limit salt, fat, and sugar intake) and increase physical activity as tolerated.  Next appointment: Please schedule your next Medicare Wellness Visit with your Nurse Health Advisor in 1 year by calling [phone].  Preventive Care 47 Years and Older, Female Preventive care refers to lifestyle choices and visits with your health care  provider that can promote health and wellness. What does preventive care include?  A yearly physical exam. This is also called an annual well check.  Dental exams once or twice a year.  Routine eye exams. Ask your health care provider how often you should have your eyes checked.  Personal lifestyle choices, including:  Daily care of your teeth and gums.  Regular physical activity.  Eating a healthy diet.  Avoiding tobacco and drug use.  Limiting alcohol use.  Practicing safe sex.  Taking low-dose aspirin every day.  Taking vitamin and mineral supplements as recommended by your health care provider. What happens during an annual well check? The services and screenings done by your health care provider during your annual well check will depend on your age, overall health, lifestyle risk factors, and family history of disease. Counseling  Your health care provider may ask you questions about your:  Alcohol use.  Tobacco use.  Drug use.  Emotional well-being.  Home and relationship well-being.  Sexual activity.  Eating habits.  History of falls.  Memory and ability to understand (cognition).  Work and work Statistician.  Reproductive health. Screening  You may have the following tests or measurements:  Height, weight, and BMI.  Blood pressure.  Lipid and cholesterol levels. These may be checked every 5 years, or more frequently if you are over 63 years old.  Skin check.  Lung cancer screening. You may have this screening every year starting at age 40 if you have a 30-pack-year history of smoking and currently smoke or have quit within the past 15 years.  Fecal occult blood test (FOBT) of the stool. You may have this test every year starting at age 30.  Flexible sigmoidoscopy or colonoscopy. You may have a sigmoidoscopy every 5 years or a colonoscopy every  10 years starting at age 24.  Hepatitis C blood test.  Hepatitis B blood test.  Sexually  transmitted disease (STD) testing.  Diabetes screening. This is done by checking your blood sugar (glucose) after you have not eaten for a while (fasting). You may have this done every 1-3 years.  Bone density scan. This is done to screen for osteoporosis. You may have this done starting at age 77.  Mammogram. This may be done every 1-2 years. Talk to your health care provider about how often you should have regular mammograms. Talk with your health care provider about your test results, treatment options, and if necessary, the need for more tests. Vaccines  Your health care provider may recommend certain vaccines, such as:  Influenza vaccine. This is recommended every year.  Tetanus, diphtheria, and acellular pertussis (Tdap, Td) vaccine. You may need a Td booster every 10 years.  Zoster vaccine. You may need this after age 60.  Pneumococcal 13-valent conjugate (PCV13) vaccine. One dose is recommended after age 85.  Pneumococcal polysaccharide (PPSV23) vaccine. One dose is recommended after age 70. Talk to your health care provider about which screenings and vaccines you need and how often you need them. This information is not intended to replace advice given to you by your health care provider. Make sure you discuss any questions you have with your health care provider. Document Released: 02/27/2015 Document Revised: 10/21/2015 Document Reviewed: 12/02/2014 Elsevier Interactive Patient Education  2017 [place] Prevention in the Home Falls can cause injuries. They can happen to people of all ages. There are many things you can do to make your home safe and to help prevent falls. What can I do on the outside of my home?  Regularly fix the edges of walkways and driveways and fix any cracks.  Remove anything that might make you trip as you walk through a door, such as a raised step or threshold.  Trim any bushes or trees on the path to your home.  Use bright outdoor  lighting.  Clear any walking paths of anything that might make someone trip, such as rocks or tools.  Regularly check to see if handrails are loose or broken. Make sure that both sides of any steps have handrails.  Any raised decks and porches should have guardrails on the edges.  Have any leaves, snow, or ice cleared regularly.  Use sand or salt on walking paths during winter.  Clean up any spills in your garage right away. This includes oil or grease spills. What can I do in the bathroom?  Use night lights.  Install grab bars by the toilet and in the tub and shower. Do not use towel bars as grab bars.  Use non-skid mats or decals in the tub or shower.  If you need to sit down in the shower, use a plastic, non-slip stool.  Keep the floor dry. Clean up any water that spills on the floor as soon as it happens.  Remove soap buildup in the tub or shower regularly.  Attach bath mats securely with double-sided non-slip rug tape.  Do not have throw rugs and other things on the floor that can make you trip. What can I do in the bedroom?  Use night lights.  Make sure that you have a light by your bed that is easy to reach.  Do not use any sheets or blankets that are too big for your bed. They should not hang down onto the floor.  Have a firm chair that has side arms. You can use this for support while you get dressed.  Do not have throw rugs and other things on the floor that can make you trip. What can I do in the kitchen?  Clean up any spills right away.  Avoid walking on wet floors.  Keep items that you use a lot in easy-to-reach places.  If you need to reach something above you, use a strong step stool that has a grab bar.  Keep electrical cords out of the way.  Do not use floor polish or wax that makes floors slippery. If you must use wax, use non-skid floor wax.  Do not have throw rugs and other things on the floor that can make you trip. What can I do with my  stairs?  Do not leave any items on the stairs.  Make sure that there are handrails on both sides of the stairs and use them. Fix handrails that are broken or loose. Make sure that handrails are as long as the stairways.  Check any carpeting to make sure that it is firmly attached to the stairs. Fix any carpet that is loose or worn.  Avoid having throw rugs at the top or bottom of the stairs. If you do have throw rugs, attach them to the floor with carpet tape.  Make sure that you have a light switch at the top of the stairs and the bottom of the stairs. If you do not have them, ask someone to add them for you. What else can I do to help prevent falls?  Wear shoes that:  Do not have high heels.  Have rubber bottoms.  Are comfortable and fit you well.  Are closed at the toe. Do not wear sandals.  If you use a stepladder:  Make sure that it is fully opened. Do not climb a closed stepladder.  Make sure that both sides of the stepladder are locked into place.  Ask someone to hold it for you, if possible.  Clearly mark and make sure that you can see:  Any grab bars or handrails.  First and last steps.  Where the edge of each step is.  Use tools that help you move around (mobility aids) if they are needed. These include:  Canes.  Walkers.  Scooters.  Crutches.  Turn on the lights when you go into a dark area. Replace any light bulbs as soon as they burn out.  Set up your furniture so you have a clear path. Avoid moving your furniture around.  If any of your floors are uneven, fix them.  If there are any pets around you, be aware of where they are.  Review your medicines with your doctor. Some medicines can make you feel dizzy. This can increase your chance of falling. Ask your doctor what other things that you can do to help prevent falls. This information is not intended to replace advice given to you by your health care provider. Make sure you discuss any  questions you have with your health care provider. Document Released: 11/27/2008 Document Revised: 07/09/2015 Document Reviewed: 03/07/2014 Elsevier Interactive Patient Education  2017 [employer].

## 2020-07-14 NOTE — Progress Notes (Addendum)
I connected with [facility] today by telephone and verified that I am speaking with the correct person using two identifiers. Location patient: home Location provider: work Persons participating in the virtual visit: Shalan Neault and [place], [place].   I discussed the limitations, risks, security and privacy concerns of performing an evaluation and management service by telephone and the availability of in person appointments. I also discussed with the patient that there may be a patient responsible charge related to this service. The patient expressed understanding and verbally consented to this telephonic visit.    Interactive audio and video telecommunications were attempted between this provider and patient, however failed, due to patient having technical difficulties OR patient did not have access to video capability.  We continued and completed visit with audio only.  Some vital signs may be absent or patient reported.   Time Spent with patient on telephone encounter: 30 minutes  Subjective:   Gina [name] is a 87 y.o. female who presents for Medicare Annual (Subsequent) preventive examination.  Review of Systems    No ROS. Medicare Wellness Visit. Additional risk factors are reflected in social history. Cardiac Risk Factors include: advanced age (>84men, >64 women);dyslipidemia;family history of premature cardiovascular disease;hypertension     Objective:    There were no vitals filed for this visit. There is no height or weight on file to calculate BMI.  Advanced Directives 07/14/2020 02/22/2018 10/31/2017 08/03/2017 08/02/2017 07/24/2017 06/16/2017  Does Patient Have a Medical Advance Directive? Yes No Yes Yes Yes Yes Yes  Type of Advance Directive Living will;Healthcare Power of [place];Living will Living will [place];Living will - [place];Living will  Does patient want to make changes to  medical advance directive? No - Patient declined - - No - Patient declined - - -  Copy of [place] in Chart? No - copy requested - - - No - copy requested - Yes  Would patient like information on creating a medical advance directive? - No - Patient declined - - - - -  Pre-existing out of facility DNR order (yellow form or pink MOST form) - - - - - - -    Current Medications (verified) Outpatient Encounter Medications as of 07/14/2020  Medication Sig   acetaminophen (TYLENOL) 500 MG tablet Take 1,000 mg by mouth daily. 4 tabs daily   allopurinol (ZYLOPRIM) 100 MG tablet Take 1 tablet (100 mg total) by mouth daily.   amLODipine (NORVASC) 5 MG tablet Take 1 tablet (5 mg total) by mouth daily.   atorvastatin (LIPITOR) 40 MG tablet Take 1 tablet (40 mg total) by mouth every evening.   BREZTRI AEROSPHERE 160-9-4.8 MCG/ACT AERO INHALE 2 PUFFS INTO THE LUNGS AT BEDTIME. (Patient not taking: Reported on 07/14/2020)   Calcium Carbonate (CALCIUM 600 PO) Take 600 mg by mouth 2 (two) times daily.   cetirizine (ZYRTEC) 10 MG tablet Take 10 mg by mouth 2 (two) times daily.    clopidogrel (PLAVIX) 75 MG tablet Take 1 tablet (75 mg total) by mouth daily.   COZAAR 50 MG tablet TAKE 1 TABLET DAILY.   gabapentin (NEURONTIN) 400 MG capsule Take 1 capsule (400 mg total) by mouth 4 (four) times daily.   ketoconazole (NIZORAL) 2 % cream as needed.   Multiple Vitamin (MULTIVITAMIN WITH MINERALS) TABS tablet Take 1 tablet by mouth daily.   Multiple Vitamins-Minerals (PRESERVISION AREDS 2 PO) Take 1 tablet by mouth daily.  saccharomyces boulardii (FLORASTOR) 250 MG capsule Take 250 mg by mouth daily.   triamterene-hydrochlorothiazide (MAXZIDE-25) 37.5-25 MG tablet Take 1 tablet by mouth daily.   vitamin C (ASCORBIC ACID) 500 MG tablet Take 500 mg by mouth every evening.   No facility-administered encounter medications on file as of 07/14/2020.    Allergies (verified) Penicillins, Aspirin,  Enalapril, and Adhesive [tape]   History: Past Medical History:  Diagnosis Date   Allergic rhinitis    seasonal   Arthritis    Colon cancer [facility]) Dec 12   Diverticulosis of colon (without mention of hemorrhage)    GI bleed    had cyst removed from spine....went back on plavix to soon   Gout    Hyperlipidemia    Hyperplastic colon polyp    Hypertension    Macular degeneration    PAD (peripheral artery disease) ([place])    Pneumonia    back in 05/2017   Stroke [facility])    TIA  10/2009   Unspecified transient cerebral ischemia sept '11   Past Surgical History:  Procedure Laterality Date   ABDOMINAL AORTOGRAM N/A 06/16/2017   Procedure: ABDOMINAL AORTOGRAM;  Surgeon: Angelia Mould, MD;  Location: [place] [facility];  Service: Cardiovascular;  Laterality: N/A;   APPENDECTOMY     BACK SURGERY  April '12   benign cyst spine ([place])   BREAST LUMPECTOMY     right breast '86   COLON SURGERY  Dec '12   colectomy for colon cancer ([place])   ENDARTERECTOMY FEMORAL Bilateral 08/02/2017   ENDARTERECTOMY FEMORAL Bilateral 08/02/2017   Procedure: Bilateral Femoral ENDARTERECTOMY with Patch Angioplasty;  Surgeon: Rosetta Posner, MD;  Location: [place];  Service: Vascular;  Laterality: Bilateral;   EYE SURGERY     bilateral cataracts   LOWER EXTREMITY ANGIOGRAPHY N/A 06/16/2017   Procedure: LOWER EXTREMITY ANGIOGRAPHY;  Surgeon: Angelia Mould, MD;  Location: [place] [facility];  Service: Cardiovascular;  Laterality: N/A;   PERIPHERAL VASCULAR INTERVENTION  06/16/2017   Procedure: PERIPHERAL VASCULAR INTERVENTION;  Surgeon: Angelia Mould, MD;  Location: [place] [facility];  Service: Cardiovascular;;  left common iliac   TONSILLECTOMY     Family History  Problem Relation Age of Onset   Colon cancer Father    Hypertension Father    Hyperlipidemia Father    Coronary artery disease Father    Ulcers Father    Heart failure Mother    Coronary artery disease Mother    Breast  cancer Mother    Hypertension Mother    Hyperlipidemia Mother    Asthma Mother    Diabetes Neg Hx    Social History   Socioeconomic History   Marital status: Widowed    Spouse name: Not on file   Number of children: 3   Years of education: 14   Highest education level: Not on file  Occupational History   Occupation: Tree surgeon: RETIRED    Comment: retired  Tobacco Use   Smoking status: Former Smoker    Packs/day: 1.50    Years: 30.00    Pack years: 45.00    Types: Cigarettes    Quit date: 07/21/1987    Years since quitting: 33.0   Smokeless tobacco: Never Used  Vaping Use   Vaping Use: Never used  Substance and Sexual Activity   Alcohol use: Yes    Alcohol/week: 14.0 standard drinks    Types: 14 Glasses of wine per week    Comment: Wine  daily   Drug use: No   Sexual activity: Not Currently  Other Topics Concern   Not on file  Social History Narrative   HSG, Married 1955 widowed 2010. 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 Passed away 2008/07/10 Her son  moved to [place] (July '10). End of life care:  no extra ordinary measures: wants cardiac resusitation and mechanical ventilation short-term if needed. Does not want to be kept in a persistant vegative state or long term artifical life support.    Social Determinants of Health   Financial Resource Strain: Low Risk    Difficulty of Paying Living Expenses: Not hard at all  Food Insecurity: No Food Insecurity   Worried About Charity fundraiser in the Last Year: Never true   [place] in the Last Year: Never true  Transportation Needs: No Transportation Needs   Lack of Transportation (Medical): No   Lack of Transportation (Non-Medical): No  Physical Activity: Sufficiently Active   Days of Exercise per Week: 5 days   Minutes of Exercise  per Session: 30 min  Stress: No Stress Concern Present   Feeling of Stress : Not at all  Social Connections: Moderately Integrated   Frequency of Communication with Friends and Family: More than three times a week   Frequency of Social Gatherings with Friends and Family: More than three times a week   Attends Religious Services: More than 4 times per year   Active Member of [employer] or Organizations: Yes   Attends Archivist Meetings: More than 4 times per year   Marital Status: Widowed    Tobacco Counseling Counseling given: Not Answered   Clinical Intake:  Pre-visit preparation completed: Yes  Pain : No/denies pain     Diabetes: No  How often do you need to have someone help you when you read instructions, pamphlets, or other written materials from your doctor or pharmacy?: 1 - Never What is the last grade level you completed in school?: HSG  Diabetic? no  Interpreter Needed?: No  Information entered by :: Lisette Abu, LPN   Activities of Daily Living In your present state of health, do you have any difficulty performing the following activities: 07/14/2020  Hearing? N  Vision? N  Difficulty concentrating or making decisions? N  Walking or climbing stairs? N  Dressing or bathing? N  Doing errands, shopping? N  Preparing Food and eating ? N  Using the Toilet? N  In the past six months, have you accidently leaked urine? Y  Comment wear a protective pad  Do you have problems with loss of bowel control? N  Managing your Medications? N  Managing your Finances? N  Housekeeping or managing your Housekeeping? N  Some recent data might be hidden    Patient Care Team: Plotnikov, Evie Lacks, MD as PCP - General (Internal Medicine) Vickie Epley, MD as PCP - Electrophysiology (Cardiology) Donnie Mesa, MD (General Surgery) Lafayette Dragon, MD (Inactive) (Gastroenterology) Clent Jacks, MD (Ophthalmology) Zadie Rhine Clent Demark, MD (Ophthalmology) Jovita Gamma, MD (Neurosurgery) Allyn Kenner, MD (Dermatology) Brien Few, MD (Obstetrics and Gynecology) Dorna Leitz, MD (Orthopedic Surgery) Wyatt Portela, MD as Consulting Physician (Hematology and Oncology)  Indicate any recent Medical Services you may have received from other than [facility] providers in the past year (date may be approximate).  Assessment:   This is a routine wellness examination for [place].  Hearing/Vision screen No exam data present  Dietary issues and exercise activities discussed: Current Exercise Habits: Home exercise routine, Type of exercise: walking, Time (Minutes): 30, Frequency (Times/Week): 5, Weekly Exercise (Minutes/Week): 150, Intensity: Mild, Exercise limited by: orthopedic condition(s);cardiac condition(s)  Goals Addressed               This Visit's Progress     Patient Stated (pt-stated)        Stay as healthy as possible.       Depression Screen PHQ 2/9 Scores 07/14/2020 10/01/2019 10/01/2018 09/04/2017 08/15/2016 08/10/2015  PHQ - 2 Score 0 0 0 0 0 0    Fall Risk Fall Risk  07/14/2020 07/14/2020 10/01/2019 10/01/2018 09/04/2017  Falls in the past year? 0 0 1 0 No  Number falls in past yr: 0 0 0 0 -  Injury with Fall? 0 0 0 0 -  Risk for fall due to : No Fall Risks No Fall Risks - - -  Follow up Falls evaluation completed Falls evaluation completed - - -    FALL RISK PREVENTION PERTAINING TO THE HOME:  Any stairs in or around the home? Yes  If so, are there any without handrails? No  Home free of loose throw rugs in walkways, pet beds, electrical cords, etc? Yes  Adequate lighting in your home to reduce risk of falls? Yes   ASSISTIVE DEVICES UTILIZED TO PREVENT FALLS:  Life alert? Yes  Use of a cane, walker or w/c? Yes  Grab bars in the bathroom? Yes  Shower chair or bench in shower? Yes  Elevated toilet seat or a handicapped toilet? Yes   TIMED UP AND GO:  Was the test performed? No .  Length of time to ambulate 10 feet: 0 sec.    Gait steady and fast without use of assistive device (uses walker at night only)  Cognitive Function: Normal cognitive status assessed by direct observation by this Nurse Health Advisor. No abnormalities found.          Immunizations Immunization History  Administered Date(s) Administered   Fluad Quad(high Dose 65+) 11/20/2019   Influenza Split 11/22/2010, 12/06/2011   Influenza Whole 11/09/2007, 11/25/2008, 11/14/2009   Influenza, High Dose Seasonal PF 11/23/2015, 11/20/2017, 11/28/2018   Influenza,inj,Quad PF,6+ Mos 11/07/2012, 11/09/2013, 11/07/2016   Influenza-Unspecified 11/15/2014   PFIZER(Purple Top)SARS-COV-2 Vaccination 03/30/2019, 04/24/2019, 12/06/2019   Pneumococcal Conjugate-13 09/06/2013   Pneumococcal Polysaccharide-23 12/15/2001, 08/10/2015   Td 01/12/2006   Tdap 08/15/2016   Zoster, Live 01/12/2006    TDAP status: Up to date  Flu Vaccine status: Up to date  Pneumococcal vaccine status: Up to date  Covid-19 vaccine status: Completed vaccines  Qualifies for Shingles Vaccine? Yes   Zostavax completed Yes   Shingrix Completed?: No.    Education has been provided regarding the importance of this vaccine. Patient has been advised to call insurance company to determine out of pocket expense if they have not yet received this vaccine. Advised may also receive vaccine at local pharmacy or Health Dept. Verbalized acceptance and understanding.  Screening Tests Health Maintenance  Topic Date Due   Zoster Vaccines- Shingrix (1 of 2) Never done   COVID-19 Vaccine (4 - Booster for Pfizer series) 03/07/2020   COLONOSCOPY (Pts 45-78yrs Insurance coverage will need to be confirmed)  07/14/2021 (Originally 12/06/2019)   INFLUENZA VACCINE  09/14/2020   TETANUS/TDAP  08/16/2026   DEXA SCAN  Completed   PNA  vac Low Risk Adult  Completed   HPV VACCINES  Aged Out    Health Maintenance  Health Maintenance Due  Topic Date Due   Zoster Vaccines- Shingrix (1 of 2) Never  done   COVID-19 Vaccine (4 - Booster for Pfizer series) 03/07/2020    Colorectal cancer screening: No longer required.   Mammogram status: Completed 11/07/2019. Repeat every year  Bone Density status: Completed 10/16/2018. Results reflect: Bone density results: OSTEOPENIA. Repeat every 2 years.  (completed or no longer recommended)  Lung Cancer Screening: (Low Dose CT Chest recommended if Age 54-80 years, 30 pack-year currently smoking OR have quit w/in 15years.) does not qualify.   Lung Cancer Screening Referral: no  Additional Screening:  Hepatitis C Screening: does not qualify; Completed no  Vision Screening: Recommended annual ophthalmology exams for early detection of glaucoma and other disorders of the eye. Is the patient up to date with their annual eye exam?  Yes  Who is the provider or what is the name of the office in which the patient attends annual eye exams? Clent Jacks, MD and Deloria Lair, MD. If pt is not established with a provider, would they like to be referred to a provider to establish care? No .   Dental Screening: Recommended annual dental exams for proper oral hygiene  Community Resource Referral / Chronic Care Management: CRR required this visit?  No   CCM required this visit?  No      Plan:     I have personally reviewed and noted the following in the patient's chart:   Medical and social history Use of alcohol, tobacco or illicit drugs  Current medications and supplements including opioid prescriptions.  Functional ability and status Nutritional status Physical activity Advanced directives List of other physicians Hospitalizations, surgeries, and ER visits in previous 12 months Vitals Screenings to include cognitive, depression, and falls Referrals and appointments  In addition, I have reviewed and discussed with patient certain preventive protocols, quality metrics, and best practice recommendations. A written personalized care plan for  preventive services as well as general preventive health recommendations were provided to patient.     Sheral Flow, LPN   6/60/[id number]   Nurse Notes:  Patient is cogitatively intact. There were no vitals filed for this visit. There is no height or weight on file to calculate BMI. Patient stated that she has no issues with gait or balance; does use a walker at night in the house. Medications reviewed with patient; no opioid use noted.  Medical screening examination/treatment/procedure(s) were performed by non-physician practitioner and as supervising physician I was immediately available for consultation/collaboration.  I agree with above. Lew Dawes, MD

## 2020-09-17 ENCOUNTER — Ambulatory Visit: Payer: Medicare Other | Admitting: Internal Medicine

## 2020-09-22 ENCOUNTER — Encounter (INDEPENDENT_AMBULATORY_CARE_PROVIDER_SITE_OTHER): Payer: Medicare Other | Admitting: Ophthalmology

## 2020-10-01 ENCOUNTER — Ambulatory Visit (INDEPENDENT_AMBULATORY_CARE_PROVIDER_SITE_OTHER): Payer: Medicare Other | Admitting: Ophthalmology

## 2020-10-01 ENCOUNTER — Ambulatory Visit (INDEPENDENT_AMBULATORY_CARE_PROVIDER_SITE_OTHER): Payer: Medicare Other | Admitting: Internal Medicine

## 2020-10-01 ENCOUNTER — Ambulatory Visit (INDEPENDENT_AMBULATORY_CARE_PROVIDER_SITE_OTHER): Payer: Medicare Other

## 2020-10-01 ENCOUNTER — Encounter (INDEPENDENT_AMBULATORY_CARE_PROVIDER_SITE_OTHER): Payer: Self-pay | Admitting: Ophthalmology

## 2020-10-01 ENCOUNTER — Encounter: Payer: Self-pay | Admitting: Internal Medicine

## 2020-10-01 ENCOUNTER — Other Ambulatory Visit: Payer: Self-pay

## 2020-10-01 VITALS — BP 166/70 | HR 85 | Temp 98.0°F | Ht 60.0 in | Wt 191.0 lb

## 2020-10-01 DIAGNOSIS — H353134 Nonexudative age-related macular degeneration, bilateral, advanced atrophic with subfoveal involvement: Secondary | ICD-10-CM | POA: Diagnosis not present

## 2020-10-01 DIAGNOSIS — R062 Wheezing: Secondary | ICD-10-CM

## 2020-10-01 DIAGNOSIS — J90 Pleural effusion, not elsewhere classified: Secondary | ICD-10-CM | POA: Diagnosis not present

## 2020-10-01 DIAGNOSIS — J3089 Other allergic rhinitis: Secondary | ICD-10-CM | POA: Diagnosis not present

## 2020-10-01 DIAGNOSIS — J811 Chronic pulmonary edema: Secondary | ICD-10-CM | POA: Diagnosis not present

## 2020-10-01 DIAGNOSIS — H35021 Exudative retinopathy, right eye: Secondary | ICD-10-CM | POA: Diagnosis not present

## 2020-10-01 DIAGNOSIS — R059 Cough, unspecified: Secondary | ICD-10-CM | POA: Diagnosis not present

## 2020-10-01 DIAGNOSIS — R270 Ataxia, unspecified: Secondary | ICD-10-CM | POA: Diagnosis not present

## 2020-10-01 DIAGNOSIS — I517 Cardiomegaly: Secondary | ICD-10-CM | POA: Diagnosis not present

## 2020-10-01 DIAGNOSIS — C189 Malignant neoplasm of colon, unspecified: Secondary | ICD-10-CM

## 2020-10-01 DIAGNOSIS — H3561 Retinal hemorrhage, right eye: Secondary | ICD-10-CM | POA: Diagnosis not present

## 2020-10-01 DIAGNOSIS — T148XXA Other injury of unspecified body region, initial encounter: Secondary | ICD-10-CM

## 2020-10-01 LAB — COMPREHENSIVE METABOLIC PANEL
ALT: 45 U/L — ABNORMAL HIGH (ref 0–35)
AST: 35 U/L (ref 0–37)
Albumin: 4.2 g/dL (ref 3.5–5.2)
Alkaline Phosphatase: 68 U/L (ref 39–117)
BUN: 19 mg/dL (ref 6–23)
CO2: 36 mEq/L — ABNORMAL HIGH (ref 19–32)
Calcium: 10.4 mg/dL (ref 8.4–10.5)
Chloride: 93 mEq/L — ABNORMAL LOW (ref 96–112)
Creatinine, Ser: 0.93 mg/dL (ref 0.40–1.20)
GFR: 55.36 mL/min — ABNORMAL LOW (ref >60.00)
Glucose, Bld: 96 mg/dL (ref 70–99)
Potassium: 4 mEq/L (ref 3.5–5.1)
Sodium: 135 mEq/L (ref 135–145)
Total Bilirubin: 1.1 mg/dL (ref 0.2–1.2)
Total Protein: 7.4 g/dL (ref 6.0–8.3)

## 2020-10-01 LAB — CBC WITH DIFFERENTIAL/PLATELET
Basophils Absolute: 0 10*3/uL (ref 0.0–0.1)
Basophils Relative: 0.9 % (ref 0.0–3.0)
Eosinophils Absolute: 0.1 10*3/uL (ref 0.0–0.7)
Eosinophils Relative: 1.4 % (ref 0.0–5.0)
HCT: 50 % — ABNORMAL HIGH (ref 36.0–46.0)
Hemoglobin: 16.3 g/dL — ABNORMAL HIGH (ref 12.0–15.0)
Lymphocytes Relative: 23.9 % (ref 12.0–46.0)
Lymphs Abs: 1.1 10*3/uL (ref 0.7–4.0)
MCHC: 32.7 g/dL (ref 30.0–36.0)
MCV: 105.6 fl — ABNORMAL HIGH (ref 78.0–100.0)
Monocytes Absolute: 0.6 10*3/uL (ref 0.1–1.0)
Monocytes Relative: 13.9 % — ABNORMAL HIGH (ref 3.0–12.0)
Neutro Abs: 2.6 10*3/uL (ref 1.4–7.7)
Neutrophils Relative %: 59.9 % (ref 43.0–77.0)
Platelets: 226 10*3/uL (ref 150.0–400.0)
RBC: 4.73 Mil/uL (ref 3.87–5.11)
RDW: 15.6 % — ABNORMAL HIGH (ref 11.5–15.5)
WBC: 4.4 10*3/uL (ref 4.0–10.5)

## 2020-10-01 LAB — TSH: TSH: 2.11 u[IU]/mL (ref 0.35–5.50)

## 2020-10-01 MED ORDER — ALLOPURINOL 100 MG PO TABS: 100.0000 mg | ORAL_TABLET | Freq: Every day | ORAL | 3 refills | Status: DC

## 2020-10-01 MED ORDER — FUROSEMIDE 40 MG PO TABS: 40.0000 mg | ORAL_TABLET | Freq: Every day | ORAL | 5 refills | Status: DC | PRN

## 2020-10-01 MED ORDER — GABAPENTIN 400 MG PO CAPS: 400.0000 mg | ORAL_CAPSULE | Freq: Four times a day (QID) | ORAL | 3 refills | Status: DC

## 2020-10-01 MED ORDER — AMLODIPINE BESYLATE 5 MG PO TABS: 5.0000 mg | ORAL_TABLET | Freq: Every day | ORAL | 3 refills | Status: DC

## 2020-10-01 MED ORDER — TRIAMTERENE-HCTZ 37.5-25 MG PO TABS: 1.0000 | ORAL_TABLET | Freq: Every day | ORAL | 3 refills | Status: DC

## 2020-10-01 MED ORDER — ATORVASTATIN CALCIUM 40 MG PO TABS: 40.0000 mg | ORAL_TABLET | Freq: Every evening | ORAL | 3 refills | Status: AC

## 2020-10-01 MED ORDER — CETIRIZINE HCL 10 MG PO TABS: 10.0000 mg | ORAL_TABLET | Freq: Two times a day (BID) | ORAL | 3 refills | Status: AC

## 2020-10-01 MED ORDER — CLOPIDOGREL BISULFATE 75 MG PO TABS: 75.0000 mg | ORAL_TABLET | Freq: Every day | ORAL | 3 refills | Status: DC

## 2020-10-01 MED ORDER — TRELEGY ELLIPTA 100-62.5-25 MCG/INH IN AEPB: 1.0000 | INHALATION_SPRAY | Freq: Every day | RESPIRATORY_TRACT | 11 refills | Status: DC

## 2020-10-01 MED ORDER — LOSARTAN POTASSIUM 50 MG PO TABS: 50.0000 mg | ORAL_TABLET | Freq: Every day | ORAL | 3 refills | Status: DC

## 2020-10-01 MED ORDER — MOMETASONE FUROATE 50 MCG/ACT NA SUSP: 2.0000 | Freq: Every day | NASAL | 5 refills | Status: DC

## 2020-10-01 NOTE — Assessment & Plan Note (Signed)
Nasonex Zyrtec

## 2020-10-01 NOTE — Assessment & Plan Note (Addendum)
PECHR Peripheral exudation with significant hemorrhagic changes.  Goal today will be to laser demarcate this region so as to prevent spread towards the posterior pole and more over to prevent massive enlargement and diminishment of peripheral vision in this right eye

## 2020-10-01 NOTE — Progress Notes (Signed)
10/01/2020     CHIEF COMPLAINT Patient presents for Retina Follow Up   HISTORY OF PRESENT ILLNESS: Gina [name] is a 87 y.o. female who presents to the clinic today for:   HPI     Retina Follow Up           Diagnosis: Dry AMD   Laterality: both eyes   Onset: 1 year ago   Severity: severe   Duration: 1 year         Comments   1 year f/u OU with OCT OU  Pt c/o worsened vision in both eyes since previous visit. Pt also c/o having a new dark spot in the central vision of the right eye, first noticed 1 day ago. Pt denies any new floaters or flashes of light. Pt c/o seeing an image of a cat that is not truly present. Pt denies any dark curtain/veil.      Last edited by Reather Littler, COA on 10/01/2020  2:22 PM.      Referring physician: Cassandria Anger, MD [place],  [place] [address]  HISTORICAL INFORMATION:   Selected notes from the MEDICAL RECORD NUMBER    Lab Results  Component Value Date   HGBA1C 5.6 05/22/2019     CURRENT MEDICATIONS: No current outpatient medications on file. (Ophthalmic Drugs)   No current facility-administered medications for this visit. (Ophthalmic Drugs)   Current Outpatient Medications (Other)  Medication Sig   acetaminophen (TYLENOL) 500 MG tablet Take 1,000 mg by mouth daily. 4 tabs daily   allopurinol (ZYLOPRIM) 100 MG tablet Take 1 tablet (100 mg total) by mouth daily.   amLODipine (NORVASC) 5 MG tablet Take 1 tablet (5 mg total) by mouth daily.   atorvastatin (LIPITOR) 40 MG tablet Take 1 tablet (40 mg total) by mouth every evening.   Calcium Carbonate (CALCIUM 600 PO) Take 600 mg by mouth 2 (two) times daily.   cetirizine (ZYRTEC) 10 MG tablet Take 1 tablet (10 mg total) by mouth 2 (two) times daily.   clopidogrel (PLAVIX) 75 MG tablet Take 1 tablet (75 mg total) by mouth daily.   Fluticasone-Umeclidin-Vilant (TRELEGY ELLIPTA) 100-62.5-25 MCG/INH AEPB Inhale 1 puff into the lungs daily.    furosemide (LASIX) 40 MG tablet Take 1 tablet (40 mg total) by mouth daily as needed.   gabapentin (NEURONTIN) 400 MG capsule Take 1 capsule (400 mg total) by mouth 4 (four) times daily.   ketoconazole (NIZORAL) 2 % cream as needed.   losartan (COZAAR) 50 MG tablet Take 1 tablet (50 mg total) by mouth daily.   mometasone (NASONEX) 50 MCG/ACT nasal spray Place 2 sprays into the nose daily.   Multiple Vitamin (MULTIVITAMIN WITH MINERALS) TABS tablet Take 1 tablet by mouth daily.   Multiple Vitamins-Minerals (PRESERVISION AREDS 2 PO) Take 1 tablet by mouth daily.   saccharomyces boulardii (FLORASTOR) 250 MG capsule Take 250 mg by mouth daily.   triamterene-hydrochlorothiazide (MAXZIDE-25) 37.5-25 MG tablet Take 1 tablet by mouth daily.   vitamin C (ASCORBIC ACID) 500 MG tablet Take 500 mg by mouth every evening.   No current facility-administered medications for this visit. (Other)      REVIEW OF SYSTEMS:    ALLERGIES Allergies  Allergen Reactions   Penicillins Hives    Has patient had a PCN reaction causing immediate rash, facial/tongue/throat swelling, SOB or lightheadedness with hypotension:## yes ## Has patient had a PCN reaction causing severe rash involving mucus membranes or skin necrosis: no  Has patient had a PCN reaction that required hospitalization: no Has patient had a PCN reaction occurring within the last 10 years: no If all of the above answers are "NO", then may proceed with Cephalosporin use.    Aspirin Hives   Enalapril Cough    wheezing   Adhesive [Tape] Other (See Comments)    "pulls skin off"    PAST MEDICAL HISTORY Past Medical History:  Diagnosis Date   Allergic rhinitis    seasonal   Arthritis    Colon cancer [facility]) Dec 12   Diverticulosis of colon (without mention of hemorrhage)    GI bleed    had cyst removed from spine....went back on plavix to soon   Gout    Hyperlipidemia    Hyperplastic colon polyp    Hypertension    Macular degeneration     PAD (peripheral artery disease) ([place])    Pneumonia    back in 05/2017   Stroke [facility])    TIA  10/2009   Unspecified transient cerebral ischemia sept '11   Past Surgical History:  Procedure Laterality Date   ABDOMINAL AORTOGRAM N/A 06/16/2017   Procedure: ABDOMINAL AORTOGRAM;  Surgeon: Angelia Mould, MD;  Location: [place] [facility];  Service: Cardiovascular;  Laterality: N/A;   APPENDECTOMY     BACK SURGERY  April '12   benign cyst spine ([place])   BREAST LUMPECTOMY     right breast '86   COLON SURGERY  Dec '12   colectomy for colon cancer ([place])   ENDARTERECTOMY FEMORAL Bilateral 08/02/2017   ENDARTERECTOMY FEMORAL Bilateral 08/02/2017   Procedure: Bilateral Femoral ENDARTERECTOMY with Patch Angioplasty;  Surgeon: Rosetta Posner, MD;  Location: [place];  Service: Vascular;  Laterality: Bilateral;   EYE SURGERY     bilateral cataracts   LOWER EXTREMITY ANGIOGRAPHY N/A 06/16/2017   Procedure: LOWER EXTREMITY ANGIOGRAPHY;  Surgeon: Angelia Mould, MD;  Location: [place] [facility];  Service: Cardiovascular;  Laterality: N/A;   PERIPHERAL VASCULAR INTERVENTION  06/16/2017   Procedure: PERIPHERAL VASCULAR INTERVENTION;  Surgeon: Angelia Mould, MD;  Location: [place] [facility];  Service: Cardiovascular;;  left common iliac   TONSILLECTOMY      FAMILY HISTORY Family History  Problem Relation Age of Onset   Colon cancer Father    Hypertension Father    Hyperlipidemia Father    Coronary artery disease Father    Ulcers Father    Heart failure Mother    Coronary artery disease Mother    Breast cancer Mother    Hypertension Mother    Hyperlipidemia Mother    Asthma Mother    Diabetes Neg Hx     SOCIAL HISTORY Social History   Tobacco Use   Smoking status: Former    Packs/day: 1.50    Years: 30.00    Pack years: 45.00    Types: Cigarettes    Quit date: 07/21/1987    Years since quitting: 33.2   Smokeless tobacco: Never  Vaping Use   Vaping Use: Never  used  Substance Use Topics   Alcohol use: Yes    Alcohol/week: 14.0 standard drinks    Types: 14 Glasses of wine per week    Comment: Wine daily   Drug use: No         OPHTHALMIC EXAM:  Base Eye Exam     Visual Acuity (ETDRS)       Right Left   Dist cc 20/200 20/200   Dist ph cc NI  NI    Correction: Glasses         Tonometry (Tonopen, 2:28 PM)       Right Left   Pressure 16 15         Pupils       Pupils Dark Light Shape React APD   Right PERRL 5 4 Irregular Brisk None   Left PERRL 4.5 4 Round Brisk None         Visual Fields (Counting fingers)       Left Right    Full Full         Extraocular Movement       Right Left    Full, Ortho Full, Ortho         Neuro/Psych     Oriented x3: Yes   Mood/Affect: Normal         Dilation     Both eyes: 1.0% Mydriacyl, 2.5% Phenylephrine @ 2:28 PM           Slit Lamp and Fundus Exam     External Exam       Right Left   External Normal Normal         Slit Lamp Exam       Right Left   Lids/Lashes Normal Normal   Conjunctiva/Sclera White and quiet White and quiet   Cornea Clear Clear   Anterior Chamber Deep and quiet Deep and quiet   Iris Round and reactive Round and reactive   Lens Posterior chamber intraocular lens Posterior chamber intraocular lens   Anterior Vitreous Normal Normal         Fundus Exam       Right Left   Posterior Vitreous Posterior vitreous detachment Posterior vitreous detachment   Disc Normal Normal   C/D Ratio 0.7 0.7   Macula Geographic atrophy, 8 DA size Geographic atrophy, 8 DA size   Vessels Normal Normal   Periphery Reticular degenerationIn larger area 1 clock hour, centered at 7-9 o'clock meridia,  of subretinal hemorrhage likely PECHR, peripheral exudative hemorrhagic chorioretinopathy, with extensive chorioretinal atrophy in the temporal periphery anterior to the equator Reticular degeneration            IMAGING AND PROCEDURES  Imaging  and Procedures for 10/01/20  OCT, Retina - OU - Both Eyes       Right Eye Scan locations included superior. Progression has been stable.   Left Eye Scan locations included superior. Progression has been stable.      Color Fundus Photography Optos - OU - Both Eyes       Right Eye Progression has worsened. Disc findings include normal observations. Macula : geographic atrophy. Vessels : normal observations.   Left Eye Progression has been stable. Disc findings include normal observations. Macula : geographic atrophy. Vessels : normal observations.   Notes OD with peripheral PECHR, centered at 8 o'clock position with extensive hemorrhage from the 730 to 9 o'clock position inferotemporal.  This region corresponds with new onset  patient notices superonasal     Focal Laser - OD - Right Eye       Time Out Confirmed correct patient, procedure, site, and patient consented.   Anesthesia Topical anesthesia was used. Anesthetic medications included Proparacaine 0.5%.   Laser Information The type of laser was diode. Color was yellow. The duration in seconds was 0.03. The spot size was 390 microns. Laser power was 260. Total spots was 514.   Post-op The patient tolerated the procedure well. There were no  complications. The patient received written and verbal post procedure care education.      Comprehensive metabolic panel      Component Value Flag Ref Range Units Status   Sodium 135      135 - 145 mEq/L Final   Potassium 4.0      3.5 - 5.1 mEq/L Final   Chloride 93      96 - 112 mEq/L Final   CO2 36      19 - 32 mEq/L Final   Glucose, Bld 96      70 - 99 mg/dL Final   BUN 19      6 - 23 mg/dL Final   Creatinine, Ser 0.93      0.40 - 1.20 mg/dL Final   Total Bilirubin 1.1      0.2 - 1.2 mg/dL Final   Alkaline Phosphatase 68      39 - 117 U/L Final   AST 35      0 - 37 U/L Final   ALT 45      0 - 35 U/L Final   Total Protein 7.4      6.0 - 8.3 g/dL Final   Albumin 4.2       3.5 - 5.2 g/dL Final   GFR 55.36      >60.00 mL/min Final   Comment:   Calculated using the CKD-EPI Creatinine Equation (2021)   Calcium 10.4      8.4 - 10.5 mg/dL Final           CBC with Differential/Platelet      Component Value Flag Ref Range Units Status   WBC 4.4      4.0 - 10.5 K/uL Final   RBC 4.73      3.87 - 5.11 Mil/uL Final   Hemoglobin 16.3      12.0 - 15.0 g/dL Final   HCT 50.0      36.0 - 46.0 % Final   MCV 105.6      78.0 - 100.0 fl Final   MCHC 32.7      30.0 - 36.0 g/dL Final   RDW 15.6      11.5 - 15.5 % Final   Platelets 226.0      150.0 - 400.0 K/uL Final   Neutrophils Relative % 59.9      43.0 - 77.0 % Final   Lymphocytes Relative 23.9      12.0 - 46.0 % Final   Monocytes Relative 13.9      3.0 - 12.0 % Final   Eosinophils Relative 1.4      0.0 - 5.0 % Final   Basophils Relative 0.9      0.0 - 3.0 % Final   Neutro Abs 2.6      1.4 - 7.7 K/uL Final   Lymphs Abs 1.1      0.7 - 4.0 K/uL Final   Monocytes Absolute 0.6      0.1 - 1.0 K/uL Final   Eosinophils Absolute 0.1      0.0 - 0.7 K/uL Final   Basophils Absolute 0.0      0.0 - 0.1 K/uL Final           TSH      Component Value Flag Ref Range Units Status   TSH 2.11      0.35 - 5.50 uIU/mL Final  ASSESSMENT/PLAN:  Advanced nonexudative age-related macular degeneration of both eyes with subfoveal involvement The nature of dry age related macular degeneration was discussed with the patient as well as its possible conversion to wet. The results of the AREDS 2 study was discussed with the patient. A diet rich in dark leafy green vegetables was advised and specific recommendations were made regarding supplements with AREDS 2 formulation . Control of hypertension and serum cholesterol may slow the disease. Smoking cessation is mandatory to slow the disease and diminish the risk of progressing to wet age related macular degeneration. The patient was instructed in the use of an  [place] and was told to return immediately for any changes in the Grid. 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  Goal today will be to laser demarcate this region so as to prevent spread towards the posterior pole and more over to prevent massive enlargement and diminishment of peripheral vision in this right eye  Deep retinal hemorrhage of right eye Secondary to [facility] seen progressive exudative retinopathy OD     ICD-10-CM   1. Progressive exudative retinopathy of right eye  H35.021 Color Fundus Photography Optos - OU - Both Eyes    Focal Laser - OD - Right Eye    2. Advanced nonexudative age-related macular degeneration of both eyes with subfoveal involvement  H35.3134 OCT, Retina - OU - Both Eyes    Color Fundus Photography Optos - OU - Both Eyes    3. Deep retinal hemorrhage of right eye  H35.61 Color Fundus Photography Optos - OU - Both Eyes      1.  New onset PECHR with peripheral subretinal hemorrhage and extensive area inferotemporal OD.  Laser demarcation to prevent and try to delay or forestall this extension of hemorrhage was performed today  2.  Patient informed to continue on her current blood thinners for systemic reasons.  3.  No limitations going forward.  Ophthalmic Meds Ordered this visit:  No orders of the defined types were placed in this encounter.      Return in about 4 weeks (around 10/29/2020) for dilate, OD, COLOR FP.  There are no Patient Instructions on file for this visit.   Explained the diagnoses, plan, and follow up with the patient and they expressed understanding.  Patient expressed understanding of the importance of proper follow up care.   Clent Demark Koralee Wedeking M.D. Diseases & Surgery of the Retina and Vitreous [facility] 10/01/20     Abbreviations: M myopia (nearsighted); A astigmatism; H hyperopia  (farsighted); P presbyopia; Mrx spectacle prescription;  CTL contact lenses; OD right eye; OS left eye; OU both eyes  XT exotropia; ET esotropia; PEK punctate epithelial keratitis; PEE punctate epithelial erosions; DES dry eye syndrome; MGD meibomian gland dysfunction; ATs artificial tears; PFAT's preservative free artificial tears; [place] nuclear sclerotic cataract; PSC posterior subcapsular cataract; ERM epi-retinal membrane; PVD posterior vitreous detachment; RD retinal detachment; DM diabetes mellitus; DR diabetic retinopathy; NPDR non-proliferative diabetic retinopathy; PDR proliferative diabetic retinopathy; CSME clinically significant macular edema; DME diabetic macular edema; dbh dot blot hemorrhages; CWS cotton wool spot; POAG primary open angle glaucoma; C/D cup-to-disc ratio; HVF humphrey visual field; GVF goldmann visual field; OCT optical coherence tomography; IOP intraocular pressure; BRVO Branch retinal vein occlusion; CRVO central retinal vein occlusion; CRAO central retinal artery occlusion; BRAO branch retinal artery occlusion; RT retinal tear; SB scleral buckle; PPV pars  plana vitrectomy; VH Vitreous hemorrhage; PRP panretinal laser photocoagulation; IVK intravitreal kenalog; VMT vitreomacular traction; MH Macular hole;  NVD neovascularization of the disc; NVE neovascularization elsewhere; AREDS age related eye disease study; ARMD age related macular degeneration; POAG primary open angle glaucoma; EBMD epithelial/anterior basement membrane dystrophy; ACIOL anterior chamber intraocular lens; IOL intraocular lens; PCIOL posterior chamber intraocular lens; Phaco/IOL phacoemulsification with intraocular lens placement; [place] photorefractive keratectomy; LASIK laser assisted in situ keratomileusis; HTN hypertension; DM diabetes mellitus; COPD chronic obstructive pulmonary disease

## 2020-10-01 NOTE — Assessment & Plan Note (Signed)
Secondary to [facility] seen progressive exudative retinopathy OD

## 2020-10-01 NOTE — Assessment & Plan Note (Addendum)
Try Trelegy daily to treat asthmatic component There is a possibility of congestive heart failure.  Use Lasix prn Obtain CXR

## 2020-10-01 NOTE — Assessment & Plan Note (Addendum)
Use a Rollator walker

## 2020-10-01 NOTE — Progress Notes (Signed)
Subjective:  Patient ID: [name], female    DOB: Mar 25, 1931  Age: 87 y.o. MRN: [id number]  CC: Follow-up (3 month f/u)   HPI AMAIAH CRISTIANO presents for ongoing cough, some shortness of breath, wheezing the symptoms are not better in spite of previous treatments.  She is here with her son.  Outpatient Medications Prior to Visit  Medication Sig Dispense Refill   acetaminophen (TYLENOL) 500 MG tablet Take 1,000 mg by mouth daily. 4 tabs daily     allopurinol (ZYLOPRIM) 100 MG tablet Take 1 tablet (100 mg total) by mouth daily. 90 tablet 3   amLODipine (NORVASC) 5 MG tablet Take 1 tablet (5 mg total) by mouth daily. 90 tablet 3   atorvastatin (LIPITOR) 40 MG tablet Take 1 tablet (40 mg total) by mouth every evening. 90 tablet 3   Calcium Carbonate (CALCIUM 600 PO) Take 600 mg by mouth 2 (two) times daily.     cetirizine (ZYRTEC) 10 MG tablet Take 10 mg by mouth 2 (two) times daily.      clopidogrel (PLAVIX) 75 MG tablet Take 1 tablet (75 mg total) by mouth daily. 90 tablet 3   COZAAR 50 MG tablet TAKE 1 TABLET DAILY. 90 tablet 3   gabapentin (NEURONTIN) 400 MG capsule Take 1 capsule (400 mg total) by mouth 4 (four) times daily. 360 capsule 3   ketoconazole (NIZORAL) 2 % cream as needed.     Multiple Vitamin (MULTIVITAMIN WITH MINERALS) TABS tablet Take 1 tablet by mouth daily.     Multiple Vitamins-Minerals (PRESERVISION AREDS 2 PO) Take 1 tablet by mouth daily.     saccharomyces boulardii (FLORASTOR) 250 MG capsule Take 250 mg by mouth daily.     triamterene-hydrochlorothiazide (MAXZIDE-25) 37.5-25 MG tablet Take 1 tablet by mouth daily. 90 tablet 3   vitamin C (ASCORBIC ACID) 500 MG tablet Take 500 mg by mouth every evening.     BREZTRI AEROSPHERE 160-9-4.8 MCG/ACT AERO INHALE 2 PUFFS INTO THE LUNGS AT BEDTIME. (Patient not taking: No sig reported) 32.1 g 3   No facility-administered medications prior to visit.    ROS: Review of Systems  Constitutional:  Negative for  activity change, appetite change, chills, fatigue and unexpected weight change.  HENT:  Negative for congestion, mouth sores and sinus pressure.   Eyes:  Negative for visual disturbance.  Respiratory:  Positive for cough, shortness of breath and wheezing. Negative for chest tightness.   Cardiovascular:  Positive for leg swelling.  Gastrointestinal:  Negative for abdominal pain and nausea.  Genitourinary:  Negative for difficulty urinating, frequency and vaginal pain.  Musculoskeletal:  Positive for back pain. Negative for gait problem.  Skin:  Negative for pallor and rash.  Neurological:  Negative for dizziness, tremors, weakness, numbness and headaches.  Psychiatric/Behavioral:  Negative for confusion and sleep disturbance.    Objective:  BP (!) 166/70 (BP Location: Left Arm)   Pulse 85   Temp 98 F (36.7 C) (Oral)   Ht 5' (1.524 m)   Wt 191 lb (86.6 kg)   SpO2 90%   BMI 37.30 kg/m   BP Readings from Last 3 Encounters:  10/01/20 (!) 166/70  06/17/20 (!) 148/70  05/06/20 (!) 142/70    Wt Readings from Last 3 Encounters:  10/01/20 191 lb (86.6 kg)  06/17/20 186 lb 9.6 oz (84.6 kg)  05/06/20 189 lb 9.6 oz (86 kg)    Physical Exam Constitutional:      General: She is not in acute  distress.    Appearance: She is well-developed.  HENT:     Head: Normocephalic.     Right Ear: External ear normal.     Left Ear: External ear normal.     Nose: Nose normal.  Eyes:     General:        Right eye: No discharge.        Left eye: No discharge.     Conjunctiva/sclera: Conjunctivae normal.     Pupils: Pupils are equal, round, and reactive to light.  Neck:     Thyroid: No thyromegaly.     Vascular: No JVD.     Trachea: No tracheal deviation.  Cardiovascular:     Rate and Rhythm: Normal rate and regular rhythm.     Heart sounds: Normal heart sounds.  Pulmonary:     Effort: No respiratory distress.     Breath sounds: No stridor. No wheezing.  Abdominal:     General: Bowel  sounds are normal. There is no distension.     Palpations: Abdomen is soft. There is no mass.     Tenderness: There is no abdominal tenderness. There is no guarding or rebound.  Musculoskeletal:        General: No tenderness.     Cervical back: Normal range of motion and neck supple. No rigidity.  Lymphadenopathy:     Cervical: No cervical adenopathy.  Skin:    Findings: No erythema or rash.  Neurological:     Mental Status: She is oriented to person, place, and time.     Cranial Nerves: No cranial nerve deficit.     Motor: No abnormal muscle tone.     Coordination: Coordination abnormal.     Gait: Gait abnormal.     Deep Tendon Reflexes: Reflexes normal.  Psychiatric:        Behavior: Behavior normal.        Thought Content: Thought content normal.        Judgment: Judgment normal.  Using a cane  I personally provided Trelegy inhaler use teaching. After teaching the patient should be able to use the inhaler effectively. All questions were answered   Lab Results  Component Value Date   WBC 6.0 12/03/2019   HGB 13.4 12/03/2019   HCT 41.2 12/03/2019   PLT 190.0 12/03/2019   GLUCOSE 94 02/04/2020   CHOL 186 05/22/2019   TRIG 268.0 (H) 05/22/2019   HDL 57.80 05/22/2019   LDLDIRECT 80.0 05/22/2019   LDLCALC 42 02/23/2018   ALT 13 12/03/2019   AST 19 12/03/2019   NA 136 02/04/2020   K 4.3 02/04/2020   CL 97 02/04/2020   CREATININE 0.97 02/04/2020   BUN 25 (H) 02/04/2020   CO2 34 (H) 02/04/2020   TSH 2.85 05/22/2019   INR 0.96 07/24/2017   HGBA1C 5.6 05/22/2019    No results found.  Assessment & Plan:   There are no diagnoses linked to this encounter.   No orders of the defined types were placed in this encounter.    Follow-up: No follow-ups on file.  Walker Kehr, MD

## 2020-10-01 NOTE — Assessment & Plan Note (Signed)

## 2020-10-29 ENCOUNTER — Encounter (INDEPENDENT_AMBULATORY_CARE_PROVIDER_SITE_OTHER): Payer: Self-pay | Admitting: Ophthalmology

## 2020-10-29 ENCOUNTER — Ambulatory Visit (INDEPENDENT_AMBULATORY_CARE_PROVIDER_SITE_OTHER): Payer: Medicare Other | Admitting: Ophthalmology

## 2020-10-29 ENCOUNTER — Other Ambulatory Visit: Payer: Self-pay

## 2020-10-29 DIAGNOSIS — H3561 Retinal hemorrhage, right eye: Secondary | ICD-10-CM | POA: Diagnosis not present

## 2020-10-29 DIAGNOSIS — H35021 Exudative retinopathy, right eye: Secondary | ICD-10-CM

## 2020-10-29 DIAGNOSIS — H353134 Nonexudative age-related macular degeneration, bilateral, advanced atrophic with subfoveal involvement: Secondary | ICD-10-CM

## 2020-10-29 NOTE — Assessment & Plan Note (Signed)
No macular change OU

## 2020-10-29 NOTE — Progress Notes (Signed)
10/29/2020     CHIEF COMPLAINT Patient presents for  Chief Complaint  Patient presents with   Retina Follow Up      HISTORY OF PRESENT ILLNESS: Gina [name] is a 87 y.o. female who presents to the clinic today for:   HPI     Retina Follow Up   Patient presents with  Dry AMD.  In both eyes.  This started 1 year ago.  Severity is severe.  Duration of 1 year.        Comments   4 week fu from focal laser od, fp. Patient states vision is stable and unchanged since last visit. Denies any new floaters or FOL.       Last edited by Laurin Coder on 10/29/2020  2:04 PM.      Referring physician: Cassandria Anger, MD [place],  [place] [address]  HISTORICAL INFORMATION:   Selected notes from the MEDICAL RECORD NUMBER    Lab Results  Component Value Date   HGBA1C 5.6 05/22/2019     CURRENT MEDICATIONS: No current outpatient medications on file. (Ophthalmic Drugs)   No current facility-administered medications for this visit. (Ophthalmic Drugs)   Current Outpatient Medications (Other)  Medication Sig   acetaminophen (TYLENOL) 500 MG tablet Take 1,000 mg by mouth daily. 4 tabs daily   allopurinol (ZYLOPRIM) 100 MG tablet Take 1 tablet (100 mg total) by mouth daily.   amLODipine (NORVASC) 5 MG tablet Take 1 tablet (5 mg total) by mouth daily.   atorvastatin (LIPITOR) 40 MG tablet Take 1 tablet (40 mg total) by mouth every evening.   Calcium Carbonate (CALCIUM 600 PO) Take 600 mg by mouth 2 (two) times daily.   cetirizine (ZYRTEC) 10 MG tablet Take 1 tablet (10 mg total) by mouth 2 (two) times daily.   clopidogrel (PLAVIX) 75 MG tablet Take 1 tablet (75 mg total) by mouth daily.   Fluticasone-Umeclidin-Vilant (TRELEGY ELLIPTA) 100-62.5-25 MCG/INH AEPB Inhale 1 puff into the lungs daily.   furosemide (LASIX) 40 MG tablet Take 1 tablet (40 mg total) by mouth daily as needed.   gabapentin (NEURONTIN) 400 MG capsule Take 1 capsule (400 mg total)  by mouth 4 (four) times daily.   ketoconazole (NIZORAL) 2 % cream as needed.   losartan (COZAAR) 50 MG tablet Take 1 tablet (50 mg total) by mouth daily.   mometasone (NASONEX) 50 MCG/ACT nasal spray Place 2 sprays into the nose daily.   Multiple Vitamin (MULTIVITAMIN WITH MINERALS) TABS tablet Take 1 tablet by mouth daily.   Multiple Vitamins-Minerals (PRESERVISION AREDS 2 PO) Take 1 tablet by mouth daily.   saccharomyces boulardii (FLORASTOR) 250 MG capsule Take 250 mg by mouth daily.   triamterene-hydrochlorothiazide (MAXZIDE-25) 37.5-25 MG tablet Take 1 tablet by mouth daily.   vitamin C (ASCORBIC ACID) 500 MG tablet Take 500 mg by mouth every evening.   No current facility-administered medications for this visit. (Other)      REVIEW OF SYSTEMS:    ALLERGIES Allergies  Allergen Reactions   Penicillins Hives    Has patient had a PCN reaction causing immediate rash, facial/tongue/throat swelling, SOB or lightheadedness with hypotension:## yes ## Has patient had a PCN reaction causing severe rash involving mucus membranes or skin necrosis: no Has patient had a PCN reaction that required hospitalization: no Has patient had a PCN reaction occurring within the last 10 years: no If all of the above answers are "NO", then may proceed with Cephalosporin use.  Aspirin Hives   Enalapril Cough    wheezing   Adhesive [Tape] Other (See Comments)    "pulls skin off"    PAST MEDICAL HISTORY Past Medical History:  Diagnosis Date   Allergic rhinitis    seasonal   Arthritis    Colon cancer [facility]) Dec 12   Diverticulosis of colon (without mention of hemorrhage)    GI bleed    had cyst removed from spine....went back on plavix to soon   Gout    Hyperlipidemia    Hyperplastic colon polyp    Hypertension    Macular degeneration    PAD (peripheral artery disease) ([place])    Pneumonia    back in 05/2017   Stroke [facility])    TIA  10/2009   Unspecified transient cerebral ischemia sept '11    Past Surgical History:  Procedure Laterality Date   ABDOMINAL AORTOGRAM N/A 06/16/2017   Procedure: ABDOMINAL AORTOGRAM;  Surgeon: Angelia Mould, MD;  Location: [facility];  Service: Cardiovascular;  Laterality: N/A;   APPENDECTOMY     BACK SURGERY  April '12   benign cyst spine ([place])   BREAST LUMPECTOMY     right breast '86   COLON SURGERY  Dec '12   colectomy for colon cancer ([place])   ENDARTERECTOMY FEMORAL Bilateral 08/02/2017   ENDARTERECTOMY FEMORAL Bilateral 08/02/2017   Procedure: Bilateral Femoral ENDARTERECTOMY with Patch Angioplasty;  Surgeon: Rosetta Posner, MD;  Location: [facility];  Service: Vascular;  Laterality: Bilateral;   EYE SURGERY     bilateral cataracts   LOWER EXTREMITY ANGIOGRAPHY N/A 06/16/2017   Procedure: LOWER EXTREMITY ANGIOGRAPHY;  Surgeon: Angelia Mould, MD;  Location: [facility];  Service: Cardiovascular;  Laterality: N/A;   PERIPHERAL VASCULAR INTERVENTION  06/16/2017   Procedure: PERIPHERAL VASCULAR INTERVENTION;  Surgeon: Angelia Mould, MD;  Location: [facility];  Service: Cardiovascular;;  left common iliac   TONSILLECTOMY      FAMILY HISTORY Family History  Problem Relation Age of Onset   Colon cancer Father    Hypertension Father    Hyperlipidemia Father    Coronary artery disease Father    Ulcers Father    Heart failure Mother    Coronary artery disease Mother    Breast cancer Mother    Hypertension Mother    Hyperlipidemia Mother    Asthma Mother    Diabetes Neg Hx     SOCIAL HISTORY Social History   Tobacco Use   Smoking status: Former    Packs/day: 1.50    Years: 30.00    Pack years: 45.00    Types: Cigarettes    Quit date: 07/21/1987    Years since quitting: 33.2   Smokeless tobacco: Never  Vaping Use   Vaping Use: Never used  Substance Use Topics   Alcohol use: Yes    Alcohol/week: 14.0 standard drinks    Types: 14 Glasses of wine per week    Comment: Wine daily   Drug  use: No         OPHTHALMIC EXAM:  Base Eye Exam     Visual Acuity (ETDRS)       Right Left   Dist cc 20/200 20/200   Dist ph cc NI NI    Correction: Glasses         Tonometry (Tonopen, 2:08 PM)       Right Left   Pressure 13 14         Pupils  Pupils Dark Light APD   Right PERRL 5 4 None   Left PERRL 4.5 4 None         Extraocular Movement       Right Left    Full Full         Neuro/Psych     Oriented x3: Yes   Mood/Affect: Normal         Dilation     Right eye: 1.0% Mydriacyl, 2.5% Phenylephrine @ 2:08 PM           Slit Lamp and Fundus Exam     External Exam       Right Left   External Normal Normal         Slit Lamp Exam       Right Left   Lids/Lashes Normal Normal   Conjunctiva/Sclera White and quiet White and quiet   Cornea Clear Clear   Anterior Chamber Deep and quiet Deep and quiet   Iris Round and reactive Round and reactive   Lens Posterior chamber intraocular lens Posterior chamber intraocular lens   Anterior Vitreous Normal Normal         Fundus Exam       Right Left   Posterior Vitreous Posterior vitreous detachment Posterior vitreous detachment   Disc Normal Normal   C/D Ratio 0.7 0.7   Macula Geographic atrophy, 8 DA size Geographic atrophy, 8 DA size   Vessels Normal Normal   Periphery Reticular degeneration In larger area 1 clock hour, centered at 7-9 o'clock meridia,  of subretinal hemorrhage likely PECHR, peripheral exudative hemorrhagic chorioretinopathy, with extensive chorioretinal atrophy in the temporal periphery anterior to the equator, good laser demarcation inferotemporal and inferiorly for [facility] Reticular degeneration            IMAGING AND PROCEDURES  Imaging and Procedures for 10/29/20  Color Fundus Photography Optos - OU - Both Eyes       Right Eye Progression has worsened. Disc findings include normal observations. Macula : geographic atrophy. Vessels : normal observations.    Left Eye Progression has been stable. Disc findings include normal observations. Macula : geographic atrophy. Vessels : normal observations.   Notes OD with peripheral PECHR, centered at 8 o'clock position with extensive hemorrhage from the 730 to 9 o'clock position inferotemporal.  Now status post laser demarcation, no progression of subretinal hemorrhage will observe this region corresponds with new onset peripheral vision losses patient notices superonasal             ASSESSMENT/PLAN:  Progressive exudative retinopathy of right eye Subretinal hemorrhage inferotemporal, looks successful laser demarcation with no apparent extension beyond the borders treated.  We will follow-up in 2 months to monitor  Advanced nonexudative age-related macular degeneration of both eyes with subfoveal involvement No macular change OU     ICD-10-CM   1. Deep retinal hemorrhage of right eye  H35.61 Color Fundus Photography Optos - OU - Both Eyes    2. Progressive exudative retinopathy of right eye  H35.021     3. Advanced nonexudative age-related macular degeneration of both eyes with subfoveal involvement  H35.3134       1.  Successful laser demarcation of peripheral subretinal hemorrhage inferotemporal OD from [facility], no progression into the area of laser ablation demarcation will observe, 1 month posttreatment  2.  3.  Ophthalmic Meds Ordered this visit:  No orders of the defined types were placed in this encounter.      Return in about 8 weeks (  around 12/24/2020) for dilate, OD, COLOR FP.  There are no Patient Instructions on file for this visit.   Explained the diagnoses, plan, and follow up with the patient and they expressed understanding.  Patient expressed understanding of the importance of proper follow up care.   Clent Demark Sheronda Parran M.D. Diseases & Surgery of the Retina and Vitreous Retina [facility] Diabetic [facility] 10/29/20     Abbreviations: M myopia (nearsighted); A  astigmatism; H hyperopia (farsighted); P presbyopia; Mrx spectacle prescription;  CTL contact lenses; OD right eye; OS left eye; OU both eyes  XT exotropia; ET esotropia; PEK punctate epithelial keratitis; PEE punctate epithelial erosions; DES dry eye syndrome; MGD meibomian gland dysfunction; ATs artificial tears; PFAT's preservative free artificial tears; [place] nuclear sclerotic cataract; PSC posterior subcapsular cataract; ERM epi-retinal membrane; PVD posterior vitreous detachment; RD retinal detachment; DM diabetes mellitus; DR diabetic retinopathy; NPDR non-proliferative diabetic retinopathy; PDR proliferative diabetic retinopathy; CSME clinically significant macular edema; DME diabetic macular edema; dbh dot blot hemorrhages; CWS cotton wool spot; POAG primary open angle glaucoma; C/D cup-to-disc ratio; HVF humphrey visual field; GVF goldmann visual field; OCT optical coherence tomography; IOP intraocular pressure; BRVO Branch retinal vein occlusion; CRVO central retinal vein occlusion; CRAO central retinal artery occlusion; BRAO branch retinal artery occlusion; RT retinal tear; SB scleral buckle; PPV pars plana vitrectomy; VH Vitreous hemorrhage; PRP panretinal laser photocoagulation; IVK intravitreal kenalog; VMT vitreomacular traction; MH Macular hole;  NVD neovascularization of the disc; NVE neovascularization elsewhere; AREDS age related eye disease study; ARMD age related macular degeneration; POAG primary open angle glaucoma; EBMD epithelial/anterior basement membrane dystrophy; ACIOL anterior chamber intraocular lens; IOL intraocular lens; PCIOL posterior chamber intraocular lens; Phaco/IOL phacoemulsification with intraocular lens placement; [place] photorefractive keratectomy; LASIK laser assisted in situ keratomileusis; HTN hypertension; DM diabetes mellitus; COPD chronic obstructive pulmonary disease

## 2020-10-29 NOTE — Assessment & Plan Note (Signed)
Subretinal hemorrhage inferotemporal, looks successful laser demarcation with no apparent extension beyond the borders treated.  We will follow-up in 2 months to monitor

## 2020-11-02 ENCOUNTER — Telehealth: Payer: Self-pay

## 2020-11-02 MED ORDER — TRELEGY ELLIPTA 100-62.5-25 MCG/INH IN AEPB: 1.0000 | INHALATION_SPRAY | Freq: Every day | RESPIRATORY_TRACT | 3 refills | Status: DC

## 2020-11-02 NOTE — Telephone Encounter (Signed)
Called pt she states the new trelegy inhaler work great. [facility]. Inform pt sending rx as we speak. Rx sent to [facility].Marland KitchenJohny Chess

## 2020-11-02 NOTE — Telephone Encounter (Signed)
Patient requesting Trellergy to be sent to [facility] day pharmacy [facility].   Patient requesting a callback from [place] [phone]

## 2020-11-12 ENCOUNTER — Encounter: Payer: Self-pay | Admitting: Internal Medicine

## 2020-11-12 ENCOUNTER — Ambulatory Visit (INDEPENDENT_AMBULATORY_CARE_PROVIDER_SITE_OTHER): Payer: Medicare Other | Admitting: Internal Medicine

## 2020-11-12 ENCOUNTER — Other Ambulatory Visit: Payer: Self-pay

## 2020-11-12 DIAGNOSIS — Z6836 Body mass index (BMI) 36.0-36.9, adult: Secondary | ICD-10-CM

## 2020-11-12 DIAGNOSIS — R059 Cough, unspecified: Secondary | ICD-10-CM | POA: Diagnosis not present

## 2020-11-12 DIAGNOSIS — D539 Nutritional anemia, unspecified: Secondary | ICD-10-CM

## 2020-11-12 DIAGNOSIS — R062 Wheezing: Secondary | ICD-10-CM

## 2020-11-12 LAB — COMPREHENSIVE METABOLIC PANEL
ALT: 17 U/L (ref 0–35)
AST: 24 U/L (ref 0–37)
Albumin: 4.3 g/dL (ref 3.5–5.2)
Alkaline Phosphatase: 65 U/L (ref 39–117)
BUN: 27 mg/dL — ABNORMAL HIGH (ref 6–23)
CO2: 36 mEq/L — ABNORMAL HIGH (ref 19–32)
Calcium: 10.2 mg/dL (ref 8.4–10.5)
Chloride: 95 mEq/L — ABNORMAL LOW (ref 96–112)
Creatinine, Ser: 0.99 mg/dL (ref 0.40–1.20)
GFR: 51.31 mL/min — ABNORMAL LOW (ref >60.00)
Glucose, Bld: 98 mg/dL (ref 70–99)
Potassium: 4 mEq/L (ref 3.5–5.1)
Sodium: 139 mEq/L (ref 135–145)
Total Bilirubin: 1 mg/dL (ref 0.2–1.2)
Total Protein: 7.4 g/dL (ref 6.0–8.3)

## 2020-11-12 LAB — CBC WITH DIFFERENTIAL/PLATELET
Basophils Absolute: 0 10*3/uL (ref 0.0–0.1)
Basophils Relative: 0.6 % (ref 0.0–3.0)
Eosinophils Absolute: 0.1 10*3/uL (ref 0.0–0.7)
Eosinophils Relative: 2.2 % (ref 0.0–5.0)
HCT: 50.5 % — ABNORMAL HIGH (ref 36.0–46.0)
Hemoglobin: 16.6 g/dL — ABNORMAL HIGH (ref 12.0–15.0)
Lymphocytes Relative: 29.3 % (ref 12.0–46.0)
Lymphs Abs: 1.3 10*3/uL (ref 0.7–4.0)
MCHC: 32.8 g/dL (ref 30.0–36.0)
MCV: 104.3 fl — ABNORMAL HIGH (ref 78.0–100.0)
Monocytes Absolute: 0.6 10*3/uL (ref 0.1–1.0)
Monocytes Relative: 12.6 % — ABNORMAL HIGH (ref 3.0–12.0)
Neutro Abs: 2.4 10*3/uL (ref 1.4–7.7)
Neutrophils Relative %: 55.3 % (ref 43.0–77.0)
Platelets: 197 10*3/uL (ref 150.0–400.0)
RBC: 4.84 Mil/uL (ref 3.87–5.11)
RDW: 16.6 % — ABNORMAL HIGH (ref 11.5–15.5)
WBC: 4.4 10*3/uL (ref 4.0–10.5)

## 2020-11-12 LAB — BRAIN NATRIURETIC PEPTIDE: Pro B Natriuretic peptide (BNP): 131 pg/mL — ABNORMAL HIGH (ref 0.0–100.0)

## 2020-11-12 NOTE — Assessment & Plan Note (Signed)
Much better on Trelegy Doing more, less tired

## 2020-11-12 NOTE — Assessment & Plan Note (Addendum)
SOB, runny nose - much better on Trelegy and Furosemide Doing more, less tired Use Furosemide qod

## 2020-11-12 NOTE — Progress Notes (Signed)
Subjective:  Patient ID: Gina [name], female    DOB: Dec 20, 1931  Age: 87 y.o. MRN: [id number]  CC: No chief complaint on file.   HPI Gina [name] presents for fatigue, SOB, runny nose - much better Doing more, less tired Pulse ox 90-92% at home Not taking Furosemide regular  Outpatient Medications Prior to Visit  Medication Sig Dispense Refill   acetaminophen (TYLENOL) 500 MG tablet Take 1,000 mg by mouth daily. 4 tabs daily     allopurinol (ZYLOPRIM) 100 MG tablet Take 1 tablet (100 mg total) by mouth daily. 90 tablet 3   amLODipine (NORVASC) 5 MG tablet Take 1 tablet (5 mg total) by mouth daily. 90 tablet 3   atorvastatin (LIPITOR) 40 MG tablet Take 1 tablet (40 mg total) by mouth every evening. 90 tablet 3   Calcium Carbonate (CALCIUM 600 PO) Take 600 mg by mouth 2 (two) times daily.     cetirizine (ZYRTEC) 10 MG tablet Take 1 tablet (10 mg total) by mouth 2 (two) times daily. 90 tablet 3   clopidogrel (PLAVIX) 75 MG tablet Take 1 tablet (75 mg total) by mouth daily. 90 tablet 3   Fluticasone-Umeclidin-Vilant (TRELEGY ELLIPTA) 100-62.5-25 MCG/INH AEPB Inhale 1 puff into the lungs daily. 3 each 3   furosemide (LASIX) 40 MG tablet Take 1 tablet (40 mg total) by mouth daily as needed. 30 tablet 5   gabapentin (NEURONTIN) 400 MG capsule Take 1 capsule (400 mg total) by mouth 4 (four) times daily. 360 capsule 3   ketoconazole (NIZORAL) 2 % cream as needed.     losartan (COZAAR) 50 MG tablet Take 1 tablet (50 mg total) by mouth daily. 90 tablet 3   mometasone (NASONEX) 50 MCG/ACT nasal spray Place 2 sprays into the nose daily. 17 g 5   Multiple Vitamin (MULTIVITAMIN WITH MINERALS) TABS tablet Take 1 tablet by mouth daily.     Multiple Vitamins-Minerals (PRESERVISION AREDS 2 PO) Take 1 tablet by mouth daily.     saccharomyces boulardii (FLORASTOR) 250 MG capsule Take 250 mg by mouth daily.     triamterene-hydrochlorothiazide (MAXZIDE-25) 37.5-25 MG tablet Take 1 tablet by mouth  daily. 90 tablet 3   vitamin C (ASCORBIC ACID) 500 MG tablet Take 500 mg by mouth every evening.     No facility-administered medications prior to visit.    ROS: Review of Systems  Constitutional:  Positive for fatigue. Negative for activity change, appetite change, chills and unexpected weight change.  HENT:  Negative for congestion, mouth sores and sinus pressure.   Eyes:  Negative for visual disturbance.  Respiratory:  Negative for cough and chest tightness.   Gastrointestinal:  Negative for abdominal pain and nausea.  Genitourinary:  Negative for difficulty urinating, frequency and vaginal pain.  Musculoskeletal:  Negative for back pain and gait problem.  Skin:  Negative for pallor and rash.  Neurological:  Negative for dizziness, tremors, weakness, numbness and headaches.  Psychiatric/Behavioral:  Negative for confusion and sleep disturbance.    Objective:  BP (!) 160/82 (BP Location: Left Arm, Patient Position: Sitting, Cuff Size: Large)   Pulse 94   Temp 97.9 F (36.6 C) (Oral)   Ht 5' (1.524 m)   Wt 189 lb (85.7 kg)   BMI 36.91 kg/m   BP Readings from Last 3 Encounters:  11/12/20 (!) 160/82  10/01/20 (!) 166/70  06/17/20 (!) 148/70    Wt Readings from Last 3 Encounters:  11/12/20 189 lb (85.7 kg)  10/01/20 191 lb (  86.6 kg)  06/17/20 186 lb 9.6 oz (84.6 kg)    Physical Exam Constitutional:      General: She is not in acute distress.    Appearance: She is well-developed. She is obese.  HENT:     Head: Normocephalic.     Right Ear: External ear normal.     Left Ear: External ear normal.     Nose: Nose normal.  Eyes:     General:        Right eye: No discharge.        Left eye: No discharge.     Conjunctiva/sclera: Conjunctivae normal.     Pupils: Pupils are equal, round, and reactive to light.  Neck:     Thyroid: No thyromegaly.     Vascular: No JVD.     Trachea: No tracheal deviation.  Cardiovascular:     Rate and Rhythm: Normal rate and regular  rhythm.     Heart sounds: Normal heart sounds.  Pulmonary:     Effort: No respiratory distress.     Breath sounds: No stridor. No wheezing.  Abdominal:     General: Bowel sounds are normal. There is no distension.     Palpations: Abdomen is soft. There is no mass.     Tenderness: There is no abdominal tenderness. There is no guarding or rebound.  Musculoskeletal:        General: No tenderness.     Cervical back: Normal range of motion and neck supple. No rigidity.  Lymphadenopathy:     Cervical: No cervical adenopathy.  Skin:    Findings: No erythema or rash.  Neurological:     Cranial Nerves: No cranial nerve deficit.     Motor: No abnormal muscle tone.     Coordination: Coordination normal.     Deep Tendon Reflexes: Reflexes normal.  Psychiatric:        Behavior: Behavior normal.        Thought Content: Thought content normal.        Judgment: Judgment normal.   Using a cane  Lab Results  Component Value Date   WBC 4.4 10/01/2020   HGB 16.3 (H) 10/01/2020   HCT 50.0 (H) 10/01/2020   PLT 226.0 10/01/2020   GLUCOSE 96 10/01/2020   CHOL 186 05/22/2019   TRIG 268.0 (H) 05/22/2019   HDL 57.80 05/22/2019   LDLDIRECT 80.0 05/22/2019   LDLCALC 42 02/23/2018   ALT 45 (H) 10/01/2020   AST 35 10/01/2020   NA 135 10/01/2020   K 4.0 10/01/2020   CL 93 (L) 10/01/2020   CREATININE 0.93 10/01/2020   BUN 19 10/01/2020   CO2 36 (H) 10/01/2020   TSH 2.11 10/01/2020   INR 0.96 07/24/2017   HGBA1C 5.6 05/22/2019    No results found.  Assessment & Plan:   Problem List Items Addressed This Visit   None     Follow-up: No follow-ups on file.  [facility] Kehr, MD

## 2020-11-12 NOTE — Assessment & Plan Note (Signed)
Check CBC 

## 2020-11-12 NOTE — Addendum Note (Signed)
Addended by: Boris Lown B on: 11/12/2020 03:10 PM   Modules accepted: Orders

## 2020-11-12 NOTE — Assessment & Plan Note (Signed)
Better Pt lost wt

## 2020-11-12 NOTE — Patient Instructions (Addendum)
Dear Gina [name], Your labs/tests are good, except for decreased kidney function test called GFR.  Your chest x-ray shows fluid congestion on the lungs.  Plan-as we discussed.  Take furosemide please.  Hydrate yourself well with water. Sincerely, AP  Use Furosemide every other day

## 2020-11-15 ENCOUNTER — Other Ambulatory Visit: Payer: Self-pay | Admitting: Internal Medicine

## 2020-11-15 MED ORDER — FUROSEMIDE 40 MG PO TABS: 40.0000 mg | ORAL_TABLET | ORAL | 5 refills | Status: DC

## 2020-11-17 DIAGNOSIS — H10413 Chronic giant papillary conjunctivitis, bilateral: Secondary | ICD-10-CM | POA: Diagnosis not present

## 2020-11-17 DIAGNOSIS — H3561 Retinal hemorrhage, right eye: Secondary | ICD-10-CM | POA: Diagnosis not present

## 2020-11-17 DIAGNOSIS — Z961 Presence of intraocular lens: Secondary | ICD-10-CM | POA: Diagnosis not present

## 2020-11-17 DIAGNOSIS — H353134 Nonexudative age-related macular degeneration, bilateral, advanced atrophic with subfoveal involvement: Secondary | ICD-10-CM | POA: Diagnosis not present

## 2020-12-17 DIAGNOSIS — Z23 Encounter for immunization: Secondary | ICD-10-CM | POA: Diagnosis not present

## 2020-12-18 DIAGNOSIS — L84 Corns and callosities: Secondary | ICD-10-CM | POA: Diagnosis not present

## 2020-12-24 ENCOUNTER — Other Ambulatory Visit: Payer: Self-pay

## 2020-12-24 ENCOUNTER — Ambulatory Visit (INDEPENDENT_AMBULATORY_CARE_PROVIDER_SITE_OTHER): Payer: Medicare Other | Admitting: Ophthalmology

## 2020-12-24 ENCOUNTER — Encounter (INDEPENDENT_AMBULATORY_CARE_PROVIDER_SITE_OTHER): Payer: Self-pay | Admitting: Ophthalmology

## 2020-12-24 DIAGNOSIS — H35021 Exudative retinopathy, right eye: Secondary | ICD-10-CM | POA: Diagnosis not present

## 2020-12-24 DIAGNOSIS — H3561 Retinal hemorrhage, right eye: Secondary | ICD-10-CM | POA: Diagnosis not present

## 2020-12-24 DIAGNOSIS — H353134 Nonexudative age-related macular degeneration, bilateral, advanced atrophic with subfoveal involvement: Secondary | ICD-10-CM

## 2020-12-24 NOTE — Assessment & Plan Note (Signed)
Stable OU 

## 2020-12-24 NOTE — Assessment & Plan Note (Signed)
OD, stabilized condition and less hemorrhage from [facility], peripherally OD, with thinning of the subretinal hemorrhage and diminishing in size no extension beyond laser demarcation and peripheral laser ablation

## 2020-12-24 NOTE — Progress Notes (Signed)
12/24/2020     CHIEF COMPLAINT Patient presents for  Chief Complaint  Patient presents with   Retina Follow Up   History of peripheral PECHR with significant retinal hemorrhage now status post focal laser demarcation and peripheral laser ablation to prevent extension as well as to induce quiescent's of neovascular disease, OD, no symptomatic changes   HISTORY OF PRESENT ILLNESS: Gina [name] is a 87 y.o. female who presents to the clinic today for:   HPI     Retina Follow Up   Patient presents with  Other.  In right eye.  This started 8 weeks ago.  Duration of 8 weeks.  Since onset it is stable.        Comments   8 week f/u OD with FP      Last edited by Reather Littler, COA on 12/24/2020  1:58 PM.      Referring physician: Cassandria Anger, MD [place],  [place] [address]  HISTORICAL INFORMATION:   Selected notes from the MEDICAL RECORD NUMBER    Lab Results  Component Value Date   HGBA1C 5.6 05/22/2019     CURRENT MEDICATIONS: No current outpatient medications on file. (Ophthalmic Drugs)   No current facility-administered medications for this visit. (Ophthalmic Drugs)   Current Outpatient Medications (Other)  Medication Sig   acetaminophen (TYLENOL) 500 MG tablet Take 1,000 mg by mouth daily. 4 tabs daily   allopurinol (ZYLOPRIM) 100 MG tablet Take 1 tablet (100 mg total) by mouth daily.   amLODipine (NORVASC) 5 MG tablet Take 1 tablet (5 mg total) by mouth daily.   atorvastatin (LIPITOR) 40 MG tablet Take 1 tablet (40 mg total) by mouth every evening.   Calcium Carbonate (CALCIUM 600 PO) Take 600 mg by mouth 2 (two) times daily.   cetirizine (ZYRTEC) 10 MG tablet Take 1 tablet (10 mg total) by mouth 2 (two) times daily.   clopidogrel (PLAVIX) 75 MG tablet Take 1 tablet (75 mg total) by mouth daily.   Fluticasone-Umeclidin-Vilant (TRELEGY ELLIPTA) 100-62.5-25 MCG/INH AEPB Inhale 1 puff into the lungs daily.   furosemide (LASIX)  40 MG tablet Take 1 tablet (40 mg total) by mouth every other day.   gabapentin (NEURONTIN) 400 MG capsule Take 1 capsule (400 mg total) by mouth 4 (four) times daily.   ketoconazole (NIZORAL) 2 % cream as needed.   losartan (COZAAR) 50 MG tablet Take 1 tablet (50 mg total) by mouth daily.   mometasone (NASONEX) 50 MCG/ACT nasal spray Place 2 sprays into the nose daily.   Multiple Vitamin (MULTIVITAMIN WITH MINERALS) TABS tablet Take 1 tablet by mouth daily.   Multiple Vitamins-Minerals (PRESERVISION AREDS 2 PO) Take 1 tablet by mouth daily.   saccharomyces boulardii (FLORASTOR) 250 MG capsule Take 250 mg by mouth daily.   triamterene-hydrochlorothiazide (MAXZIDE-25) 37.5-25 MG tablet Take 1 tablet by mouth daily.   vitamin C (ASCORBIC ACID) 500 MG tablet Take 500 mg by mouth every evening.   No current facility-administered medications for this visit. (Other)      REVIEW OF SYSTEMS:    ALLERGIES Allergies  Allergen Reactions   Penicillins Hives    Has patient had a PCN reaction causing immediate rash, facial/tongue/throat swelling, SOB or lightheadedness with hypotension:## yes ## Has patient had a PCN reaction causing severe rash involving mucus membranes or skin necrosis: no Has patient had a PCN reaction that required hospitalization: no Has patient had a PCN reaction occurring within the last 10  years: no If all of the above answers are "NO", then may proceed with Cephalosporin use.    Aspirin Hives   Enalapril Cough    wheezing   Adhesive [Tape] Other (See Comments)    "pulls skin off"    PAST MEDICAL HISTORY Past Medical History:  Diagnosis Date   Allergic rhinitis    seasonal   Arthritis    Colon cancer [facility]) Dec 12   Diverticulosis of colon (without mention of hemorrhage)    GI bleed    had cyst removed from spine....went back on plavix to soon   Gout    Hyperlipidemia    Hyperplastic colon polyp    Hypertension    Macular degeneration    PAD (peripheral  artery disease) ([place])    Pneumonia    back in 05/2017   Stroke [facility])    TIA  10/2009   Unspecified transient cerebral ischemia sept '11   Past Surgical History:  Procedure Laterality Date   ABDOMINAL AORTOGRAM N/A 06/16/2017   Procedure: ABDOMINAL AORTOGRAM;  Surgeon: Angelia Mould, MD;  Location: [place] [facility];  Service: Cardiovascular;  Laterality: N/A;   APPENDECTOMY     BACK SURGERY  April '12   benign cyst spine ([place])   BREAST LUMPECTOMY     right breast '86   COLON SURGERY  Dec '12   colectomy for colon cancer ([place])   ENDARTERECTOMY FEMORAL Bilateral 08/02/2017   ENDARTERECTOMY FEMORAL Bilateral 08/02/2017   Procedure: Bilateral Femoral ENDARTERECTOMY with Patch Angioplasty;  Surgeon: Rosetta Posner, MD;  Location: [place];  Service: Vascular;  Laterality: Bilateral;   EYE SURGERY     bilateral cataracts   LOWER EXTREMITY ANGIOGRAPHY N/A 06/16/2017   Procedure: LOWER EXTREMITY ANGIOGRAPHY;  Surgeon: Angelia Mould, MD;  Location: [place] [facility];  Service: Cardiovascular;  Laterality: N/A;   PERIPHERAL VASCULAR INTERVENTION  06/16/2017   Procedure: PERIPHERAL VASCULAR INTERVENTION;  Surgeon: Angelia Mould, MD;  Location: [place] [facility];  Service: Cardiovascular;;  left common iliac   TONSILLECTOMY      FAMILY HISTORY Family History  Problem Relation Age of Onset   Colon cancer Father    Hypertension Father    Hyperlipidemia Father    Coronary artery disease Father    Ulcers Father    Heart failure Mother    Coronary artery disease Mother    Breast cancer Mother    Hypertension Mother    Hyperlipidemia Mother    Asthma Mother    Diabetes Neg Hx     SOCIAL HISTORY Social History   Tobacco Use   Smoking status: Former    Packs/day: 1.50    Years: 30.00    Pack years: 45.00    Types: Cigarettes    Quit date: 07/21/1987    Years since quitting: 33.4   Smokeless tobacco: Never  Vaping Use   Vaping Use: Never used  Substance  Use Topics   Alcohol use: Yes    Alcohol/week: 14.0 standard drinks    Types: 14 Glasses of wine per week    Comment: Wine daily   Drug use: No         OPHTHALMIC EXAM:  Base Eye Exam     Visual Acuity (ETDRS)       Right Left   Dist cc 20/125 20/125 +1   Dist ph cc NI NI    Correction: Glasses         Tonometry (Tonopen, 2:08 PM)  Right Left   Pressure 12 13         Pupils       Pupils Dark Light Shape React APD   Right PERRL 5 4 Round Brisk None   Left PERRL 5 4 Round Brisk None         Visual Fields (Counting fingers)       Left Right    Full Full         Extraocular Movement       Right Left    Full, Ortho Full, Ortho         Neuro/Psych     Oriented x3: Yes   Mood/Affect: Normal         Dilation     Right eye: 1.0% Mydriacyl, 2.5% Phenylephrine @ 2:07 PM           Slit Lamp and Fundus Exam     External Exam       Right Left   External Normal Normal         Slit Lamp Exam       Right Left   Lids/Lashes Normal Normal   Conjunctiva/Sclera White and quiet White and quiet   Cornea Clear Clear   Anterior Chamber Deep and quiet Deep and quiet   Iris Round and reactive Round and reactive   Lens Posterior chamber intraocular lens Posterior chamber intraocular lens   Anterior Vitreous Normal Normal         Fundus Exam       Right Left   Posterior Vitreous Posterior vitreous detachment    Disc Normal    C/D Ratio 0.7 0.7   Macula Geographic atrophy, 8 DA size    Vessels Normal    Periphery Reticular degeneration In larger area 1 clock hour, centered at 7-9 o'clock meridia,  of subretinal hemorrhage likely PECHR, peripheral exudative hemorrhagic chorioretinopathy, with extensive chorioretinal atrophy in the temporal periphery anterior to the equator, good laser demarcation inferotemporal and inferiorly for [facility], with subretinal hemorrhage inferotemporally smaller and starting to subside well delineated and  demarcated with no extension beyond laser barrier but also with the laser ablation of the peripheral retina             IMAGING AND PROCEDURES  Imaging and Procedures for 12/24/20  Color Fundus Photography Optos - OU - Both Eyes       Right Eye Progression has worsened. Disc findings include normal observations. Macula : geographic atrophy. Vessels : normal observations.   Left Eye Progression has been stable. Disc findings include normal observations. Macula : geographic atrophy. Vessels : normal observations.   Notes OD with peripheral PECHR, centered at 8 o'clock position with extensive hemorrhage from the 730 to 9 o'clock position inferotemporal.  Now status post laser demarcation, no progression of subretinal hemorrhage and in fact now some resolution and thinning of the hemorrhage will observe this region              ASSESSMENT/PLAN:  Progressive exudative retinopathy of right eye OD, stabilized condition and less hemorrhage from [facility], peripherally OD, with thinning of the subretinal hemorrhage and diminishing in size no extension beyond laser demarcation and peripheral laser ablation  Deep retinal hemorrhage of right eye Smaller hemorrhage inferotemporal secondary to [facility]  Advanced nonexudative age-related macular degeneration of both eyes with subfoveal involvement Stable OU     ICD-10-CM   1. Deep retinal hemorrhage of right eye  H35.61 Color Fundus Photography Optos - OU - Both Eyes  2. Progressive exudative retinopathy of right eye  H35.021     3. Advanced nonexudative age-related macular degeneration of both eyes with subfoveal involvement  H35.3134       1.  2.  3.  Ophthalmic Meds Ordered this visit:  No orders of the defined types were placed in this encounter.      Return in about 4 months (around 04/23/2021) for DILATE OU, COLOR FP.  There are no Patient Instructions on file for this visit.   Explained the diagnoses, plan, and  follow up with the patient and they expressed understanding.  Patient expressed understanding of the importance of proper follow up care.   Clent Demark Rashia Mckesson M.D. Diseases & Surgery of the Retina and Vitreous [facility] [place] 12/24/20     Abbreviations: M myopia (nearsighted); A astigmatism; H hyperopia (farsighted); P presbyopia; Mrx spectacle prescription;  CTL contact lenses; OD right eye; OS left eye; OU both eyes  XT exotropia; ET esotropia; PEK punctate epithelial keratitis; PEE punctate epithelial erosions; DES dry eye syndrome; MGD meibomian gland dysfunction; ATs artificial tears; PFAT's preservative free artificial tears; [place] nuclear sclerotic cataract; PSC posterior subcapsular cataract; ERM epi-retinal membrane; PVD posterior vitreous detachment; RD retinal detachment; DM diabetes mellitus; DR diabetic retinopathy; NPDR non-proliferative diabetic retinopathy; PDR proliferative diabetic retinopathy; CSME clinically significant macular edema; DME diabetic macular edema; dbh dot blot hemorrhages; CWS cotton wool spot; POAG primary open angle glaucoma; C/D cup-to-disc ratio; HVF humphrey visual field; GVF goldmann visual field; OCT optical coherence tomography; IOP intraocular pressure; BRVO Branch retinal vein occlusion; CRVO central retinal vein occlusion; CRAO central retinal artery occlusion; BRAO branch retinal artery occlusion; RT retinal tear; SB scleral buckle; PPV pars plana vitrectomy; VH Vitreous hemorrhage; PRP panretinal laser photocoagulation; IVK intravitreal kenalog; VMT vitreomacular traction; MH Macular hole;  NVD neovascularization of the disc; NVE neovascularization elsewhere; AREDS age related eye disease study; ARMD age related macular degeneration; POAG primary open angle glaucoma; EBMD epithelial/anterior basement membrane dystrophy; ACIOL anterior chamber intraocular lens; IOL intraocular lens; PCIOL posterior chamber intraocular lens; Phaco/IOL  phacoemulsification with intraocular lens placement; [place] photorefractive keratectomy; LASIK laser assisted in situ keratomileusis; HTN hypertension; DM diabetes mellitus; COPD chronic obstructive pulmonary disease

## 2020-12-24 NOTE — Assessment & Plan Note (Signed)
Smaller hemorrhage inferotemporal secondary to [facility]

## 2020-12-25 DIAGNOSIS — Z1231 Encounter for screening mammogram for malignant neoplasm of breast: Secondary | ICD-10-CM | POA: Diagnosis not present

## 2020-12-25 LAB — HM MAMMOGRAPHY

## 2021-01-06 ENCOUNTER — Encounter: Payer: Self-pay | Admitting: Internal Medicine

## 2021-01-19 ENCOUNTER — Encounter: Payer: Self-pay | Admitting: Internal Medicine

## 2021-01-19 ENCOUNTER — Ambulatory Visit (INDEPENDENT_AMBULATORY_CARE_PROVIDER_SITE_OTHER): Payer: Medicare Other | Admitting: Internal Medicine

## 2021-01-19 ENCOUNTER — Other Ambulatory Visit: Payer: Self-pay

## 2021-01-19 VITALS — BP 140/60 | HR 92 | Temp 99.0°F | Ht 60.0 in | Wt 184.0 lb

## 2021-01-19 DIAGNOSIS — D539 Nutritional anemia, unspecified: Secondary | ICD-10-CM

## 2021-01-19 DIAGNOSIS — I10 Essential (primary) hypertension: Secondary | ICD-10-CM | POA: Diagnosis not present

## 2021-01-19 DIAGNOSIS — M5440 Lumbago with sciatica, unspecified side: Secondary | ICD-10-CM | POA: Diagnosis not present

## 2021-01-19 DIAGNOSIS — L84 Corns and callosities: Secondary | ICD-10-CM | POA: Insufficient documentation

## 2021-01-19 DIAGNOSIS — R062 Wheezing: Secondary | ICD-10-CM | POA: Diagnosis not present

## 2021-01-19 DIAGNOSIS — G8929 Other chronic pain: Secondary | ICD-10-CM

## 2021-01-19 DIAGNOSIS — R0609 Other forms of dyspnea: Secondary | ICD-10-CM | POA: Diagnosis not present

## 2021-01-19 NOTE — Assessment & Plan Note (Signed)
New R foot w/2 calluses, L foot w/1 - very painful See procedure

## 2021-01-19 NOTE — Assessment & Plan Note (Signed)
Cont w/Gabapentin

## 2021-01-19 NOTE — Progress Notes (Signed)
Subjective:  Patient ID: Gina [name], female    DOB: August 07, 1931  Age: 87 y.o. MRN: [id number]  CC: No chief complaint on file.   HPI ALEKHYA GRAVLIN presents for R>L foot pain - severe w/standing, walking  Outpatient Medications Prior to Visit  Medication Sig Dispense Refill   acetaminophen (TYLENOL) 500 MG tablet Take 1,000 mg by mouth daily. 4 tabs daily     allopurinol (ZYLOPRIM) 100 MG tablet Take 1 tablet (100 mg total) by mouth daily. 90 tablet 3   amLODipine (NORVASC) 5 MG tablet Take 1 tablet (5 mg total) by mouth daily. 90 tablet 3   atorvastatin (LIPITOR) 40 MG tablet Take 1 tablet (40 mg total) by mouth every evening. 90 tablet 3   Calcium Carbonate (CALCIUM 600 PO) Take 600 mg by mouth 2 (two) times daily.     cetirizine (ZYRTEC) 10 MG tablet Take 1 tablet (10 mg total) by mouth 2 (two) times daily. 90 tablet 3   clopidogrel (PLAVIX) 75 MG tablet Take 1 tablet (75 mg total) by mouth daily. 90 tablet 3   Fluticasone-Umeclidin-Vilant (TRELEGY ELLIPTA) 100-62.5-25 MCG/INH AEPB Inhale 1 puff into the lungs daily. 3 each 3   furosemide (LASIX) 40 MG tablet Take 1 tablet (40 mg total) by mouth every other day. 30 tablet 5   gabapentin (NEURONTIN) 400 MG capsule Take 1 capsule (400 mg total) by mouth 4 (four) times daily. 360 capsule 3   ketoconazole (NIZORAL) 2 % cream as needed.     losartan (COZAAR) 50 MG tablet Take 1 tablet (50 mg total) by mouth daily. 90 tablet 3   mometasone (NASONEX) 50 MCG/ACT nasal spray Place 2 sprays into the nose daily. 17 g 5   Multiple Vitamin (MULTIVITAMIN WITH MINERALS) TABS tablet Take 1 tablet by mouth daily.     Multiple Vitamins-Minerals (PRESERVISION AREDS 2 PO) Take 1 tablet by mouth daily.     saccharomyces boulardii (FLORASTOR) 250 MG capsule Take 250 mg by mouth daily.     triamterene-hydrochlorothiazide (MAXZIDE-25) 37.5-25 MG tablet Take 1 tablet by mouth daily. 90 tablet 3   vitamin C (ASCORBIC ACID) 500 MG tablet Take 500 mg  by mouth every evening.     No facility-administered medications prior to visit.    ROS: Review of Systems  Constitutional:  Positive for fatigue. Negative for activity change, appetite change, chills and unexpected weight change.  HENT:  Negative for congestion, mouth sores and sinus pressure.   Eyes:  Negative for visual disturbance.  Respiratory:  Negative for cough and chest tightness.   Gastrointestinal:  Negative for abdominal pain and nausea.  Genitourinary:  Negative for difficulty urinating, frequency and vaginal pain.  Musculoskeletal:  Positive for arthralgias, gait problem and neck stiffness. Negative for back pain.  Skin:  Negative for pallor and rash.  Neurological:  Negative for dizziness, tremors, weakness, numbness and headaches.  Psychiatric/Behavioral:  Negative for confusion, decreased concentration and sleep disturbance. The patient is not nervous/anxious.    Objective:  BP 140/60 (BP Location: Left Arm, Patient Position: Sitting, Cuff Size: Large)   Pulse 92   Temp 99 F (37.2 C) (Oral)   Ht 5' (1.524 m)   Wt 184 lb (83.5 kg)   SpO2 94%   BMI 35.94 kg/m   BP Readings from Last 3 Encounters:  01/19/21 140/60  11/12/20 (!) 160/82  10/01/20 (!) 166/70    Wt Readings from Last 3 Encounters:  01/19/21 184 lb (83.5 kg)  11/12/20 189  lb (85.7 kg)  10/01/20 191 lb (86.6 kg)    Physical Exam Constitutional:      General: She is not in acute distress.    Appearance: She is well-developed. She is obese.  HENT:     Head: Normocephalic.     Right Ear: External ear normal.     Left Ear: External ear normal.     Nose: Nose normal.  Eyes:     General:        Right eye: No discharge.        Left eye: No discharge.     Conjunctiva/sclera: Conjunctivae normal.     Pupils: Pupils are equal, round, and reactive to light.  Neck:     Thyroid: No thyromegaly.     Vascular: No JVD.     Trachea: No tracheal deviation.  Cardiovascular:     Rate and Rhythm:  Normal rate and regular rhythm.     Heart sounds: Normal heart sounds.  Pulmonary:     Effort: No respiratory distress.     Breath sounds: No stridor. No wheezing.  Abdominal:     General: Bowel sounds are normal. There is no distension.     Palpations: Abdomen is soft. There is no mass.     Tenderness: There is no abdominal tenderness. There is no guarding or rebound.  Musculoskeletal:        General: Tenderness present. No deformity.     Cervical back: Normal range of motion and neck supple. No rigidity.     Right lower leg: Edema present.     Left lower leg: Edema present.  Lymphadenopathy:     Cervical: No cervical adenopathy.  Skin:    Findings: No erythema or rash.  Neurological:     Mental Status: She is oriented to person, place, and time.     Cranial Nerves: No cranial nerve deficit.     Motor: No abnormal muscle tone.     Coordination: Coordination abnormal.     Gait: Gait abnormal.     Deep Tendon Reflexes: Reflexes normal.  Psychiatric:        Behavior: Behavior normal.        Thought Content: Thought content normal.        Judgment: Judgment normal.  1+ B edema on shins B feet are warm R foot w/2 calluses, L foot w/1 - painful  Procedure:    foot callus paring/cutting  Indication:    R foot w/2 calluces, L foot w/1 - painful Consent: verbal  Risks and benefits were explained to the patient. Skin was cleaned with alcohol. I removed 3 large calluses carefully with a round blade. Skin remained intact. Pain is better. Tolerated well. Complications: none. Bandaids applied.   Lab Results  Component Value Date   WBC 4.4 11/12/2020   HGB 16.6 (H) 11/12/2020   HCT 50.5 (H) 11/12/2020   PLT 197.0 11/12/2020   GLUCOSE 98 11/12/2020   CHOL 186 05/22/2019   TRIG 268.0 (H) 05/22/2019   HDL 57.80 05/22/2019   LDLDIRECT 80.0 05/22/2019   LDLCALC 42 02/23/2018   ALT 17 11/12/2020   AST 24 11/12/2020   NA 139 11/12/2020   K 4.0 11/12/2020   CL 95 (L) 11/12/2020    CREATININE 0.99 11/12/2020   BUN 27 (H) 11/12/2020   CO2 36 (H) 11/12/2020   TSH 2.11 10/01/2020   INR 0.96 07/24/2017   HGBA1C 5.6 05/22/2019    No results found.  Assessment & Plan:  Problem List Items Addressed This Visit     Callus of foot    New R foot w/2 calluses, L foot w/1 - very painful See procedure      DOE (dyspnea on exertion)    Likely CHF - controlled w/po Lasix      Relevant Orders   Comprehensive metabolic panel   B Nat Peptide   CBC with Differential/Platelet   TSH   Essential hypertension   Relevant Orders   TSH   Low back pain    Cont w/Gabapentin      Macrocytic anemia - Primary    Will check CBC      Relevant Orders   CBC with Differential/Platelet   TSH   Wheezing    Resolved w/CHF treatment Use Furosemide qod         No orders of the defined types were placed in this encounter.     Follow-up: Return in about 3 months (around 04/19/2021) for a follow-up visit.  Walker Kehr, MD

## 2021-01-19 NOTE — Assessment & Plan Note (Signed)
Resolved w/CHF treatment Use Furosemide qod

## 2021-01-19 NOTE — Assessment & Plan Note (Signed)
Will check CBC

## 2021-01-19 NOTE — Assessment & Plan Note (Signed)
Likely CHF - controlled w/po Lasix

## 2021-04-15 ENCOUNTER — Other Ambulatory Visit (INDEPENDENT_AMBULATORY_CARE_PROVIDER_SITE_OTHER): Payer: Medicare Other

## 2021-04-15 DIAGNOSIS — R0609 Other forms of dyspnea: Secondary | ICD-10-CM

## 2021-04-15 DIAGNOSIS — I10 Essential (primary) hypertension: Secondary | ICD-10-CM

## 2021-04-15 DIAGNOSIS — D539 Nutritional anemia, unspecified: Secondary | ICD-10-CM | POA: Diagnosis not present

## 2021-04-15 LAB — CBC WITH DIFFERENTIAL/PLATELET
Basophils Absolute: 0 10*3/uL (ref 0.0–0.1)
Basophils Relative: 0.8 % (ref 0.0–3.0)
Eosinophils Absolute: 0.1 10*3/uL (ref 0.0–0.7)
Eosinophils Relative: 3.4 % (ref 0.0–5.0)
HCT: 50.3 % — ABNORMAL HIGH (ref 36.0–46.0)
Hemoglobin: 16.5 g/dL — ABNORMAL HIGH (ref 12.0–15.0)
Lymphocytes Relative: 23.8 % (ref 12.0–46.0)
Lymphs Abs: 1 10*3/uL (ref 0.7–4.0)
MCHC: 32.9 g/dL (ref 30.0–36.0)
MCV: 106.6 fl — ABNORMAL HIGH (ref 78.0–100.0)
Monocytes Absolute: 0.5 10*3/uL (ref 0.1–1.0)
Monocytes Relative: 12.2 % — ABNORMAL HIGH (ref 3.0–12.0)
Neutro Abs: 2.5 10*3/uL (ref 1.4–7.7)
Neutrophils Relative %: 59.8 % (ref 43.0–77.0)
Platelets: 176 10*3/uL (ref 150.0–400.0)
RBC: 4.71 Mil/uL (ref 3.87–5.11)
RDW: 15.8 % — ABNORMAL HIGH (ref 11.5–15.5)
WBC: 4.2 10*3/uL (ref 4.0–10.5)

## 2021-04-15 LAB — COMPREHENSIVE METABOLIC PANEL
ALT: 17 U/L (ref 0–35)
AST: 23 U/L (ref 0–37)
Albumin: 4.2 g/dL (ref 3.5–5.2)
Alkaline Phosphatase: 61 U/L (ref 39–117)
BUN: 29 mg/dL — ABNORMAL HIGH (ref 6–23)
CO2: 32 mEq/L (ref 19–32)
Calcium: 9.9 mg/dL (ref 8.4–10.5)
Chloride: 98 mEq/L (ref 96–112)
Creatinine, Ser: 1.02 mg/dL (ref 0.40–1.20)
GFR: 49.36 mL/min — ABNORMAL LOW (ref >60.00)
Glucose, Bld: 104 mg/dL — ABNORMAL HIGH (ref 70–99)
Potassium: 4.2 mEq/L (ref 3.5–5.1)
Sodium: 137 mEq/L (ref 135–145)
Total Bilirubin: 1 mg/dL (ref 0.2–1.2)
Total Protein: 7.3 g/dL (ref 6.0–8.3)

## 2021-04-15 LAB — TSH: TSH: 3.32 u[IU]/mL (ref 0.35–5.50)

## 2021-04-20 ENCOUNTER — Encounter: Payer: Self-pay | Admitting: Internal Medicine

## 2021-04-20 ENCOUNTER — Ambulatory Visit (INDEPENDENT_AMBULATORY_CARE_PROVIDER_SITE_OTHER): Payer: Medicare Other | Admitting: Internal Medicine

## 2021-04-20 VITALS — BP 140/62 | HR 96 | Temp 98.7°F | Ht 60.0 in | Wt 187.0 lb

## 2021-04-20 DIAGNOSIS — G8929 Other chronic pain: Secondary | ICD-10-CM | POA: Diagnosis not present

## 2021-04-20 DIAGNOSIS — M79671 Pain in right foot: Secondary | ICD-10-CM | POA: Diagnosis not present

## 2021-04-20 DIAGNOSIS — M79672 Pain in left foot: Secondary | ICD-10-CM

## 2021-04-20 DIAGNOSIS — M5440 Lumbago with sciatica, unspecified side: Secondary | ICD-10-CM

## 2021-04-20 DIAGNOSIS — L84 Corns and callosities: Secondary | ICD-10-CM

## 2021-04-20 NOTE — Assessment & Plan Note (Signed)
B feet pain, calluses - not better ?Podiatry consult ?

## 2021-04-20 NOTE — Assessment & Plan Note (Addendum)
Gabapentin is helping ?Get a grocery cart for home ?

## 2021-04-20 NOTE — Progress Notes (Signed)
? ?Subjective:  ?Patient ID: Gina [name], female    DOB: 11/10/1931  Age: 88 y.o. MRN: [id number] ? ?CC: No chief complaint on file. ? ? ?HPI ?Gina [name] presents for B feet pain, calluses - not better. ? ?Taking Tylenol and Gabapentin for pain  ? ?F/u abn CBC, HTN ? ?C/o occasional involuntary shaking x 15 seconds. No falls ? ?Outpatient Medications Prior to Visit  ?Medication Sig Dispense Refill  ? acetaminophen (TYLENOL) 500 MG tablet Take 1,000 mg by mouth daily. 4 tabs daily    ? allopurinol (ZYLOPRIM) 100 MG tablet Take 1 tablet (100 mg total) by mouth daily. 90 tablet 3  ? amLODipine (NORVASC) 5 MG tablet Take 1 tablet (5 mg total) by mouth daily. 90 tablet 3  ? atorvastatin (LIPITOR) 40 MG tablet Take 1 tablet (40 mg total) by mouth every evening. 90 tablet 3  ? Calcium Carbonate (CALCIUM 600 PO) Take 600 mg by mouth 2 (two) times daily.    ? cetirizine (ZYRTEC) 10 MG tablet Take 1 tablet (10 mg total) by mouth 2 (two) times daily. 90 tablet 3  ? clopidogrel (PLAVIX) 75 MG tablet Take 1 tablet (75 mg total) by mouth daily. 90 tablet 3  ? Fluticasone-Umeclidin-Vilant (TRELEGY ELLIPTA) 100-62.5-25 MCG/INH AEPB Inhale 1 puff into the lungs daily. 3 each 3  ? furosemide (LASIX) 40 MG tablet Take 1 tablet (40 mg total) by mouth every other day. 30 tablet 5  ? gabapentin (NEURONTIN) 400 MG capsule Take 1 capsule (400 mg total) by mouth 4 (four) times daily. 360 capsule 3  ? ketoconazole (NIZORAL) 2 % cream as needed.    ? losartan (COZAAR) 50 MG tablet Take 1 tablet (50 mg total) by mouth daily. 90 tablet 3  ? mometasone (NASONEX) 50 MCG/ACT nasal spray Place 2 sprays into the nose daily. 17 g 5  ? Multiple Vitamin (MULTIVITAMIN WITH MINERALS) TABS tablet Take 1 tablet by mouth daily.    ? Multiple Vitamins-Minerals (PRESERVISION AREDS 2 PO) Take 1 tablet by mouth daily.    ? saccharomyces boulardii (FLORASTOR) 250 MG capsule Take 250 mg by mouth daily.    ? triamterene-hydrochlorothiazide (MAXZIDE-25)  37.5-25 MG tablet Take 1 tablet by mouth daily. 90 tablet 3  ? vitamin C (ASCORBIC ACID) 500 MG tablet Take 500 mg by mouth every evening.    ? ?No facility-administered medications prior to visit.  ? ? ?ROS: ?Review of Systems  ?Constitutional:  Negative for activity change, appetite change, chills, fatigue and unexpected weight change.  ?HENT:  Negative for congestion, mouth sores and sinus pressure.   ?Eyes:  Negative for visual disturbance.  ?Respiratory:  Negative for cough and chest tightness.   ?Gastrointestinal:  Negative for abdominal pain and nausea.  ?Genitourinary:  Negative for difficulty urinating, frequency and vaginal pain.  ?Musculoskeletal:  Positive for arthralgias, back pain and gait problem.  ?Skin:  Negative for pallor and rash.  ?Neurological:  Negative for dizziness, tremors, weakness, numbness and headaches.  ?Psychiatric/Behavioral:  Negative for confusion and sleep disturbance.   ? ?Objective:  ?BP 140/62 (BP Location: Left Arm, Patient Position: Sitting, Cuff Size: Large)   Pulse 96   Temp 98.7 ?F (37.1 ?C) (Oral)   Ht 5' (1.524 m)   Wt 187 lb (84.8 kg)   SpO2 90%   BMI 36.52 kg/m?  ? ?BP Readings from Last 3 Encounters:  ?04/20/21 140/62  ?01/19/21 140/60  ?11/12/20 (!) 160/82  ? ? ?Wt Readings from Last 3 Encounters:  ?  04/20/21 187 lb (84.8 kg)  ?01/19/21 184 lb (83.5 kg)  ?11/12/20 189 lb (85.7 kg)  ? ? ?Physical Exam ?Constitutional:   ?   General: She is not in acute distress. ?   Appearance: She is well-developed. She is obese.  ?HENT:  ?   Head: Normocephalic.  ?   Right Ear: External ear normal.  ?   Left Ear: External ear normal.  ?   Nose: Nose normal.  ?Eyes:  ?   General:     ?   Right eye: No discharge.     ?   Left eye: No discharge.  ?   Conjunctiva/sclera: Conjunctivae normal.  ?   Pupils: Pupils are equal, round, and reactive to light.  ?Neck:  ?   Thyroid: No thyromegaly.  ?   Vascular: No JVD.  ?   Trachea: No tracheal deviation.  ?Cardiovascular:  ?   Rate and  Rhythm: Normal rate and regular rhythm.  ?   Heart sounds: Normal heart sounds.  ?Pulmonary:  ?   Effort: No respiratory distress.  ?   Breath sounds: No stridor. No wheezing.  ?Abdominal:  ?   General: Bowel sounds are normal. There is no distension.  ?   Palpations: Abdomen is soft. There is no mass.  ?   Tenderness: There is no abdominal tenderness. There is no guarding or rebound.  ?Musculoskeletal:     ?   General: Tenderness present.  ?   Cervical back: Normal range of motion and neck supple. No rigidity.  ?   Right lower leg: No edema.  ?   Left lower leg: No edema.  ?Lymphadenopathy:  ?   Cervical: No cervical adenopathy.  ?Skin: ?   Findings: No erythema or rash.  ?Neurological:  ?   Mental Status: She is oriented to person, place, and time.  ?   Cranial Nerves: No cranial nerve deficit.  ?   Motor: No abnormal muscle tone.  ?   Coordination: Coordination abnormal.  ?   Gait: Gait abnormal.  ?   Deep Tendon Reflexes: Reflexes normal.  ?Psychiatric:     ?   Behavior: Behavior normal.     ?   Thought Content: Thought content normal.     ?   Judgment: Judgment normal.  ? ?Antalgic gait ?Using a cane ? ? ?Lab Results  ?Component Value Date  ? WBC 4.2 04/15/2021  ? HGB 16.5 (H) 04/15/2021  ? HCT 50.3 (H) 04/15/2021  ? PLT 176.0 04/15/2021  ? GLUCOSE 104 (H) 04/15/2021  ? CHOL 186 05/22/2019  ? TRIG 268.0 (H) 05/22/2019  ? HDL 57.80 05/22/2019  ? LDLDIRECT 80.0 05/22/2019  ? [place] 42 02/23/2018  ? ALT 17 04/15/2021  ? AST 23 04/15/2021  ? NA 137 04/15/2021  ? K 4.2 04/15/2021  ? CL 98 04/15/2021  ? CREATININE 1.02 04/15/2021  ? BUN 29 (H) 04/15/2021  ? CO2 32 04/15/2021  ? TSH 3.32 04/15/2021  ? INR 0.96 07/24/2017  ? HGBA1C 5.6 05/22/2019  ? ? ?No results found. ? ?Assessment & Plan:  ? ?Problem List Items Addressed This Visit   ? ? Callus of foot  ?  B feet pain, calluses - not better ?Podiatry consult ?  ?  ? Relevant Orders  ? Ambulatory referral to Podiatry  ? Chronic pain of both feet - Primary  ?  B feet  pain, calluses - not better ?Podiatry consult ?Cont w/Tylenol and Gabapentin ?  ?  ?  Relevant Orders  ? Ambulatory referral to Podiatry  ? Low back pain  ?  Gabapentin is helping ?Get a grocery cart for home ?  ?  ?  ? ? ?No orders of the defined types were placed in this encounter. ?  ? ? ?Follow-up: Return in about 2 months (around 06/20/2021) for a follow-up visit. ? ?Walker Kehr, MD ?

## 2021-04-20 NOTE — Assessment & Plan Note (Addendum)
B feet pain, calluses - not better ?Podiatry consult ?Cont w/Tylenol and Gabapentin ?

## 2021-04-20 NOTE — Patient Instructions (Signed)
Get a grocery cart for home ?

## 2021-04-29 ENCOUNTER — Other Ambulatory Visit: Payer: Self-pay

## 2021-04-29 ENCOUNTER — Encounter (INDEPENDENT_AMBULATORY_CARE_PROVIDER_SITE_OTHER): Payer: Self-pay | Admitting: Ophthalmology

## 2021-04-29 ENCOUNTER — Ambulatory Visit (INDEPENDENT_AMBULATORY_CARE_PROVIDER_SITE_OTHER): Payer: Medicare Other | Admitting: Ophthalmology

## 2021-04-29 DIAGNOSIS — H35021 Exudative retinopathy, right eye: Secondary | ICD-10-CM

## 2021-04-29 DIAGNOSIS — H353134 Nonexudative age-related macular degeneration, bilateral, advanced atrophic with subfoveal involvement: Secondary | ICD-10-CM

## 2021-04-29 DIAGNOSIS — H43813 Vitreous degeneration, bilateral: Secondary | ICD-10-CM

## 2021-04-29 DIAGNOSIS — H3561 Retinal hemorrhage, right eye: Secondary | ICD-10-CM | POA: Diagnosis not present

## 2021-04-29 NOTE — Assessment & Plan Note (Signed)
Accounts for acuity OU ?

## 2021-04-29 NOTE — Assessment & Plan Note (Signed)
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 ? ?Successful inducement of regression of PECHR now into the subretinal fibrosis and hemorrhage has waned ?

## 2021-04-29 NOTE — Assessment & Plan Note (Signed)
Incidental, no holes ?

## 2021-04-29 NOTE — Progress Notes (Signed)
? ? ?04/29/2021 ? ?  ? ?CHIEF COMPLAINT ?Patient presents for  ?Chief Complaint  ?Patient presents with  ? Retinal Hemorrhage  ? ? ? ? ?HISTORY OF PRESENT ILLNESS: ?Gina [name] is a 88 y.o. female who presents to the clinic today for:  ? ?HPI   ?4 mos fu ou oct fp ?Pt denies floaters and FOL. ?Pt states no vision changes and medical history has no changes. ? ? ? ?Last edited by Silvestre Moment on 04/29/2021  1:22 PM.  ?  ? ? ?Referring physician: ?Plotnikov, Evie Lacks, MD ?[place],  [place] [address] ? ?HISTORICAL INFORMATION:  ? ?Selected notes from the [facility] ?  ? ?Lab Results  ?Component Value Date  ? HGBA1C 5.6 05/22/2019  ?  ? ?CURRENT MEDICATIONS: ?No current outpatient medications on file. (Ophthalmic Drugs)  ? ?No current facility-administered medications for this visit. (Ophthalmic Drugs)  ? ?Current Outpatient Medications (Other)  ?Medication Sig  ? acetaminophen (TYLENOL) 500 MG tablet Take 1,000 mg by mouth daily. 4 tabs daily  ? allopurinol (ZYLOPRIM) 100 MG tablet Take 1 tablet (100 mg total) by mouth daily.  ? amLODipine (NORVASC) 5 MG tablet Take 1 tablet (5 mg total) by mouth daily.  ? atorvastatin (LIPITOR) 40 MG tablet Take 1 tablet (40 mg total) by mouth every evening.  ? Calcium Carbonate (CALCIUM 600 PO) Take 600 mg by mouth 2 (two) times daily.  ? cetirizine (ZYRTEC) 10 MG tablet Take 1 tablet (10 mg total) by mouth 2 (two) times daily.  ? clopidogrel (PLAVIX) 75 MG tablet Take 1 tablet (75 mg total) by mouth daily.  ? Fluticasone-Umeclidin-Vilant (TRELEGY ELLIPTA) 100-62.5-25 MCG/INH AEPB Inhale 1 puff into the lungs daily.  ? furosemide (LASIX) 40 MG tablet Take 1 tablet (40 mg total) by mouth every other day.  ? gabapentin (NEURONTIN) 400 MG capsule Take 1 capsule (400 mg total) by mouth 4 (four) times daily.  ? ketoconazole (NIZORAL) 2 % cream as needed.  ? losartan (COZAAR) 50 MG tablet Take 1 tablet (50 mg total) by mouth daily.  ? mometasone (NASONEX) 50 MCG/ACT  nasal spray Place 2 sprays into the nose daily.  ? Multiple Vitamin (MULTIVITAMIN WITH MINERALS) TABS tablet Take 1 tablet by mouth daily.  ? Multiple Vitamins-Minerals (PRESERVISION AREDS 2 PO) Take 1 tablet by mouth daily.  ? saccharomyces boulardii (FLORASTOR) 250 MG capsule Take 250 mg by mouth daily.  ? triamterene-hydrochlorothiazide (MAXZIDE-25) 37.5-25 MG tablet Take 1 tablet by mouth daily.  ? vitamin C (ASCORBIC ACID) 500 MG tablet Take 500 mg by mouth every evening.  ? ?No current facility-administered medications for this visit. (Other)  ? ? ? ? ?REVIEW OF SYSTEMS: ?ROS   ?Negative for: Constitutional, Gastrointestinal, Neurological, Skin, Genitourinary, Musculoskeletal, HENT, Endocrine, Cardiovascular, Eyes, Respiratory, Psychiatric, Allergic/Imm, Heme/Lymph ?Last edited by Silvestre Moment on 04/29/2021  1:22 PM.  ?  ? ? ? ?ALLERGIES ?Allergies  ?Allergen Reactions  ? Penicillins Hives  ?  Has patient had a PCN reaction causing immediate rash, facial/tongue/throat swelling, SOB or lightheadedness with hypotension:## yes ## ?Has patient had a PCN reaction causing severe rash involving mucus membranes or skin necrosis: no ?Has patient had a PCN reaction that required hospitalization: no ?Has patient had a PCN reaction occurring within the last 10 years: no ?If all of the above answers are "NO", then may proceed with Cephalosporin use. ?  ? Aspirin Hives  ? Enalapril Cough  ?  wheezing  ? Adhesive [Tape]  Other (See Comments)  ?  "pulls skin off"  ? ? ?PAST MEDICAL HISTORY ?Past Medical History:  ?Diagnosis Date  ? Allergic rhinitis   ? seasonal  ? Arthritis   ? Colon cancer [facility]) Dec 12  ? Diverticulosis of colon (without mention of hemorrhage)   ? GI bleed   ? had cyst removed from spine....went back on plavix to soon  ? Gout   ? Hyperlipidemia   ? Hyperplastic colon polyp   ? Hypertension   ? Macular degeneration   ? PAD (peripheral artery disease) ([place])   ? Pneumonia   ? back in 05/2017  ? Stroke [facility])   ? TIA   10/2009  ? Unspecified transient cerebral ischemia sept '11  ? ?Past Surgical History:  ?Procedure Laterality Date  ? ABDOMINAL AORTOGRAM N/A 06/16/2017  ? Procedure: ABDOMINAL AORTOGRAM;  Surgeon: Angelia Mould, MD;  Location: [place] [facility];  Service: Cardiovascular;  Laterality: N/A;  ? APPENDECTOMY    ? BACK SURGERY  April '12  ? benign cyst spine [facility])  ? BREAST LUMPECTOMY    ? right breast '86  ? COLON SURGERY  Dec '12  ? colectomy for colon cancer ([place])  ? ENDARTERECTOMY FEMORAL Bilateral 08/02/2017  ? ENDARTERECTOMY FEMORAL Bilateral 08/02/2017  ? Procedure: Bilateral Femoral ENDARTERECTOMY with Patch Angioplasty;  Surgeon: Rosetta Posner, MD;  Location: [facility] OR;  Service: Vascular;  Laterality: Bilateral;  ? EYE SURGERY    ? bilateral cataracts  ? LOWER EXTREMITY ANGIOGRAPHY N/A 06/16/2017  ? Procedure: LOWER EXTREMITY ANGIOGRAPHY;  Surgeon: Angelia Mould, MD;  Location: [place] [facility];  Service: Cardiovascular;  Laterality: N/A;  ? PERIPHERAL VASCULAR INTERVENTION  06/16/2017  ? Procedure: PERIPHERAL VASCULAR INTERVENTION;  Surgeon: Angelia Mould, MD;  Location: [place] [facility];  Service: Cardiovascular;;  left common iliac  ? TONSILLECTOMY    ? ? ?FAMILY HISTORY ?Family History  ?Problem Relation Age of Onset  ? Colon cancer Father   ? Hypertension Father   ? Hyperlipidemia Father   ? Coronary artery disease Father   ? Ulcers Father   ? Heart failure Mother   ? Coronary artery disease Mother   ? Breast cancer Mother   ? Hypertension Mother   ? Hyperlipidemia Mother   ? Asthma Mother   ? Diabetes Neg Hx   ? ? ?SOCIAL HISTORY ?Social History  ? ?Tobacco Use  ? Smoking status: Former  ?  Packs/day: 1.50  ?  Years: 30.00  ?  Pack years: 45.00  ?  Types: Cigarettes  ?  Quit date: 07/21/1987  ?  Years since quitting: 33.7  ? Smokeless tobacco: Never  ?Vaping Use  ? Vaping Use: Never used  ?Substance Use Topics  ? Alcohol use: Yes  ?  Alcohol/week: 14.0 standard drinks  ?  Types:  14 Glasses of wine per week  ?  Comment: Wine daily  ? Drug use: No  ? ?  ? ?  ? ?OPHTHALMIC EXAM: ? ?Base Eye Exam   ? ? Visual Acuity (ETDRS)   ? ?   Right Left  ? Dist cc 20/150   ? Dist ph cc NI   ? ? Correction: Glasses  ? ?  ?  ? ? Tonometry (Tonopen, 1:29 PM)   ? ?   Right Left  ? Pressure 11 11  ? ?  ?  ? ? Pupils   ? ?   Dark Light Shape React APD  ? Right  5 4 Round Brisk None  ? Left 5 4 Round Brisk None  ? ?  ?  ? ? Visual Fields   ? ?   Left Right  ?  Full Full  ? ?  ?  ? ? Extraocular Movement   ? ?   Right Left  ?  Full Full  ? ?  ?  ? ? Neuro/Psych   ? ? Oriented x3: Yes  ? Mood/Affect: Normal  ? ?  ?  ? ? Dilation   ? ? Both eyes: 1.0% Mydriacyl, 2.5% Phenylephrine @ 1:29 PM  ? ?  ?  ? ?  ? ?Slit Lamp and Fundus Exam   ? ? External Exam   ? ?   Right Left  ? External Normal Normal  ? ?  ?  ? ? Slit Lamp Exam   ? ?   Right Left  ? Lids/Lashes Normal Normal  ? Conjunctiva/Sclera White and quiet White and quiet  ? Cornea Clear Clear  ? Anterior Chamber Deep and quiet Deep and quiet  ? Iris Round and reactive Round and reactive  ? Lens Posterior chamber intraocular lens Posterior chamber intraocular lens  ? Anterior Vitreous Normal Normal  ? ?  ?  ? ? Fundus Exam   ? ?   Right Left  ? Posterior Vitreous Posterior vitreous detachment Posterior vitreous detachment  ? Disc Normal Normal  ? C/D Ratio 0.7 0.7  ? Macula Geographic atrophy, 8 DA size Geographic atrophy, 8 DA size  ? Vessels Normal Normal  ? 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 ?  ?  ? ?  ? ? ?IMAGING AND PROCEDURES  ?Imaging and Procedures for 04/29/21 ? ?OCT, Retina - OU - Both Eyes   ? ?   ?Right Eye ?Quality was good.  Scan locations included subfoveal. Central Foveal Thickness: 254. Progression has been stable. Findings include abnormal foveal contour.  ? ?Left Eye ?Quality was good. Scan locations included subfoveal. C

## 2021-05-05 ENCOUNTER — Ambulatory Visit: Payer: Medicare Other | Admitting: Podiatry

## 2021-05-11 ENCOUNTER — Ambulatory Visit (INDEPENDENT_AMBULATORY_CARE_PROVIDER_SITE_OTHER): Payer: Medicare Other | Admitting: Podiatry

## 2021-05-11 ENCOUNTER — Encounter: Payer: Self-pay | Admitting: Podiatry

## 2021-05-11 ENCOUNTER — Other Ambulatory Visit: Payer: Self-pay

## 2021-05-11 DIAGNOSIS — I739 Peripheral vascular disease, unspecified: Secondary | ICD-10-CM | POA: Diagnosis not present

## 2021-05-11 DIAGNOSIS — L84 Corns and callosities: Secondary | ICD-10-CM | POA: Diagnosis not present

## 2021-05-11 NOTE — Progress Notes (Signed)
?  Subjective:  ?Patient ID: Gina [name], female    DOB: [birth year],   MRN: [id number] ? ?Chief Complaint  ?Patient presents with  ? Callouses  ?   ?Callus of foot  ? ? ?88 y.o. female presents for concern of calluses on her feet. Relates they are painful when standing. Does get some swelling around her ankles as well. Denies treatment. She has a history of PAD.  [name] Denies any other pedal complaints. Denies n/v/f/c.  ? ?Past Medical History:  ?Diagnosis Date  ? Allergic rhinitis   ? seasonal  ? Arthritis   ? Colon cancer [facility]) Dec 12  ? Diverticulosis of colon (without mention of hemorrhage)   ? GI bleed   ? had cyst removed from spine....went back on plavix to soon  ? Gout   ? Hyperlipidemia   ? Hyperplastic colon polyp   ? Hypertension   ? Macular degeneration   ? PAD (peripheral artery disease) ([place])   ? Pneumonia   ? back in 05/2017  ? Stroke [facility])   ? TIA  10/2009  ? Unspecified transient cerebral ischemia sept '11  ? ? ?Objective:  ?Physical Exam: ?Vascular: DP/PT pulses 1/4 bilateral. CFT <3 seconds. Diminished hair growth noted to digits with edema bilateral.  ?Skin. No lacerations or abrasions bilateral feet. Hyperkeratotic lesions noted sub first metatarsal and fifth metatarsals bilateral.  ?Musculoskeletal: MMT 5/5 bilateral lower extremities in DF, PF, Inversion and Eversion. Deceased ROM in DF of ankle joint.  ?Neurological: Sensation intact to light touch.  ? ?Assessment:  ? ?1. Pre-ulcerative calluses   ?2. PAD (peripheral artery disease) ([place])   ? ? ? ?Plan:  ?Patient was evaluated and treated and all questions answered. ?-Discussed corns and calluses with patient and treatment options.  ?-Hyperkeratotic tissue was debrided with chisel without incident.  ?-Encouraged daily moisturizing ?-Discussed use of pumice stone ?-Advised good supportive shoes and inserts ?-Patient to return to office as needed or sooner if condition worsens for at risk foot care.  ? ? ?Lorenda Peck, DPM  ? ? ?

## 2021-06-09 DIAGNOSIS — D485 Neoplasm of uncertain behavior of skin: Secondary | ICD-10-CM | POA: Diagnosis not present

## 2021-06-09 DIAGNOSIS — C44319 Basal cell carcinoma of skin of other parts of face: Secondary | ICD-10-CM | POA: Diagnosis not present

## 2021-06-09 DIAGNOSIS — L578 Other skin changes due to chronic exposure to nonionizing radiation: Secondary | ICD-10-CM | POA: Diagnosis not present

## 2021-06-09 DIAGNOSIS — D225 Melanocytic nevi of trunk: Secondary | ICD-10-CM | POA: Diagnosis not present

## 2021-06-09 DIAGNOSIS — Z1283 Encounter for screening for malignant neoplasm of skin: Secondary | ICD-10-CM | POA: Diagnosis not present

## 2021-06-22 ENCOUNTER — Ambulatory Visit: Payer: Medicare Other | Admitting: Internal Medicine

## 2021-06-23 DIAGNOSIS — D225 Melanocytic nevi of trunk: Secondary | ICD-10-CM | POA: Diagnosis not present

## 2021-06-23 DIAGNOSIS — D485 Neoplasm of uncertain behavior of skin: Secondary | ICD-10-CM | POA: Diagnosis not present

## 2021-07-09 DIAGNOSIS — C44319 Basal cell carcinoma of skin of other parts of face: Secondary | ICD-10-CM | POA: Diagnosis not present

## 2021-07-09 DIAGNOSIS — L928 Other granulomatous disorders of the skin and subcutaneous tissue: Secondary | ICD-10-CM | POA: Diagnosis not present

## 2021-07-10 DIAGNOSIS — M7061 Trochanteric bursitis, right hip: Secondary | ICD-10-CM | POA: Diagnosis not present

## 2021-07-13 ENCOUNTER — Ambulatory Visit (INDEPENDENT_AMBULATORY_CARE_PROVIDER_SITE_OTHER): Payer: Medicare Other | Admitting: Podiatry

## 2021-07-13 ENCOUNTER — Encounter: Payer: Self-pay | Admitting: Podiatry

## 2021-07-13 DIAGNOSIS — L84 Corns and callosities: Secondary | ICD-10-CM | POA: Diagnosis not present

## 2021-07-13 DIAGNOSIS — I739 Peripheral vascular disease, unspecified: Secondary | ICD-10-CM

## 2021-07-13 NOTE — Progress Notes (Signed)
  Subjective:  Patient ID: Gina [name], female    DOB: 03/24/1931,   MRN: [id number]  Chief Complaint  Patient presents with   Callouses    Trim calluses    88 y.o. female presents for concern of calluses on her feet. Relates they are painful when standing. Hoping to have them trimmed today. Denies treatment. She has a history of PAD.  [name] Denies any other pedal complaints. Denies n/v/f/c.   Past Medical History:  Diagnosis Date   Allergic rhinitis    seasonal   Arthritis    Colon cancer [facility]) Dec 12   Diverticulosis of colon (without mention of hemorrhage)    GI bleed    had cyst removed from spine....went back on plavix to soon   Gout    Hyperlipidemia    Hyperplastic colon polyp    Hypertension    Macular degeneration    PAD (peripheral artery disease) ([place])    Pneumonia    back in 05/2017   Stroke [facility])    TIA  10/2009   Unspecified transient cerebral ischemia sept '11    Objective:  Physical Exam: Vascular: DP/PT pulses 1/4 bilateral. CFT <3 seconds. Diminished hair growth noted to digits with edema bilateral.  Skin. No lacerations or abrasions bilateral feet. Hyperkeratotic lesions noted sub first metatarsal and fifth metatarsals bilateral.  Musculoskeletal: MMT 5/5 bilateral lower extremities in DF, PF, Inversion and Eversion. Deceased ROM in DF of ankle joint.  Neurological: Sensation intact to light touch.   Assessment:   1. PAD (peripheral artery disease) ([place])   2. Pre-ulcerative calluses       Plan:  Patient was evaluated and treated and all questions answered. -Discussed corns and calluses with patient and treatment options.  -Hyperkeratotic tissue was debrided with chisel with small incident to right fifth metatarsal covered with neosporin and bandaid.   -Encouraged daily moisturizing -Discussed use of pumice stone -Advised good supportive shoes and inserts -Patient to return to office in 61 days for recheck    Lorenda Peck, DPM

## 2021-07-19 ENCOUNTER — Ambulatory Visit (INDEPENDENT_AMBULATORY_CARE_PROVIDER_SITE_OTHER): Payer: Medicare Other

## 2021-07-19 DIAGNOSIS — Z0000 Encounter for general adult medical examination without abnormal findings: Secondary | ICD-10-CM | POA: Diagnosis not present

## 2021-07-19 NOTE — Progress Notes (Cosign Needed Addendum)
Subjective:   Gina [name] is a 88 y.o. female who presents for Medicare Annual (Subsequent) preventive examination.   I connected with [facility]  today by telephone and verified that I am speaking with the correct person using two identifiers. Location patient: home Location provider: work Persons participating in the virtual visit: patient, provider.   I discussed the limitations, risks, security and privacy concerns of performing an evaluation and management service by telephone and the availability of in person appointments. I also discussed with the patient that there may be a patient responsible charge related to this service. The patient expressed understanding and verbally consented to this telephonic visit.    Interactive audio and video telecommunications were attempted between this provider and patient, however failed, due to patient having technical difficulties OR patient did not have access to video capability.  We continued and completed visit with audio only.    Review of Systems     Cardiac Risk Factors include: advanced age (>3men, >41 women)     Objective:    Today's Vitals   There is no height or weight on file to calculate BMI.     07/19/2021    2:43 PM 07/14/2020   12:55 PM 02/22/2018    2:25 PM 10/31/2017   10:49 AM 08/03/2017    9:00 AM 08/02/2017   10:18 AM 07/24/2017   10:06 AM  Advanced Directives  Does Patient Have a Medical Advance Directive? Yes Yes No Yes Yes Yes Yes  Type of Paramedic of [place];Living will Living will;Healthcare Power of [place];Living will Living will [place];Living will   Does patient want to make changes to medical advance directive?  No - Patient declined   No - Patient declined    Copy of [place] in Chart? No - copy requested No - copy requested    No - copy requested   Would patient like information on creating a  medical advance directive?   No - Patient declined        Current Medications (verified) Outpatient Encounter Medications as of 07/19/2021  Medication Sig   acetaminophen (TYLENOL) 500 MG tablet Take 1,000 mg by mouth daily. 4 tabs daily   allopurinol (ZYLOPRIM) 100 MG tablet Take 1 tablet (100 mg total) by mouth daily.   amLODipine (NORVASC) 5 MG tablet Take 1 tablet (5 mg total) by mouth daily.   atorvastatin (LIPITOR) 40 MG tablet Take 1 tablet (40 mg total) by mouth every evening.   Calcium Carbonate (CALCIUM 600 PO) Take 600 mg by mouth 2 (two) times daily.   cetirizine (ZYRTEC) 10 MG tablet Take 1 tablet (10 mg total) by mouth 2 (two) times daily.   clopidogrel (PLAVIX) 75 MG tablet Take 1 tablet (75 mg total) by mouth daily.   Fluticasone-Umeclidin-Vilant (TRELEGY ELLIPTA) 100-62.5-25 MCG/INH AEPB Inhale 1 puff into the lungs daily.   furosemide (LASIX) 40 MG tablet Take 1 tablet (40 mg total) by mouth every other day.   gabapentin (NEURONTIN) 400 MG capsule Take 1 capsule (400 mg total) by mouth 4 (four) times daily.   ketoconazole (NIZORAL) 2 % cream as needed.   losartan (COZAAR) 50 MG tablet Take 1 tablet (50 mg total) by mouth daily.   mometasone (NASONEX) 50 MCG/ACT nasal spray Place 2 sprays into the nose daily.   Multiple Vitamin (MULTIVITAMIN WITH MINERALS) TABS tablet Take 1 tablet by mouth daily.   Multiple Vitamins-Minerals (PRESERVISION  AREDS 2 PO) Take 1 tablet by mouth daily.   saccharomyces boulardii (FLORASTOR) 250 MG capsule Take 250 mg by mouth daily.   triamterene-hydrochlorothiazide (MAXZIDE-25) 37.5-25 MG tablet Take 1 tablet by mouth daily.   vitamin C (ASCORBIC ACID) 500 MG tablet Take 500 mg by mouth every evening.   No facility-administered encounter medications on file as of 07/19/2021.    Allergies (verified) Penicillins, Aspirin, Enalapril, and Adhesive [tape]   History: Past Medical History:  Diagnosis Date   Allergic rhinitis    seasonal    Arthritis    Colon cancer [facility]) Dec 12   Diverticulosis of colon (without mention of hemorrhage)    GI bleed    had cyst removed from spine....went back on plavix to soon   Gout    Hyperlipidemia    Hyperplastic colon polyp    Hypertension    Macular degeneration    PAD (peripheral artery disease) ([place])    Pneumonia    back in 05/2017   Stroke [facility])    TIA  10/2009   Unspecified transient cerebral ischemia sept '11   Past Surgical History:  Procedure Laterality Date   ABDOMINAL AORTOGRAM N/A 06/16/2017   Procedure: ABDOMINAL AORTOGRAM;  Surgeon: Angelia Mould, MD;  Location: [place] [facility];  Service: Cardiovascular;  Laterality: N/A;   APPENDECTOMY     BACK SURGERY  April '12   benign cyst spine ([place])   BREAST LUMPECTOMY     right breast '86   COLON SURGERY  Dec '12   colectomy for colon cancer ([place])   ENDARTERECTOMY FEMORAL Bilateral 08/02/2017   ENDARTERECTOMY FEMORAL Bilateral 08/02/2017   Procedure: Bilateral Femoral ENDARTERECTOMY with Patch Angioplasty;  Surgeon: Rosetta Posner, MD;  Location: [place];  Service: Vascular;  Laterality: Bilateral;   EYE SURGERY     bilateral cataracts   LOWER EXTREMITY ANGIOGRAPHY N/A 06/16/2017   Procedure: LOWER EXTREMITY ANGIOGRAPHY;  Surgeon: Angelia Mould, MD;  Location: [place] [facility];  Service: Cardiovascular;  Laterality: N/A;   PERIPHERAL VASCULAR INTERVENTION  06/16/2017   Procedure: PERIPHERAL VASCULAR INTERVENTION;  Surgeon: Angelia Mould, MD;  Location: [place] [facility];  Service: Cardiovascular;;  left common iliac   TONSILLECTOMY     Family History  Problem Relation Age of Onset   Colon cancer Father    Hypertension Father    Hyperlipidemia Father    Coronary artery disease Father    Ulcers Father    Heart failure Mother    Coronary artery disease Mother    Breast cancer Mother    Hypertension Mother    Hyperlipidemia Mother    Asthma Mother    Diabetes Neg Hx    Social History    Socioeconomic History   Marital status: Widowed    Spouse name: Not on file   Number of children: 3   Years of education: 14   Highest education level: Not on file  Occupational History   Occupation: Tree surgeon: RETIRED    Comment: retired  Tobacco Use   Smoking status: Former    Packs/day: 1.50    Years: 30.00    Pack years: 45.00    Types: Cigarettes    Quit date: 07/21/1987    Years since quitting: 34.0   Smokeless tobacco: Never  Vaping Use   Vaping Use: Never used  Substance and Sexual Activity   Alcohol use: Yes    Alcohol/week: 14.0 standard drinks    Types: 14 Glasses  of wine per week    Comment: Wine daily   Drug use: No   Sexual activity: Not Currently  Other Topics Concern   Not on file  Social History Narrative   HSG, Married 1955 widowed 2010. 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 Passed away 07-13-08 Her son  moved to [place] (July '10). End of life care:  no extra ordinary measures: wants cardiac resusitation and mechanical ventilation short-term if needed. Does not want to be kept in a persistant vegative state or long term artifical life support.    Social Determinants of Health   Financial Resource Strain: Low Risk    Difficulty of Paying Living Expenses: Not hard at all  Food Insecurity: No Food Insecurity   Worried About Charity fundraiser in the Last Year: Never true   [place] in the Last Year: Never true  Transportation Needs: No Transportation Needs   Lack of Transportation (Medical): No   Lack of Transportation (Non-Medical): No  Physical Activity: Inactive   Days of Exercise per Week: 0 days   Minutes of Exercise per Session: 0 min  Stress: No Stress Concern Present   Feeling of Stress : Not at all  Social Connections: Moderately Integrated    Frequency of Communication with Friends and Family: Three times a week   Frequency of Social Gatherings with Friends and Family: Three times a week   Attends Religious Services: More than 4 times per year   Active Member of Clubs or Organizations: Yes   Attends Archivist Meetings: More than 4 times per year   Marital Status: Widowed    Tobacco Counseling Counseling given: Not Answered   Clinical Intake:  Pre-visit preparation completed: Yes  Pain : No/denies pain     Nutritional Risks: None Diabetes: No  How often do you need to have someone help you when you read instructions, pamphlets, or other written materials from your doctor or pharmacy?: 1 - Never What is the last grade level you completed in school?: High School  Diabetic?no  Interpreter Needed?: No  Information entered by :: [place] of Daily Living    07/19/2021    2:43 PM  In your present state of health, do you have any difficulty performing the following activities:  Hearing? 0  Vision? 0  Difficulty concentrating or making decisions? 0  Walking or climbing stairs? 0  Dressing or bathing? 0  Doing errands, shopping? 0  Preparing Food and eating ? N  Using the Toilet? N  In the past six months, have you accidently leaked urine? N  Do you have problems with loss of bowel control? N  Managing your Medications? N  Managing your Finances? N  Housekeeping or managing your Housekeeping? N    Patient Care Team: Plotnikov, Evie Lacks, MD as PCP - General (Internal Medicine) Vickie Epley, MD as PCP - Electrophysiology (Cardiology) Donnie Mesa, MD (General Surgery) Lafayette Dragon, MD (Inactive) (Gastroenterology) Clent Jacks, MD (Ophthalmology) Zadie Rhine Clent Demark, MD (Ophthalmology) Jovita Gamma, MD (Neurosurgery) Allyn Kenner, MD (Dermatology) Brien Few, MD (Obstetrics and Gynecology) Dorna Leitz, MD (Orthopedic Surgery) Wyatt Portela, MD as Consulting  Physician (Hematology and Oncology)  Indicate any recent Medical Services you may have received from other than [facility] providers in the past year (date may be  approximate).     Assessment:   This is a routine wellness examination for [place].  Hearing/Vision screen Vision Screening - Comments:: Annual eye exams wear glasses   Dietary issues and exercise activities discussed: Current Exercise Habits: The patient does not participate in regular exercise at present, Exercise limited by: orthopedic condition(s)   Goals Addressed   None    Depression Screen    07/19/2021    2:43 PM 07/19/2021    2:41 PM 07/14/2020    1:02 PM 10/01/2019   11:30 AM 10/01/2018   11:24 AM 09/04/2017   10:02 AM 08/15/2016   10:42 AM  PHQ 2/9 Scores  PHQ - 2 Score 0 0 0 0 0 0 0    Fall Risk    07/19/2021    2:44 PM 07/14/2020    1:09 PM 07/14/2020   12:57 PM 10/01/2019   11:30 AM 10/01/2018   11:24 AM  Fall Risk   Falls in the past year? 0 0 0 1 0  Number falls in past yr: 0 0 0 0 0  Injury with Fall? 0 0 0 0 0  Risk for fall due to :  No Fall Risks No Fall Risks    Follow up Falls evaluation completed;Education provided Falls evaluation completed Falls evaluation completed      [place]:  Any stairs in or around the home? Yes  If so, are there any without handrails? No  Home free of loose throw rugs in walkways, pet beds, electrical cords, etc? Yes  Adequate lighting in your home to reduce risk of falls? Yes   ASSISTIVE DEVICES UTILIZED TO PREVENT FALLS:  Life alert? No  Use of a cane, walker or w/c? No  Grab bars in the bathroom? Yes  Shower chair or bench in shower? Yes  Elevated toilet seat or a handicapped toilet? Yes     Cognitive Function:    Normal cognitive status assessed by telephone conversation  by this Nurse Health Advisor. No abnormalities found.      Immunizations Immunization History  Administered Date(s) Administered   Fluad Quad(high Dose  65+) 11/20/2019   Influenza Split 11/22/2010, 12/06/2011   Influenza Whole 11/09/2007, 11/25/2008, 11/14/2009   Influenza, High Dose Seasonal PF 11/23/2015, 11/20/2017, 11/28/2018   Influenza,inj,Quad PF,6+ Mos 11/07/2012, 11/09/2013, 11/07/2016   Influenza-Unspecified 11/15/2014   PFIZER(Purple Top)SARS-COV-2 Vaccination 03/30/2019, 04/24/2019, 12/06/2019   Pneumococcal Conjugate-13 09/06/2013   Pneumococcal Polysaccharide-23 12/15/2001, 08/10/2015   Td 01/12/2006   Tdap 08/15/2016   Zoster, Live 01/12/2006    TDAP status: Up to date  Flu Vaccine status: Up to date  Pneumococcal vaccine status: Up to date  Covid-19 vaccine status: Completed vaccines  Qualifies for Shingles Vaccine? Yes   Zostavax completed No   Shingrix Completed?: No.    Education has been provided regarding the importance of this vaccine. Patient has been advised to call insurance company to determine out of pocket expense if they have not yet received this vaccine. Advised may also receive vaccine at local pharmacy or Health Dept. Verbalized acceptance and understanding.  Screening Tests Health Maintenance  Topic Date Due   Zoster Vaccines- Shingrix (1 of 2) Never done   COLONOSCOPY (Pts 45-55yrs Insurance coverage will need to be confirmed)  12/06/2019   COVID-19 Vaccine (4 - Booster for Pfizer series) 01/31/2020   INFLUENZA VACCINE  09/14/2021   TETANUS/TDAP  08/16/2026   Pneumonia Vaccine 56+ Years old  Completed   DEXA SCAN  Completed  HPV VACCINES  Aged Out    Health Maintenance  Health Maintenance Due  Topic Date Due   Zoster Vaccines- Shingrix (1 of 2) Never done   COLONOSCOPY (Pts 45-63yrs Insurance coverage will need to be confirmed)  12/06/2019   COVID-19 Vaccine (4 - Booster for Pfizer series) 01/31/2020    Colorectal cancer screening: No longer required.   Mammogram status: No longer required due to age.  Bone Density status: Completed 10/16/2018. Results reflect: Bone density  results: OSTEOPENIA. Repeat every 5 years.  Lung Cancer Screening: (Low Dose CT Chest recommended if Age 93-80 years, 30 pack-year currently smoking OR have quit w/in 15years.) does not qualify.   Lung Cancer Screening Referral: n/a  Additional Screening:  Hepatitis C Screening: does not qualify;   Vision Screening: Recommended annual ophthalmology exams for early detection of glaucoma and other disorders of the eye. Is the patient up to date with their annual eye exam?  Yes  Who is the provider or what is the name of the office in which the patient attends annual eye exams? Dr.Groat  If pt is not established with a provider, would they like to be referred to a provider to establish care? No .   Dental Screening: Recommended annual dental exams for proper oral hygiene  Community Resource Referral / Chronic Care Management: CRR required this visit?  No   CCM required this visit?  No      Plan:     I have personally reviewed and noted the following in the patient's chart:   Medical and social history Use of alcohol, tobacco or illicit drugs  Current medications and supplements including opioid prescriptions.  Functional ability and status Nutritional status Physical activity Advanced directives List of other physicians Hospitalizations, surgeries, and ER visits in previous 12 months Vitals Screenings to include cognitive, depression, and falls Referrals and appointments  In addition, I have reviewed and discussed with patient certain preventive protocols, quality metrics, and best practice recommendations. A written personalized care plan for preventive services as well as general preventive health recommendations were provided to patient.     Randel Pigg, LPN   08/19/8830   Nurse Notes: none    Medical screening examination/treatment/procedure(s) were performed by non-physician practitioner and as supervising physician I was immediately available for  consultation/collaboration.  I agree with above. Lew Dawes, MD

## 2021-07-19 NOTE — Patient Instructions (Signed)
Ms. Gina [name] , Thank you for taking time to come for your Medicare Wellness Visit. I appreciate your ongoing commitment to your health goals. Please review the following plan we discussed and let me know if I can assist you in the future.   Screening recommendations/referrals: Colonoscopy: no longer required  Mammogram: no longer required  Bone Density: 10/16/2018 Recommended yearly ophthalmology/optometry visit for glaucoma screening and checkup Recommended yearly dental visit for hygiene and checkup  Vaccinations: Influenza vaccine: completed  Pneumococcal vaccine: completed  Tdap vaccine: 08/15/2016 Shingles vaccine: will consider     Advanced directives: none   Conditions/risks identified: none   Next appointment: none    Preventive Care 110 Years and Older, Female Preventive care refers to lifestyle choices and visits with your health care provider that can promote health and wellness. What does preventive care include? A yearly physical exam. This is also called an annual well check. Dental exams once or twice a year. Routine eye exams. Ask your health care provider how often you should have your eyes checked. Personal lifestyle choices, including: Daily care of your teeth and gums. Regular physical activity. Eating a healthy diet. Avoiding tobacco and drug use. Limiting alcohol use. Practicing safe sex. Taking low-dose aspirin every day. Taking vitamin and mineral supplements as recommended by your health care provider. What happens during an annual well check? The services and screenings done by your health care provider during your annual well check will depend on your age, overall health, lifestyle risk factors, and family history of disease. Counseling  Your health care provider may ask you questions about your: Alcohol use. Tobacco use. Drug use. Emotional well-being. Home and relationship well-being. Sexual activity. Eating habits. History of  falls. Memory and ability to understand (cognition). Work and work Statistician. Reproductive health. Screening  You may have the following tests or measurements: Height, weight, and BMI. Blood pressure. Lipid and cholesterol levels. These may be checked every 5 years, or more frequently if you are over 43 years old. Skin check. Lung cancer screening. You may have this screening every year starting at age 67 if you have a 30-pack-year history of smoking and currently smoke or have quit within the past 15 years. Fecal occult blood test (FOBT) of the stool. You may have this test every year starting at age 44. Flexible sigmoidoscopy or colonoscopy. You may have a sigmoidoscopy every 5 years or a colonoscopy every 10 years starting at age 58. Hepatitis C blood test. Hepatitis B blood test. Sexually transmitted disease (STD) testing. Diabetes screening. This is done by checking your blood sugar (glucose) after you have not eaten for a while (fasting). You may have this done every 1-3 years. Bone density scan. This is done to screen for osteoporosis. You may have this done starting at age 24. Mammogram. This may be done every 1-2 years. Talk to your health care provider about how often you should have regular mammograms. Talk with your health care provider about your test results, treatment options, and if necessary, the need for more tests. Vaccines  Your health care provider may recommend certain vaccines, such as: Influenza vaccine. This is recommended every year. Tetanus, diphtheria, and acellular pertussis (Tdap, Td) vaccine. You may need a Td booster every 10 years. Zoster vaccine. You may need this after age 41. Pneumococcal 13-valent conjugate (PCV13) vaccine. One dose is recommended after age 26. Pneumococcal polysaccharide (PPSV23) vaccine. One dose is recommended after age 32. Talk to your health care provider about which screenings and  vaccines you need and how often you need  them. This information is not intended to replace advice given to you by your health care provider. Make sure you discuss any questions you have with your health care provider. Document Released: 02/27/2015 Document Revised: 10/21/2015 Document Reviewed: 12/02/2014 Elsevier Interactive Patient Education  2017 [place] Prevention in the Home Falls can cause injuries. They can happen to people of all ages. There are many things you can do to make your home safe and to help prevent falls. What can I do on the outside of my home? Regularly fix the edges of walkways and driveways and fix any cracks. Remove anything that might make you trip as you walk through a door, such as a raised step or threshold. Trim any bushes or trees on the path to your home. Use bright outdoor lighting. Clear any walking paths of anything that might make someone trip, such as rocks or tools. Regularly check to see if handrails are loose or broken. Make sure that both sides of any steps have handrails. Any raised decks and porches should have guardrails on the edges. Have any leaves, snow, or ice cleared regularly. Use sand or salt on walking paths during winter. Clean up any spills in your garage right away. This includes oil or grease spills. What can I do in the bathroom? Use night lights. Install grab bars by the toilet and in the tub and shower. Do not use towel bars as grab bars. Use non-skid mats or decals in the tub or shower. If you need to sit down in the shower, use a plastic, non-slip stool. Keep the floor dry. Clean up any water that spills on the floor as soon as it happens. Remove soap buildup in the tub or shower regularly. Attach bath mats securely with double-sided non-slip rug tape. Do not have throw rugs and other things on the floor that can make you trip. What can I do in the bedroom? Use night lights. Make sure that you have a light by your bed that is easy to reach. Do not use  any sheets or blankets that are too big for your bed. They should not hang down onto the floor. Have a firm chair that has side arms. You can use this for support while you get dressed. Do not have throw rugs and other things on the floor that can make you trip. What can I do in the kitchen? Clean up any spills right away. Avoid walking on wet floors. Keep items that you use a lot in easy-to-reach places. If you need to reach something above you, use a strong step stool that has a grab bar. Keep electrical cords out of the way. Do not use floor polish or wax that makes floors slippery. If you must use wax, use non-skid floor wax. Do not have throw rugs and other things on the floor that can make you trip. What can I do with my stairs? Do not leave any items on the stairs. Make sure that there are handrails on both sides of the stairs and use them. Fix handrails that are broken or loose. Make sure that handrails are as long as the stairways. Check any carpeting to make sure that it is firmly attached to the stairs. Fix any carpet that is loose or worn. Avoid having throw rugs at the top or bottom of the stairs. If you do have throw rugs, attach them to the floor with carpet tape. Make  sure that you have a light switch at the top of the stairs and the bottom of the stairs. If you do not have them, ask someone to add them for you. What else can I do to help prevent falls? Wear shoes that: Do not have high heels. Have rubber bottoms. Are comfortable and fit you well. Are closed at the toe. Do not wear sandals. If you use a stepladder: Make sure that it is fully opened. Do not climb a closed stepladder. Make sure that both sides of the stepladder are locked into place. Ask someone to hold it for you, if possible. Clearly mark and make sure that you can see: Any grab bars or handrails. First and last steps. Where the edge of each step is. Use tools that help you move around (mobility aids)  if they are needed. These include: Canes. Walkers. Scooters. Crutches. Turn on the lights when you go into a dark area. Replace any light bulbs as soon as they burn out. Set up your furniture so you have a clear path. Avoid moving your furniture around. If any of your floors are uneven, fix them. If there are any pets around you, be aware of where they are. Review your medicines with your doctor. Some medicines can make you feel dizzy. This can increase your chance of falling. Ask your doctor what other things that you can do to help prevent falls. This information is not intended to replace advice given to you by your health care provider. Make sure you discuss any questions you have with your health care provider. Document Released: 11/27/2008 Document Revised: 07/09/2015 Document Reviewed: 03/07/2014 Elsevier Interactive Patient Education  2017 [employer].

## 2021-07-20 ENCOUNTER — Encounter: Payer: Self-pay | Admitting: Internal Medicine

## 2021-07-20 ENCOUNTER — Ambulatory Visit (INDEPENDENT_AMBULATORY_CARE_PROVIDER_SITE_OTHER): Payer: Medicare Other | Admitting: Internal Medicine

## 2021-07-20 DIAGNOSIS — R062 Wheezing: Secondary | ICD-10-CM | POA: Diagnosis not present

## 2021-07-20 DIAGNOSIS — E785 Hyperlipidemia, unspecified: Secondary | ICD-10-CM | POA: Diagnosis not present

## 2021-07-20 DIAGNOSIS — R0609 Other forms of dyspnea: Secondary | ICD-10-CM | POA: Diagnosis not present

## 2021-07-20 DIAGNOSIS — I10 Essential (primary) hypertension: Secondary | ICD-10-CM | POA: Diagnosis not present

## 2021-07-20 DIAGNOSIS — M7061 Trochanteric bursitis, right hip: Secondary | ICD-10-CM | POA: Diagnosis not present

## 2021-07-20 DIAGNOSIS — I509 Heart failure, unspecified: Secondary | ICD-10-CM | POA: Insufficient documentation

## 2021-07-20 DIAGNOSIS — I5022 Chronic systolic (congestive) heart failure: Secondary | ICD-10-CM

## 2021-07-20 NOTE — Assessment & Plan Note (Signed)
Cont on Norvasc, Triamt/HCTZ, Losartan

## 2021-07-20 NOTE — Assessment & Plan Note (Signed)
Cont on Atorvastatin 40 mg

## 2021-07-20 NOTE — Assessment & Plan Note (Signed)
Use Furosemide qod O2 sat 88% on RA Home O2 is offered, pt declined

## 2021-07-20 NOTE — Assessment & Plan Note (Signed)
O2 sat 88% on RA at rest

## 2021-07-20 NOTE — Progress Notes (Signed)
Subjective:  Patient ID: Gina [name], female    DOB: [birth year]  Age: 88 y.o. MRN: [id number]  CC: 37mo follow up   HPI [facility] presents for R hip pain, OA, LBP O2 sat 88% on RA  Outpatient Medications Prior to Visit  Medication Sig Dispense Refill   acetaminophen (TYLENOL) 500 MG tablet Take 1,000 mg by mouth daily. 4 tabs daily     allopurinol (ZYLOPRIM) 100 MG tablet Take 1 tablet (100 mg total) by mouth daily. 90 tablet 3   amLODipine (NORVASC) 5 MG tablet Take 1 tablet (5 mg total) by mouth daily. 90 tablet 3   atorvastatin (LIPITOR) 40 MG tablet Take 1 tablet (40 mg total) by mouth every evening. 90 tablet 3   Calcium Carbonate (CALCIUM 600 PO) Take 600 mg by mouth 2 (two) times daily.     cetirizine (ZYRTEC) 10 MG tablet Take 1 tablet (10 mg total) by mouth 2 (two) times daily. 90 tablet 3   clopidogrel (PLAVIX) 75 MG tablet Take 1 tablet (75 mg total) by mouth daily. 90 tablet 3   Fluticasone-Umeclidin-Vilant (TRELEGY ELLIPTA) 100-62.5-25 MCG/INH AEPB Inhale 1 puff into the lungs daily. 3 each 3   furosemide (LASIX) 40 MG tablet Take 1 tablet (40 mg total) by mouth every other day. 30 tablet 5   gabapentin (NEURONTIN) 400 MG capsule Take 1 capsule (400 mg total) by mouth 4 (four) times daily. 360 capsule 3   ketoconazole (NIZORAL) 2 % cream as needed.     losartan (COZAAR) 50 MG tablet Take 1 tablet (50 mg total) by mouth daily. 90 tablet 3   mometasone (NASONEX) 50 MCG/ACT nasal spray Place 2 sprays into the nose daily. 17 g 5   Multiple Vitamin (MULTIVITAMIN WITH MINERALS) TABS tablet Take 1 tablet by mouth daily.     Multiple Vitamins-Minerals (PRESERVISION AREDS 2 PO) Take 1 tablet by mouth daily.     saccharomyces boulardii (FLORASTOR) 250 MG capsule Take 250 mg by mouth daily.     triamterene-hydrochlorothiazide (MAXZIDE-25) 37.5-25 MG tablet Take 1 tablet by mouth daily. 90 tablet 3   vitamin C (ASCORBIC ACID) 500 MG tablet Take 500 mg by mouth every  evening.     No facility-administered medications prior to visit.    ROS: Review of Systems  Constitutional:  Positive for fatigue. Negative for activity change, appetite change, chills and unexpected weight change.  HENT:  Negative for congestion, mouth sores and sinus pressure.   Eyes:  Negative for visual disturbance.  Respiratory:  Positive for shortness of breath. Negative for cough and chest tightness.   Gastrointestinal:  Negative for abdominal pain and nausea.  Genitourinary:  Negative for difficulty urinating, frequency and vaginal pain.  Musculoskeletal:  Positive for arthralgias and back pain.  Skin:  Positive for wound. Negative for pallor and rash.  Neurological:  Positive for weakness. Negative for dizziness, tremors, numbness and headaches.  Psychiatric/Behavioral:  Negative for confusion and sleep disturbance.    Objective:  BP (!) 142/70 (BP Location: Left Arm, Patient Position: Sitting, Cuff Size: Large)   Pulse 92   Temp 98.2 F (36.8 C) (Oral)   Ht 5' (1.524 m)   Wt 189 lb (85.7 kg)   BMI 36.91 kg/m   BP Readings from Last 3 Encounters:  07/20/21 (!) 142/70  04/20/21 140/62  01/19/21 140/60    Wt Readings from Last 3 Encounters:  07/20/21 189 lb (85.7 kg)  04/20/21 187 lb (84.8 kg)  01/19/21 184  lb (83.5 kg)    Physical Exam Constitutional:      General: She is not in acute distress.    Appearance: She is well-developed. She is obese.  HENT:     Head: Normocephalic.     Right Ear: External ear normal.     Left Ear: External ear normal.     Nose: Nose normal.  Eyes:     General:        Right eye: No discharge.        Left eye: No discharge.     Conjunctiva/sclera: Conjunctivae normal.     Pupils: Pupils are equal, round, and reactive to light.  Neck:     Thyroid: No thyromegaly.     Vascular: No JVD.     Trachea: No tracheal deviation.  Cardiovascular:     Rate and Rhythm: Normal rate and regular rhythm.     Heart sounds: Normal heart  sounds.  Pulmonary:     Effort: No respiratory distress.     Breath sounds: No stridor. No wheezing.  Abdominal:     General: Bowel sounds are normal. There is no distension.     Palpations: Abdomen is soft. There is no mass.     Tenderness: There is no abdominal tenderness. There is no guarding or rebound.  Musculoskeletal:        General: Tenderness present.     Cervical back: Normal range of motion and neck supple. No rigidity.  Lymphadenopathy:     Cervical: No cervical adenopathy.  Skin:    Findings: No erythema or rash.  Neurological:     Mental Status: She is oriented to person, place, and time.     Cranial Nerves: No cranial nerve deficit.     Motor: No abnormal muscle tone.     Coordination: Coordination abnormal.     Gait: Gait abnormal.     Deep Tendon Reflexes: Reflexes normal.  Psychiatric:        Behavior: Behavior normal.        Thought Content: Thought content normal.        Judgment: Judgment normal.   Bx wound on back - healing Trace edema - LEs Using a cane   Lab Results  Component Value Date   WBC 4.2 04/15/2021   HGB 16.5 (H) 04/15/2021   HCT 50.3 (H) 04/15/2021   PLT 176.0 04/15/2021   GLUCOSE 104 (H) 04/15/2021   CHOL 186 05/22/2019   TRIG 268.0 (H) 05/22/2019   HDL 57.80 05/22/2019   LDLDIRECT 80.0 05/22/2019   LDLCALC 42 02/23/2018   ALT 17 04/15/2021   AST 23 04/15/2021   NA 137 04/15/2021   K 4.2 04/15/2021   CL 98 04/15/2021   CREATININE 1.02 04/15/2021   BUN 29 (H) 04/15/2021   CO2 32 04/15/2021   TSH 3.32 04/15/2021   INR 0.96 07/24/2017   HGBA1C 5.6 05/22/2019    No results found.  Assessment & Plan:   Problem List Items Addressed This Visit     CHF (congestive heart failure) (HCC)    Use Furosemide qod O2 sat 88% on RA Home O2 is offered, pt declined      DOE (dyspnea on exertion)    O2 sat 88% on RA at rest       Relevant Orders   CBC with Differential/Platelet   Comprehensive metabolic panel   Dyslipidemia     Cont on Atorvastatin 40 mg      Essential hypertension    Cont  on Norvasc, Triamt/HCTZ, Losartan      Relevant Orders   CBC with Differential/Platelet   Comprehensive metabolic panel   Wheezing    SOB, runny nose - much better on Trelegy and Furosemide. CHF on CXR Doing more, less tired Use Furosemide qod O2 sat 88% on RA Home O2 is offered, pt declined         No orders of the defined types were placed in this encounter.     Follow-up: Return in about 3 months (around 10/20/2021) for a follow-up visit.  Walker Kehr, MD

## 2021-07-20 NOTE — Assessment & Plan Note (Addendum)
SOB, runny nose - much better on Trelegy and Furosemide. CHF on CXR Doing more, less tired Use Furosemide qod O2 sat 88% on RA Home O2 is offered, pt declined

## 2021-08-10 ENCOUNTER — Telehealth: Payer: Self-pay

## 2021-08-10 NOTE — Telephone Encounter (Signed)
Pt is having issues with her legs. Pt is wanting to know if she should see Dr.Todd  Early (Vascular surgeon) or if she should come to see Dr. Posey Rea.  Pt states her circulation is not good.  Pt CB [phone]  Please advise

## 2021-08-12 NOTE — Telephone Encounter (Signed)
Dr. Donnetta Hutching.  See me if the appointment with Dr. Donnetta Hutching is delayed. Thanks,

## 2021-08-12 NOTE — Telephone Encounter (Signed)
Spoke with pt and was able to inform her of Dr.Plotnikov's instructions. °

## 2021-08-20 DIAGNOSIS — Z85828 Personal history of other malignant neoplasm of skin: Secondary | ICD-10-CM | POA: Diagnosis not present

## 2021-08-20 DIAGNOSIS — Z08 Encounter for follow-up examination after completed treatment for malignant neoplasm: Secondary | ICD-10-CM | POA: Diagnosis not present

## 2021-08-20 DIAGNOSIS — X32XXXD Exposure to sunlight, subsequent encounter: Secondary | ICD-10-CM | POA: Diagnosis not present

## 2021-08-20 DIAGNOSIS — L570 Actinic keratosis: Secondary | ICD-10-CM | POA: Diagnosis not present

## 2021-08-31 ENCOUNTER — Other Ambulatory Visit: Payer: Self-pay | Admitting: Internal Medicine

## 2021-09-01 ENCOUNTER — Telehealth: Payer: Self-pay | Admitting: Internal Medicine

## 2021-09-01 NOTE — Telephone Encounter (Signed)
Pt is requesting a callback to discuss her next steps. Pt stated the diuretic is not working and she is still retaining a lot of fluid in her legs, and she would like to know what Dr. Alain Marion would like her to do next.    CB: [phone]

## 2021-09-02 MED ORDER — TORSEMIDE 20 MG PO TABS: 20.0000 mg | ORAL_TABLET | Freq: Every day | ORAL | 5 refills | Status: DC

## 2021-09-02 NOTE — Telephone Encounter (Signed)
Noted D/c Furosemide Start Torsemide 20-40 mg/d - Rx emailed RTC 4 wks Thx

## 2021-09-02 NOTE — Telephone Encounter (Signed)
Notified pt w/MD response.../lmb 

## 2021-09-15 ENCOUNTER — Encounter: Payer: Self-pay | Admitting: Podiatry

## 2021-09-15 ENCOUNTER — Other Ambulatory Visit (INDEPENDENT_AMBULATORY_CARE_PROVIDER_SITE_OTHER): Payer: Medicare Other

## 2021-09-15 ENCOUNTER — Ambulatory Visit (INDEPENDENT_AMBULATORY_CARE_PROVIDER_SITE_OTHER): Payer: Medicare Other | Admitting: Podiatry

## 2021-09-15 DIAGNOSIS — R0609 Other forms of dyspnea: Secondary | ICD-10-CM

## 2021-09-15 DIAGNOSIS — I739 Peripheral vascular disease, unspecified: Secondary | ICD-10-CM

## 2021-09-15 DIAGNOSIS — I10 Essential (primary) hypertension: Secondary | ICD-10-CM | POA: Diagnosis not present

## 2021-09-15 DIAGNOSIS — L84 Corns and callosities: Secondary | ICD-10-CM

## 2021-09-15 LAB — COMPREHENSIVE METABOLIC PANEL
ALT: 21 U/L (ref 0–35)
AST: 29 U/L (ref 0–37)
Albumin: 4.2 g/dL (ref 3.5–5.2)
Alkaline Phosphatase: 62 U/L (ref 39–117)
BUN: 52 mg/dL — ABNORMAL HIGH (ref 6–23)
CO2: 34 mEq/L — ABNORMAL HIGH (ref 19–32)
Calcium: 10.3 mg/dL (ref 8.4–10.5)
Chloride: 95 mEq/L — ABNORMAL LOW (ref 96–112)
Creatinine, Ser: 1.27 mg/dL — ABNORMAL HIGH (ref 0.40–1.20)
GFR: 37.83 mL/min — ABNORMAL LOW (ref >60.00)
Glucose, Bld: 105 mg/dL — ABNORMAL HIGH (ref 70–99)
Potassium: 3.9 mEq/L (ref 3.5–5.1)
Sodium: 138 mEq/L (ref 135–145)
Total Bilirubin: 0.9 mg/dL (ref 0.2–1.2)
Total Protein: 7.3 g/dL (ref 6.0–8.3)

## 2021-09-15 LAB — CBC WITH DIFFERENTIAL/PLATELET
Basophils Absolute: 0 10*3/uL (ref 0.0–0.1)
Basophils Relative: 0.8 % (ref 0.0–3.0)
Eosinophils Absolute: 0.1 10*3/uL (ref 0.0–0.7)
Eosinophils Relative: 3.1 % (ref 0.0–5.0)
HCT: 49.8 % — ABNORMAL HIGH (ref 36.0–46.0)
Hemoglobin: 16.4 g/dL — ABNORMAL HIGH (ref 12.0–15.0)
Lymphocytes Relative: 26.8 % (ref 12.0–46.0)
Lymphs Abs: 1.1 10*3/uL (ref 0.7–4.0)
MCHC: 33 g/dL (ref 30.0–36.0)
MCV: 106.9 fl — ABNORMAL HIGH (ref 78.0–100.0)
Monocytes Absolute: 0.6 10*3/uL (ref 0.1–1.0)
Monocytes Relative: 13.9 % — ABNORMAL HIGH (ref 3.0–12.0)
Neutro Abs: 2.3 10*3/uL (ref 1.4–7.7)
Neutrophils Relative %: 55.4 % (ref 43.0–77.0)
Platelets: 177 10*3/uL (ref 150.0–400.0)
RBC: 4.66 Mil/uL (ref 3.87–5.11)
RDW: 16.5 % — ABNORMAL HIGH (ref 11.5–15.5)
WBC: 4.1 10*3/uL (ref 4.0–10.5)

## 2021-09-15 NOTE — Progress Notes (Signed)
  Subjective:  Patient ID: Gina [name], female    DOB: 1931-10-10,   MRN: [id number]  Chief Complaint  Patient presents with   Nail Problem     Routine foot care     88 y.o. female presents for concern of calluses on her feet. Relates they are painful when standing. Hoping to have them trimmed today. Denies treatment. She has a history of PAD.  [name] Denies any other pedal complaints. Denies n/v/f/c.   Past Medical History:  Diagnosis Date   Allergic rhinitis    seasonal   Arthritis    Colon cancer [facility]) Dec 12   Diverticulosis of colon (without mention of hemorrhage)    GI bleed    had cyst removed from spine....went back on plavix to soon   Gout    Hyperlipidemia    Hyperplastic colon polyp    Hypertension    Macular degeneration    PAD (peripheral artery disease) ([place])    Pneumonia    back in 05/2017   Stroke [facility])    TIA  10/2009   Unspecified transient cerebral ischemia sept '11    Objective:  Physical Exam: Vascular: DP/PT pulses 1/4 bilateral. CFT <3 seconds. Diminished hair growth noted to digits with edema bilateral.  Skin. No lacerations or abrasions bilateral feet. Hyperkeratotic lesions noted sub first metatarsal and fifth metatarsals bilateral.  Musculoskeletal: MMT 5/5 bilateral lower extremities in DF, PF, Inversion and Eversion. Deceased ROM in DF of ankle joint.  Neurological: Sensation intact to light touch.   Assessment:   1. Pre-ulcerative calluses   2. PAD (peripheral artery disease) ([place])       Plan:  Patient was evaluated and treated and all questions answered. -Discussed corns and calluses with patient and treatment options.  -Hyperkeratotic tissue was debrided with chisel with small incident to right fifth metatarsal covered with neosporin and bandaid.   -Encouraged daily moisturizing -Discussed use of pumice stone -Advised good supportive shoes and inserts -Patient to return to office in 61 days for recheck    Lorenda Peck, DPM

## 2021-09-21 ENCOUNTER — Ambulatory Visit (INDEPENDENT_AMBULATORY_CARE_PROVIDER_SITE_OTHER): Payer: Medicare Other | Admitting: Internal Medicine

## 2021-09-21 ENCOUNTER — Encounter: Payer: Self-pay | Admitting: Internal Medicine

## 2021-09-21 DIAGNOSIS — R0609 Other forms of dyspnea: Secondary | ICD-10-CM | POA: Diagnosis not present

## 2021-09-21 DIAGNOSIS — I5022 Chronic systolic (congestive) heart failure: Secondary | ICD-10-CM | POA: Diagnosis not present

## 2021-09-21 DIAGNOSIS — R0902 Hypoxemia: Secondary | ICD-10-CM

## 2021-09-21 DIAGNOSIS — I10 Essential (primary) hypertension: Secondary | ICD-10-CM

## 2021-09-21 NOTE — Progress Notes (Signed)
Subjective:  Patient ID: Gina [name], female    DOB: 06-Jun-1931  Age: 88 y.o. MRN: [id number]  CC: Retaining fluid in both legs   HPI [facility] presents for edema, CHF O2 sat 88-90% at home Pt lost wt - 7 lbs C/o prolapsed bladder  Outpatient Medications Prior to Visit  Medication Sig Dispense Refill   acetaminophen (TYLENOL) 500 MG tablet Take 1,000 mg by mouth daily. 4 tabs daily     allopurinol (ZYLOPRIM) 100 MG tablet TAKE 1 TABLET DAILY 90 tablet 3   amLODipine (NORVASC) 5 MG tablet TAKE 1 TABLET DAILY 90 tablet 3   atorvastatin (LIPITOR) 40 MG tablet Take 1 tablet (40 mg total) by mouth every evening. 90 tablet 3   Calcium Carbonate (CALCIUM 600 PO) Take 600 mg by mouth 2 (two) times daily.     cetirizine (ZYRTEC) 10 MG tablet Take 1 tablet (10 mg total) by mouth 2 (two) times daily. 90 tablet 3   clopidogrel (PLAVIX) 75 MG tablet Take 1 tablet (75 mg total) by mouth daily. 90 tablet 3   Fluticasone-Umeclidin-Vilant (TRELEGY ELLIPTA) 100-62.5-25 MCG/INH AEPB Inhale 1 puff into the lungs daily. 3 each 3   gabapentin (NEURONTIN) 400 MG capsule Take 1 capsule (400 mg total) by mouth 4 (four) times daily. 360 capsule 3   ketoconazole (NIZORAL) 2 % cream as needed.     losartan (COZAAR) 50 MG tablet TAKE 1 TABLET DAILY 90 tablet 3   Multiple Vitamin (MULTIVITAMIN WITH MINERALS) TABS tablet Take 1 tablet by mouth daily.     Multiple Vitamins-Minerals (PRESERVISION AREDS 2 PO) Take 1 tablet by mouth daily.     saccharomyces boulardii (FLORASTOR) 250 MG capsule Take 250 mg by mouth daily.     torsemide (DEMADEX) 20 MG tablet Take 1-2 tablets (20-40 mg total) by mouth daily. 60 tablet 5   triamterene-hydrochlorothiazide (MAXZIDE-25) 37.5-25 MG tablet TAKE 1 TABLET DAILY 90 tablet 3   vitamin C (ASCORBIC ACID) 500 MG tablet Take 500 mg by mouth every evening.     mometasone (NASONEX) 50 MCG/ACT nasal spray Place 2 sprays into the nose daily. (Patient not taking: Reported on  09/21/2021) 17 g 5   mupirocin ointment (BACTROBAN) 2 % Apply topically.     No facility-administered medications prior to visit.    ROS: Review of Systems  Constitutional:  Negative for activity change, appetite change, chills, fatigue and unexpected weight change.  HENT:  Negative for congestion, mouth sores and sinus pressure.   Eyes:  Negative for visual disturbance.  Respiratory:  Positive for shortness of breath. Negative for cough, chest tightness and wheezing.   Cardiovascular:  Positive for leg swelling.  Gastrointestinal:  Negative for abdominal pain and nausea.  Genitourinary:  Negative for difficulty urinating, frequency and vaginal pain.  Musculoskeletal:  Positive for gait problem. Negative for back pain.  Skin:  Negative for pallor and rash.  Neurological:  Negative for dizziness, tremors, weakness, numbness and headaches.  Psychiatric/Behavioral:  Negative for confusion, sleep disturbance and suicidal ideas.     Objective:  BP 122/66 (BP Location: Left Arm, Patient Position: Sitting, Cuff Size: Large)   Pulse 80   Temp 97.7 F (36.5 C) (Oral)   Ht 5\' 1"  (1.549 m)   Wt 183 lb (83 kg)   SpO2 (!) 85%   BMI 34.58 kg/m   BP Readings from Last 3 Encounters:  09/21/21 122/66  07/20/21 (!) 142/70  04/20/21 140/62    Wt Readings from Last  3 Encounters:  09/21/21 183 lb (83 kg)  07/20/21 189 lb (85.7 kg)  04/20/21 187 lb (84.8 kg)    Physical Exam Constitutional:      General: She is not in acute distress.    Appearance: She is well-developed.  HENT:     Head: Normocephalic.     Right Ear: External ear normal.     Left Ear: External ear normal.     Nose: Nose normal.  Eyes:     General:        Right eye: No discharge.        Left eye: No discharge.     Conjunctiva/sclera: Conjunctivae normal.     Pupils: Pupils are equal, round, and reactive to light.  Neck:     Thyroid: No thyromegaly.     Vascular: No JVD.     Trachea: No tracheal deviation.   Cardiovascular:     Rate and Rhythm: Normal rate and regular rhythm.     Heart sounds: Normal heart sounds.  Pulmonary:     Effort: No respiratory distress.     Breath sounds: No stridor. No wheezing.  Abdominal:     General: Bowel sounds are normal. There is no distension.     Palpations: Abdomen is soft. There is no mass.     Tenderness: There is no abdominal tenderness. There is no guarding or rebound.  Musculoskeletal:        General: No tenderness.     Cervical back: Normal range of motion and neck supple. No rigidity.     Right lower leg: Edema present.     Left lower leg: Edema present.  Lymphadenopathy:     Cervical: No cervical adenopathy.  Skin:    Findings: No erythema or rash.  Neurological:     Mental Status: She is oriented to person, place, and time.     Cranial Nerves: No cranial nerve deficit.     Motor: No abnormal muscle tone.     Coordination: Coordination normal.     Deep Tendon Reflexes: Reflexes normal.  Psychiatric:        Behavior: Behavior normal.        Thought Content: Thought content normal.        Judgment: Judgment normal.   1+ edema B  Lab Results  Component Value Date   WBC 4.1 09/15/2021   HGB 16.4 (H) 09/15/2021   HCT 49.8 (H) 09/15/2021   PLT 177.0 09/15/2021   GLUCOSE 105 (H) 09/15/2021   CHOL 186 05/22/2019   TRIG 268.0 (H) 05/22/2019   HDL 57.80 05/22/2019   LDLDIRECT 80.0 05/22/2019   LDLCALC 42 02/23/2018   ALT 21 09/15/2021   AST 29 09/15/2021   NA 138 09/15/2021   K 3.9 09/15/2021   CL 95 (L) 09/15/2021   CREATININE 1.27 (H) 09/15/2021   BUN 52 (H) 09/15/2021   CO2 34 (H) 09/15/2021   TSH 3.32 04/15/2021   INR 0.96 07/24/2017   HGBA1C 5.6 05/22/2019    No results found.  Assessment & Plan:   Problem List Items Addressed This Visit     CHF (congestive heart failure) ([place])    Start on O2      DOE (dyspnea on exertion)    Start on O2 at home      Essential hypertension    BP Readings from Last 3  Encounters:  09/21/21 122/66  07/20/21 (!) 142/70  04/20/21 140/62        Hypoxemia  O2 sat 85% on RA today - 09/21/21 [facility]         No orders of the defined types were placed in this encounter.     Follow-up: Return in about 2 months (around 11/21/2021) for a follow-up visit.  Walker Kehr, MD

## 2021-09-21 NOTE — Assessment & Plan Note (Signed)
Start on O2

## 2021-09-21 NOTE — Assessment & Plan Note (Signed)
BP Readings from Last 3 Encounters:  09/21/21 122/66  07/20/21 (!) 142/70  04/20/21 140/62

## 2021-09-21 NOTE — Assessment & Plan Note (Addendum)
O2 sat 85% on RA today - 09/21/21 [facility]

## 2021-09-21 NOTE — Assessment & Plan Note (Signed)
Start on O2 at home

## 2021-09-28 ENCOUNTER — Telehealth: Payer: Self-pay | Admitting: Internal Medicine

## 2021-09-28 DIAGNOSIS — R0902 Hypoxemia: Secondary | ICD-10-CM

## 2021-09-28 DIAGNOSIS — I5022 Chronic systolic (congestive) heart failure: Secondary | ICD-10-CM

## 2021-09-28 NOTE — Telephone Encounter (Signed)
Pt was seen on 09/21/21. She said Dr. Alain Marion was supposed to submit a referral for a home health aid for her. There is currently no referral. Pt is requesting the home health aid referral.    Please advise.

## 2021-09-29 NOTE — Telephone Encounter (Signed)
Faxed order again to [place] @ [phone] confirm fax # w/ [facility]...[name]

## 2021-09-29 NOTE — Telephone Encounter (Signed)
Pgt called back she states that she does not need home health aide, She has not received the oxygen. I inform here that the order was faxed to [facility], but we can fax it again. They are supposed to call her to get set-up.Marland KitchenChryl Heck

## 2021-09-29 NOTE — Telephone Encounter (Signed)
Sorry.  We talked about a referral for oxygen therapy.  Did she get oxygen? Does she need home health referral as well? Does her insurance cover a home health aide? Let me know Thanks

## 2021-09-29 NOTE — Telephone Encounter (Signed)
Called pt no answer LMOM RTC.../lmb 

## 2021-09-30 NOTE — Telephone Encounter (Signed)
Rec'd call from [facility] w/[facility]. Need new order placed with information. Place order for new oxygen will fax over to [phone].Marland KitchenJohny Chess

## 2021-09-30 NOTE — Addendum Note (Signed)
Addended by: Earnstine Regal on: 09/30/2021 04:16 PM   Modules accepted: Orders

## 2021-10-05 ENCOUNTER — Other Ambulatory Visit: Payer: Self-pay | Admitting: Internal Medicine

## 2021-10-07 ENCOUNTER — Telehealth: Payer: Self-pay | Admitting: *Deleted

## 2021-10-07 NOTE — Telephone Encounter (Signed)
Rec'd paper PA for Mometasone nasal spray. Submitted w/  (Key: [id number]). PA sent to insurance.Marland KitchenJohny Chess

## 2021-10-08 NOTE — Telephone Encounter (Signed)
Please inform the patient.  [name] can use over-the-counter Flonase or tell us what prescription steroid nasal spray is covered by her insurance. Thank you

## 2021-10-08 NOTE — Telephone Encounter (Signed)
Rec'd fax for denial on Mometasone Furosate Nasal Spray. It states your prescription plan does not cover the request medication.Marland KitchenJohny Chess

## 2021-10-13 NOTE — Telephone Encounter (Signed)
Called pt gave her MD response. Pt states she just want to notate that she been talking the trelegy to and she think it is helping with the oxygen, but she doesn't think they will pay for both. Inform pt still see if they will refill w/ oxygen too if not she can always ask what alternative they would cover for trelegy.Marland KitchenJohny Chess

## 2021-10-19 ENCOUNTER — Other Ambulatory Visit: Payer: Self-pay | Admitting: Internal Medicine

## 2021-10-21 ENCOUNTER — Ambulatory Visit: Payer: Medicare Other | Admitting: Internal Medicine

## 2021-11-01 ENCOUNTER — Encounter (INDEPENDENT_AMBULATORY_CARE_PROVIDER_SITE_OTHER): Payer: Self-pay | Admitting: Ophthalmology

## 2021-11-01 ENCOUNTER — Ambulatory Visit (INDEPENDENT_AMBULATORY_CARE_PROVIDER_SITE_OTHER): Payer: Medicare Other | Admitting: Ophthalmology

## 2021-11-01 DIAGNOSIS — H5316 Psychophysical visual disturbances: Secondary | ICD-10-CM | POA: Diagnosis not present

## 2021-11-01 DIAGNOSIS — H353134 Nonexudative age-related macular degeneration, bilateral, advanced atrophic with subfoveal involvement: Secondary | ICD-10-CM

## 2021-11-01 DIAGNOSIS — H3561 Retinal hemorrhage, right eye: Secondary | ICD-10-CM | POA: Diagnosis not present

## 2021-11-01 NOTE — Assessment & Plan Note (Signed)
Completely resolved temporal periphery OD.  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

## 2021-11-01 NOTE — Assessment & Plan Note (Signed)
Secondary to geographic macula atrophy

## 2021-11-01 NOTE — Progress Notes (Signed)
11/01/2021     CHIEF COMPLAINT Patient presents for  Chief Complaint  Patient presents with   Macular Degeneration      HISTORY OF PRESENT ILLNESS: Gina [name] is a 88 y.o. female who presents to the clinic today for:   HPI   Deep retinal hemmorrhage of right eye 6 mths dilate ou color fp oct Pt states her vision has been stable Pt denies any new floaters or FOL Last edited by Morene Rankins, CMA on 11/01/2021  1:39 PM.      Referring physician: Cassandria Anger, MD [place],  [place] [address]  HISTORICAL INFORMATION:   Selected notes from the MEDICAL RECORD NUMBER    Lab Results  Component Value Date   HGBA1C 5.6 05/22/2019     CURRENT MEDICATIONS: No current outpatient medications on file. (Ophthalmic Drugs)   No current facility-administered medications for this visit. (Ophthalmic Drugs)   Current Outpatient Medications (Other)  Medication Sig   acetaminophen (TYLENOL) 500 MG tablet Take 1,000 mg by mouth daily. 4 tabs daily   allopurinol (ZYLOPRIM) 100 MG tablet TAKE 1 TABLET DAILY   amLODipine (NORVASC) 5 MG tablet TAKE 1 TABLET DAILY   atorvastatin (LIPITOR) 40 MG tablet Take 1 tablet (40 mg total) by mouth every evening.   Calcium Carbonate (CALCIUM 600 PO) Take 600 mg by mouth 2 (two) times daily.   cetirizine (ZYRTEC) 10 MG tablet Take 1 tablet (10 mg total) by mouth 2 (two) times daily.   clopidogrel (PLAVIX) 75 MG tablet Take 1 tablet (75 mg total) by mouth daily.   Fluticasone-Umeclidin-Vilant (TRELEGY ELLIPTA) 100-62.5-25 MCG/ACT AEPB USE 1 INHALATION ORALLY    DAILY   gabapentin (NEURONTIN) 400 MG capsule Take 1 capsule (400 mg total) by mouth 4 (four) times daily.   ketoconazole (NIZORAL) 2 % cream as needed.   losartan (COZAAR) 50 MG tablet TAKE 1 TABLET DAILY   mometasone (NASONEX) 50 MCG/ACT nasal spray SHAKE LIQUID AND USE 2 SPRAYS IN EACH NOSTRIL DAILY   Multiple Vitamin (MULTIVITAMIN WITH MINERALS) TABS tablet  Take 1 tablet by mouth daily.   Multiple Vitamins-Minerals (PRESERVISION AREDS 2 PO) Take 1 tablet by mouth daily.   mupirocin ointment (BACTROBAN) 2 % Apply topically.   saccharomyces boulardii (FLORASTOR) 250 MG capsule Take 250 mg by mouth daily.   torsemide (DEMADEX) 20 MG tablet Take 1-2 tablets (20-40 mg total) by mouth daily.   triamterene-hydrochlorothiazide (MAXZIDE-25) 37.5-25 MG tablet TAKE 1 TABLET DAILY   vitamin C (ASCORBIC ACID) 500 MG tablet Take 500 mg by mouth every evening.   No current facility-administered medications for this visit. (Other)      REVIEW OF SYSTEMS: ROS   Negative for: Constitutional, Gastrointestinal, Neurological, Skin, Genitourinary, Musculoskeletal, HENT, Endocrine, Cardiovascular, Eyes, Respiratory, Psychiatric, Allergic/Imm, Heme/Lymph Last edited by Morene Rankins, CMA on 11/01/2021  1:39 PM.       ALLERGIES Allergies  Allergen Reactions   Penicillins Hives    Has patient had a PCN reaction causing immediate rash, facial/tongue/throat swelling, SOB or lightheadedness with hypotension:## yes ## Has patient had a PCN reaction causing severe rash involving mucus membranes or skin necrosis: no Has patient had a PCN reaction that required hospitalization: no Has patient had a PCN reaction occurring within the last 10 years: no If all of the above answers are "NO", then may proceed with Cephalosporin use.    Aspirin Hives   Enalapril Cough    wheezing   Adhesive [Tape]  Other (See Comments)    "pulls skin off"    PAST MEDICAL HISTORY Past Medical History:  Diagnosis Date   Allergic rhinitis    seasonal   Arthritis    Colon cancer [facility]) Dec 12   Diverticulosis of colon (without mention of hemorrhage)    GI bleed    had cyst removed from spine....went back on plavix to soon   Gout    Hyperlipidemia    Hyperplastic colon polyp    Hypertension    Macular degeneration    PAD (peripheral artery disease) ([place])    Pneumonia    back  in 05/2017   Stroke [facility])    TIA  10/2009   Unspecified transient cerebral ischemia sept '11   Past Surgical History:  Procedure Laterality Date   ABDOMINAL AORTOGRAM N/A 06/16/2017   Procedure: ABDOMINAL AORTOGRAM;  Surgeon: Angelia Mould, MD;  Location: [place];  Service: Cardiovascular;  Laterality: N/A;   APPENDECTOMY     BACK SURGERY  April '12   benign cyst spine ([place])   BREAST LUMPECTOMY     right breast '86   COLON SURGERY  Dec '12   colectomy for colon cancer ([place])   ENDARTERECTOMY FEMORAL Bilateral 08/02/2017   ENDARTERECTOMY FEMORAL Bilateral 08/02/2017   Procedure: Bilateral Femoral ENDARTERECTOMY with Patch Angioplasty;  Surgeon: Rosetta Posner, MD;  Location: [facility];  Service: Vascular;  Laterality: Bilateral;   EYE SURGERY     bilateral cataracts   LOWER EXTREMITY ANGIOGRAPHY N/A 06/16/2017   Procedure: LOWER EXTREMITY ANGIOGRAPHY;  Surgeon: Angelia Mould, MD;  Location: [facility];  Service: Cardiovascular;  Laterality: N/A;   PERIPHERAL VASCULAR INTERVENTION  06/16/2017   Procedure: PERIPHERAL VASCULAR INTERVENTION;  Surgeon: Angelia Mould, MD;  Location: [facility];  Service: Cardiovascular;;  left common iliac   TONSILLECTOMY      FAMILY HISTORY Family History  Problem Relation Age of Onset   Colon cancer Father    Hypertension Father    Hyperlipidemia Father    Coronary artery disease Father    Ulcers Father    Heart failure Mother    Coronary artery disease Mother    Breast cancer Mother    Hypertension Mother    Hyperlipidemia Mother    Asthma Mother    Diabetes Neg Hx     SOCIAL HISTORY Social History   Tobacco Use   Smoking status: Former    Packs/day: 1.50    Years: 30.00    Total pack years: 45.00    Types: Cigarettes    Quit date: 07/21/1987    Years since quitting: 34.3   Smokeless tobacco: Never  Vaping Use   Vaping Use: Never used  Substance Use Topics   Alcohol use: Yes     Alcohol/week: 14.0 standard drinks of alcohol    Types: 14 Glasses of wine per week    Comment: Wine daily   Drug use: No         OPHTHALMIC EXAM:  Base Eye Exam     Visual Acuity (ETDRS)       Right Left   Dist cc 20/125 -1 20/200 -2    Correction: Glasses         Tonometry (Tonopen, 1:46 PM)       Right Left   Pressure 6 8         Extraocular Movement       Right Left    Ortho Ortho    -- -- --  --  --  -- -- --   -- -- --  --  --  -- -- --  Neuro/Psych     Oriented x3: Yes   Mood/Affect: Normal         Dilation     Both eyes: 1.0% Mydriacyl, 2.5% Phenylephrine @ 1:41 PM           Slit Lamp and Fundus Exam     External Exam       Right Left   External Normal Normal         Slit Lamp Exam       Right Left   Lids/Lashes Normal Normal   Conjunctiva/Sclera White and quiet White and quiet   Cornea Clear Clear   Anterior Chamber Deep and quiet Deep and quiet   Iris Round and reactive Round and reactive   Lens Posterior chamber intraocular lens Posterior chamber intraocular lens   Anterior Vitreous Normal Normal         Fundus Exam       Right Left   Posterior Vitreous Posterior vitreous detachment Posterior vitreous detachment   Disc Normal Normal   C/D Ratio 0.7 0.7   Macula Geographic atrophy, 8 DA size Geographic atrophy, 8 DA size   Vessels Normal Normal   Periphery Reticular degeneration In larger area 1 clock hour, centered at 7-9 o'clock meridia,  of subretinal hemorrhage likely PECHR, peripheral exudative hemorrhagic chorioretinopathy, with extensive chorioretinal atrophy in the temporal periphery anterior to the equator, good laser demarcation inferotemporal and inferiorly for [facility] Reticular degeneration            IMAGING AND PROCEDURES  Imaging and Procedures for 11/01/21  OCT, Retina - OU - Both Eyes       Right Eye Quality was poor. Progression has been stable.   Left Eye Quality was  poor. Progression has been stable.   Notes Central geographic atrophy     Color Fundus Photography Optos - OU - Both Eyes       Right Eye Progression has worsened. Disc findings include normal observations. Macula : geographic atrophy. Vessels : normal observations.   Left Eye Progression has been stable. Disc findings include normal observations. Macula : geographic atrophy. Vessels : normal observations.   Notes OD with peripheral PECHR, previously treated with peripheral local focal laser to prevent spread but also to induce resolution of PECHR and hemorrhage.  Centered at 8 o'clock position with resolution of extensive hemorrhage from the 730 to 9 o'clock position inferotemporal, now with residual SR white fibrosis.  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 The results of the AREDS 2 study was discussed with the patient. A diet rich in dark leafy green vegetables was advised and specific recommendations were made regarding supplements with AREDS 2 formulation . Control of hypertension and serum cholesterol may slow the disease. Smoking cessation is mandatory to slow the disease and diminish the risk of progressing to wet age related macular degeneration. The patient was instructed in the use of an [place] and was told to return immediately for any changes in the Grid. Stressed to the patient do not rub eyes  Two forms of dry ARMD exist,  one with drusen deposits, yellowish deposits with no obvious atrophy,  ATROPHIC which is the loss of normal blood  supply to the choroid (the orange or copper green color layer of the outer retina), which is seen as large areas of atrophy which grow over time to decrease vision.  This atrophic form of dry ARMD is treatable as of Fall 2023.  The treatment consists of injection into the affected eye monthly at first, in attempt to slow the growth of the atrophy. The first approved medication is SYFOVRE (pegcetacoplan). OU with extensive geographic atrophy not a good candidate at this time for Syfovre.  risk and benefits reviewed.     Deep retinal hemorrhage of right eye Completely resolved temporal periphery OD.  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      ICD-10-CM   1. Deep retinal hemorrhage of right eye  H35.61 OCT, Retina - OU - Both Eyes    Color Fundus Photography Optos - OU - Both Eyes    2. Sherran Needs syndrome  H53.16     3. Advanced nonexudative age-related macular degeneration of both eyes with subfoveal involvement  H35.3134       OU with central geographic atrophy.  2.  We will continue to observe follow-up promptly if new visual acuity declines or distortions develop  3.  Ophthalmic Meds Ordered this visit:  No orders of the defined types were placed in this encounter.      Return in about 1 year (around 11/02/2022) for DILATE OU, COLOR FP, OCT.  There are no Patient Instructions on file for this visit.   Explained the diagnoses, plan, and follow up with the patient and they expressed understanding.  Patient expressed understanding of the importance of proper follow up care.   Clent Demark Nelline Lio M.D. Diseases & Surgery of the Retina and Vitreous Retina & Diabetic [place] 11/01/21     Abbreviations: M myopia (nearsighted); A astigmatism; H hyperopia (farsighted); P presbyopia; Mrx spectacle prescription;  CTL contact lenses; OD right eye; OS left eye; OU both eyes  XT exotropia; ET esotropia; PEK punctate epithelial  keratitis; PEE punctate epithelial erosions; DES dry eye syndrome; MGD meibomian gland dysfunction; ATs artificial tears; PFAT's preservative free artificial tears; [place] nuclear sclerotic cataract; PSC posterior subcapsular cataract; ERM epi-retinal membrane; PVD posterior vitreous detachment; RD retinal detachment; DM diabetes mellitus; DR diabetic retinopathy; NPDR non-proliferative diabetic retinopathy; PDR proliferative diabetic retinopathy; CSME clinically significant macular edema; DME diabetic macular edema; dbh dot blot hemorrhages; CWS cotton wool spot; POAG primary open angle glaucoma; C/D cup-to-disc ratio; HVF humphrey visual field; GVF goldmann visual field; OCT optical coherence tomography; IOP intraocular pressure; BRVO Branch retinal vein occlusion; CRVO central retinal vein occlusion; CRAO central retinal artery occlusion; BRAO branch retinal artery occlusion; RT retinal tear; SB scleral buckle; PPV pars plana vitrectomy; VH Vitreous hemorrhage; PRP panretinal laser photocoagulation; IVK intravitreal kenalog; VMT vitreomacular traction; MH Macular hole;  NVD neovascularization of the disc; NVE neovascularization elsewhere; AREDS age related eye disease study; ARMD age related macular degeneration; POAG primary open angle glaucoma; EBMD epithelial/anterior basement membrane dystrophy; ACIOL anterior chamber intraocular lens; IOL intraocular lens; PCIOL posterior chamber intraocular lens; Phaco/IOL phacoemulsification with intraocular lens placement; [place] photorefractive keratectomy; LASIK laser assisted in situ keratomileusis; HTN hypertension; DM diabetes mellitus; COPD chronic obstructive pulmonary disease

## 2021-11-01 NOTE — Assessment & Plan Note (Signed)
The nature of dry age related macular degeneration was discussed with the patient as well as its possible conversion to wet. The results of the AREDS 2 study was discussed with the patient. A diet rich in dark leafy green vegetables was advised and specific recommendations were made regarding supplements with AREDS 2 formulation . Control of hypertension and serum cholesterol may slow the disease. Smoking cessation is mandatory to slow the disease and diminish the risk of progressing to wet age related macular degeneration. The patient was instructed in the use of an [place] and was told to return immediately for any changes in the Grid. Stressed to the patient do not rub eyes  Two forms of dry ARMD exist, one with drusen deposits, yellowish deposits with no obvious atrophy,  ATROPHIC which is the loss of normal blood supply to the choroid (the orange or copper green color layer of the outer retina), which is seen as large areas of atrophy which grow over time to decrease vision.  This atrophic form of dry ARMD is treatable as of Fall 2023.  The treatment consists of injection into the affected eye monthly at first, in attempt to slow the growth of the atrophy. The first approved medication is SYFOVRE (pegcetacoplan). OU with extensive geographic atrophy not a good candidate at this time for Syfovre.  risk and benefits reviewed.

## 2021-11-16 ENCOUNTER — Encounter: Payer: Self-pay | Admitting: Internal Medicine

## 2021-11-16 ENCOUNTER — Ambulatory Visit (INDEPENDENT_AMBULATORY_CARE_PROVIDER_SITE_OTHER): Payer: Medicare Other | Admitting: Internal Medicine

## 2021-11-16 VITALS — BP 122/62 | HR 93 | Temp 98.5°F | Ht 61.0 in | Wt 176.0 lb

## 2021-11-16 DIAGNOSIS — J300 Vasomotor rhinitis: Secondary | ICD-10-CM | POA: Insufficient documentation

## 2021-11-16 DIAGNOSIS — R0609 Other forms of dyspnea: Secondary | ICD-10-CM

## 2021-11-16 DIAGNOSIS — Z23 Encounter for immunization: Secondary | ICD-10-CM

## 2021-11-16 DIAGNOSIS — W1800XA Striking against unspecified object with subsequent fall, initial encounter: Secondary | ICD-10-CM | POA: Diagnosis not present

## 2021-11-16 DIAGNOSIS — I5022 Chronic systolic (congestive) heart failure: Secondary | ICD-10-CM

## 2021-11-16 DIAGNOSIS — R270 Ataxia, unspecified: Secondary | ICD-10-CM | POA: Diagnosis not present

## 2021-11-16 DIAGNOSIS — R0902 Hypoxemia: Secondary | ICD-10-CM

## 2021-11-16 LAB — COMPREHENSIVE METABOLIC PANEL
ALT: 15 U/L (ref 0–35)
AST: 24 U/L (ref 0–37)
Albumin: 4.2 g/dL (ref 3.5–5.2)
Alkaline Phosphatase: 59 U/L (ref 39–117)
BUN: 40 mg/dL — ABNORMAL HIGH (ref 6–23)
CO2: 33 mEq/L — ABNORMAL HIGH (ref 19–32)
Calcium: 11.2 mg/dL — ABNORMAL HIGH (ref 8.4–10.5)
Chloride: 99 mEq/L (ref 96–112)
Creatinine, Ser: 1.22 mg/dL — ABNORMAL HIGH (ref 0.40–1.20)
GFR: 39.65 mL/min — ABNORMAL LOW (ref >60.00)
Glucose, Bld: 103 mg/dL — ABNORMAL HIGH (ref 70–99)
Potassium: 4.4 mEq/L (ref 3.5–5.1)
Sodium: 141 mEq/L (ref 135–145)
Total Bilirubin: 1.3 mg/dL — ABNORMAL HIGH (ref 0.2–1.2)
Total Protein: 7.5 g/dL (ref 6.0–8.3)

## 2021-11-16 LAB — CBC WITH DIFFERENTIAL/PLATELET
Basophils Absolute: 0 10*3/uL (ref 0.0–0.1)
Basophils Relative: 0.8 % (ref 0.0–3.0)
Eosinophils Absolute: 0.2 10*3/uL (ref 0.0–0.7)
Eosinophils Relative: 3.9 % (ref 0.0–5.0)
HCT: 51.9 % — ABNORMAL HIGH (ref 36.0–46.0)
Hemoglobin: 17.1 g/dL — ABNORMAL HIGH (ref 12.0–15.0)
Lymphocytes Relative: 29.8 % (ref 12.0–46.0)
Lymphs Abs: 1.3 10*3/uL (ref 0.7–4.0)
MCHC: 33 g/dL (ref 30.0–36.0)
MCV: 106.7 fl — ABNORMAL HIGH (ref 78.0–100.0)
Monocytes Absolute: 0.6 10*3/uL (ref 0.1–1.0)
Monocytes Relative: 12.6 % — ABNORMAL HIGH (ref 3.0–12.0)
Neutro Abs: 2.4 10*3/uL (ref 1.4–7.7)
Neutrophils Relative %: 52.9 % (ref 43.0–77.0)
Platelets: 139 10*3/uL — ABNORMAL LOW (ref 150.0–400.0)
RBC: 4.86 Mil/uL (ref 3.87–5.11)
RDW: 16 % — ABNORMAL HIGH (ref 11.5–15.5)
WBC: 4.5 10*3/uL (ref 4.0–10.5)

## 2021-11-16 LAB — TSH: TSH: 1.81 u[IU]/mL (ref 0.35–5.50)

## 2021-11-16 MED ORDER — IPRATROPIUM BROMIDE 0.06 % NA SOLN: 2.0000 | Freq: Three times a day (TID) | NASAL | 3 refills | Status: DC

## 2021-11-16 NOTE — Progress Notes (Signed)
Subjective:  Patient ID: Gina [name], female    DOB: [birth year]  Age: 88 y.o. MRN: [id number]  CC: Follow-up (2 month f/u- Flu shot)   HPI [name] presents for a fall 2 wks ago at [employer] - using a walker now F/u on CHF, hypoxia - better w/O2 at night C/o mucus in the throat   Outpatient Medications Prior to Visit  Medication Sig Dispense Refill   acetaminophen (TYLENOL) 500 MG tablet Take 1,000 mg by mouth daily. 4 tabs daily     allopurinol (ZYLOPRIM) 100 MG tablet TAKE 1 TABLET DAILY 90 tablet 3   amLODipine (NORVASC) 5 MG tablet TAKE 1 TABLET DAILY 90 tablet 3   atorvastatin (LIPITOR) 40 MG tablet Take 1 tablet (40 mg total) by mouth every evening. 90 tablet 3   Calcium Carbonate (CALCIUM 600 PO) Take 600 mg by mouth 2 (two) times daily.     cetirizine (ZYRTEC) 10 MG tablet Take 1 tablet (10 mg total) by mouth 2 (two) times daily. 90 tablet 3   clopidogrel (PLAVIX) 75 MG tablet Take 1 tablet (75 mg total) by mouth daily. 90 tablet 3   Fluticasone-Umeclidin-Vilant (TRELEGY ELLIPTA) 100-62.5-25 MCG/ACT AEPB USE 1 INHALATION ORALLY    DAILY 180 each 3   gabapentin (NEURONTIN) 400 MG capsule Take 1 capsule (400 mg total) by mouth 4 (four) times daily. 360 capsule 3   ketoconazole (NIZORAL) 2 % cream as needed.     losartan (COZAAR) 50 MG tablet TAKE 1 TABLET DAILY 90 tablet 3   mometasone (NASONEX) 50 MCG/ACT nasal spray SHAKE LIQUID AND USE 2 SPRAYS IN EACH NOSTRIL DAILY 17 g 5   Multiple Vitamin (MULTIVITAMIN WITH MINERALS) TABS tablet Take 1 tablet by mouth daily.     Multiple Vitamins-Minerals (PRESERVISION AREDS 2 PO) Take 1 tablet by mouth daily.     mupirocin ointment (BACTROBAN) 2 % Apply topically.     saccharomyces boulardii (FLORASTOR) 250 MG capsule Take 250 mg by mouth daily.     torsemide (DEMADEX) 20 MG tablet Take 1-2 tablets (20-40 mg total) by mouth daily. 60 tablet 5   triamterene-hydrochlorothiazide (MAXZIDE-25) 37.5-25 MG tablet TAKE 1 TABLET DAILY 90  tablet 3   vitamin C (ASCORBIC ACID) 500 MG tablet Take 500 mg by mouth every evening.     No facility-administered medications prior to visit.    ROS: Review of Systems  Constitutional:  Negative for activity change, appetite change, chills, fatigue and unexpected weight change.  HENT:  Positive for congestion. Negative for mouth sores and sinus pressure.   Eyes:  Negative for visual disturbance.  Respiratory:  Positive for shortness of breath. Negative for cough and chest tightness.   Cardiovascular:  Positive for leg swelling.  Gastrointestinal:  Negative for abdominal pain and nausea.  Genitourinary:  Negative for difficulty urinating, frequency and vaginal pain.  Musculoskeletal:  Positive for arthralgias and gait problem. Negative for back pain.  Skin:  Negative for pallor and rash.  Neurological:  Negative for dizziness, tremors, weakness, numbness and headaches.  Psychiatric/Behavioral:  Negative for confusion and sleep disturbance.     Objective:  BP 122/62 (BP Location: Left Arm)   Pulse 93   Temp 98.5 F (36.9 C) (Oral)   Ht 5\' 1"  (1.549 m)   Wt 176 lb (79.8 kg)   SpO2 92%   BMI 33.25 kg/m   BP Readings from Last 3 Encounters:  11/16/21 122/62  09/21/21 122/66  07/20/21 (!) 142/70  Wt Readings from Last 3 Encounters:  11/16/21 176 lb (79.8 kg)  09/21/21 183 lb (83 kg)  07/20/21 189 lb (85.7 kg)    Physical Exam Constitutional:      General: She is not in acute distress.    Appearance: She is well-developed. She is obese.  HENT:     Head: Normocephalic.     Right Ear: External ear normal.     Left Ear: External ear normal.     Nose: Nose normal.  Eyes:     General:        Right eye: No discharge.        Left eye: No discharge.     Conjunctiva/sclera: Conjunctivae normal.     Pupils: Pupils are equal, round, and reactive to light.  Neck:     Thyroid: No thyromegaly.     Vascular: No JVD.     Trachea: No tracheal deviation.  Cardiovascular:      Rate and Rhythm: Normal rate and regular rhythm.     Heart sounds: Normal heart sounds.  Pulmonary:     Effort: No respiratory distress.     Breath sounds: No stridor. No wheezing or rhonchi.  Abdominal:     General: Bowel sounds are normal. There is no distension.     Palpations: Abdomen is soft. There is no mass.     Tenderness: There is no abdominal tenderness. There is no guarding or rebound.  Musculoskeletal:        General: Tenderness present.     Cervical back: Normal range of motion and neck supple. No rigidity.     Right lower leg: Edema present.     Left lower leg: Edema present.  Lymphadenopathy:     Cervical: No cervical adenopathy.  Skin:    Findings: No erythema or rash.  Neurological:     Cranial Nerves: No cranial nerve deficit.     Motor: No abnormal muscle tone.     Coordination: Coordination normal.     Deep Tendon Reflexes: Reflexes normal.  Psychiatric:        Behavior: Behavior normal.        Thought Content: Thought content normal.        Judgment: Judgment normal.   Firm edema B Using a walker Pt looks better    Lab Results  Component Value Date   WBC 4.1 09/15/2021   HGB 16.4 (H) 09/15/2021   HCT 49.8 (H) 09/15/2021   PLT 177.0 09/15/2021   GLUCOSE 105 (H) 09/15/2021   CHOL 186 05/22/2019   TRIG 268.0 (H) 05/22/2019   HDL 57.80 05/22/2019   LDLDIRECT 80.0 05/22/2019   LDLCALC 42 02/23/2018   ALT 21 09/15/2021   AST 29 09/15/2021   NA 138 09/15/2021   K 3.9 09/15/2021   CL 95 (L) 09/15/2021   CREATININE 1.27 (H) 09/15/2021   BUN 52 (H) 09/15/2021   CO2 34 (H) 09/15/2021   TSH 3.32 04/15/2021   INR 0.96 07/24/2017   HGBA1C 5.6 05/22/2019    No results found.  Assessment & Plan:   Problem List Items Addressed This Visit     Ataxia    S/p a fall 2 wks ago at [facility] - using a walker now      CHF (congestive heart failure) ([place])    Better on O2 [place] at night. Portable O2 is too heavy for the pt      Relevant Orders   CBC with  Differential/Platelet   Comprehensive metabolic panel  TSH   DOE (dyspnea on exertion) - Primary    Better on O2 [place] at night. Portable O2 is too heavy for the pt      Relevant Orders   CBC with Differential/Platelet   Comprehensive metabolic panel   TSH   Fall against object    S/p a fall at [facility] - using a walker now      Hypoxemia    On O2 now per [place]      Relevant Orders   CBC with Differential/Platelet   Comprehensive metabolic panel   TSH   Vasomotor rhinitis    Worse Start Atrovent nasal tid prn      Other Visit Diagnoses     Needs flu shot       Relevant Orders   Flu Vaccine QUAD High Dose(Fluad) (Completed)         Meds ordered this encounter  Medications   ipratropium (ATROVENT) 0.06 % nasal spray    Sig: Place 2 sprays into the nose 3 (three) times daily.    Dispense:  15 mL    Refill:  3      Follow-up: Return in about 3 months (around 02/16/2022) for a follow-up visit.  Walker Kehr, MD

## 2021-11-16 NOTE — Assessment & Plan Note (Signed)
S/p a fall 2 wks ago at [facility] - using a walker now

## 2021-11-16 NOTE — Assessment & Plan Note (Signed)
S/p a fall at [facility] - using a walker now

## 2021-11-16 NOTE — Assessment & Plan Note (Signed)
On O2 now per [place]

## 2021-11-16 NOTE — Assessment & Plan Note (Signed)
Worse Start Atrovent nasal tid prn

## 2021-11-16 NOTE — Assessment & Plan Note (Signed)
Better on O2 [place] at night. Portable O2 is too heavy for the pt

## 2021-11-16 NOTE — Assessment & Plan Note (Addendum)
Better on O2 [place] at night. Portable O2 is too heavy for the pt

## 2021-11-17 ENCOUNTER — Ambulatory Visit (INDEPENDENT_AMBULATORY_CARE_PROVIDER_SITE_OTHER): Payer: Medicare Other | Admitting: Podiatry

## 2021-11-17 ENCOUNTER — Encounter: Payer: Self-pay | Admitting: Podiatry

## 2021-11-17 DIAGNOSIS — L84 Corns and callosities: Secondary | ICD-10-CM

## 2021-11-17 DIAGNOSIS — I739 Peripheral vascular disease, unspecified: Secondary | ICD-10-CM

## 2021-11-17 NOTE — Progress Notes (Signed)
This patient returns to my office for at risk foot care.  This patient requires this care by a professional since this patient will be at risk due to having  PAD and coagulation defect.  This patient is being treated for painful callus especially on her right foot.  This patient presents for at risk foot care today.  General Appearance  Alert, conversant and in no acute stress.  Vascular  Dorsalis pedis and posterior tibial  pulses are palpable  bilaterally.  Capillary return is within normal limits  bilaterally. Temperature is within normal limits  bilaterally.  Neurologic  Senn-Weinstein monofilament wire test within normal limits  bilaterally. Muscle power within normal limits bilaterally.  Nails Normotropic nails . No evidence of bacterial infection or drainage bilaterally.  Orthopedic  No limitations of motion  feet .  No crepitus or effusions noted.  No bony pathology or digital deformities noted.  Skin  normotropic skin with no porokeratosis noted bilaterally.  No signs of infections or ulcers noted. Pre-ulcerous callus 1,5  right foot.  Callus sub 1st MPJ left foot.    Painful callus right forefoot.  Consent was obtained for treatment procedures.  Debridement of callus with #15 blade and dremel tool.  Filed with dremel without incident.    Return office visit  2 months                      Told patient to return for periodic foot care and evaluation due to potential at risk complications.   Gardiner Barefoot DPM

## 2021-11-18 ENCOUNTER — Encounter: Payer: Self-pay | Admitting: Internal Medicine

## 2021-11-18 ENCOUNTER — Other Ambulatory Visit: Payer: Self-pay | Admitting: Internal Medicine

## 2021-11-18 DIAGNOSIS — R202 Paresthesia of skin: Secondary | ICD-10-CM

## 2021-11-18 DIAGNOSIS — D751 Secondary polycythemia: Secondary | ICD-10-CM | POA: Insufficient documentation

## 2021-12-01 DIAGNOSIS — Z23 Encounter for immunization: Secondary | ICD-10-CM | POA: Diagnosis not present

## 2021-12-06 ENCOUNTER — Telehealth: Payer: Self-pay

## 2021-12-06 NOTE — Telephone Encounter (Signed)
Error

## 2021-12-09 ENCOUNTER — Other Ambulatory Visit: Payer: Self-pay | Admitting: Internal Medicine

## 2021-12-16 ENCOUNTER — Encounter: Payer: Self-pay | Admitting: Internal Medicine

## 2021-12-16 ENCOUNTER — Ambulatory Visit (INDEPENDENT_AMBULATORY_CARE_PROVIDER_SITE_OTHER): Payer: Medicare Other

## 2021-12-16 ENCOUNTER — Ambulatory Visit (INDEPENDENT_AMBULATORY_CARE_PROVIDER_SITE_OTHER): Payer: Medicare Other | Admitting: Internal Medicine

## 2021-12-16 VITALS — BP 120/82 | HR 79 | Temp 98.1°F | Ht 61.0 in | Wt 173.0 lb

## 2021-12-16 DIAGNOSIS — Z85038 Personal history of other malignant neoplasm of large intestine: Secondary | ICD-10-CM | POA: Diagnosis not present

## 2021-12-16 DIAGNOSIS — R140 Abdominal distension (gaseous): Secondary | ICD-10-CM

## 2021-12-16 MED ORDER — METRONIDAZOLE 500 MG PO TABS: 500.0000 mg | ORAL_TABLET | Freq: Three times a day (TID) | ORAL | 1 refills | Status: DC

## 2021-12-16 NOTE — Assessment & Plan Note (Signed)
Start Flagyl po, probiotic Check abd X ray, labs w/CEA

## 2021-12-16 NOTE — Assessment & Plan Note (Signed)
Check CEA

## 2021-12-16 NOTE — Progress Notes (Signed)
Subjective:  Patient ID: Gina [name], female    DOB: January 19, 1932  Age: 88 y.o. MRN: [id number]  CC: GI Problem (C/o stomach cramps and a lot of fluctuate )   HPI Gina [name] presents for gas pains and cramping before urination and BM. Cramps are gone. A lot of gas - forceful BM in AM. 3 BMs a day Eating more vegetables. C/o gas  Outpatient Medications Prior to Visit  Medication Sig Dispense Refill   acetaminophen (TYLENOL) 500 MG tablet Take 1,000 mg by mouth daily. 4 tabs daily     allopurinol (ZYLOPRIM) 100 MG tablet TAKE 1 TABLET DAILY 90 tablet 3   amLODipine (NORVASC) 5 MG tablet TAKE 1 TABLET DAILY 90 tablet 3   atorvastatin (LIPITOR) 40 MG tablet Take 1 tablet (40 mg total) by mouth every evening. 90 tablet 3   Calcium Carbonate (CALCIUM 600 PO) Take 600 mg by mouth 2 (two) times daily.     cetirizine (ZYRTEC) 10 MG tablet Take 1 tablet (10 mg total) by mouth 2 (two) times daily. 90 tablet 3   clopidogrel (PLAVIX) 75 MG tablet Take 1 tablet (75 mg total) by mouth daily. 90 tablet 3   Fluticasone-Umeclidin-Vilant (TRELEGY ELLIPTA) 100-62.5-25 MCG/ACT AEPB USE 1 INHALATION ORALLY    DAILY 180 each 3   gabapentin (NEURONTIN) 400 MG capsule TAKE 1 CAPSULE 4 TIMES     DAILY 360 capsule 3   ipratropium (ATROVENT) 0.06 % nasal spray Place 2 sprays into the nose 3 (three) times daily. 15 mL 3   ketoconazole (NIZORAL) 2 % cream as needed.     losartan (COZAAR) 50 MG tablet TAKE 1 TABLET DAILY 90 tablet 3   mometasone (NASONEX) 50 MCG/ACT nasal spray SHAKE LIQUID AND USE 2 SPRAYS IN EACH NOSTRIL DAILY 17 g 5   Multiple Vitamin (MULTIVITAMIN WITH MINERALS) TABS tablet Take 1 tablet by mouth daily.     Multiple Vitamins-Minerals (PRESERVISION AREDS 2 PO) Take 1 tablet by mouth daily.     mupirocin ointment (BACTROBAN) 2 % Apply topically.     saccharomyces boulardii (FLORASTOR) 250 MG capsule Take 250 mg by mouth daily.     torsemide (DEMADEX) 20 MG tablet Take 1-2 tablets (20-40  mg total) by mouth daily. 60 tablet 5   triamterene-hydrochlorothiazide (MAXZIDE-25) 37.5-25 MG tablet TAKE 1 TABLET DAILY 90 tablet 3   vitamin C (ASCORBIC ACID) 500 MG tablet Take 500 mg by mouth every evening.     No facility-administered medications prior to visit.    ROS: Review of Systems  Constitutional:  Negative for activity change, appetite change, chills, fatigue and unexpected weight change.  HENT:  Negative for congestion, mouth sores and sinus pressure.   Eyes:  Negative for visual disturbance.  Respiratory:  Negative for cough and chest tightness.   Gastrointestinal:  Negative for abdominal distention, abdominal pain, blood in stool, constipation, diarrhea, nausea and vomiting.  Genitourinary:  Negative for difficulty urinating, frequency and vaginal pain.  Musculoskeletal:  Positive for arthralgias and gait problem. Negative for back pain.  Skin:  Negative for pallor and rash.  Neurological:  Negative for dizziness, tremors, weakness, numbness and headaches.  Psychiatric/Behavioral:  Negative for confusion and sleep disturbance.     Objective:  BP 120/82 (BP Location: Left Arm)   Pulse 79   Temp 98.1 F (36.7 C) (Oral)   Ht 5\' 1"  (1.549 m)   Wt 173 lb (78.5 kg)   SpO2 90%   BMI 32.69 kg/m  BP Readings from Last 3 Encounters:  12/16/21 120/82  11/16/21 122/62  09/21/21 122/66    Wt Readings from Last 3 Encounters:  12/16/21 173 lb (78.5 kg)  11/16/21 176 lb (79.8 kg)  09/21/21 183 lb (83 kg)    Physical Exam Constitutional:      General: She is not in acute distress.    Appearance: She is well-developed.  HENT:     Head: Normocephalic.     Right Ear: External ear normal.     Left Ear: External ear normal.     Nose: Nose normal.  Eyes:     General:        Right eye: No discharge.        Left eye: No discharge.     Conjunctiva/sclera: Conjunctivae normal.     Pupils: Pupils are equal, round, and reactive to light.  Neck:     Thyroid: No  thyromegaly.     Vascular: No JVD.     Trachea: No tracheal deviation.  Cardiovascular:     Rate and Rhythm: Normal rate and regular rhythm.     Heart sounds: Normal heart sounds.  Pulmonary:     Effort: No respiratory distress.     Breath sounds: No stridor. No wheezing.  Abdominal:     General: Bowel sounds are normal. There is no distension.     Palpations: Abdomen is soft. There is no mass.     Tenderness: There is no abdominal tenderness. There is no guarding or rebound.  Musculoskeletal:        General: No tenderness.     Cervical back: Normal range of motion and neck supple. No rigidity.  Lymphadenopathy:     Cervical: No cervical adenopathy.  Skin:    Findings: No erythema or rash.  Neurological:     Cranial Nerves: No cranial nerve deficit.     Motor: No abnormal muscle tone.     Coordination: Coordination normal.     Deep Tendon Reflexes: Reflexes normal.  Psychiatric:        Behavior: Behavior normal.        Thought Content: Thought content normal.        Judgment: Judgment normal.     Lab Results  Component Value Date   WBC 4.5 11/16/2021   HGB 17.1 (H) 11/16/2021   HCT 51.9 (H) 11/16/2021   PLT 139.0 (L) 11/16/2021   GLUCOSE 103 (H) 11/16/2021   CHOL 186 05/22/2019   TRIG 268.0 (H) 05/22/2019   HDL 57.80 05/22/2019   LDLDIRECT 80.0 05/22/2019   LDLCALC 42 02/23/2018   ALT 15 11/16/2021   AST 24 11/16/2021   NA 141 11/16/2021   K 4.4 11/16/2021   CL 99 11/16/2021   CREATININE 1.22 (H) 11/16/2021   BUN 40 (H) 11/16/2021   CO2 33 (H) 11/16/2021   TSH 1.81 11/16/2021   INR 0.96 07/24/2017   HGBA1C 5.6 05/22/2019    No results found.  Assessment & Plan:   Problem List Items Addressed This Visit     History of colon cancer - Primary    Check CEA      Relevant Orders   CEA   CBC With Differential   Meteorism    Start Flagyl po, probiotic Check abd X ray, labs w/CEA      Relevant Orders   DG Abd 2 Views   CBC With Differential       Meds ordered this encounter  Medications   metroNIDAZOLE (FLAGYL) 500  MG tablet    Sig: Take 1 tablet (500 mg total) by mouth 3 (three) times daily.    Dispense:  30 tablet    Refill:  1      Follow-up: Return in about 6 weeks (around 01/27/2022) for a follow-up visit.  Walker Kehr, MD

## 2021-12-17 LAB — CBC WITH DIFFERENTIAL
Basophils Absolute: 0 10*3/uL (ref 0.0–0.2)
Basos: 1 %
EOS (ABSOLUTE): 0.2 10*3/uL (ref 0.0–0.4)
Eos: 4 %
Hematocrit: 49.6 % — ABNORMAL HIGH (ref 34.0–46.6)
Hemoglobin: 17 g/dL — ABNORMAL HIGH (ref 11.1–15.9)
Immature Grans (Abs): 0 10*3/uL (ref 0.0–0.1)
Immature Granulocytes: 0 %
Lymphocytes Absolute: 1.3 10*3/uL (ref 0.7–3.1)
Lymphs: 22 %
MCH: 35.1 pg — ABNORMAL HIGH (ref 26.6–33.0)
MCHC: 34.3 g/dL (ref 31.5–35.7)
MCV: 103 fL — ABNORMAL HIGH (ref 79–97)
Monocytes Absolute: 0.6 10*3/uL (ref 0.1–0.9)
Monocytes: 10 %
Neutrophils Absolute: 3.8 10*3/uL (ref 1.4–7.0)
Neutrophils: 63 %
RBC: 4.84 x10E6/uL (ref 3.77–5.28)
RDW: 13.3 % (ref 11.7–15.4)
WBC: 6.1 10*3/uL (ref 3.4–10.8)

## 2021-12-17 LAB — CEA: CEA: 4.9 ng/mL — ABNORMAL HIGH

## 2021-12-21 ENCOUNTER — Telehealth: Payer: Self-pay | Admitting: *Deleted

## 2021-12-21 DIAGNOSIS — D751 Secondary polycythemia: Secondary | ICD-10-CM

## 2021-12-21 NOTE — Telephone Encounter (Signed)
Called pt concerning abdominal xray results. She states tell MD that she is able to see the results on mychart now, and that he had mention referring her to see hematologist. She states if MD thinks she need top see one ok for referral,,,/lmb

## 2021-12-22 NOTE — Telephone Encounter (Signed)
Noted.  Will do.  Thank you

## 2021-12-23 ENCOUNTER — Telehealth: Payer: Self-pay | Admitting: Internal Medicine

## 2021-12-23 ENCOUNTER — Observation Stay (HOSPITAL_COMMUNITY): Payer: Medicare Other

## 2021-12-23 ENCOUNTER — Observation Stay (HOSPITAL_COMMUNITY)
Admission: EM | Admit: 2021-12-23 | Discharge: 2021-12-24 | Disposition: A | Discharge status: Home Health | Payer: Medicare Other | Attending: Family Medicine | Admitting: Family Medicine

## 2021-12-23 ENCOUNTER — Other Ambulatory Visit: Payer: Self-pay

## 2021-12-23 ENCOUNTER — Emergency Department (HOSPITAL_COMMUNITY): Payer: Medicare Other

## 2021-12-23 ENCOUNTER — Encounter (HOSPITAL_COMMUNITY): Payer: Self-pay | Admitting: *Deleted

## 2021-12-23 DIAGNOSIS — Z7952 Long term (current) use of systemic steroids: Secondary | ICD-10-CM | POA: Diagnosis not present

## 2021-12-23 DIAGNOSIS — I509 Heart failure, unspecified: Secondary | ICD-10-CM | POA: Insufficient documentation

## 2021-12-23 DIAGNOSIS — M50221 Other cervical disc displacement at C4-C5 level: Secondary | ICD-10-CM | POA: Diagnosis not present

## 2021-12-23 DIAGNOSIS — Z66 Do not resuscitate: Secondary | ICD-10-CM | POA: Diagnosis not present

## 2021-12-23 DIAGNOSIS — Z87891 Personal history of nicotine dependence: Secondary | ICD-10-CM | POA: Diagnosis not present

## 2021-12-23 DIAGNOSIS — I110 Hypertensive heart disease with heart failure: Secondary | ICD-10-CM | POA: Diagnosis not present

## 2021-12-23 DIAGNOSIS — Z1152 Encounter for screening for COVID-19: Secondary | ICD-10-CM | POA: Insufficient documentation

## 2021-12-23 DIAGNOSIS — I639 Cerebral infarction, unspecified: Secondary | ICD-10-CM | POA: Diagnosis not present

## 2021-12-23 DIAGNOSIS — J449 Chronic obstructive pulmonary disease, unspecified: Secondary | ICD-10-CM | POA: Diagnosis not present

## 2021-12-23 DIAGNOSIS — E785 Hyperlipidemia, unspecified: Secondary | ICD-10-CM | POA: Diagnosis present

## 2021-12-23 DIAGNOSIS — R059 Cough, unspecified: Secondary | ICD-10-CM | POA: Diagnosis not present

## 2021-12-23 DIAGNOSIS — R29818 Other symptoms and signs involving the nervous system: Secondary | ICD-10-CM | POA: Diagnosis present

## 2021-12-23 DIAGNOSIS — J9611 Chronic respiratory failure with hypoxia: Secondary | ICD-10-CM | POA: Insufficient documentation

## 2021-12-23 DIAGNOSIS — D751 Secondary polycythemia: Secondary | ICD-10-CM | POA: Diagnosis present

## 2021-12-23 DIAGNOSIS — R0602 Shortness of breath: Secondary | ICD-10-CM | POA: Diagnosis not present

## 2021-12-23 DIAGNOSIS — J9811 Atelectasis: Secondary | ICD-10-CM | POA: Diagnosis not present

## 2021-12-23 DIAGNOSIS — R001 Bradycardia, unspecified: Secondary | ICD-10-CM | POA: Diagnosis not present

## 2021-12-23 DIAGNOSIS — G629 Polyneuropathy, unspecified: Secondary | ICD-10-CM | POA: Diagnosis not present

## 2021-12-23 DIAGNOSIS — Z79899 Other long term (current) drug therapy: Secondary | ICD-10-CM | POA: Insufficient documentation

## 2021-12-23 DIAGNOSIS — Z7902 Long term (current) use of antithrombotics/antiplatelets: Secondary | ICD-10-CM | POA: Diagnosis not present

## 2021-12-23 DIAGNOSIS — M109 Gout, unspecified: Secondary | ICD-10-CM | POA: Diagnosis present

## 2021-12-23 DIAGNOSIS — Z85038 Personal history of other malignant neoplasm of large intestine: Secondary | ICD-10-CM | POA: Diagnosis not present

## 2021-12-23 DIAGNOSIS — M47812 Spondylosis without myelopathy or radiculopathy, cervical region: Secondary | ICD-10-CM | POA: Diagnosis not present

## 2021-12-23 DIAGNOSIS — R531 Weakness: Secondary | ICD-10-CM | POA: Diagnosis not present

## 2021-12-23 DIAGNOSIS — M4802 Spinal stenosis, cervical region: Secondary | ICD-10-CM | POA: Diagnosis not present

## 2021-12-23 DIAGNOSIS — R0689 Other abnormalities of breathing: Secondary | ICD-10-CM | POA: Diagnosis not present

## 2021-12-23 DIAGNOSIS — I959 Hypotension, unspecified: Secondary | ICD-10-CM | POA: Diagnosis not present

## 2021-12-23 DIAGNOSIS — R0902 Hypoxemia: Secondary | ICD-10-CM | POA: Diagnosis present

## 2021-12-23 DIAGNOSIS — I10 Essential (primary) hypertension: Secondary | ICD-10-CM | POA: Diagnosis present

## 2021-12-23 LAB — DIFFERENTIAL
Abs Immature Granulocytes: 0.01 10*3/uL (ref 0.00–0.07)
Basophils Absolute: 0 10*3/uL (ref 0.0–0.1)
Basophils Relative: 1 %
Eosinophils Absolute: 0.1 10*3/uL (ref 0.0–0.5)
Eosinophils Relative: 2 %
Immature Granulocytes: 0 %
Lymphocytes Relative: 16 %
Lymphs Abs: 0.7 10*3/uL (ref 0.7–4.0)
Monocytes Absolute: 0.4 10*3/uL (ref 0.1–1.0)
Monocytes Relative: 9 %
Neutro Abs: 3.1 10*3/uL (ref 1.7–7.7)
Neutrophils Relative %: 72 %

## 2021-12-23 LAB — COMPREHENSIVE METABOLIC PANEL
ALT: 17 U/L (ref 0–44)
AST: 30 U/L (ref 15–41)
Albumin: 3.9 g/dL (ref 3.5–5.0)
Alkaline Phosphatase: 50 U/L (ref 38–126)
Anion gap: 10 (ref 5–15)
BUN: 33 mg/dL — ABNORMAL HIGH (ref 8–23)
CO2: 29 mmol/L (ref 22–32)
Calcium: 11.6 mg/dL — ABNORMAL HIGH (ref 8.9–10.3)
Chloride: 100 mmol/L (ref 98–111)
Creatinine, Ser: 1.15 mg/dL — ABNORMAL HIGH (ref 0.44–1.00)
GFR, Estimated: 46 mL/min — ABNORMAL LOW (ref >60)
Glucose, Bld: 129 mg/dL — ABNORMAL HIGH (ref 70–99)
Potassium: 4.3 mmol/L (ref 3.5–5.1)
Sodium: 139 mmol/L (ref 135–145)
Total Bilirubin: 0.9 mg/dL (ref 0.3–1.2)
Total Protein: 7 g/dL (ref 6.5–8.1)

## 2021-12-23 LAB — CBC
HCT: 52.6 % — ABNORMAL HIGH (ref 36.0–46.0)
Hemoglobin: 17.6 g/dL — ABNORMAL HIGH (ref 12.0–15.0)
MCH: 35.2 pg — ABNORMAL HIGH (ref 26.0–34.0)
MCHC: 33.5 g/dL (ref 30.0–36.0)
MCV: 105.2 fL — ABNORMAL HIGH (ref 80.0–100.0)
Platelets: 146 10*3/uL — ABNORMAL LOW (ref 150–400)
RBC: 5 MIL/uL (ref 3.87–5.11)
RDW: 15.1 % (ref 11.5–15.5)
WBC: 4.3 10*3/uL (ref 4.0–10.5)
nRBC: 0 % (ref 0.0–0.2)

## 2021-12-23 LAB — I-STAT CHEM 8, ED
BUN: 37 mg/dL — ABNORMAL HIGH (ref 8–23)
Calcium, Ion: 1.48 mmol/L — ABNORMAL HIGH (ref 1.15–1.40)
Chloride: 99 mmol/L (ref 98–111)
Creatinine, Ser: 1.1 mg/dL — ABNORMAL HIGH (ref 0.44–1.00)
Glucose, Bld: 134 mg/dL — ABNORMAL HIGH (ref 70–99)
HCT: 54 % — ABNORMAL HIGH (ref 36.0–46.0)
Hemoglobin: 18.4 g/dL — ABNORMAL HIGH (ref 12.0–15.0)
Potassium: 4 mmol/L (ref 3.5–5.1)
Sodium: 140 mmol/L (ref 135–145)
TCO2: 32 mmol/L (ref 22–32)

## 2021-12-23 LAB — RESP PANEL BY RT-PCR (FLU A&B, COVID) ARPGX2
Influenza A by PCR: NEGATIVE
Influenza B by PCR: NEGATIVE
SARS Coronavirus 2 by RT PCR: NEGATIVE

## 2021-12-23 LAB — PROTIME-INR
INR: 1.1 (ref 0.8–1.2)
Prothrombin Time: 14.3 seconds (ref 11.4–15.2)

## 2021-12-23 LAB — ETHANOL: Alcohol, Ethyl (B): <10 mg/dL (ref <10)

## 2021-12-23 LAB — BRAIN NATRIURETIC PEPTIDE: B Natriuretic Peptide: 94.8 pg/mL (ref 0.0–100.0)

## 2021-12-23 LAB — APTT: aPTT: 34 seconds (ref 24–36)

## 2021-12-23 MED ORDER — ALLOPURINOL 100 MG PO TABS
100.0000 mg | ORAL_TABLET | Freq: Every morning | ORAL | Status: DC
  Administered 2021-12-24: 100 mg via ORAL
  Filled 2021-12-23: qty 1

## 2021-12-23 MED ORDER — ACETAMINOPHEN 325 MG PO TABS
650.0000 mg | ORAL_TABLET | ORAL | Status: DC | PRN
  Administered 2021-12-24: 650 mg via ORAL
  Filled 2021-12-23: qty 2

## 2021-12-23 MED ORDER — SENNOSIDES-DOCUSATE SODIUM 8.6-50 MG PO TABS: 1.0000 | ORAL_TABLET | Freq: Every evening | ORAL | Status: DC | PRN

## 2021-12-23 MED ORDER — GABAPENTIN 400 MG PO CAPS
400.0000 mg | ORAL_CAPSULE | Freq: Three times a day (TID) | ORAL | Status: DC
  Administered 2021-12-23 – 2021-12-24 (×3): 400 mg via ORAL
  Filled 2021-12-23 (×3): qty 1

## 2021-12-23 MED ORDER — ACETAMINOPHEN 650 MG RE SUPP: 650.0000 mg | RECTAL | Status: DC | PRN

## 2021-12-23 MED ORDER — FLUTICASONE FUROATE-VILANTEROL 100-25 MCG/ACT IN AEPB
1.0000 | INHALATION_SPRAY | Freq: Every day | RESPIRATORY_TRACT | Status: DC
  Administered 2021-12-24: 1 via RESPIRATORY_TRACT
  Filled 2021-12-23 (×2): qty 28

## 2021-12-23 MED ORDER — UMECLIDINIUM BROMIDE 62.5 MCG/ACT IN AEPB
1.0000 | INHALATION_SPRAY | Freq: Every day | RESPIRATORY_TRACT | Status: DC
  Administered 2021-12-24: 1 via RESPIRATORY_TRACT
  Filled 2021-12-23 (×2): qty 7

## 2021-12-23 MED ORDER — STROKE: EARLY STAGES OF RECOVERY BOOK: Freq: Once | Status: DC

## 2021-12-23 MED ORDER — ACETAMINOPHEN 160 MG/5ML PO SOLN: 650.0000 mg | ORAL | Status: DC | PRN

## 2021-12-23 MED ORDER — ONDANSETRON HCL 4 MG/2ML IJ SOLN: 4.0000 mg | Freq: Four times a day (QID) | INTRAMUSCULAR | Status: DC | PRN

## 2021-12-23 MED ORDER — CLOPIDOGREL BISULFATE 75 MG PO TABS
75.0000 mg | ORAL_TABLET | Freq: Every day | ORAL | Status: DC
  Administered 2021-12-23 – 2021-12-24 (×2): 75 mg via ORAL
  Filled 2021-12-23 (×2): qty 1

## 2021-12-23 MED ORDER — ENOXAPARIN SODIUM 40 MG/0.4ML IJ SOSY
40.0000 mg | PREFILLED_SYRINGE | INTRAMUSCULAR | Status: DC
  Administered 2021-12-23: 40 mg via SUBCUTANEOUS
  Filled 2021-12-23: qty 0.4

## 2021-12-23 MED ORDER — LORATADINE 10 MG PO TABS
10.0000 mg | ORAL_TABLET | Freq: Every day | ORAL | Status: DC
  Administered 2021-12-24: 10 mg via ORAL
  Filled 2021-12-23: qty 1

## 2021-12-23 MED ORDER — GADOBUTROL 1 MMOL/ML IV SOLN
8.0000 mL | Freq: Once | INTRAVENOUS | Status: AC | PRN
  Administered 2021-12-23: 8 mL via INTRAVENOUS

## 2021-12-23 MED ORDER — ATORVASTATIN CALCIUM 40 MG PO TABS
40.0000 mg | ORAL_TABLET | Freq: Every evening | ORAL | Status: DC
  Administered 2021-12-23 – 2021-12-24 (×2): 40 mg via ORAL
  Filled 2021-12-23 (×2): qty 1

## 2021-12-23 NOTE — ED Provider Triage Note (Signed)
Emergency Medicine Provider Triage Evaluation Note  Gina [name] , a 88 y.o. female  was evaluated in triage.  Pt complains of left 2 through 5 finger weakness.  Patient states she noted symptoms upon awakening this morning.  She notes continuation of weakness of grip strength with difficulty performing daily tasks such as lifting objects or putting on parole.  She denies history of TIA with no actual stroke and is concerned about similar.  Upon arrival, patient was noted to be hypoxic with oxygen saturations in the mid to low 80s on room air.  Patient states she uses 2 L of oxygen at night as needed.  She is currently being treated for CHF exacerbation and is noted no increase in daily weights.  Denies fever, chills, night sweats, chest pain, shortness of breath, abdominal pain, nausea, vomiting, urinary symptoms, change in bowel habits.  Denies sensory deficits, slurred speech, facial droop, visual disturbance.  She has noted some gait instability/abnormality since awakening this morning.  She states she went to bed around 10 PM last night with awakening at some point during the night to use the restroom but is not aware of when exactly that was.  Review of Systems  Positive:  Negative: See above  Physical Exam  BP (!) 164/63 (BP Location: Right Arm)   Pulse 95   Temp 98.5 F (36.9 C) (Oral)   Resp 20   SpO2 91%  Gen:   Awake, no distress   Resp:  Normal effort MSK:   Moves extremities without difficulty  Other:  Slight decrease in grip strength on left compared to right.  Otherwise, cranial 3 through 12 grossly intact.  Medical Decision Making  Medically screening exam initiated at 10:51 AM.  Appropriate orders placed.  [place] was informed that the remainder of the evaluation will be completed by another provider, this initial triage assessment does not replace that evaluation, and the importance of remaining in the ED until their evaluation is complete.     Gina [name], [place] 12/23/21 1053

## 2021-12-23 NOTE — Telephone Encounter (Signed)
Called pt again still no answer LMOM RTC.Marland KitchenJohny [name]

## 2021-12-23 NOTE — ED Triage Notes (Signed)
Patient states her left hand  just wasn't working right  states she just feels like she isn as coordinated in her hand and c/o generalized body weakness,

## 2021-12-23 NOTE — ED Notes (Signed)
Pt updated on plan of care and wait time while in lobby. Swallow screen passed and pt given sprite

## 2021-12-23 NOTE — H&P (Signed)
History and Physical    Patient: Gina [name] VOH:[id number] DOB: [birth year] DOA: 12/23/2021 DOS: the patient was seen and examined on 12/23/2021 PCP: Plotnikov, Evie Lacks, MD  Patient coming from: Home - lives alone; NOK: [place], [phone]   Chief Complaint: L hand weakness  HPI: Gina [name] is a 88 y.o. female with medical history significant of remote colon cancer; HTN; HLD; TIA; and PAD presenting with L hand weakness.  She reports that she noticed acute onset of L hand numbness/tingling and inability to hold objects this AM.  She did feel generally weak yesterday without focal neuro deficits; she usually uses a cane during the daytime and a walker at night but used a walker all day yesterday.  The hand symptoms may be somewhat improving.  No headache or neck pain.  No other neurologic symptoms including dysphagia, dysarthria.  She has been told she has COPD in the remote past and more recently CHF and wears nocturnal O2 for this.    ER Course:  r/o CVA - difficulty moving her L hand.  Recently treated for CHF, on O2 at home and now on baseline.  Normal BNP.       Review of Systems: As mentioned in the history of present illness. All other systems reviewed and are negative. Past Medical History:  Diagnosis Date   Allergic rhinitis    seasonal   Arthritis    Colon cancer [facility]) Dec 12   Diverticulosis of colon (without mention of hemorrhage)    GI bleed    had cyst removed from spine....went back on plavix to soon   Gout    Hyperlipidemia    Hyperplastic colon polyp    Hypertension    Macular degeneration    PAD (peripheral artery disease) ([place])    Pneumonia    back in 05/2017   Stroke [facility])    TIA  10/2009   Unspecified transient cerebral ischemia sept '11   Past Surgical History:  Procedure Laterality Date   ABDOMINAL AORTOGRAM N/A 06/16/2017   Procedure: ABDOMINAL AORTOGRAM;  Surgeon: Angelia Mould, MD;  Location: [facility];   Service: Cardiovascular;  Laterality: N/A;   APPENDECTOMY     BACK SURGERY  April '12   benign cyst spine ([place])   BREAST LUMPECTOMY     right breast '86   COLON SURGERY  Dec '12   colectomy for colon cancer ([place])   ENDARTERECTOMY FEMORAL Bilateral 08/02/2017   ENDARTERECTOMY FEMORAL Bilateral 08/02/2017   Procedure: Bilateral Femoral ENDARTERECTOMY with Patch Angioplasty;  Surgeon: Rosetta Posner, MD;  Location: [facility];  Service: Vascular;  Laterality: Bilateral;   EYE SURGERY     bilateral cataracts   LOWER EXTREMITY ANGIOGRAPHY N/A 06/16/2017   Procedure: LOWER EXTREMITY ANGIOGRAPHY;  Surgeon: Angelia Mould, MD;  Location: [facility];  Service: Cardiovascular;  Laterality: N/A;   PERIPHERAL VASCULAR INTERVENTION  06/16/2017   Procedure: PERIPHERAL VASCULAR INTERVENTION;  Surgeon: Angelia Mould, MD;  Location: [facility];  Service: Cardiovascular;;  left common iliac   TONSILLECTOMY     Social History:  reports that she quit smoking about 34 years ago. Her smoking use included cigarettes. She has a 45.00 pack-year smoking history. She has never used smokeless tobacco. She reports current alcohol use of about 14.0 standard drinks of alcohol per week. She reports that she does not use drugs.  Allergies  Allergen Reactions   Penicillins Hives    Has patient  had a PCN reaction causing immediate rash, facial/tongue/throat swelling, SOB or lightheadedness with hypotension:## yes ## Has patient had a PCN reaction causing severe rash involving mucus membranes or skin necrosis: no Has patient had a PCN reaction that required hospitalization: no Has patient had a PCN reaction occurring within the last 10 years: no If all of the above answers are "NO", then may proceed with Cephalosporin use.    Aspirin Hives   Enalapril Other (See Comments) and Cough    Wheezing, also   Flonase [Fluticasone] Other (See Comments)    Nose bleeds   Adhesive [Tape] Other (See  Comments)    "pulls skin off"    Family History  Problem Relation Age of Onset   Colon cancer Father    Hypertension Father    Hyperlipidemia Father    Coronary artery disease Father    Ulcers Father    Heart failure Mother    Coronary artery disease Mother    Breast cancer Mother    Hypertension Mother    Hyperlipidemia Mother    Asthma Mother    Diabetes Neg Hx     Prior to Admission medications   Medication Sig Start Date End Date Taking? Authorizing Provider  acetaminophen (TYLENOL) 500 MG tablet Take 1,000 mg by mouth daily. 4 tabs daily    [provider]  allopurinol (ZYLOPRIM) 100 MG tablet TAKE 1 TABLET DAILY 09/01/21   Plotnikov, Evie Lacks, MD  amLODipine (NORVASC) 5 MG tablet TAKE 1 TABLET DAILY 09/01/21   Plotnikov, Evie Lacks, MD  atorvastatin (LIPITOR) 40 MG tablet Take 1 tablet (40 mg total) by mouth every evening. 10/01/20   Plotnikov, Evie Lacks, MD  Calcium Carbonate (CALCIUM 600 PO) Take 600 mg by mouth 2 (two) times daily.    [provider]  cetirizine (ZYRTEC) 10 MG tablet Take 1 tablet (10 mg total) by mouth 2 (two) times daily. 10/01/20   Plotnikov, Evie Lacks, MD  clopidogrel (PLAVIX) 75 MG tablet Take 1 tablet (75 mg total) by mouth daily. 10/01/20   Plotnikov, Evie Lacks, MD  Fluticasone-Umeclidin-Vilant (TRELEGY ELLIPTA) 100-62.5-25 MCG/ACT AEPB USE 1 INHALATION ORALLY    DAILY 10/20/21   Plotnikov, Evie Lacks, MD  gabapentin (NEURONTIN) 400 MG capsule TAKE 1 CAPSULE 4 TIMES     DAILY 12/09/21   Plotnikov, Evie Lacks, MD  ipratropium (ATROVENT) 0.06 % nasal spray Place 2 sprays into the nose 3 (three) times daily. 11/16/21 11/16/22  Plotnikov, Evie Lacks, MD  ketoconazole (NIZORAL) 2 % cream as needed. 09/10/19   [provider]  losartan (COZAAR) 50 MG tablet TAKE 1 TABLET DAILY 09/01/21   Plotnikov, Evie Lacks, MD  metroNIDAZOLE (FLAGYL) 500 MG tablet Take 1 tablet (500 mg total) by mouth 3 (three) times daily. 12/16/21   Plotnikov, Evie Lacks, MD   mometasone (NASONEX) 50 MCG/ACT nasal spray SHAKE LIQUID AND USE 2 SPRAYS IN EACH NOSTRIL DAILY 10/05/21   Plotnikov, Evie Lacks, MD  Multiple Vitamin (MULTIVITAMIN WITH MINERALS) TABS tablet Take 1 tablet by mouth daily.    [provider]  Multiple Vitamins-Minerals (PRESERVISION AREDS 2 PO) Take 1 tablet by mouth daily.    [provider]  mupirocin ointment (BACTROBAN) 2 % Apply topically. 08/20/21   [provider]  saccharomyces boulardii (FLORASTOR) 250 MG capsule Take 250 mg by mouth daily.    [provider]  torsemide (DEMADEX) 20 MG tablet Take 1-2 tablets (20-40 mg total) by mouth daily. 09/02/21   Plotnikov, Evie Lacks, MD  triamterene-hydrochlorothiazide (MAXZIDE-25) 37.5-25 MG tablet TAKE 1 TABLET DAILY 09/01/21   Plotnikov, Evie Lacks, MD  vitamin C (ASCORBIC ACID) 500 MG tablet Take 500 mg by mouth every evening.    [provider]    Physical Exam: Vitals:   12/23/21 1415 12/23/21 1430 12/23/21 1500 12/23/21 1735  BP:  (!) 154/68 (!) 167/79 (!) 174/86  Pulse: 88 94 92 (!) 104  Resp: 16  (!) 27 20  Temp:    98.2 F (36.8 C)  TempSrc:      SpO2: 95% 93% 91% 96%  Weight:      Height:       General:  Appears calm and comfortable and is in NAD Eyes:  PERRL, EOMI, normal lids, iris ENT:  grossly normal hearing, lips & tongue, mmm; appropriate dentition Neck:  no LAD, masses or thyromegaly Cardiovascular:  RRR, no m/r/g. 1-2+ R > L  LE edema (chronic).  Respiratory:   CTA bilaterally with no wheezes/rales/rhonchi.  Normal respiratory effort. Abdomen:  soft, NT, ND Skin:  no rash or induration seen on limited exam Musculoskeletal:  grossly normal tone BUE/BLE, good ROM, no bony abnormality Psychiatric:  grossly normal mood and affect, speech fluent and appropriate, AOx3 Neurologic:  CN 2-12 grossly intact, moves all extremities in coordinated fashion, sensation diminished in L hand with ?subtle weakness (but currently able to hold  objects)   Radiological Exams on Admission: Independently reviewed - see discussion in A/P where applicable  MR CERVICAL SPINE W WO CONTRAST  Result Date: 12/23/2021 CLINICAL DATA:  Left finger weakness EXAM: MRI CERVICAL SPINE WITHOUT AND WITH CONTRAST TECHNIQUE: Multiplanar and multiecho pulse sequences of the cervical spine, to include the craniocervical junction and cervicothoracic junction, were obtained without and with intravenous contrast. CONTRAST:  56mL GADAVIST GADOBUTROL 1 MMOL/ML IV SOLN COMPARISON:  None Available. FINDINGS: Alignment: There is trace anterolisthesis of C3 on C4 and trace retrolisthesis of C4 on C5. Vertebrae: No fracture, evidence of discitis, or bone lesion. Cord: Normal signal and morphology. Posterior Fossa, vertebral arteries, paraspinal tissues: Negative. Disc levels: C1-C2: Mild degenerative change. C2-C3: Moderate bilateral facet degenerative change. Minimal disc bulge. No spinal canal stenosis. No significant neural foraminal stenosis. C3-C4: Small central disc protrusion that contacts the ventral spinal cord. Severe bilateral facet degenerative change. Ligamentum flavum hypertrophy. Mild spinal canal stenosis. Uncovertebral hypertrophy. Mild bilateral neural foraminal stenosis. C4-C5: Moderate disc space loss. Circumferential disc bulge. Ligamentum flavum hypertrophy. Moderate bilateral facet degenerative change. Uncovertebral hypertrophy. Moderate to severe spinal canal stenosis. Severe bilateral neural foraminal stenosis. C5-C6: Ligamentum flavum hypertrophy. Circumferential disc bulge. Uncovertebral hypertrophy. Moderate bilateral facet degenerative change. Severe spinal canal and bilateral neural foraminal stenosis. C6-C7: Moderate disc space loss. Ligamentum flavum hypertrophy. Mild bilateral facet degenerative change. Uncovertebral hypertrophy. Severe bilateral neural foraminal stenosis. Severe spinal canal stenosis. C7-T1: Severe bilateral facet degenerative  change. Ligamentum flavum hypertrophy. No significant spinal canal or neural foraminal stenosis. IMPRESSION: 1. Midcervical spine predominant degenerative change with severe spinal canal stenosis at C4-C5, C5-C6, and C6-C7, as described above. 2. Severe bilateral neural foraminal stenosis is also noted at C4-C5, C5-C6, and C6-C7. 3. No evidence of cord signal abnormality or abnormal contrast enhancement. Electronically Signed   By: Marin Roberts M.D.   On: 12/23/2021 16:44   MR BRAIN WO CONTRAST  Result Date: 12/23/2021 CLINICAL DATA:  Left finger weakness EXAM: MRI HEAD WITHOUT CONTRAST TECHNIQUE: Multiplanar, multiecho pulse sequences of the brain and surrounding structures were obtained without intravenous contrast. COMPARISON:  None Available.  FINDINGS: Brain: Small acute infarct in the precentral gyrus on the right (series 5, image 88).There is sequela of severe chronic microvascular ischemic change. No extra-axial fluid collection. There is a small focus of microhemorrhage in the right cerebellar hemisphere. Vascular: Normal flow voids. Skull and upper cervical spine: No worrisome lesions in the skull. See separately dictated report for cervical spine findings. Sinuses/Orbits: Bilateral lens replacement. Likely a mucous retention cyst in the left maxillary sinus. Mild mucosal thickening bilateral ethmoid sinuses. Other: None IMPRESSION: Small focus of acute infarct in the precentral gyrus on the right. Electronically Signed   By: Marin Roberts M.D.   On: 12/23/2021 16:36   CT HEAD WO CONTRAST  Result Date: 12/23/2021 CLINICAL DATA:  Acute neuro deficit.  Rule out stroke.  Weakness EXAM: CT HEAD WITHOUT CONTRAST TECHNIQUE: Contiguous axial images were obtained from the base of the skull through the vertex without intravenous contrast. RADIATION DOSE REDUCTION: This exam was performed according to the departmental dose-optimization program which includes automated exposure control, adjustment of the mA  and/or kV according to patient size and/or use of iterative reconstruction technique. COMPARISON:  CT head 11/12/2009.  MRI head 02/22/2018 FINDINGS: Brain: Mild atrophy. Negative for hydrocephalus. Moderate to advanced white matter changes with diffuse white matter hypodensity. Negative for acute infarct, hemorrhage, mass Vascular: Negative for hyperdense vessel Skull: Negative Sinuses/Orbits: Mild mucosal edema paranasal sinuses. 5 mm osteoma left ethmoid sinus. Bilateral cataract extraction Other: None IMPRESSION: Atrophy and moderate to advanced chronic microvascular ischemia. No acute abnormality. Electronically Signed   By: Franchot Gallo M.D.   On: 12/23/2021 11:33   DG Chest 2 View  Result Date: 12/23/2021 CLINICAL DATA:  Short of breath EXAM: CHEST - 2 VIEW COMPARISON:  Chest 10/01/2020 FINDINGS: Cardiac enlargement without heart failure. Mild airspace disease in lung bases best seen on the lateral view. Probable atelectasis. No effusion. Lungs show mild hyperinflation. Atherosclerotic calcification aortic arch. IMPRESSION: 1. Cardiac enlargement without heart failure. 2. Mild bibasilar atelectasis. Electronically Signed   By: Franchot Gallo M.D.   On: 12/23/2021 11:27    EKG: Independently reviewed.  NSR with rate 91; incomplete RBBB; nonspecific ST changes with no evidence of acute ischemia   Labs on Admission: I have personally reviewed the available labs and imaging studies at the time of the admission.  Pertinent labs:    Glucose 129 BUN 33/Creatinine 1.15/GFR 46 BNP 94.8 Unremarkable CBC INR 1.1 ETOH <10   Assessment and Plan: Principal Problem:   Acute focal neurological deficit Active Problems:   Dyslipidemia   Gout   Essential hypertension   Hypoxemia   DNR (do not resuscitate)    Focal neurologic deficit -Patient with acute onset of L hand weakness/tingling -Concerning for TIA/CVA -Aspirin has been given to reduce stroke mortality and decrease  morbidity -Continue Plavix -Will place in observation status for CVA/TIA evaluation -Telemetry monitoring -MRI of brain and C-spine - small acute CVA and severe spinal stenosis -Echo -Neurology consult -PT/OT/Nutrition Consults  HTN -Allow permissive HTN for now -Treat BP only if >220/120, and then with goal of 15% reduction -Hold amlodipine, losartan, Maxzide and plan to restart in 48-72 hours   HLD -Continue Lipitor 40 mg daily   Gout -Continue allopurinol   Hypoxia, COPD -Patient has h/o COPD and uses nocturnal O2 only -She was 80% in the ER -This has improved, currently on 2L [place] O2 -She denies symptoms at this time so will follow without intervention other than supplemental O2 for now -Continue Trelegy  Neuropathy -Continue gabapentin  DNR -I have discussed code status with the patient and her granddaughter and  they are in agreement that the patient would not desire resuscitation and would prefer to die a natural death should that situation arise. -She will need a gold out of facility DNR form at the time of discharge     Advance Care Planning:   Code Status: DNR   Consults: Neurology; PT/OT/ST; TOC team; nutrition  DVT Prophylaxis: Lovenox  Family Communication: Granddaughter was present throughout evaluation  Severity of Illness: The appropriate patient status for this patient is OBSERVATION. Observation status is judged to be reasonable and necessary in order to provide the required intensity of service to ensure the patient's safety. The patient's presenting symptoms, physical exam findings, and initial radiographic and laboratory data in the context of their medical condition is felt to place them at decreased risk for further clinical deterioration. Furthermore, it is anticipated that the patient will be medically stable for discharge from the hospital within 2 midnights of admission.   Author: Karmen Bongo, MD 12/23/2021 7:13 PM  For on call review  [email or web address].

## 2021-12-23 NOTE — ED Provider Notes (Addendum)
[facility] EMERGENCY DEPARTMENT Provider Note   CSN: [id number] Arrival date & time: 12/23/21  1033     History  Chief Complaint  Patient presents with   Weakness    Gina [name] is a 88 y.o. female.   Weakness Patient presents with generalized weakness and difficulty moving her left hand.  States she does not feel as coordinated in the hand.  No headache.  No fevers.  No headache.  No neck pain.  Has felt generally weak 2.  Uses oxygen at night.  Did have some mild hypoxia initially upon arriving but now is improved.  No shoulder or elbow pain.  Has previous TIAs.    Past Medical History:  Diagnosis Date   Allergic rhinitis    seasonal   Arthritis    Colon cancer [facility]) Dec 12   Diverticulosis of colon (without mention of hemorrhage)    GI bleed    had cyst removed from spine....went back on plavix to soon   Gout    Hyperlipidemia    Hyperplastic colon polyp    Hypertension    Macular degeneration    PAD (peripheral artery disease) ([place])    Pneumonia    back in 05/2017   Stroke [facility])    TIA  10/2009   Unspecified transient cerebral ischemia sept '11    Home Medications Prior to Admission medications   Medication Sig Start Date End Date Taking? Authorizing Provider  acetaminophen (TYLENOL) 500 MG tablet Take 1,000 mg by mouth daily. 4 tabs daily    [provider]  allopurinol (ZYLOPRIM) 100 MG tablet TAKE 1 TABLET DAILY 09/01/21   Plotnikov, Evie Lacks, MD  amLODipine (NORVASC) 5 MG tablet TAKE 1 TABLET DAILY 09/01/21   Plotnikov, Evie Lacks, MD  atorvastatin (LIPITOR) 40 MG tablet Take 1 tablet (40 mg total) by mouth every evening. 10/01/20   Plotnikov, Evie Lacks, MD  Calcium Carbonate (CALCIUM 600 PO) Take 600 mg by mouth 2 (two) times daily.    [provider]  cetirizine (ZYRTEC) 10 MG tablet Take 1 tablet (10 mg total) by mouth 2 (two) times daily. 10/01/20   Plotnikov, Evie Lacks, MD  clopidogrel (PLAVIX) 75 MG tablet Take 1  tablet (75 mg total) by mouth daily. 10/01/20   Plotnikov, Evie Lacks, MD  Fluticasone-Umeclidin-Vilant (TRELEGY ELLIPTA) 100-62.5-25 MCG/ACT AEPB USE 1 INHALATION ORALLY    DAILY 10/20/21   Plotnikov, Evie Lacks, MD  gabapentin (NEURONTIN) 400 MG capsule TAKE 1 CAPSULE 4 TIMES     DAILY 12/09/21   Plotnikov, Evie Lacks, MD  ipratropium (ATROVENT) 0.06 % nasal spray Place 2 sprays into the nose 3 (three) times daily. 11/16/21 11/16/22  Plotnikov, Evie Lacks, MD  ketoconazole (NIZORAL) 2 % cream as needed. 09/10/19   [provider]  losartan (COZAAR) 50 MG tablet TAKE 1 TABLET DAILY 09/01/21   Plotnikov, Evie Lacks, MD  metroNIDAZOLE (FLAGYL) 500 MG tablet Take 1 tablet (500 mg total) by mouth 3 (three) times daily. 12/16/21   Plotnikov, Evie Lacks, MD  mometasone (NASONEX) 50 MCG/ACT nasal spray SHAKE LIQUID AND USE 2 SPRAYS IN EACH NOSTRIL DAILY 10/05/21   Plotnikov, Evie Lacks, MD  Multiple Vitamin (MULTIVITAMIN WITH MINERALS) TABS tablet Take 1 tablet by mouth daily.    [provider]  Multiple Vitamins-Minerals (PRESERVISION AREDS 2 PO) Take 1 tablet by mouth daily.    [provider]  mupirocin ointment (BACTROBAN) 2 % Apply topically. 08/20/21   [provider]  saccharomyces boulardii (FLORASTOR) 250 MG capsule Take 250 mg by mouth daily.    [provider]  torsemide (DEMADEX) 20 MG tablet Take 1-2 tablets (20-40 mg total) by mouth daily. 09/02/21   Plotnikov, Evie Lacks, MD  triamterene-hydrochlorothiazide (MAXZIDE-25) 37.5-25 MG tablet TAKE 1 TABLET DAILY 09/01/21   Plotnikov, Evie Lacks, MD  vitamin C (ASCORBIC ACID) 500 MG tablet Take 500 mg by mouth every evening.    [provider]      Allergies    Penicillins, Aspirin, Enalapril, Flonase [fluticasone], and Adhesive [tape]    Review of Systems   Review of Systems  Neurological:  Positive for weakness.    Physical Exam Updated Vital Signs BP (!) 183/78   Pulse 96   Temp 98.5 F (36.9 C)  (Oral)   Resp (!) 26   Ht 5' (1.524 m)   Wt 78 kg   SpO2 96%   BMI 33.59 kg/m  Physical Exam Vitals and nursing note reviewed.  HENT:     Head: Atraumatic.  Cardiovascular:     Rate and Rhythm: Regular rhythm.  Musculoskeletal:        General: No tenderness.     Right lower leg: Edema present.     Left lower leg: Edema present.  Skin:    General: Skin is warm.  Neurological:     Mental Status: She is alert.     Comments: Eye movements intact.  Face symmetric.  Good grip strength bilaterally.  May have some mild discoordination of the left hand.  Sensation grossly intact over both hands.     ED Results / Procedures / Treatments   Labs (all labs ordered are listed, but only abnormal results are displayed) Labs Reviewed  CBC - Abnormal; Notable for the following components:      Result Value   Hemoglobin 17.6 (*)    HCT 52.6 (*)    MCV 105.2 (*)    MCH 35.2 (*)    Platelets 146 (*)    All other components within normal limits  COMPREHENSIVE METABOLIC PANEL - Abnormal; Notable for the following components:   Glucose, Bld 129 (*)    BUN 33 (*)    Creatinine, Ser 1.15 (*)    Calcium 11.6 (*)    GFR, Estimated 46 (*)    All other components within normal limits  I-STAT CHEM 8, ED - Abnormal; Notable for the following components:   BUN 37 (*)    Creatinine, Ser 1.10 (*)    Glucose, Bld 134 (*)    Calcium, Ion 1.48 (*)    Hemoglobin 18.4 (*)    HCT 54.0 (*)    All other components within normal limits  RESP PANEL BY RT-PCR (FLU A&B, COVID) ARPGX2  ETHANOL  DIFFERENTIAL  BRAIN NATRIURETIC PEPTIDE  PROTIME-INR  APTT  URINALYSIS, ROUTINE W REFLEX MICROSCOPIC    EKG EKG Interpretation  Date/Time:  Thursday December 23 2021 10:39:16 EST Ventricular Rate:  91 PR Interval:  164 QRS Duration: 94 QT Interval:  370 QTC Calculation: 455 R Axis:   143 Text Interpretation: Normal sinus rhythm Incomplete right bundle branch block Right ventricular hypertrophy with  repolarization abnormality Septal infarct , age undetermined Abnormal ECG When compared with ECG of 22-Feb-2018 14:18, No significant change since last tracing Confirmed by Davonna Belling [phone]) on 12/23/2021 1:43:01 PM  Radiology CT HEAD WO CONTRAST  Result Date: 12/23/2021 CLINICAL DATA:  Acute neuro deficit.  Rule out stroke.  Weakness EXAM: CT HEAD WITHOUT CONTRAST  TECHNIQUE: Contiguous axial images were obtained from the base of the skull through the vertex without intravenous contrast. RADIATION DOSE REDUCTION: This exam was performed according to the departmental dose-optimization program which includes automated exposure control, adjustment of the mA and/or kV according to patient size and/or use of iterative reconstruction technique. COMPARISON:  CT head 11/12/2009.  MRI head 02/22/2018 FINDINGS: Brain: Mild atrophy. Negative for hydrocephalus. Moderate to advanced white matter changes with diffuse white matter hypodensity. Negative for acute infarct, hemorrhage, mass Vascular: Negative for hyperdense vessel Skull: Negative Sinuses/Orbits: Mild mucosal edema paranasal sinuses. 5 mm osteoma left ethmoid sinus. Bilateral cataract extraction Other: None IMPRESSION: Atrophy and moderate to advanced chronic microvascular ischemia. No acute abnormality. Electronically Signed   By: Franchot Gallo M.D.   On: 12/23/2021 11:33   DG Chest 2 View  Result Date: 12/23/2021 CLINICAL DATA:  Short of breath EXAM: CHEST - 2 VIEW COMPARISON:  Chest 10/01/2020 FINDINGS: Cardiac enlargement without heart failure. Mild airspace disease in lung bases best seen on the lateral view. Probable atelectasis. No effusion. Lungs show mild hyperinflation. Atherosclerotic calcification aortic arch. IMPRESSION: 1. Cardiac enlargement without heart failure. 2. Mild bibasilar atelectasis. Electronically Signed   By: Franchot Gallo M.D.   On: 12/23/2021 11:27    Procedures Procedures    Medications Ordered in ED Medications  - No data to display  ED Course/ Medical Decision Making/ A&P                           Medical Decision Making  Patient with discoordination of left hand.  Began earlier today.  Mild potential deficits.  Mildly complicated due to her macular degeneration.  Grossly has good strength.  Face symmetric.  Head CT reassuring.-Shows stable changes.  Initial mild hypoxia is resolved. Not a TNK candidate due to mild deficits.  However with continued deficits I feel he she would benefit from admission to the hospital for further work-up.  Lab work reassuring.  Kidney function and hemoglobin at baseline.  Respiratory panel still pending due to just generalized weakness.  Will discuss with hospitalist for admission.  Has had oxygen at home.  Has had CHF.  BNP normal        Final Clinical Impression(s) / ED Diagnoses Final diagnoses:  Focal neurological deficit    Rx / DC Orders ED Discharge Orders     None         Davonna Belling, MD 12/23/21 1345    Davonna Belling, MD 12/23/21 1357

## 2021-12-23 NOTE — TOC Initial Note (Addendum)
Transition of Care [facility]) - Initial/Assessment Note    Patient Details  Name: Gina [name] MRN: [id number] Date of Birth: Sep 12, 1931  Transition of Care [facility]) CM/SW Contact:    Verdell Carmine, RN Phone Number: 12/23/2021, 3:23 PM  Clinical Narrative:                  Patient presented with arm/ hand  dysfunction, under observation. The paitent wears oxygen at home at night. PT and OT consults in , patient does not have a qualifying stay since under OBS for SNF should this be recommended. Likely will go back home with support form family and home health if needed.  Do recommended palliative consult for [place] discussion.  CM will follow for any needs, recommendations, and transitions of care.   Expected Discharge Plan: [place] Barriers to Discharge: Continued Medical Work up   Patient Goals and CMS Choice        Expected Discharge Plan and Services Expected Discharge Plan: [place]       Living arrangements for the past 2 months: [place]                                      Prior Living Arrangements/Services Living arrangements for the past 2 months: Single Family Home   Patient language and need for interpreter reviewed:: Yes        Need for Family Participation in Patient Care: Yes (Comment) Care giver support system in place?: Yes (comment) Current home services: DME (Oxygen) Criminal Activity/Legal Involvement Pertinent to Current Situation/Hospitalization: No - Comment as needed  Activities of Daily Living      Permission Sought/Granted                  Emotional Assessment         Alcohol / Substance Use: Not Applicable Psych Involvement: No (comment)  Admission diagnosis:  Acute focal neurological deficit [R29.818] Patient Active Problem List   Diagnosis Date Noted   Acute focal neurological deficit 12/23/2021   Meteorism 12/16/2021   Polycythemia 11/18/2021   Hypercalcemia  11/18/2021   Vasomotor rhinitis 11/16/2021   Hypoxemia 09/21/2021   CHF (congestive heart failure) ([place]) 07/20/2021   Chronic pain of both feet 04/20/2021   Callus of foot 01/19/2021   DOE (dyspnea on exertion) 01/19/2021   Cough 10/01/2020   Deep retinal hemorrhage of right eye 10/01/2020   Progressive exudative retinopathy of right eye 10/01/2020   Atherosclerosis of aorta ([place]) 06/28/2020   Superficial phlebitis 02/04/2020   Wheezing 02/04/2020   Advanced nonexudative age-related macular degeneration of both eyes with subfoveal involvement 09/23/2019   Posterior vitreous detachment of both eyes 09/23/2019   [place] Needs syndrome 09/23/2019   Fall against object 06/03/2019   Obesity 10/01/2018   Macrocytic anemia 02/25/2018   TIA (transient ischemic attack) 02/22/2018   Acute lower UTI 02/22/2018   Bruising 11/20/2017   Ataxia 11/20/2017   Diarrhea 09/12/2017   Acute chest wall pain 06/14/2017   Acute bronchitis 05/25/2017   Abdominal distention 10/04/2016   Elevated glucose 02/18/2016   PAD (peripheral artery disease) ([place]) 03/17/2015   Dysuria 05/17/2014   Travel advice encounter 03/18/2014   Osteopenia 03/18/2014   Well adult exam 09/07/2013   Routine health maintenance 09/04/2011   Left otitis media 03/31/2011   Vertigo 03/31/2011   History of colon cancer  01/17/2011   Low back pain 10/25/2010   Unspecified transient cerebral ischemia 11/17/2009   HIP PAIN, LEFT 07/22/2009   Diverticulosis 04/24/2008   COLONIC POLYPS, HYPERPLASTIC, HX OF 04/24/2008   Dyslipidemia 09/08/2006   Gout 09/08/2006   Essential hypertension 09/08/2006   Allergic rhinitis 09/08/2006   PCP:  Cassandria Anger, MD Pharmacy:   [facility] [phone] [address], [address] AT [facility] OF [address] [place] [address] Phone: [phone] Fax: [phone]     Social Determinants of Health ([place]) Interventions    Readmission Risk  Interventions     No data to display

## 2021-12-23 NOTE — ED Notes (Signed)
Pt transported to MRI 

## 2021-12-23 NOTE — Telephone Encounter (Signed)
Called pt no answer LMOM RTC.../lmb 

## 2021-12-23 NOTE — Telephone Encounter (Signed)
Pt called requesting to talk to [facility] directly. Pt said she would rather discuss details of message to [place].  Please call pt at [phone]

## 2021-12-24 ENCOUNTER — Observation Stay (HOSPITAL_BASED_OUTPATIENT_CLINIC_OR_DEPARTMENT_OTHER): Payer: Medicare Other

## 2021-12-24 ENCOUNTER — Ambulatory Visit (HOSPITAL_COMMUNITY): Admit: 2021-12-24 | Payer: Medicare Other | Admitting: Cardiovascular Disease

## 2021-12-24 ENCOUNTER — Encounter (HOSPITAL_COMMUNITY)
Admission: EM | Disposition: A | Discharge status: Home Health | Payer: Self-pay | Source: Home / Self Care | Attending: Emergency Medicine

## 2021-12-24 ENCOUNTER — Other Ambulatory Visit (HOSPITAL_COMMUNITY): Payer: Self-pay

## 2021-12-24 ENCOUNTER — Observation Stay (HOSPITAL_COMMUNITY): Payer: Medicare Other

## 2021-12-24 DIAGNOSIS — J9611 Chronic respiratory failure with hypoxia: Secondary | ICD-10-CM | POA: Diagnosis not present

## 2021-12-24 DIAGNOSIS — R29898 Other symptoms and signs involving the musculoskeletal system: Secondary | ICD-10-CM | POA: Diagnosis not present

## 2021-12-24 DIAGNOSIS — I10 Essential (primary) hypertension: Secondary | ICD-10-CM | POA: Diagnosis not present

## 2021-12-24 DIAGNOSIS — I639 Cerebral infarction, unspecified: Secondary | ICD-10-CM | POA: Diagnosis not present

## 2021-12-24 DIAGNOSIS — D751 Secondary polycythemia: Secondary | ICD-10-CM

## 2021-12-24 DIAGNOSIS — R29818 Other symptoms and signs involving the nervous system: Secondary | ICD-10-CM | POA: Diagnosis not present

## 2021-12-24 DIAGNOSIS — I6389 Other cerebral infarction: Secondary | ICD-10-CM | POA: Diagnosis not present

## 2021-12-24 HISTORY — PX: LOOP RECORDER INSERTION: EP1214

## 2021-12-24 LAB — ECHOCARDIOGRAM COMPLETE
Area-P 1/2: 3.33 cm2
Calc EF: 56.7 %
Height: 60 in
MV VTI: 1.96 cm2
S' Lateral: 2.95 cm
Single Plane A2C EF: 53.6 %
Single Plane A4C EF: 59.9 %
Weight: 2752 oz

## 2021-12-24 LAB — LIPID PANEL
Cholesterol: 105 mg/dL (ref 0–200)
HDL: 44 mg/dL (ref >40)
LDL Cholesterol: 33 mg/dL (ref 0–99)
Total CHOL/HDL Ratio: 2.4 RATIO
Triglycerides: 139 mg/dL (ref <150)
VLDL: 28 mg/dL (ref 0–40)

## 2021-12-24 LAB — HEMOGLOBIN A1C
Hgb A1c MFr Bld: 6.3 % — ABNORMAL HIGH (ref 4.8–5.6)
Mean Plasma Glucose: 134.11 mg/dL

## 2021-12-24 SURGERY — LOOP RECORDER INSERTION

## 2021-12-24 MED ORDER — LIDOCAINE-EPINEPHRINE 1 %-1:100000 IJ SOLN
INTRAMUSCULAR | Status: AC
  Filled 2021-12-24: qty 1

## 2021-12-24 MED ORDER — TICAGRELOR 90 MG PO TABS: 90.0000 mg | ORAL_TABLET | Freq: Two times a day (BID) | ORAL | Status: DC

## 2021-12-24 MED ORDER — TICAGRELOR 90 MG PO TABS: 90.0000 mg | ORAL_TABLET | Freq: Two times a day (BID) | ORAL | 2 refills | Status: AC

## 2021-12-24 MED ORDER — PERFLUTREN LIPID MICROSPHERE
1.0000 mL | INTRAVENOUS | Status: AC | PRN
  Administered 2021-12-24: 2 mL via INTRAVENOUS

## 2021-12-24 MED ORDER — LIDOCAINE-EPINEPHRINE 1 %-1:100000 IJ SOLN
INTRAMUSCULAR | Status: DC | PRN
  Administered 2021-12-24: 10 mL

## 2021-12-24 SURGICAL SUPPLY — 2 items
MONITOR REVEAL LINQ II (Prosthesis & Implant Heart) IMPLANT
PACK LOOP INSERTION (CUSTOM PROCEDURE TRAY) ×1 IMPLANT

## 2021-12-24 NOTE — TOC Benefit Eligibility Note (Signed)
Patient Teacher, English as a foreign language completed.    The patient is currently admitted and upon discharge could be taking Brilinta 90 mg.  The current 30 day co-pay is $83.66.   The patient is insured through [employer] Medicare Part D     Lyndel Safe, [place] Patient Advocate Specialist [place] Patient Advocate Team Direct Number: [phone]  Fax: [phone]

## 2021-12-24 NOTE — Progress Notes (Signed)
Mobility Specialist: Progress Note   12/24/21 1516  Mobility  Activity Ambulated with assistance in hallway  Level of Assistance Contact guard assist, steadying assist  Assistive Device Front wheel walker  Distance Ambulated (ft) 140 ft  Activity Response Tolerated well  Mobility Referral Yes  $Mobility charge 1 Mobility   Pre-Mobility 2 L/min [place]: 90 HR, 90% SpO2 Post-Mobility 2 L/min [place]: 92 HR, 93% SpO2  Pt received sitting EOB and agreeable to mobility. No c/o throughout. Stopped x1 for standing break and to check pulse oximeter. SpO2 reading 82% but pts hands cold and she states she feels fine. Bumped pt up to 3 L/min [place] to finish ambulation. After returning to the room pt sats >90% on 2 L/min [place]. Pt sitting EOB with call bell and phone in reach.   MacArthur Alanda Colton Mobility Specialist Please contact via SecureChat or Rehab office at [phone]

## 2021-12-24 NOTE — Care Management Obs Status (Signed)
[place] NOTIFICATION   Patient Details  Name: MELISSAANN DIZDAREVIC MRN: [id number] Date of Birth: 04/08/1931   Medicare Observation Status Notification Given:  Yes    Coralee Pesa, [place] 12/24/2021, 3:37 PM

## 2021-12-24 NOTE — Evaluation (Signed)
Occupational Therapy Evaluation Patient Details Name: Gina [name] MRN: [id number] DOB: 10-06-1931 Today's Date: 12/24/2021   History of Present Illness Patient is a 88 y/o female who presents on 12/23/21 with difficulty using left hand. Brain MRI- acute infarct right precentral gyrus. PMH includes CVA, PAD, HTN, macular degeneration.   Clinical Impression   Prior to this admission, patient lives home alone and reports using SPC vs RW for ambulation. Pt is independent for ADLs and IADLs but does not drive. Has support from friends/family. Pt reports recent fall. Currently, patient is at her baseline for ADLs, with difficulty with L hand all but resolving. Assessment completed with LUE, now with full sensation and significant improvement in [facility], patient provided handout to increase [facility] for discharge home. Reiteration of BeFAST acronym utilized as patient's son present for evaluation. Patient with no acute OT needs at this time, therefore deferring to PT and mobility specialists to increase activity tolerance. OT signing off.      Recommendations for follow up therapy are one component of a multi-disciplinary discharge planning process, led by the attending physician.  Recommendations may be updated based on patient status, additional functional criteria and insurance authorization.   Follow Up Recommendations  No OT follow up    Assistance Recommended at Discharge PRN  Patient can return home with the following A little help with walking and/or transfers;Assist for transportation    Functional Status Assessment  Patient has had a recent decline in their functional status and demonstrates the ability to make significant improvements in function in a reasonable and predictable amount of time.  Equipment Recommendations  None recommended by OT    Recommendations for Other Services       Precautions / Restrictions Precautions Precautions: Fall Restrictions Weight Bearing  Restrictions: No      Mobility Bed Mobility                    Transfers                   General transfer comment: mobility deferred as patient had just gotten back into bed      Balance                                           ADL either performed or assessed with clinical judgement   ADL Overall ADL's : At baseline                                       General ADL Comments: Patient is now at her baseline for ADLs, with minimal extra time required     Vision Baseline Vision/History: 1 Wears glasses;6 Macular Degeneration Ability to See in Adequate Light: 1 Impaired Patient Visual Report: No change from baseline       Perception     Praxis      Pertinent Vitals/Pain Pain Assessment Pain Assessment: No/denies pain     Hand Dominance Right   Extremity/Trunk Assessment Upper Extremity Assessment Upper Extremity Assessment: LUE deficits/detail LUE Deficits / Details: Reports some difficulty with grasping with left hand, mild weakness noted otherwise functional during session. LUE Sensation: WNL LUE Coordination: WNL   Lower Extremity Assessment Lower Extremity Assessment: Defer to PT evaluation   Cervical / Trunk Assessment Cervical / Trunk  Assessment: Normal   Communication Communication Communication: No difficulties   Cognition Arousal/Alertness: Awake/alert Behavior During Therapy: WFL for tasks assessed/performed Overall Cognitive Status: Within Functional Limits for tasks assessed                                       General Comments  assessment completed with LUE, now with full sensation and significant improvement in [facility], patient provided handout to increase [facility] for discharge home    Exercises     Shoulder Instructions      Home Living Family/patient expects to be discharged to:: Private residence Living Arrangements: Alone Available Help at Discharge:  Family;Friend(s);Available PRN/intermittently;Neighbor Type of Home: House Home Access: Stairs to enter [employer] of Steps: 2 Entrance Stairs-Rails: Left Home Layout: Two level;Able to live on main level with bedroom/bathroom     Bathroom Shower/Tub: Occupational psychologist: Handicapped height Bathroom Accessibility: Yes   Home Equipment: Conservation officer, nature (2 wheels);Cane - single point;Shower seat - built in          Prior Functioning/Environment Prior Level of Function : Independent/Modified Independent             Mobility Comments: Uses SPC for daytime ambulation and RW for night time ambulation,. reports recent fall outside. Does not drive. Has friends/family assist with driving. ADLs Comments: independent        OT Problem List: Impaired UE functional use      OT Treatment/Interventions:      OT Goals(Current goals can be found in the care plan section) Acute Rehab OT Goals Patient Stated Goal: to get back to my independence OT Goal Formulation: With patient/family Time For Goal Achievement: 01/07/22 Potential to Achieve Goals: Good  OT Frequency:      Co-evaluation              AM-PAC OT "6 Clicks" Daily Activity     Outcome Measure Help from another person eating meals?: None Help from another person taking care of personal grooming?: None Help from another person toileting, which includes using toliet, bedpan, or urinal?: None Help from another person bathing (including washing, rinsing, drying)?: None Help from another person to put on and taking off regular upper body clothing?: None Help from another person to put on and taking off regular lower body clothing?: None 6 Click Score: 24   End of Session Equipment Utilized During Treatment: Other (comment) [facility] handout) Nurse Communication: Mobility status  Activity Tolerance: Patient tolerated treatment well Patient left: in bed;with call bell/phone within reach;with  family/visitor present  OT Visit Diagnosis: Muscle weakness (generalized) (M62.81)                Time: 1116-1130 OT Time Calculation (min): 14 min Charges:  OT General Charges $OT Visit: 1 Visit OT Evaluation $OT Eval Moderate Complexity: 1 Mod  Corinne Ports E. Artice Holohan, OTR/L Acute Rehabilitation Services [phone]   Ascencion Dike 12/24/2021, 1:03 PM

## 2021-12-24 NOTE — Consult Note (Addendum)
Neurology Consultation Reason for Consult: Left hand loss of dexterity Requesting Physician: Karmen Bongo  CC: Left hand clumsiness  History is obtained from: Patient and chart review  HPI: Gina [name] is a 88 y.o. female with a past medical history significant for hypertension, hyperlipidemia, prior TIAs, peripheral artery disease, congestive heart failure, COPD on oxygen at night (reports she is also to take oxygen during the day but does not for quality of life), colon cancer  She was feeling generally weak and the day before but otherwise was using her hands normally when she went to bed at night at 10 PM.  This morning when she was dressing herself she noted some difficulty with dexterity of her left hand having trouble hooking her bra.  She feels that this has resolved at this time but MRI showed an acute infarct for which neurology was consulted.  She also notes she was recently started on Flagyl for gas issues and concern for abdominal infection.  She denies any nausea or vomiting but notes she had constipation and then diarrhea.  She did not initially be the instructions on the Flagyl correctly and took only 1 pill on Friday followed by 2 pills on Saturday but took it 3 times a day as prescribed starting on Sunday until her arrival here.  She does note an allergy to aspirin and also notes that she had a skin tear after her last event monitor in 2020 and therefore she would not want another cardiac event monitor.  She is adherent to her Plavix  LKW: 10 PM on 11/8 Thrombolytic given?: No, out of the window IA performed?: No, exam not consistent with LVO Premorbid modified rankin scale:      2 - Slight disability. Able to look after own affairs without assistance, but unable to carry out all previous activities.  ROS: All other review of systems was negative except as noted in the HPI.   Past Medical History:  Diagnosis Date   Allergic rhinitis    seasonal   Arthritis     Colon cancer [facility]) Dec 12   Diverticulosis of colon (without mention of hemorrhage)    GI bleed    had cyst removed from spine....went back on plavix to soon   Gout    Hyperlipidemia    Hyperplastic colon polyp    Hypertension    Macular degeneration    PAD (peripheral artery disease) ([place])    Pneumonia    back in 05/2017   Stroke [facility])    TIA  10/2009   Unspecified transient cerebral ischemia sept '11   Past Surgical History:  Procedure Laterality Date   ABDOMINAL AORTOGRAM N/A 06/16/2017   Procedure: ABDOMINAL AORTOGRAM;  Surgeon: Angelia Mould, MD;  Location: [place] CV LAB;  Service: Cardiovascular;  Laterality: N/A;   APPENDECTOMY     BACK SURGERY  April '12   benign cyst spine ([place])   BREAST LUMPECTOMY     right breast '86   COLON SURGERY  Dec '12   colectomy for colon cancer ([place])   ENDARTERECTOMY FEMORAL Bilateral 08/02/2017   ENDARTERECTOMY FEMORAL Bilateral 08/02/2017   Procedure: Bilateral Femoral ENDARTERECTOMY with Patch Angioplasty;  Surgeon: Rosetta Posner, MD;  Location: [place];  Service: Vascular;  Laterality: Bilateral;   EYE SURGERY     bilateral cataracts   LOWER EXTREMITY ANGIOGRAPHY N/A 06/16/2017   Procedure: LOWER EXTREMITY ANGIOGRAPHY;  Surgeon: Angelia Mould, MD;  Location: [place] CV LAB;  Service: Cardiovascular;  Laterality: N/A;   PERIPHERAL VASCULAR INTERVENTION  06/16/2017   Procedure: PERIPHERAL VASCULAR INTERVENTION;  Surgeon: Angelia Mould, MD;  Location: [place] CV LAB;  Service: Cardiovascular;;  left common iliac   TONSILLECTOMY     Current Outpatient Medications  Medication Instructions   acetaminophen (TYLENOL) 500-1,000 mg, Oral, See admin instructions, Take 1,000 mg by mouth in the morning, 500 mg at 4 PM, and 500 mg at bedtime   allopurinol (ZYLOPRIM) 100 mg, Oral, Daily   amLODipine (NORVASC) 5 mg, Oral, Daily   ascorbic acid (VITAMIN C) 500 mg, Oral, Every evening   atorvastatin (LIPITOR) 40  mg, Oral, Every evening   Calcium Carbonate (CALCIUM 600 PO) 600 mg, Oral, 2 times daily   cetirizine (ZYRTEC) 10 mg, Oral, 2 times daily   clopidogrel (PLAVIX) 75 mg, Oral, Daily   Fluticasone-Umeclidin-Vilant (TRELEGY ELLIPTA) 100-62.5-25 MCG/ACT AEPB USE 1 INHALATION ORALLY    DAILY   gabapentin (NEURONTIN) 400 MG capsule TAKE 1 CAPSULE 4 TIMES     DAILY   losartan (COZAAR) 50 mg, Oral, Daily   metroNIDAZOLE (FLAGYL) 500 mg, Oral, 3 times daily   Multiple Vitamin (MULTIVITAMIN WITH MINERALS) TABS tablet 1 tablet, Oral, Daily with breakfast   Multiple Vitamins-Minerals (PRESERVISION AREDS 2 PO) 1 capsule, Oral, Every morning   mupirocin ointment (BACTROBAN) 2 % 1 Application, Topical, Daily PRN   OXYGEN 2 L/min, Inhalation, Daily at bedtime   sodium chloride (OCEAN) 0.65 % SOLN nasal spray 1 spray, Each Nare, As needed   torsemide (DEMADEX) 20-40 mg, Oral, Daily   triamterene-hydrochlorothiazide (MAXZIDE-25) 37.5-25 MG tablet 1 tablet, Oral, Daily     Family History  Problem Relation Age of Onset   Colon cancer Father    Hypertension Father    Hyperlipidemia Father    Coronary artery disease Father    Ulcers Father    Heart failure Mother    Coronary artery disease Mother    Breast cancer Mother    Hypertension Mother    Hyperlipidemia Mother    Asthma Mother    Diabetes Neg Hx     Social History:  reports that she quit smoking about 34 years ago. Her smoking use included cigarettes. She has a 45.00 pack-year smoking history. She has never used smokeless tobacco. She reports current alcohol use of about 14.0 standard drinks of alcohol per week. She reports that she does not use drugs.   Exam: Current vital signs: BP (!) 163/84   Pulse 95   Temp 99.2 F (37.3 C) (Oral)   Resp 14   Ht 5' (1.524 m)   Wt 78 kg   SpO2 90%   BMI 33.59 kg/m  Vital signs in last 24 hours: Temp:  [97.8 F (36.6 C)-99.2 F (37.3 C)] 99.2 F (37.3 C) (11/09 2200) Pulse Rate:  [87-104] 95  (11/10 0000) Resp:  [12-27] 14 (11/10 0000) BP: (145-183)/(63-86) 163/84 (11/10 0000) SpO2:  [80 %-97 %] 90 % (11/10 0000) Weight:  [78 kg] 78 kg (11/09 1055)   Physical Exam  Constitutional: Appears well-developed and well-nourished.  Psych: Affect somewhat flat but cooperative, slightly tangential at times Eyes: No scleral injection HENT: No oropharyngeal obstruction.  MSK: no joint deformities.  Cardiovascular: Normal rate and regular rhythm. Perfusing extremities well Respiratory: Nasal cannula in place, comfortable at rest GI: Soft.  No distension. There is no tenderness.  Skin: Chronic skin changes of the bilateral lower extremities, right greater than left with stable right lower extremity edema greater than left  Neuro: Mental Status: Patient is awake, alert, oriented to person, place, month, year, and situation. Patient is able to give a clear and coherent history. No signs of aphasia or neglect Cranial Nerves: II: Visual Fields are full. Pupils are equal, round, and reactive to light.   III,IV, VI: EOMI without ptosis or diploplia.  V: Facial sensation is symmetric to temperature VII: Facial movement is symmetric.  VIII: hearing is intact to voice X: Uvula elevates symmetrically XI: Shoulder shrug is symmetric. XII: tongue is midline without atrophy or fasciculations.  Motor: Tone is normal. Bulk is normal. 5/5 strength was present in all four extremities.  Sensory: Sensation is symmetric to light touch and temperature in the arms and legs. Deep Tendon Reflexes: 2+ and symmetric in the brachioradialis and patellae.  Plantars: Toes are downgoing bilaterally.  Cerebellar: FNF and HKS are intact bilaterally Gait:  Deferred in acute setting   NIHSS total 0 Performed at 2 AM    I have reviewed labs in epic and the results pertinent to this consultation are:  Basic Metabolic Panel: Recent Labs  Lab 12/23/21 1125 12/23/21 1220  NA 140 139  K 4.0 4.3  CL 99  100  CO2  --  29  GLUCOSE 134* 129*  BUN 37* 33*  CREATININE 1.10* 1.15*  CALCIUM  --  11.6*    CBC: Recent Labs  Lab 12/23/21 1107 12/23/21 1125  WBC 4.3  --   NEUTROABS 3.1  --   HGB 17.6* 18.4*  HCT 52.6* 54.0*  MCV 105.2*  --   PLT 146*  --     Coagulation Studies: Recent Labs    12/23/21 1229  LABPROT 14.3  INR 1.1    Lab Results  Component Value Date   HGBA1C 5.6 05/22/2019   Lab Results  Component Value Date   CHOL 186 05/22/2019   HDL 57.80 05/22/2019   LDLCALC 42 02/23/2018   LDLDIRECT 80.0 05/22/2019   TRIG 268.0 (H) 05/22/2019   CHOLHDL 3 05/22/2019    I have reviewed the images obtained:  Head CT personally reviewed, agree with radiology no acute intracranial process  MRI brain personally reviewed, agree with radiology:   Small focus of acute infarct in the precentral gyrus on the right.   MRI C-spine personally reviewed, agree with radiology:  1. Midcervical spine predominant degenerative change with severe spinal canal stenosis at C4-C5, C5-C6, and C6-C7, as described above. 2. Severe bilateral neural foraminal stenosis is also noted at C4-C5, C5-C6, and C6-C7. 3. No evidence of cord signal abnormality or abnormal contrast enhancement.    Impression: Embolic appearing stroke, etiology to be determined.  Atheroembolic versus cardioembolic  Recommendations: # Small punctate right motor hand stroke - Stroke labs  HgbA1c, fasting lipid panel - MRA of the brain without contrast and Carotid duplex  - Frequent neuro checks - Echocardiogram - continue home plavix, cannot add aspirin due to allergy  - Risk factor modification - Telemetry monitoring, please note patient would not want an event monitor on discharge, consider loop recorder if indicated - Blood pressure goal   - Permissive hypertension to 220/120 due to acute stroke for 24-48 hours - PT consult, OT consult, but no speech consult needed given no swallowing difficulty  - Stroke  team to follow  # C/f abdominal infection - defer to primary team if flagyl should be continued  Lesleigh Noe MD-PhD [facility] [phone] Available 7 PM to 7 AM, outside of these hours please call Neurologist on call  as listed on Amion.

## 2021-12-24 NOTE — ED Notes (Signed)
Pt brought back to ED 37. Pt to 3W-24 via stretcher by NT.

## 2021-12-24 NOTE — Progress Notes (Signed)
  Echocardiogram 2D Echocardiogram has been performed.  Gina [name] 12/24/2021, 10:50 AM

## 2021-12-24 NOTE — TOC Transition Note (Signed)
Transition of Care [facility]) - CM/SW Discharge Note   Patient Details  Name: Gina [name] MRN: [id number] Date of Birth: 1931/02/15  Transition of [facility]) CM/SW Contact:  Pollie Friar, RN Phone Number: 12/24/2021, 2:29 PM   Clinical Narrative:    Pt is from home alone. Son is in town and plans on staying through Sunday. He then plans to hire caregivers to provide some assistance for at least a week or so.  Pt has needed DME at home. She uses nocturnal oxygen at home through [place].  Pts friend provides her transportation or she uses Armed forces technical officer.  Pt to start on Brilinta. Her copays are $83.66/month. Pt states she can afford this cost.  Son to provide transport home.    Final next level of care: Home/Self Care Barriers to Discharge: No Barriers Identified   Patient Goals and CMS Choice        Discharge Placement                       Discharge Plan and Services                                     Social Determinants of Health (SDOH) Interventions     Readmission Risk Interventions     No data to display

## 2021-12-24 NOTE — Progress Notes (Addendum)
STROKE TEAM PROGRESS NOTE   INTERVAL HISTORY No family at the bedside. Echo being done. She is awake and alert. Oriented x4, no facial droop, slurred speech, tongue midline. Moves all extremities 5/5 in all 4 extremities. Left arm is a slightly slow on orbiting. Sensation symmetric Recommend loop recorder on discharge   Vitals:   12/23/21 2330 12/24/21 0000 12/24/21 0128 12/24/21 0337  BP: (!) 157/75 (!) 163/84 (!) 167/92 (!) 162/69  Pulse: 88 95 96 92  Resp: 12 14 20 18   Temp:   98.6 F (37 C) 98.8 F (37.1 C)  TempSrc:   Oral Oral  SpO2: 94% 90% 92% 91%  Weight:      Height:       CBC:  Recent Labs  Lab 12/23/21 1107 12/23/21 1125  WBC 4.3  --   NEUTROABS 3.1  --   HGB 17.6* 18.4*  HCT 52.6* 54.0*  MCV 105.2*  --   PLT 146*  --    Basic Metabolic Panel:  Recent Labs  Lab 12/23/21 1125 12/23/21 1220  NA 140 139  K 4.0 4.3  CL 99 100  CO2  --  29  GLUCOSE 134* 129*  BUN 37* 33*  CREATININE 1.10* 1.15*  CALCIUM  --  11.6*   Lipid Panel:  Recent Labs  Lab 12/24/21 0353  CHOL 105  TRIG 139  HDL 44  CHOLHDL 2.4  VLDL 28  LDLCALC 33   HgbA1c:  Recent Labs  Lab 12/24/21 0353  HGBA1C 6.3*   Urine Drug Screen: No results for input(s): "LABOPIA", "COCAINSCRNUR", "LABBENZ", "AMPHETMU", "THCU", "LABBARB" in the last 168 hours.  Alcohol Level  Recent Labs  Lab 12/23/21 1107  ETH <10    IMAGING past 24 hours MR ANGIO HEAD WO CONTRAST  Result Date: 12/24/2021 CLINICAL DATA:  Left hand weakness and numbness; small acute infarct on MRI EXAM: MRA HEAD WITHOUT CONTRAST TECHNIQUE: Angiographic images of the Circle of Willis were acquired using MRA technique without intravenous contrast. COMPARISON:  MRA head 11/13/2009, correlation is made with CTA head 02/22/2018 FINDINGS: Redemonstrated focus of restricted diffusion with ADC correlate in the right precentral gyrus (series 5, image 88), compatible with acute infarct. Anterior circulation: Both internal carotid  arteries are patent to the termini, without significant stenosis. A1 segments patent. Normal anterior communicating artery. Anterior cerebral arteries are patent to their distal aspects. No M1 stenosis or occlusion. Distal MCA branches perfused and symmetric. Posterior circulation: Vertebral arteries patent to the vertebrobasilar junction without stenosis. Posterior inferior cerebral arteries patent bilaterally. Basilar patent to its distal aspect. Superior cerebellar arteries patent bilaterally. Patent P1 segments. PCAs perfused to their distal aspects without stenosis. The bilateral posterior communicating arteries are not visualized. Anatomic variants: None significant IMPRESSION: No intracranial large vessel occlusion or significant stenosis. Electronically Signed   By: Merilyn Baba M.D.   On: 12/24/2021 03:23   MR CERVICAL SPINE W WO CONTRAST  Result Date: 12/23/2021 CLINICAL DATA:  Left finger weakness EXAM: MRI CERVICAL SPINE WITHOUT AND WITH CONTRAST TECHNIQUE: Multiplanar and multiecho pulse sequences of the cervical spine, to include the craniocervical junction and cervicothoracic junction, were obtained without and with intravenous contrast. CONTRAST:  36mL GADAVIST GADOBUTROL 1 MMOL/ML IV SOLN COMPARISON:  None Available. FINDINGS: Alignment: There is trace anterolisthesis of C3 on C4 and trace retrolisthesis of C4 on C5. Vertebrae: No fracture, evidence of discitis, or bone lesion. Cord: Normal signal and morphology. Posterior Fossa, vertebral arteries, paraspinal tissues: Negative. Disc levels: C1-C2: Mild degenerative  change. C2-C3: Moderate bilateral facet degenerative change. Minimal disc bulge. No spinal canal stenosis. No significant neural foraminal stenosis. C3-C4: Small central disc protrusion that contacts the ventral spinal cord. Severe bilateral facet degenerative change. Ligamentum flavum hypertrophy. Mild spinal canal stenosis. Uncovertebral hypertrophy. Mild bilateral neural foraminal  stenosis. C4-C5: Moderate disc space loss. Circumferential disc bulge. Ligamentum flavum hypertrophy. Moderate bilateral facet degenerative change. Uncovertebral hypertrophy. Moderate to severe spinal canal stenosis. Severe bilateral neural foraminal stenosis. C5-C6: Ligamentum flavum hypertrophy. Circumferential disc bulge. Uncovertebral hypertrophy. Moderate bilateral facet degenerative change. Severe spinal canal and bilateral neural foraminal stenosis. C6-C7: Moderate disc space loss. Ligamentum flavum hypertrophy. Mild bilateral facet degenerative change. Uncovertebral hypertrophy. Severe bilateral neural foraminal stenosis. Severe spinal canal stenosis. C7-T1: Severe bilateral facet degenerative change. Ligamentum flavum hypertrophy. No significant spinal canal or neural foraminal stenosis. IMPRESSION: 1. Midcervical spine predominant degenerative change with severe spinal canal stenosis at C4-C5, C5-C6, and C6-C7, as described above. 2. Severe bilateral neural foraminal stenosis is also noted at C4-C5, C5-C6, and C6-C7. 3. No evidence of cord signal abnormality or abnormal contrast enhancement. Electronically Signed   By: Marin Roberts M.D.   On: 12/23/2021 16:44   MR BRAIN WO CONTRAST  Result Date: 12/23/2021 CLINICAL DATA:  Left finger weakness EXAM: MRI HEAD WITHOUT CONTRAST TECHNIQUE: Multiplanar, multiecho pulse sequences of the brain and surrounding structures were obtained without intravenous contrast. COMPARISON:  None Available. FINDINGS: Brain: Small acute infarct in the precentral gyrus on the right (series 5, image 88).There is sequela of severe chronic microvascular ischemic change. No extra-axial fluid collection. There is a small focus of microhemorrhage in the right cerebellar hemisphere. Vascular: Normal flow voids. Skull and upper cervical spine: No worrisome lesions in the skull. See separately dictated report for cervical spine findings. Sinuses/Orbits: Bilateral lens replacement.  Likely a mucous retention cyst in the left maxillary sinus. Mild mucosal thickening bilateral ethmoid sinuses. Other: None IMPRESSION: Small focus of acute infarct in the precentral gyrus on the right. Electronically Signed   By: Marin Roberts M.D.   On: 12/23/2021 16:36   CT HEAD WO CONTRAST  Result Date: 12/23/2021 CLINICAL DATA:  Acute neuro deficit.  Rule out stroke.  Weakness EXAM: CT HEAD WITHOUT CONTRAST TECHNIQUE: Contiguous axial images were obtained from the base of the skull through the vertex without intravenous contrast. RADIATION DOSE REDUCTION: This exam was performed according to the departmental dose-optimization program which includes automated exposure control, adjustment of the mA and/or kV according to patient size and/or use of iterative reconstruction technique. COMPARISON:  CT head 11/12/2009.  MRI head 02/22/2018 FINDINGS: Brain: Mild atrophy. Negative for hydrocephalus. Moderate to advanced white matter changes with diffuse white matter hypodensity. Negative for acute infarct, hemorrhage, mass Vascular: Negative for hyperdense vessel Skull: Negative Sinuses/Orbits: Mild mucosal edema paranasal sinuses. 5 mm osteoma left ethmoid sinus. Bilateral cataract extraction Other: None IMPRESSION: Atrophy and moderate to advanced chronic microvascular ischemia. No acute abnormality. Electronically Signed   By: Franchot Gallo M.D.   On: 12/23/2021 11:33   DG Chest 2 View  Result Date: 12/23/2021 CLINICAL DATA:  Short of breath EXAM: CHEST - 2 VIEW COMPARISON:  Chest 10/01/2020 FINDINGS: Cardiac enlargement without heart failure. Mild airspace disease in lung bases best seen on the lateral view. Probable atelectasis. No effusion. Lungs show mild hyperinflation. Atherosclerotic calcification aortic arch. IMPRESSION: 1. Cardiac enlargement without heart failure. 2. Mild bibasilar atelectasis. Electronically Signed   By: Franchot Gallo M.D.   On: 12/23/2021 11:27  PHYSICAL EXAM  Temp:  [97.8  F (36.6 C)-99.2 F (37.3 C)] 98.7 F (37.1 C) (11/10 1133) Pulse Rate:  [87-104] 92 (11/10 1133) Resp:  [12-27] (P) 18 (11/10 1133) BP: (145-183)/(68-92) 155/75 (11/10 1133) SpO2:  [80 %-97 %] 90 % (11/10 1135)  General - Well nourished, well developed pleasant elderly Caucasian lady, in no apparent distress. Cardiovascular - Regular rhythm and rate.  Mental Status -  Level of arousal and orientation to time, place, and person were intact. Language including expression, naming, repetition, comprehension was assessed and found intact. Attention span and concentration were normal. Recent and remote memory were intact. Fund of Knowledge was assessed and was intact.  Cranial Nerves II - XII - II - Visual field intact OU. III, IV, VI - Extraocular movements intact. V - Facial sensation intact bilaterally. VII - Facial movement intact bilaterally. VIII - Hearing & vestibular intact bilaterally. X - Palate elevates symmetrically. XI - Chin turning & shoulder shrug intact bilaterally. XII - Tongue protrusion intact.  Motor Strength - The patient's strength was normal in all extremities and pronator drift was absent.  Bulk was normal and fasciculations were absent.   Motor Tone - Muscle tone was assessed at the neck and appendages and was normal.. Diminished fine finger movements on the left.  Orbits right over left upper extremity. Sensory - Light touch, temperature/pinprick were assessed and were symmetrical.    Coordination - The patient had normal movements in the hands and feet with no ataxia or dysmetria.  Tremor was absent.  Gait and Station - deferred.  ASSESSMENT/PLAN Gina [name] is a 88 y.o. female with history of  remote colon cancer; HTN; HLD; TIA; and PAD presenting with L hand weakness.  She reports that she noticed acute onset of L hand numbness/tingling and inability to hold objects this AM.     Stroke  Acute small right precentral gyrus ischemic infarct   Etiology:  likely cardioembolic   CT head Atrophy and moderate to advanced chronic microvascular ischemia. No acute abnormality.  MRI  Small focus of acute infarct in the precentral gyrus on the right.  MRA  No intracranial large vessel occlusion or significant stenosis.  Carotid Doppler  pending 2D Echo pending Recommend loop recorder at discharge  LDL 33 HgbA1c 6.3 VTE prophylaxis - Lovenox/ SCD's     Diet   Diet Heart Room service appropriate? Yes; Fluid consistency: Thin   clopidogrel 75 mg daily prior to admission, now on clopidogrel 75 mg daily. Consider changing Plavix to Brilinta. Will need to check into cost. Has allergy to ASA with hives  Therapy recommendations:  pending  Disposition:  pending  Hypertension Home meds:  norvasc, losartan  Stable Permissive hypertension (OK if < 220/120) but gradually normalize in 5-7 days Long-term BP goal normotensive  Hyperlipidemia Home meds:  atorvastatin 40mg , resumed in hospital LDL 33, goal < 70 Continue statin at discharge  Other Stroke Risk Factors Advanced Age >/= 80  Obesity, Body mass index is 33.59 kg/m., BMI >/= 30 associated with increased stroke risk, recommend weight loss, diet and exercise as appropriate  Hx stroke/TIA  Other Active Problems Macular degeneration  Neuropathy  COPD  Hospital day # 0  Beulah Gandy DNP, ACN[facility]AG   STROKE MD NOTE :  I have personally obtained history,examined this patient, reviewed notes, independently viewed imaging studies, participated in medical decision making and plan of care.ROS completed by me personally and pertinent positives fully documented  I have made any  additions or clarifications directly to the above note. Agree with note above.  Patient presented with sudden onset of left hand weakness due to small right frontal infarct likely of embolic etiology from cryptogenic source.  Recommend change Plavix to Brilinta alone as patient is allergic to aspirin for stroke  prevention if she can afford it.  Continue ongoing stroke work-up.  Aggressive risk factor modification.  Loop recorder at discharge to look for paroxysmal A-fib.  Follow-up as an outpatient in the stroke clinic in 2 months.  Greater than 50% time during this 50-minute visit was spent in counseling and coordination of care about her cryptogenic stroke and discussion about evaluation and treatment and answering questions.  Discussed with Dr. Sarajane Jews.  Antony Contras, MD Medical Director [facility] Pager: [phone] 12/24/2021 3:05 PM  To contact Stroke Continuity provider, please refer to [email or web address] After hours, contact General Neurology

## 2021-12-24 NOTE — ED Notes (Signed)
Pt to MRI and will then go to 3W.

## 2021-12-24 NOTE — Discharge Instructions (Signed)
Care After Your Loop Recorder   Monitor your cardiac device site for redness, swelling, and drainage. Call the device clinic at [phone] if you experience these symptoms or fever/chills.  If you notice bleeding from your site, hold firm, but gently pressure with two fingers for 5 minutes. Dried blood on the steri-strips when removing the outer bandage is normal.   Keep the large square bandage on your site for 24 hours and then you may remove it yourself. Keep the steri-strips underneath in place.   You may shower after 72 hours / 3 days from your procedure with the steri-strips in place. They will usually fall off on their own, or may be removed after 10 days. Pat dry.   Avoid lotions, ointments, or perfumes over your incision until it is well-healed.  Please do not submerge in water until your site is completely healed.   Your device is MRI compatible.   Remote monitoring is used to monitor your cardiac device from home. This monitoring is scheduled every month by our office. It allows [place] to keep an eye on the function of your device to ensure it is working properly.

## 2021-12-24 NOTE — Consult Note (Addendum)
ELECTROPHYSIOLOGY CONSULT NOTE  Patient ID: Gina [name] MRN: [id number], DOB/AGE: 86-Sep-1933   Admit date: 12/23/2021 Date of Consult: 12/24/2021  Primary Physician: Cassandria Anger, MD Primary Cardiologist: None  Primary Electrophysiologist: Dr. Quentin Ore -> Dr. Myles Gip Reason for Consultation: Cryptogenic stroke; recommendations regarding Implantable Loop Recorder Insurance: Medicare  History of Present Illness EP has been asked to evaluate Gina [name] for placement of an implantable loop recorder to monitor for atrial fibrillation by Dr Leonie Man.  The patient was admitted on 12/23/2021 with L hand weakness, numbness, and inability to hold objects.    Imaging demonstrated acute small right precentral gyrus ischemic infarct, felt to likely be cardioembolic.    She has undergone workup for stroke:  CT head Atrophy and moderate to advanced chronic microvascular ischemia. No acute abnormality. MRI  Small focus of acute infarct in the precentral gyrus on the right.  MRA  No intracranial large vessel occlusion or significant stenosis.  Carotid Doppler  1-39% velocities bilaterally 2D Echo EF 60-65%, grade 1 DD, LA 4.1 cm LDL 33 HgbA1c 6.3 VTE prophylaxis - Lovenox/ SCD's   clopidogrel 75 mg daily prior to admission, now on clopidogrel 75 mg daily. Consider changing Plavix to Brilinta. Will need to check into cost. Has allergy to ASA with hives  Therapy recommendations:  No f/u Disposition:  Home   The patient has been monitored on telemetry which has demonstrated sinus rhythm with no arrhythmias.  Inpatient stroke work-up will not require a TEE per Neurology.   Echocardiogram as above. Lab work is reviewed.  Prior to admission, the patient denies chest pain, shortness of breath, dizziness, palpitations, or syncope.  She is recovering from her stroke with plans to return home  at discharge.  Past Medical History:  Diagnosis Date   Allergic rhinitis    seasonal    Arthritis    Colon cancer [facility]) Dec 12   Diverticulosis of colon (without mention of hemorrhage)    GI bleed    had cyst removed from spine....went back on plavix to soon   Gout    Hyperlipidemia    Hyperplastic colon polyp    Hypertension    Macular degeneration    PAD (peripheral artery disease) ([place])    Pneumonia    back in 05/2017   Stroke [facility])    TIA  10/2009   Unspecified transient cerebral ischemia sept '11     Surgical History:  Past Surgical History:  Procedure Laterality Date   ABDOMINAL AORTOGRAM N/A 06/16/2017   Procedure: ABDOMINAL AORTOGRAM;  Surgeon: Angelia Mould, MD;  Location: [place] [facility];  Service: Cardiovascular;  Laterality: N/A;   APPENDECTOMY     BACK SURGERY  April '12   benign cyst spine ([place])   BREAST LUMPECTOMY     right breast '86   COLON SURGERY  Dec '12   colectomy for colon cancer ([place])   ENDARTERECTOMY FEMORAL Bilateral 08/02/2017   ENDARTERECTOMY FEMORAL Bilateral 08/02/2017   Procedure: Bilateral Femoral ENDARTERECTOMY with Patch Angioplasty;  Surgeon: Rosetta Posner, MD;  Location: [place];  Service: Vascular;  Laterality: Bilateral;   EYE SURGERY     bilateral cataracts   LOWER EXTREMITY ANGIOGRAPHY N/A 06/16/2017   Procedure: LOWER EXTREMITY ANGIOGRAPHY;  Surgeon: Angelia Mould, MD;  Location: [place] [facility];  Service: Cardiovascular;  Laterality: N/A;   PERIPHERAL VASCULAR INTERVENTION  06/16/2017   Procedure: PERIPHERAL VASCULAR INTERVENTION;  Surgeon: Angelia Mould, MD;  Location: [facility] IN[facility]AS[facility]  LAB;  Service: Cardiovascular;;  left common iliac   TONSILLECTOMY       Medications Prior to Admission  Medication Sig Dispense Refill Last Dose   acetaminophen (TYLENOL) 500 MG tablet Take 500-1,000 mg by mouth See admin instructions. Take 1,000 mg by mouth in the morning, 500 mg at 4 PM, and 500 mg at bedtime   12/22/2021 at pm   allopurinol (ZYLOPRIM) 100 MG tablet TAKE 1 TABLET DAILY (Patient taking  differently: Take 100 mg by mouth in the morning.) 90 tablet 3 12/22/2021 at am   amLODipine (NORVASC) 5 MG tablet TAKE 1 TABLET DAILY 90 tablet 3 12/22/2021 at am   atorvastatin (LIPITOR) 40 MG tablet Take 1 tablet (40 mg total) by mouth every evening. 90 tablet 3 12/22/2021 at pm   Calcium Carbonate (CALCIUM 600 PO) Take 600 mg by mouth in the morning and at bedtime.   12/22/2021   cetirizine (ZYRTEC) 10 MG tablet Take 1 tablet (10 mg total) by mouth 2 (two) times daily. (Patient taking differently: Take 10 mg by mouth in the morning.) 90 tablet 3 12/22/2021 at am   clopidogrel (PLAVIX) 75 MG tablet Take 1 tablet (75 mg total) by mouth daily. 90 tablet 3 12/22/2021 at 1000   Fluticasone-Umeclidin-Vilant (TRELEGY ELLIPTA) 100-62.5-25 MCG/ACT AEPB USE 1 INHALATION ORALLY    DAILY (Patient taking differently: Inhale 1 puff into the lungs daily in the afternoon.) 180 each 3 12/22/2021   gabapentin (NEURONTIN) 400 MG capsule TAKE 1 CAPSULE 4 TIMES     DAILY (Patient taking differently: Take 400 mg by mouth See admin instructions. Take 400 mg by mouth at 4 AM, in the morning, 4 PM, and bedtime) 360 capsule 3 12/22/2021 at pm   losartan (COZAAR) 50 MG tablet TAKE 1 TABLET DAILY (Patient taking differently: Take 50 mg by mouth in the morning.) 90 tablet 3 12/22/2021   metroNIDAZOLE (FLAGYL) 500 MG tablet Take 1 tablet (500 mg total) by mouth 3 (three) times daily. 30 tablet 1 12/22/2021   Multiple Vitamin (MULTIVITAMIN WITH MINERALS) TABS tablet Take 1 tablet by mouth daily with breakfast.   12/22/2021 at am   Multiple Vitamins-Minerals (PRESERVISION AREDS 2 PO) Take 1 capsule by mouth in the morning.   12/22/2021 at am   mupirocin ointment (BACTROBAN) 2 % Apply 1 Application topically daily as needed (for irritation- affected areas).   unk   OXYGEN Inhale 2 L/min into the lungs at bedtime.   12/22/2021 at pm   sodium chloride (OCEAN) 0.65 % SOLN nasal spray Place 1 spray into both nostrils as needed for congestion.    Past Week   torsemide (DEMADEX) 20 MG tablet Take 1-2 tablets (20-40 mg total) by mouth daily. (Patient taking differently: Take 20-40 mg by mouth in the morning.) 60 tablet 5 12/22/2021 at am   triamterene-hydrochlorothiazide (MAXZIDE-25) 37.5-25 MG tablet TAKE 1 TABLET DAILY (Patient taking differently: Take 1 tablet by mouth in the morning.) 90 tablet 3 12/22/2021 at am   vitamin C (ASCORBIC ACID) 500 MG tablet Take 500 mg by mouth every evening.   12/22/2021 at pm    Inpatient Medications:    stroke: early stages of recovery book   Does not apply Once   allopurinol  100 mg Oral q AM   atorvastatin  40 mg Oral QPM   enoxaparin (LOVENOX) injection  40 mg Subcutaneous Q24H   fluticasone furoate-vilanterol  1 puff Inhalation Daily   And   umeclidinium bromide  1 puff Inhalation Daily  gabapentin  400 mg Oral TID   loratadine  10 mg Oral Daily   [START ON 12/25/2021] ticagrelor  90 mg Oral BID    Allergies:  Allergies  Allergen Reactions   Penicillins Hives    Has patient had a PCN reaction causing immediate rash, facial/tongue/throat swelling, SOB or lightheadedness with hypotension:## yes ## Has patient had a PCN reaction causing severe rash involving mucus membranes or skin necrosis: no Has patient had a PCN reaction that required hospitalization: no Has patient had a PCN reaction occurring within the last 10 years: no If all of the above answers are "NO", then may proceed with Cephalosporin use.    Aspirin Hives   Enalapril Other (See Comments) and Cough    Wheezing, also   Flonase [Fluticasone] Other (See Comments)    Nose bleeds   Adhesive [Tape] Other (See Comments)    "pulls skin off"    Social History   Socioeconomic History   Marital status: Widowed    Spouse name: Not on file   Number of children: 3   Years of education: 14   Highest education level: Not on file  Occupational History   Occupation: Tree surgeon: RETIRED    Comment: retired  Tobacco  Use   Smoking status: Former    Packs/day: 1.50    Years: 30.00    Total pack years: 45.00    Types: Cigarettes    Quit date: 07/21/1987    Years since quitting: 34.4   Smokeless tobacco: Never  Vaping Use   Vaping Use: Never used  Substance and Sexual Activity   Alcohol use: Yes    Alcohol/week: 14.0 standard drinks of alcohol    Types: 14 Glasses of wine per week    Comment: Wine daily   Drug use: No   Sexual activity: Not Currently  Other Topics Concern   Not on file  Social History Narrative   HSG, Married 1955 widowed 2010. 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 Passed away 02-Jul-2008 Her son  moved to [place] (July '10). End of life care:  no extra ordinary measures: wants cardiac resusitation and mechanical ventilation short-term if needed. Does not want to be kept in a persistant vegative state or long term artifical life support.    Social Determinants of Health   Financial Resource Strain: Low Risk  (07/19/2021)   Overall Financial Resource Strain (CARDIA)    Difficulty of Paying Living Expenses: Not hard at all  Food Insecurity: No Food Insecurity (07/19/2021)   Hunger Vital Sign    Worried About Running Out of Food in the Last Year: Never true    Ran Out of Food in the Last Year: Never true  Transportation Needs: No Transportation Needs (07/19/2021)   PRAPARE - Hydrologist (Medical): No    Lack of Transportation (Non-Medical): No  Physical Activity: Inactive (07/19/2021)   Exercise Vital Sign    Days of Exercise per Week: 0 days    Minutes of Exercise per Session: 0 min  Stress: No Stress Concern Present (07/19/2021)   [place]    Feeling of Stress : Not at all  Social Connections: Moderately Integrated  (07/19/2021)   Social Connection and Isolation Panel [NHANES]    Frequency of Communication  with Friends and Family: Three times a week    Frequency of Social Gatherings with Friends and Family: Three times a week    Attends Religious Services: More than 4 times per year    Active Member of Clubs or Organizations: Yes    Attends Archivist Meetings: More than 4 times per year    Marital Status: Widowed  Intimate Partner Violence: Not At Risk (07/19/2021)   Humiliation, Afraid, Rape, and Kick questionnaire    Fear of Current or Ex-Partner: No    Emotionally Abused: No    Physically Abused: No    Sexually Abused: No     Family History  Problem Relation Age of Onset   Colon cancer Father    Hypertension Father    Hyperlipidemia Father    Coronary artery disease Father    Ulcers Father    Heart failure Mother    Coronary artery disease Mother    Breast cancer Mother    Hypertension Mother    Hyperlipidemia Mother    Asthma Mother    Diabetes Neg Hx       Review of Systems: All other systems reviewed and are otherwise negative except as noted above.  Physical Exam: Vitals:   12/24/21 0128 12/24/21 0337 12/24/21 1133 12/24/21 1135  BP: (!) 167/92 (!) 162/69 (!) 155/75   Pulse: 96 92 92   Resp: 20 18 18    Temp: 98.6 F (37 C) 98.8 F (37.1 C) 98.7 F (37.1 C)   TempSrc: Oral Oral Oral   SpO2: 92% 91% (!) 80% 90%  Weight:      Height:        GEN- The patient is well appearing, alert and oriented x 3 today.   Head- normocephalic, atraumatic Eyes-  Sclera clear, conjunctiva pink Ears- hearing intact Oropharynx- clear Neck- supple Lungs- Clear to ausculation bilaterally, normal work of breathing Heart- Regular rate and rhythm, no murmurs, rubs or gallops  GI- soft, NT, ND, + BS Extremities- no clubbing, cyanosis, or edema MS- no significant deformity or atrophy Skin- no rash or lesion Psych- euthymic mood, full affect Neuro- No focal deficits.   Labs:    Lab Results  Component Value Date   WBC 4.3 12/23/2021   HGB 18.4 (H) 12/23/2021   HCT 54.0 (H) 12/23/2021   MCV 105.2 (H) 12/23/2021   PLT 146 (L) 12/23/2021    Recent Labs  Lab 12/23/21 1220  NA 139  K 4.3  CL 100  CO2 29  BUN 33*  CREATININE 1.15*  CALCIUM 11.6*  PROT 7.0  BILITOT 0.9  ALKPHOS 50  ALT 17  AST 30  GLUCOSE 129*     Radiology/Studies: ECHOCARDIOGRAM COMPLETE  Result Date: 12/24/2021    ECHOCARDIOGRAM REPORT   Patient Name:   JOANNAH GITLIN Date of Exam: 12/24/2021 Medical Rec #:  [id number]         Height:       60.0 in Accession #:    [id number]        Weight:       172.0 lb Date of Birth:  06-Jul-1931         BSA:          1.751 m Patient Age:    68 years          BP:           162/69 mmHg Patient Gender: F  HR:           90 bpm. Exam Location:  Inpatient Procedure: 2D Echo, Color Doppler, Cardiac Doppler and Intracardiac            Opacification Agent Indications:    Stroke  History:        Patient has prior history of Echocardiogram examinations, most                 recent 02/23/2018. CHF, TIA, PAD and COPD; Risk                 Factors:Hypertension and Dyslipidemia. Cancer.  Sonographer:    Eartha Inch Referring Phys: [place]  Sonographer Comments: Technically difficult study due to poor echo windows. Image acquisition challenging due to patient body habitus, Image acquisition challenging due to respiratory motion and Image acquisition challenging due to COPD. IMPRESSIONS  1. Left ventricular ejection fraction, by estimation, is 60 to 65%. Left ventricular ejection fraction by PLAX is 62 %. The left ventricle has normal function. The left ventricle has no regional wall motion abnormalities. Left ventricular diastolic parameters are consistent with Grade I diastolic dysfunction (impaired relaxation). Elevated left ventricular end-diastolic pressure. The E/e' is 68.  2. Right ventricular systolic function is mildly reduced. The right  ventricular size is normal. There is normal pulmonary artery systolic pressure. The estimated right ventricular systolic pressure is 14.4 mmHg.  3. The mitral valve is abnormal. Trivial mitral valve regurgitation.  4. The aortic valve is tricuspid. Aortic valve regurgitation is not visualized. Aortic valve sclerosis/calcification is present, without any evidence of aortic stenosis.  5. The inferior vena cava is normal in size with greater than 50% respiratory variability, suggesting right atrial pressure of 3 mmHg. Comparison(s): Changes from prior study are noted. 02/23/2018: LVEF 55-60%, grade 1 DD, trivial pericardial effusion. FINDINGS  Left Ventricle: Left ventricular ejection fraction, by estimation, is 60 to 65%. Left ventricular ejection fraction by PLAX is 62 %. The left ventricle has normal function. The left ventricle has no regional wall motion abnormalities. Definity contrast agent was given IV to delineate the left ventricular endocardial borders. The left ventricular internal cavity size was normal in size. There is no left ventricular hypertrophy. Left ventricular diastolic parameters are consistent with Grade I diastolic dysfunction (impaired relaxation). Elevated left ventricular end-diastolic pressure. The E/e' is 46. Right Ventricle: The right ventricular size is normal. No increase in right ventricular wall thickness. Right ventricular systolic function is mildly reduced. There is normal pulmonary artery systolic pressure. The tricuspid regurgitant velocity is 2.51 m/s, and with an assumed right atrial pressure of 3 mmHg, the estimated right ventricular systolic pressure is 31.5 mmHg. Left Atrium: Left atrial size was normal in size. Right Atrium: Right atrial size was normal in size. Pericardium: Trivial pericardial effusion is present. The pericardial effusion is posterior to the left ventricle. Mitral Valve: The mitral valve is abnormal. Mild to moderate mitral annular calcification. Trivial  mitral valve regurgitation. MV peak gradient, 8.2 mmHg. The mean mitral valve gradient is 4.0 mmHg. Tricuspid Valve: The tricuspid valve is grossly normal. Tricuspid valve regurgitation is trivial. Aortic Valve: The aortic valve is tricuspid. Aortic valve regurgitation is not visualized. Aortic valve sclerosis/calcification is present, without any evidence of aortic stenosis. Pulmonic Valve: The pulmonic valve was grossly normal. Pulmonic valve regurgitation is trivial. Aorta: The aortic root and ascending aorta are structurally normal, with no evidence of dilitation. Venous: The inferior vena cava is normal in size with greater than  50% respiratory variability, suggesting right atrial pressure of 3 mmHg. IAS/Shunts: No atrial level shunt detected by color flow Doppler.  LEFT VENTRICLE PLAX 2D LV EF:         Left            Diastology                ventricular     LV e' medial:    4.38 cm/s                ejection        LV E/e' medial:  17.9                fraction by     LV e' lateral:   4.03 cm/s                PLAX is 62      LV E/e' lateral: 19.5                %. LVIDd:         4.40 cm LVIDs:         2.95 cm LV PW:         0.70 cm LV IVS:        0.70 cm LVOT diam:     1.90 cm LV SV:         53 LV SV Index:   30 LVOT Area:     2.84 cm  LV Volumes (MOD) LV vol d, MOD    49.4 ml A2C: LV vol d, MOD    88.0 ml A4C: LV vol s, MOD    22.9 ml A2C: LV vol s, MOD    35.3 ml A4C: LV SV MOD A2C:   26.5 ml LV SV MOD A4C:   88.0 ml LV SV MOD BP:    37.9 ml RIGHT VENTRICLE            IVC RV S prime:     9.90 cm/s  IVC diam: 0.70 cm TAPSE (M-mode): 1.2 cm LEFT ATRIUM             Index        RIGHT ATRIUM           Index LA diam:        4.10 cm 2.34 cm/m   RA Area:     17.70 cm LA Vol (A2C):   65.6 ml 37.47 ml/m  RA Volume:   49.00 ml  27.99 ml/m LA Vol (A4C):   45.6 ml 26.05 ml/m LA Biplane Vol: 55.1 ml 31.47 ml/m  AORTIC VALVE             PULMONIC VALVE LVOT Vmax:   96.10 cm/s  PR End Diast Vel: 7.29 msec LVOT Vmean:   62.500 cm/s LVOT VTI:    0.188 m  AORTA Ao Root diam: 2.70 cm Ao Asc diam:  3.60 cm MITRAL VALVE                TRICUSPID VALVE MV Area (PHT): 3.33 cm     TR Peak grad:   25.2 mmHg MV Area VTI:   1.96 cm     TR Vmax:        251.00 cm/s MV Peak grad:  8.2 mmHg MV Mean grad:  4.0 mmHg     SHUNTS MV Vmax:       1.43 m/s     Systemic VTI:  0.19 m MV Vmean:  90.7 cm/s    Systemic Diam: 1.90 cm MV Decel Time: 228 msec MV E velocity: 78.60 cm/s MV A velocity: 129.00 cm/s MV E/A ratio:  0.61 Lyman Bishop MD Electronically signed by Lyman Bishop MD Signature Date/Time: 12/24/2021/12:14:00 PM    Final    VAS US CAROTID  Result Date: 12/24/2021 Carotid Arterial Duplex Study Patient Name:  KATHARIN SCHNEIDER  Date of Exam:   12/24/2021 Medical Rec #: [id number]          Accession #:    [id number] Date of Birth: 08/09/1931          Patient Gender: F Patient Age:   51 years Exam Location:  [facility] Procedure:      VAS US CAROTID Referring Phys: Lesleigh Noe --------------------------------------------------------------------------------  Indications:       CVA. Risk Factors:      Hypertension, hyperlipidemia, prior CVA, PAD. Comparison Study:  no prior Performing Technologist: Archie Patten RVS  Examination Guidelines: A complete evaluation includes B-mode imaging, spectral Doppler, color Doppler, and power Doppler as needed of all accessible portions of each vessel. Bilateral testing is considered an integral part of a complete examination. Limited examinations for reoccurring indications may be performed as noted.  Right Carotid Findings: +----------+--------+--------+--------+------------------+--------+           PSV cm/sEDV cm/sStenosisPlaque DescriptionComments +----------+--------+--------+--------+------------------+--------+ CCA Prox  67      11              heterogenous               +----------+--------+--------+--------+------------------+--------+ CCA Distal50      13               heterogenous               +----------+--------+--------+--------+------------------+--------+ ICA Prox  99      18      1-39%   heterogenous               +----------+--------+--------+--------+------------------+--------+ ICA Distal80      22                                         +----------+--------+--------+--------+------------------+--------+ ECA       117                                                +----------+--------+--------+--------+------------------+--------+ +----------+--------+-------+--------+-------------------+           PSV cm/sEDV cmsDescribeArm Pressure (mmHG) +----------+--------+-------+--------+-------------------+ [id number]                                         +----------+--------+-------+--------+-------------------+ +---------+--------+--+--------+--+---------+ VertebralPSV cm/s64EDV cm/s12Antegrade +---------+--------+--+--------+--+---------+  Left Carotid Findings: +----------+--------+--------+--------+------------------+--------+           PSV cm/sEDV cm/sStenosisPlaque DescriptionComments +----------+--------+--------+--------+------------------+--------+ CCA Prox  83      13              heterogenous               +----------+--------+--------+--------+------------------+--------+ CCA Distal65      15              heterogenous               +----------+--------+--------+--------+------------------+--------+  ICA Prox  65      23      1-39%   heterogenous               +----------+--------+--------+--------+------------------+--------+ ICA Distal51      14                                         +----------+--------+--------+--------+------------------+--------+ ECA       139                                                +----------+--------+--------+--------+------------------+--------+ +----------+--------+--------+--------+-------------------+           PSV cm/sEDV  cm/sDescribeArm Pressure (mmHG) +----------+--------+--------+--------+-------------------+ [id number]                                         +----------+--------+--------+--------+-------------------+ +---------+--------+--+--------+--+---------+ VertebralPSV cm/s42EDV cm/s14Antegrade +---------+--------+--+--------+--+---------+   Summary: Right Carotid: Velocities in the right ICA are consistent with a 1-39% stenosis. Left Carotid: Velocities in the left ICA are consistent with a 1-39% stenosis. Vertebrals: Bilateral vertebral arteries demonstrate antegrade flow. *See table(s) above for measurements and observations.     Preliminary    MR ANGIO HEAD WO CONTRAST  Result Date: 12/24/2021 CLINICAL DATA:  Left hand weakness and numbness; small acute infarct on MRI EXAM: MRA HEAD WITHOUT CONTRAST TECHNIQUE: Angiographic images of the Circle of Willis were acquired using MRA technique without intravenous contrast. COMPARISON:  MRA head 11/13/2009, correlation is made with CTA head 02/22/2018 FINDINGS: Redemonstrated focus of restricted diffusion with ADC correlate in the right precentral gyrus (series 5, image 88), compatible with acute infarct. Anterior circulation: Both internal carotid arteries are patent to the termini, without significant stenosis. A1 segments patent. Normal anterior communicating artery. Anterior cerebral arteries are patent to their distal aspects. No M1 stenosis or occlusion. Distal MCA branches perfused and symmetric. Posterior circulation: Vertebral arteries patent to the vertebrobasilar junction without stenosis. Posterior inferior cerebral arteries patent bilaterally. Basilar patent to its distal aspect. Superior cerebellar arteries patent bilaterally. Patent P1 segments. PCAs perfused to their distal aspects without stenosis. The bilateral posterior communicating arteries are not visualized. Anatomic variants: None significant IMPRESSION: No intracranial large  vessel occlusion or significant stenosis. Electronically Signed   By: Merilyn Baba M.D.   On: 12/24/2021 03:23   MR CERVICAL SPINE W WO CONTRAST  Result Date: 12/23/2021 CLINICAL DATA:  Left finger weakness EXAM: MRI CERVICAL SPINE WITHOUT AND WITH CONTRAST TECHNIQUE: Multiplanar and multiecho pulse sequences of the cervical spine, to include the craniocervical junction and cervicothoracic junction, were obtained without and with intravenous contrast. CONTRAST:  22mL GADAVIST GADOBUTROL 1 MMOL/ML IV SOLN COMPARISON:  None Available. FINDINGS: Alignment: There is trace anterolisthesis of C3 on C4 and trace retrolisthesis of C4 on C5. Vertebrae: No fracture, evidence of discitis, or bone lesion. Cord: Normal signal and morphology. Posterior Fossa, vertebral arteries, paraspinal tissues: Negative. Disc levels: C1-C2: Mild degenerative change. C2-C3: Moderate bilateral facet degenerative change. Minimal disc bulge. No spinal canal stenosis. No significant neural foraminal stenosis. C3-C4: Small central disc protrusion that contacts the ventral spinal cord. Severe bilateral facet degenerative change. Ligamentum flavum hypertrophy. Mild spinal canal stenosis. Uncovertebral hypertrophy. Mild  bilateral neural foraminal stenosis. C4-C5: Moderate disc space loss. Circumferential disc bulge. Ligamentum flavum hypertrophy. Moderate bilateral facet degenerative change. Uncovertebral hypertrophy. Moderate to severe spinal canal stenosis. Severe bilateral neural foraminal stenosis. C5-C6: Ligamentum flavum hypertrophy. Circumferential disc bulge. Uncovertebral hypertrophy. Moderate bilateral facet degenerative change. Severe spinal canal and bilateral neural foraminal stenosis. C6-C7: Moderate disc space loss. Ligamentum flavum hypertrophy. Mild bilateral facet degenerative change. Uncovertebral hypertrophy. Severe bilateral neural foraminal stenosis. Severe spinal canal stenosis. C7-T1: Severe bilateral facet degenerative  change. Ligamentum flavum hypertrophy. No significant spinal canal or neural foraminal stenosis. IMPRESSION: 1. Midcervical spine predominant degenerative change with severe spinal canal stenosis at C4-C5, C5-C6, and C6-C7, as described above. 2. Severe bilateral neural foraminal stenosis is also noted at C4-C5, C5-C6, and C6-C7. 3. No evidence of cord signal abnormality or abnormal contrast enhancement. Electronically Signed   By: Marin Roberts M.D.   On: 12/23/2021 16:44   MR BRAIN WO CONTRAST  Result Date: 12/23/2021 CLINICAL DATA:  Left finger weakness EXAM: MRI HEAD WITHOUT CONTRAST TECHNIQUE: Multiplanar, multiecho pulse sequences of the brain and surrounding structures were obtained without intravenous contrast. COMPARISON:  None Available. FINDINGS: Brain: Small acute infarct in the precentral gyrus on the right (series 5, image 88).There is sequela of severe chronic microvascular ischemic change. No extra-axial fluid collection. There is a small focus of microhemorrhage in the right cerebellar hemisphere. Vascular: Normal flow voids. Skull and upper cervical spine: No worrisome lesions in the skull. See separately dictated report for cervical spine findings. Sinuses/Orbits: Bilateral lens replacement. Likely a mucous retention cyst in the left maxillary sinus. Mild mucosal thickening bilateral ethmoid sinuses. Other: None IMPRESSION: Small focus of acute infarct in the precentral gyrus on the right. Electronically Signed   By: Marin Roberts M.D.   On: 12/23/2021 16:36   CT HEAD WO CONTRAST  Result Date: 12/23/2021 CLINICAL DATA:  Acute neuro deficit.  Rule out stroke.  Weakness EXAM: CT HEAD WITHOUT CONTRAST TECHNIQUE: Contiguous axial images were obtained from the base of the skull through the vertex without intravenous contrast. RADIATION DOSE REDUCTION: This exam was performed according to the departmental dose-optimization program which includes automated exposure control, adjustment of the mA  and/or kV according to patient size and/or use of iterative reconstruction technique. COMPARISON:  CT head 11/12/2009.  MRI head 02/22/2018 FINDINGS: Brain: Mild atrophy. Negative for hydrocephalus. Moderate to advanced white matter changes with diffuse white matter hypodensity. Negative for acute infarct, hemorrhage, mass Vascular: Negative for hyperdense vessel Skull: Negative Sinuses/Orbits: Mild mucosal edema paranasal sinuses. 5 mm osteoma left ethmoid sinus. Bilateral cataract extraction Other: None IMPRESSION: Atrophy and moderate to advanced chronic microvascular ischemia. No acute abnormality. Electronically Signed   By: Franchot Gallo M.D.   On: 12/23/2021 11:33   DG Chest 2 View  Result Date: 12/23/2021 CLINICAL DATA:  Short of breath EXAM: CHEST - 2 VIEW COMPARISON:  Chest 10/01/2020 FINDINGS: Cardiac enlargement without heart failure. Mild airspace disease in lung bases best seen on the lateral view. Probable atelectasis. No effusion. Lungs show mild hyperinflation. Atherosclerotic calcification aortic arch. IMPRESSION: 1. Cardiac enlargement without heart failure. 2. Mild bibasilar atelectasis. Electronically Signed   By: Franchot Gallo M.D.   On: 12/23/2021 11:27   DG Abd 2 Views  Result Date: 12/19/2021 CLINICAL DATA:  Abdominal bloating.  History of colon cancer. EXAM: ABDOMEN - 2 VIEW COMPARISON:  None Available. FINDINGS: The bowel gas pattern is normal. There is no evidence of free air. Phleboliths are noted in the  pelvis. IMPRESSION: No abnormal bowel dilatation. Aortic Atherosclerosis (ICD10-I70.0). Electronically Signed   By: Marijo Conception M.D.   On: 12/19/2021 07:34    12-lead ECG on arrival shows NSR at 91 bpm (personally reviewed) All prior EKG's in EPIC reviewed with no documented atrial fibrillation  Telemetry NSR 80-90s (personally reviewed)  Assessment and Plan:  1. Cryptogenic stroke The patient presents with cryptogenic stroke.  The patient does not have a TEE  planned for this AM.  I spoke at length with the patient about monitoring for afib with an implantable loop recorder.  Risks, benefits, and alteratives to implantable loop recorder were discussed with the patient today.   At this time, the patient is very clear in their decision to proceed with implantable loop recorder.    Wound care was reviewed with the patient (keep incision clean and dry for 3 days). Please call with questions.    Shirley Friar, PA-C 12/24/2021 2:08 PM

## 2021-12-24 NOTE — Evaluation (Signed)
Physical Therapy Evaluation Patient Details Name: Gina [name] MRN: [id number] DOB: [birth year] Today's Date: 12/24/2021  History of Present Illness  Patient is a 88 y/o female who presents on 12/23/21 with difficulty using left hand. Brain MRI- acute infarct right precentral gyrus. PMH includes CVA, PAD, HTN, macular degeneration.  Clinical Impression  Patient presents with left handed weakness and deconditioning s/p above. Pt lives home alone and reports using SPC vs RW for ambulation. Pt is independent for ADLs and IADLs but does not drive. Has support from friends/family. Pt reports recent fall. Today, pt tolerated transfers and gait training with supervision-Min A for safety. Tolerated stair training with Min guard assist as well. Pt is a limited ambulator at baseline. Seems to be functioning close to baseline with some mild weakness/deconditioning from hospitalization. Reports improved left hand function. Will consult mobility tech to improve overall strength/mobility. Education re: BeFAST. Will follow acutely to maximize independence and mobility prior to return home.       Recommendations for follow up therapy are one component of a multi-disciplinary discharge planning process, led by the attending physician.  Recommendations may be updated based on patient status, additional functional criteria and insurance authorization.  Follow Up Recommendations No PT follow up      Assistance Recommended at Discharge PRN  Patient can return home with the following  Assist for transportation;Assistance with cooking/housework    Equipment Recommendations None recommended by PT  Recommendations for Other Services       Functional Status Assessment Patient has had a recent decline in their functional status and demonstrates the ability to make significant improvements in function in a reasonable and predictable amount of time.     Precautions / Restrictions Precautions Precautions:  Fall Restrictions Weight Bearing Restrictions: No      Mobility  Bed Mobility Overal bed mobility: Needs Assistance Bed Mobility: Supine to Sit     Supine to sit: Supervision, HOB elevated     General bed mobility comments: Use of rail, increased time. no assist needed.    Transfers Overall transfer level: Needs assistance Equipment used: Rolling walker (2 wheels) Transfers: Sit to/from Stand Sit to Stand: Min guard, Min assist           General transfer comment: Min guard initially, Min A needed from shower chair without arm rests, cues for hand placement/technique as pt pulling up on RW. Transferred to chair post ambulation.    Ambulation/Gait Ambulation/Gait assistance: Supervision Gait Distance (Feet): 100 Feet (x2 bouts) Assistive device: Rolling walker (2 wheels) Gait Pattern/deviations: Step-through pattern, Decreased stride length, Trunk flexed Gait velocity: decreased Gait velocity interpretation: <1.31 ft/sec, indicative of household ambulator   General Gait Details: SLow, mostly steady gait with 1 seated rest break and 1 standing rest break. VSS on RA.  Stairs Stairs: Yes Stairs assistance: Min guard Stair Management: Step to pattern, One rail Left Number of Stairs: 2 General stair comments: Cues for techinqe and safety.  Wheelchair Mobility    Modified Rankin (Stroke Patients Only) Modified Rankin (Stroke Patients Only) Pre-Morbid Rankin Score: Slight disability Modified Rankin: Moderate disability     Balance Overall balance assessment: Needs assistance Sitting-balance support: Feet supported, No upper extremity supported Sitting balance-Leahy Scale: Good Sitting balance - Comments: Able to reach outside BoS and doff/donn socks/shoes without LOB   Standing balance support: During functional activity, Reliant on assistive device for balance Standing balance-Leahy Scale: Poor Standing balance comment: Requires UE support for walking  Pertinent Vitals/Pain Pain Assessment Pain Assessment: No/denies pain    Home Living Family/patient expects to be discharged to:: Private residence Living Arrangements: Alone Available Help at Discharge: Family;Friend(s);Available PRN/intermittently;Neighbor Type of Home: House Home Access: Stairs to enter Entrance Stairs-Rails: Left Entrance Stairs-Number of Steps: 2   Home Layout: Two level;Able to live on main level with bedroom/bathroom Home Equipment: Rolling Walker (2 wheels);Cane - single point;Shower seat - built in Additional Comments: (P) 2 RW    Prior Function Prior Level of Function : Independent/Modified Independent             Mobility Comments: Uses SPC for daytime ambulation and RW for night time ambulation,. reports recent fall outside. Does not drive. Has friends/family assist with driving. ADLs Comments: independent     Hand Dominance   Dominant Hand: Right    Extremity/Trunk Assessment   Upper Extremity Assessment Upper Extremity Assessment: Defer to OT evaluation;LUE deficits/detail LUE Deficits / Details: Reports some difficulty with grasping with left hand, mild weakness noted otherwise functional during session. LUE Sensation: WNL LUE Coordination: WNL    Lower Extremity Assessment Lower Extremity Assessment: Generalized weakness (but functional, grossly ~4/5 throughout)       Communication   Communication: No difficulties  Cognition Arousal/Alertness: Awake/alert Behavior During Therapy: Flat affect, WFL for tasks assessed/performed Overall Cognitive Status: Within Functional Limits for tasks assessed                                          General Comments      Exercises     Assessment/Plan    PT Assessment Patient needs continued PT services  PT Problem List Decreased strength;Decreased mobility       PT Treatment Interventions Therapeutic activities;Gait  training;Therapeutic exercise;Patient/family education;Balance training;Stair training;Functional mobility training;Neuromuscular re-education    PT Goals (Current goals can be found in the Care Plan section)  Acute Rehab PT Goals Patient Stated Goal: to go home PT Goal Formulation: With patient Time For Goal Achievement: 01/07/22 Potential to Achieve Goals: Good    Frequency Min 4X/week     Co-evaluation               AM-PAC PT "6 Clicks" Mobility  Outcome Measure Help needed turning from your back to your side while in a flat bed without using bedrails?: A Little Help needed moving from lying on your back to sitting on the side of a flat bed without using bedrails?: A Little Help needed moving to and from a bed to a chair (including a wheelchair)?: A Little Help needed standing up from a chair using your arms (e.g., wheelchair or bedside chair)?: A Little Help needed to walk in hospital room?: A Little Help needed climbing 3-5 steps with a railing? : A Little 6 Click Score: 18    End of Session Equipment Utilized During Treatment: Gait belt Activity Tolerance: Patient tolerated treatment well Patient left: in chair;with call bell/phone within reach;with chair alarm set Nurse Communication: Mobility status PT Visit Diagnosis: Muscle weakness (generalized) (M62.81);Unsteadiness on feet (R26.81)    Time: 7616-0737 PT Time Calculation (min) (ACUTE ONLY): 35 min   Charges:   PT Evaluation $PT Eval Moderate Complexity: 1 Mod PT Treatments $Gait Training: 8-22 mins        Marisa Severin, PT, DPT Acute Rehabilitation Services Secure chat preferred Office [place] 12/24/2021, 11:10  AM

## 2021-12-24 NOTE — TOC CAGE-AID Note (Signed)
Transition of Care [facility]) - CAGE-AID Screening   Patient Details  Name: Gina [name] MRN: [id number] Date of Birth: 07/08/1931  [facility] CM/SW Contact:    Pollie Friar, RN Phone Number: 12/24/2021, 2:10 PM   Clinical Narrative: Pt denies need for substance use resources   CAGE-AID Screening:    Have You Ever Felt You Ought to Cut Down on Your Drinking or Drug Use?: No Have People Annoyed You By Critizing Your Drinking Or Drug Use?: No Have You Felt Bad Or Guilty About Your Drinking Or Drug Use?: No Have You Ever Had a Drink or Used Drugs First Thing In The Morning to Steady Your Nerves or to Get Rid of a Hangover?: No CAGE-AID Score: 0  Substance Abuse Education Offered: Yes (not needed)

## 2021-12-24 NOTE — Hospital Course (Addendum)
88 year old woman presenting with left hand weakness, numbness, tingling, inability to hold objects admitted for the same and further evaluation.

## 2021-12-24 NOTE — Progress Notes (Signed)
Carotid duplex has been completed.   Preliminary results in CV Proc.   Gina [name] 12/24/2021 11:48 AM

## 2021-12-24 NOTE — Progress Notes (Signed)
Brief Nutrition Note  RD received consult for assessment of nutritional status.   Pt with hx of HTN, HLD, gout, hx stroke, CHF, COPD on nocturnal O2, hx colon cancer, and PAD presented to ED with left sided clumsiness.   Met with patient at bedside this afternoon. She reports a recent UBW of 172# but notes weight loss over the past few months. Atrributes loss to eating less as a result of decreased appetite. However, reports she feels weight loss is both fluid loss and body weight loss but that some weight loss was intentional. Still eating 3 meals a day, just in smaller portions. Typically has a small breakfast, a lunch of a sandwich, cookie, and green tea, and dinner of fish or chicken, potatoes or a roll, and a salad or vegetable. Admits her current appetite is decreased but that is it mostly due to getting very little sleep over the past day. Patient endorses the plan is for her to discharge today.   Labs and medications reviewed. No further nutrition interventions warranted at this time. If additional nutrition issues arise, please re-consult RD.   Samson Frederic RD, LDN For contact information, refer to [facility].

## 2021-12-24 NOTE — Discharge Summary (Signed)
Physician Discharge Summary   Patient: Gina [name] MRN: [id number] DOB: [birth year]  Admit date:     12/23/2021  Discharge date: 12/24/21  Discharge Physician: Murray Hodgkins   PCP: Cassandria Anger, MD   Recommendations at discharge:  Acute CVA --Changed Plavix to Brilinta given allergy to aspirin.  Continue indefinitely if patient is able, otherwise after 30 days resume Plavix. --followup with neurology in 2 monhts   Midcervical spine predominant degenerative change with  severe spinal canal stenosis at C4-C5, C5-C6, and C6-C7, as described above. -- Well-known to patient.  No acute intervention warranted.  Suspect CKD stage II --Follow-up as an outpatient  Polycythemia, stable since August 2022 --Follow-up as an outpatient  Discharge Diagnoses: Principal Problem:   Cryptogenic stroke ([place]) Active Problems:   Dyslipidemia   Gout   Essential hypertension   Hypoxemia   Polycythemia   Acute focal neurological deficit   DNR (do not resuscitate)   Chronic respiratory failure with hypoxia ([place])  Resolved Problems:   * No resolved hospital problems. *  Hospital Course: 88 year old woman presenting with left hand weakness, numbness, tingling, inability to hold objects admitted for the same and further evaluation.  Acute CVA, small right frontal infarct likely of embolic etiology from cryptogenic source with left hand weakness on admission --MRI showed small focus of acute infarct in the precentral gyrus on the right.  --Symptoms now resolved.  No therapy needs as an outpatient.  Work-up unrevealing.  In discussion with neurology, loop recorder was recommended to rule out occult atrial fibrillation.  It was placed same day by electrophysiology. --Change Plavix to Brilinta given allergy to aspirin.  Continue indefinitely if patient is able, otherwise after 30 days resume Plavix.   Essential hypertension -Can resume antihypertensives after discharge    Hyperlipidemia -Continue Lipitor 40 mg daily   Gout -Continue allopurinol    Chronic hypoxic respiratory failure, nocturnal, COPD -Continue Trelegy -Continue supplemental oxygen as needed.   Neuropathy -Continue gabapentin   Midcervical spine predominant degenerative change with  severe spinal canal stenosis at C4-C5, C5-C6, and C6-C7, as described above. -- Well-known to patient.  No acute intervention warranted.  Suspect CKD stage II --Follow-up as an outpatient  Polycythemia, stable since August 2022 --Follow-up as an outpatient      Consultants:  Neurology Electrophysiology   Procedures performed:  Loop recorder insertion   Disposition: Home Diet recommendation:  Cardiac diet DISCHARGE MEDICATION: Allergies as of 12/24/2021       Reactions   Penicillins Hives   Has patient had a PCN reaction causing immediate rash, facial/tongue/throat swelling, SOB or lightheadedness with hypotension:## yes ## Has patient had a PCN reaction causing severe rash involving mucus membranes or skin necrosis: no Has patient had a PCN reaction that required hospitalization: no Has patient had a PCN reaction occurring within the last 10 years: no If all of the above answers are "NO", then may proceed with Cephalosporin use.   Aspirin Hives   Enalapril Other (See Comments), Cough   Wheezing, also   Flonase [fluticasone] Other (See Comments)   Nose bleeds   Adhesive [tape] Other (See Comments)   "pulls skin off"        Medication List     STOP taking these medications    clopidogrel 75 MG tablet Commonly known as: PLAVIX       TAKE these medications    acetaminophen 500 MG tablet Commonly known as: TYLENOL Take 500-1,000 mg by mouth See admin instructions. Take 1,000  mg by mouth in the morning, 500 mg at 4 PM, and 500 mg at bedtime   allopurinol 100 MG tablet Commonly known as: ZYLOPRIM TAKE 1 TABLET DAILY What changed: when to take this   amLODipine 5 MG  tablet Commonly known as: NORVASC TAKE 1 TABLET DAILY   ascorbic acid 500 MG tablet Commonly known as: VITAMIN C Take 500 mg by mouth every evening.   atorvastatin 40 MG tablet Commonly known as: LIPITOR Take 1 tablet (40 mg total) by mouth every evening.   CALCIUM 600 PO Take 600 mg by mouth in the morning and at bedtime.   cetirizine 10 MG tablet Commonly known as: ZYRTEC Take 1 tablet (10 mg total) by mouth 2 (two) times daily. What changed: when to take this   gabapentin 400 MG capsule Commonly known as: NEURONTIN TAKE 1 CAPSULE 4 TIMES     DAILY What changed: See the new instructions.   losartan 50 MG tablet Commonly known as: COZAAR TAKE 1 TABLET DAILY What changed: when to take this   metroNIDAZOLE 500 MG tablet Commonly known as: Flagyl Take 1 tablet (500 mg total) by mouth 3 (three) times daily.   multivitamin with minerals Tabs tablet Take 1 tablet by mouth daily with breakfast.   mupirocin ointment 2 % Commonly known as: BACTROBAN Apply 1 Application topically daily as needed (for irritation- affected areas).   OXYGEN Inhale 2 L/min into the lungs at bedtime.   PRESERVISION AREDS 2 PO Take 1 capsule by mouth in the morning.   sodium chloride 0.65 % Soln nasal spray Commonly known as: OCEAN Place 1 spray into both nostrils as needed for congestion.   ticagrelor 90 MG Tabs tablet Commonly known as: BRILINTA Take 1 tablet (90 mg total) by mouth 2 (two) times daily. Start taking on: December 25, 2021   torsemide 20 MG tablet Commonly known as: DEMADEX Take 1-2 tablets (20-40 mg total) by mouth daily. What changed: when to take this   Trelegy Ellipta 100-62.5-25 MCG/ACT Aepb Generic drug: Fluticasone-Umeclidin-Vilant USE 1 INHALATION ORALLY    DAILY What changed: See the new instructions.   triamterene-hydrochlorothiazide 37.5-25 MG tablet Commonly known as: MAXZIDE-25 TAKE 1 TABLET DAILY What changed: when to take this       Feels  better Left hand appears to be back to normal No other focal neuro complaints   Discharge Exam: Filed Weights   12/23/21 1055  Weight: 78 kg  Vitals reviewed.  Constitutional:      General: She is not in acute distress.    Appearance: She is not ill-appearing or toxic-appearing.  Cardiovascular:     Rate and Rhythm: Normal rate and regular rhythm.     Heart sounds: No murmur heard.    Comments: Telemetry SR Pulmonary:     Effort: Pulmonary effort is normal. No respiratory distress.     Breath sounds: No wheezing, rhonchi or rales.  Neurological:     General: No focal deficit present.     Mental Status: She is alert.     Comments: Moves all extremities well No dysdiadokinesis BUE Intact finger to thumb bilaterally No pronator drift Strength grossly symmetric BUE  Psychiatric:        Mood and Affect: Mood normal.        Behavior: Behavior normal.    Condition at discharge: good  The results of significant diagnostics from this hospitalization (including imaging, microbiology, ancillary and laboratory) are listed below for reference.   Imaging Studies: ECHOCARDIOGRAM  COMPLETE  Result Date: 12/24/2021    ECHOCARDIOGRAM REPORT   Patient Name:   TISHIE ALTMANN Date of Exam: 12/24/2021 Medical Rec #:  [id number]         Height:       60.0 in Accession #:    [id number]        Weight:       172.0 lb Date of Birth:  1931-11-07         BSA:          1.751 m Patient Age:    88 years          BP:           162/69 mmHg Patient Gender: F                 HR:           90 bpm. Exam Location:  Inpatient Procedure: 2D Echo, Color Doppler, Cardiac Doppler and Intracardiac            Opacification Agent Indications:    Stroke  History:        Patient has prior history of Echocardiogram examinations, most                 recent 02/23/2018. CHF, TIA, PAD and COPD; Risk                 Factors:Hypertension and Dyslipidemia. Cancer.  Sonographer:    Eartha Inch Referring Phys: [place]   Sonographer Comments: Technically difficult study due to poor echo windows. Image acquisition challenging due to patient body habitus, Image acquisition challenging due to respiratory motion and Image acquisition challenging due to COPD. IMPRESSIONS  1. Left ventricular ejection fraction, by estimation, is 60 to 65%. Left ventricular ejection fraction by PLAX is 62 %. The left ventricle has normal function. The left ventricle has no regional wall motion abnormalities. Left ventricular diastolic parameters are consistent with Grade I diastolic dysfunction (impaired relaxation). Elevated left ventricular end-diastolic pressure. The E/e' is 52.  2. Right ventricular systolic function is mildly reduced. The right ventricular size is normal. There is normal pulmonary artery systolic pressure. The estimated right ventricular systolic pressure is 37.8 mmHg.  3. The mitral valve is abnormal. Trivial mitral valve regurgitation.  4. The aortic valve is tricuspid. Aortic valve regurgitation is not visualized. Aortic valve sclerosis/calcification is present, without any evidence of aortic stenosis.  5. The inferior vena cava is normal in size with greater than 50% respiratory variability, suggesting right atrial pressure of 3 mmHg. Comparison(s): Changes from prior study are noted. 02/23/2018: LVEF 55-60%, grade 1 DD, trivial pericardial effusion. FINDINGS  Left Ventricle: Left ventricular ejection fraction, by estimation, is 60 to 65%. Left ventricular ejection fraction by PLAX is 62 %. The left ventricle has normal function. The left ventricle has no regional wall motion abnormalities. Definity contrast agent was given IV to delineate the left ventricular endocardial borders. The left ventricular internal cavity size was normal in size. There is no left ventricular hypertrophy. Left ventricular diastolic parameters are consistent with Grade I diastolic dysfunction (impaired relaxation). Elevated left ventricular end-diastolic  pressure. The E/e' is 72. Right Ventricle: The right ventricular size is normal. No increase in right ventricular wall thickness. Right ventricular systolic function is mildly reduced. There is normal pulmonary artery systolic pressure. The tricuspid regurgitant velocity is 2.51 m/s, and with an assumed right atrial pressure of 3 mmHg, the estimated right ventricular systolic pressure is 58.8 mmHg.  Left Atrium: Left atrial size was normal in size. Right Atrium: Right atrial size was normal in size. Pericardium: Trivial pericardial effusion is present. The pericardial effusion is posterior to the left ventricle. Mitral Valve: The mitral valve is abnormal. Mild to moderate mitral annular calcification. Trivial mitral valve regurgitation. MV peak gradient, 8.2 mmHg. The mean mitral valve gradient is 4.0 mmHg. Tricuspid Valve: The tricuspid valve is grossly normal. Tricuspid valve regurgitation is trivial. Aortic Valve: The aortic valve is tricuspid. Aortic valve regurgitation is not visualized. Aortic valve sclerosis/calcification is present, without any evidence of aortic stenosis. Pulmonic Valve: The pulmonic valve was grossly normal. Pulmonic valve regurgitation is trivial. Aorta: The aortic root and ascending aorta are structurally normal, with no evidence of dilitation. Venous: The inferior vena cava is normal in size with greater than 50% respiratory variability, suggesting right atrial pressure of 3 mmHg. IAS/Shunts: No atrial level shunt detected by color flow Doppler.  LEFT VENTRICLE PLAX 2D LV EF:         Left            Diastology                ventricular     LV e' medial:    4.38 cm/s                ejection        LV E/e' medial:  17.9                fraction by     LV e' lateral:   4.03 cm/s                PLAX is 62      LV E/e' lateral: 19.5                %. LVIDd:         4.40 cm LVIDs:         2.95 cm LV PW:         0.70 cm LV IVS:        0.70 cm LVOT diam:     1.90 cm LV SV:         53 LV SV  Index:   30 LVOT Area:     2.84 cm  LV Volumes (MOD) LV vol d, MOD    49.4 ml A2C: LV vol d, MOD    88.0 ml A4C: LV vol s, MOD    22.9 ml A2C: LV vol s, MOD    35.3 ml A4C: LV SV MOD A2C:   26.5 ml LV SV MOD A4C:   88.0 ml LV SV MOD BP:    37.9 ml RIGHT VENTRICLE            IVC RV S prime:     9.90 cm/s  IVC diam: 0.70 cm TAPSE (M-mode): 1.2 cm LEFT ATRIUM             Index        RIGHT ATRIUM           Index LA diam:        4.10 cm 2.34 cm/m   RA Area:     17.70 cm LA Vol (A2C):   65.6 ml 37.47 ml/m  RA Volume:   49.00 ml  27.99 ml/m LA Vol (A4C):   45.6 ml 26.05 ml/m LA Biplane Vol: 55.1 ml 31.47 ml/m  AORTIC VALVE  PULMONIC VALVE LVOT Vmax:   96.10 cm/s  PR End Diast Vel: 7.29 msec LVOT Vmean:  62.500 cm/s LVOT VTI:    0.188 m  AORTA Ao Root diam: 2.70 cm Ao Asc diam:  3.60 cm MITRAL VALVE                TRICUSPID VALVE MV Area (PHT): 3.33 cm     TR Peak grad:   25.2 mmHg MV Area VTI:   1.96 cm     TR Vmax:        251.00 cm/s MV Peak grad:  8.2 mmHg MV Mean grad:  4.0 mmHg     SHUNTS MV Vmax:       1.43 m/s     Systemic VTI:  0.19 m MV Vmean:      90.7 cm/s    Systemic Diam: 1.90 cm MV Decel Time: 228 msec MV E velocity: 78.60 cm/s MV A velocity: 129.00 cm/s MV E/A ratio:  0.61 Lyman Bishop MD Electronically signed by Lyman Bishop MD Signature Date/Time: 12/24/2021/12:14:00 PM    Final    VAS US CAROTID  Result Date: 12/24/2021 Carotid Arterial Duplex Study Patient Name:  BURKLEY DECH  Date of Exam:   12/24/2021 Medical Rec #: [id number]          Accession #:    [id number] Date of Birth: [birth year]          Patient Gender: F Patient Age:   63 years Exam Location:  [facility] Procedure:      VAS US CAROTID Referring Phys: Lesleigh Noe --------------------------------------------------------------------------------  Indications:       CVA. Risk Factors:      Hypertension, hyperlipidemia, prior CVA, PAD. Comparison Study:  no prior Performing Technologist: Archie Patten RVS   Examination Guidelines: A complete evaluation includes B-mode imaging, spectral Doppler, color Doppler, and power Doppler as needed of all accessible portions of each vessel. Bilateral testing is considered an integral part of a complete examination. Limited examinations for reoccurring indications may be performed as noted.  Right Carotid Findings: +----------+--------+--------+--------+------------------+--------+           PSV cm/sEDV cm/sStenosisPlaque DescriptionComments +----------+--------+--------+--------+------------------+--------+ CCA Prox  67      11              heterogenous               +----------+--------+--------+--------+------------------+--------+ CCA Distal50      13              heterogenous               +----------+--------+--------+--------+------------------+--------+ ICA Prox  99      18      1-39%   heterogenous               +----------+--------+--------+--------+------------------+--------+ ICA Distal80      22                                         +----------+--------+--------+--------+------------------+--------+ ECA       117                                                +----------+--------+--------+--------+------------------+--------+ +----------+--------+-------+--------+-------------------+           PSV cm/sEDV  cmsDescribeArm Pressure (mmHG) +----------+--------+-------+--------+-------------------+ [id number]                                         +----------+--------+-------+--------+-------------------+ +---------+--------+--+--------+--+---------+ VertebralPSV cm/s64EDV cm/s12Antegrade +---------+--------+--+--------+--+---------+  Left Carotid Findings: +----------+--------+--------+--------+------------------+--------+           PSV cm/sEDV cm/sStenosisPlaque DescriptionComments +----------+--------+--------+--------+------------------+--------+ CCA Prox  83      13              heterogenous                +----------+--------+--------+--------+------------------+--------+ CCA Distal65      15              heterogenous               +----------+--------+--------+--------+------------------+--------+ ICA Prox  65      23      1-39%   heterogenous               +----------+--------+--------+--------+------------------+--------+ ICA Distal51      14                                         +----------+--------+--------+--------+------------------+--------+ ECA       139                                                +----------+--------+--------+--------+------------------+--------+ +----------+--------+--------+--------+-------------------+           PSV cm/sEDV cm/sDescribeArm Pressure (mmHG) +----------+--------+--------+--------+-------------------+ [id number]                                         +----------+--------+--------+--------+-------------------+ +---------+--------+--+--------+--+---------+ VertebralPSV cm/s42EDV cm/s14Antegrade +---------+--------+--+--------+--+---------+   Summary: Right Carotid: Velocities in the right ICA are consistent with a 1-39% stenosis. Left Carotid: Velocities in the left ICA are consistent with a 1-39% stenosis. Vertebrals: Bilateral vertebral arteries demonstrate antegrade flow. *See table(s) above for measurements and observations.     Preliminary    MR ANGIO HEAD WO CONTRAST  Result Date: 12/24/2021 CLINICAL DATA:  Left hand weakness and numbness; small acute infarct on MRI EXAM: MRA HEAD WITHOUT CONTRAST TECHNIQUE: Angiographic images of the Circle of Willis were acquired using MRA technique without intravenous contrast. COMPARISON:  MRA head 11/13/2009, correlation is made with CTA head 02/22/2018 FINDINGS: Redemonstrated focus of restricted diffusion with ADC correlate in the right precentral gyrus (series 5, image 88), compatible with acute infarct. Anterior circulation: Both internal carotid  arteries are patent to the termini, without significant stenosis. A1 segments patent. Normal anterior communicating artery. Anterior cerebral arteries are patent to their distal aspects. No M1 stenosis or occlusion. Distal MCA branches perfused and symmetric. Posterior circulation: Vertebral arteries patent to the vertebrobasilar junction without stenosis. Posterior inferior cerebral arteries patent bilaterally. Basilar patent to its distal aspect. Superior cerebellar arteries patent bilaterally. Patent P1 segments. PCAs perfused to their distal aspects without stenosis. The bilateral posterior communicating arteries are not visualized. Anatomic variants: None significant IMPRESSION: No intracranial large vessel occlusion or significant stenosis. Electronically Signed   By: Merilyn Baba M.D.   On: 12/24/2021 03:23   MR CERVICAL SPINE  W WO CONTRAST  Result Date: 12/23/2021 CLINICAL DATA:  Left finger weakness EXAM: MRI CERVICAL SPINE WITHOUT AND WITH CONTRAST TECHNIQUE: Multiplanar and multiecho pulse sequences of the cervical spine, to include the craniocervical junction and cervicothoracic junction, were obtained without and with intravenous contrast. CONTRAST:  64mL GADAVIST GADOBUTROL 1 MMOL/ML IV SOLN COMPARISON:  None Available. FINDINGS: Alignment: There is trace anterolisthesis of C3 on C4 and trace retrolisthesis of C4 on C5. Vertebrae: No fracture, evidence of discitis, or bone lesion. Cord: Normal signal and morphology. Posterior Fossa, vertebral arteries, paraspinal tissues: Negative. Disc levels: C1-C2: Mild degenerative change. C2-C3: Moderate bilateral facet degenerative change. Minimal disc bulge. No spinal canal stenosis. No significant neural foraminal stenosis. C3-C4: Small central disc protrusion that contacts the ventral spinal cord. Severe bilateral facet degenerative change. Ligamentum flavum hypertrophy. Mild spinal canal stenosis. Uncovertebral hypertrophy. Mild bilateral neural foraminal  stenosis. C4-C5: Moderate disc space loss. Circumferential disc bulge. Ligamentum flavum hypertrophy. Moderate bilateral facet degenerative change. Uncovertebral hypertrophy. Moderate to severe spinal canal stenosis. Severe bilateral neural foraminal stenosis. C5-C6: Ligamentum flavum hypertrophy. Circumferential disc bulge. Uncovertebral hypertrophy. Moderate bilateral facet degenerative change. Severe spinal canal and bilateral neural foraminal stenosis. C6-C7: Moderate disc space loss. Ligamentum flavum hypertrophy. Mild bilateral facet degenerative change. Uncovertebral hypertrophy. Severe bilateral neural foraminal stenosis. Severe spinal canal stenosis. C7-T1: Severe bilateral facet degenerative change. Ligamentum flavum hypertrophy. No significant spinal canal or neural foraminal stenosis. IMPRESSION: 1. Midcervical spine predominant degenerative change with severe spinal canal stenosis at C4-C5, C5-C6, and C6-C7, as described above. 2. Severe bilateral neural foraminal stenosis is also noted at C4-C5, C5-C6, and C6-C7. 3. No evidence of cord signal abnormality or abnormal contrast enhancement. Electronically Signed   By: Marin Roberts M.D.   On: 12/23/2021 16:44   MR BRAIN WO CONTRAST  Result Date: 12/23/2021 CLINICAL DATA:  Left finger weakness EXAM: MRI HEAD WITHOUT CONTRAST TECHNIQUE: Multiplanar, multiecho pulse sequences of the brain and surrounding structures were obtained without intravenous contrast. COMPARISON:  None Available. FINDINGS: Brain: Small acute infarct in the precentral gyrus on the right (series 5, image 88).There is sequela of severe chronic microvascular ischemic change. No extra-axial fluid collection. There is a small focus of microhemorrhage in the right cerebellar hemisphere. Vascular: Normal flow voids. Skull and upper cervical spine: No worrisome lesions in the skull. See separately dictated report for cervical spine findings. Sinuses/Orbits: Bilateral lens replacement.  Likely a mucous retention cyst in the left maxillary sinus. Mild mucosal thickening bilateral ethmoid sinuses. Other: None IMPRESSION: Small focus of acute infarct in the precentral gyrus on the right. Electronically Signed   By: Marin Roberts M.D.   On: 12/23/2021 16:36   CT HEAD WO CONTRAST  Result Date: 12/23/2021 CLINICAL DATA:  Acute neuro deficit.  Rule out stroke.  Weakness EXAM: CT HEAD WITHOUT CONTRAST TECHNIQUE: Contiguous axial images were obtained from the base of the skull through the vertex without intravenous contrast. RADIATION DOSE REDUCTION: This exam was performed according to the departmental dose-optimization program which includes automated exposure control, adjustment of the mA and/or kV according to patient size and/or use of iterative reconstruction technique. COMPARISON:  CT head 11/12/2009.  MRI head 02/22/2018 FINDINGS: Brain: Mild atrophy. Negative for hydrocephalus. Moderate to advanced white matter changes with diffuse white matter hypodensity. Negative for acute infarct, hemorrhage, mass Vascular: Negative for hyperdense vessel Skull: Negative Sinuses/Orbits: Mild mucosal edema paranasal sinuses. 5 mm osteoma left ethmoid sinus. Bilateral cataract extraction Other: None IMPRESSION: Atrophy and moderate to advanced chronic  microvascular ischemia. No acute abnormality. Electronically Signed   By: Franchot Gallo M.D.   On: 12/23/2021 11:33   DG Chest 2 View  Result Date: 12/23/2021 CLINICAL DATA:  Short of breath EXAM: CHEST - 2 VIEW COMPARISON:  Chest 10/01/2020 FINDINGS: Cardiac enlargement without heart failure. Mild airspace disease in lung bases best seen on the lateral view. Probable atelectasis. No effusion. Lungs show mild hyperinflation. Atherosclerotic calcification aortic arch. IMPRESSION: 1. Cardiac enlargement without heart failure. 2. Mild bibasilar atelectasis. Electronically Signed   By: Franchot Gallo M.D.   On: 12/23/2021 11:27   DG Abd 2 Views  Result Date:  12/19/2021 CLINICAL DATA:  Abdominal bloating.  History of colon cancer. EXAM: ABDOMEN - 2 VIEW COMPARISON:  None Available. FINDINGS: The bowel gas pattern is normal. There is no evidence of free air. Phleboliths are noted in the pelvis. IMPRESSION: No abnormal bowel dilatation. Aortic Atherosclerosis (ICD10-I70.0). Electronically Signed   By: Marijo Conception M.D.   On: 12/19/2021 07:34    Microbiology: Results for orders placed or performed during the hospital encounter of 12/23/21  Resp Panel by RT-PCR (Flu A&B, Covid) Anterior Nasal Swab     Status: None   Collection Time: 12/23/21 10:51 AM   Specimen: Anterior Nasal Swab  Result Value Ref Range Status   SARS Coronavirus 2 by RT PCR NEGATIVE NEGATIVE Final    Comment: (NOTE) SARS-CoV-2 target nucleic acids are NOT DETECTED.  The SARS-CoV-2 RNA is generally detectable in upper respiratory specimens during the acute phase of infection. The lowest concentration of SARS-CoV-2 viral copies this assay can detect is 138 copies/mL. A negative result does not preclude SARS-Cov-2 infection and should not be used as the sole basis for treatment or other patient management decisions. A negative result may occur with  improper specimen collection/handling, submission of specimen other than nasopharyngeal swab, presence of viral mutation(s) within the areas targeted by this assay, and inadequate number of viral copies(<138 copies/mL). A negative result must be combined with clinical observations, patient history, and epidemiological information. The expected result is Negative.  Fact Sheet for Patients:  EntrepreneurPulse.com.au  Fact Sheet for Healthcare Providers:  IncredibleEmployment.be  This test is no t yet approved or cleared by the [place] FDA and  has been authorized for detection and/or diagnosis of SARS-CoV-2 by FDA under an Emergency Use Authorization (EUA). This EUA will remain  in effect  (meaning this test can be used) for the duration of the COVID-19 declaration under Section 564(b)(1) of the Act, 21 U.S.C.section 360bbb-3(b)(1), unless the authorization is terminated  or revoked sooner.       Influenza A by PCR NEGATIVE NEGATIVE Final   Influenza B by PCR NEGATIVE NEGATIVE Final    Comment: (NOTE) The Xpert Xpress SARS-CoV-2/FLU/RSV plus assay is intended as an aid in the diagnosis of influenza from Nasopharyngeal swab specimens and should not be used as a sole basis for treatment. Nasal washings and aspirates are unacceptable for Xpert Xpress SARS-CoV-2/FLU/RSV testing.  Fact Sheet for Patients: EntrepreneurPulse.com.au  Fact Sheet for Healthcare Providers: IncredibleEmployment.be  This test is not yet approved or cleared by the [place] FDA and has been authorized for detection and/or diagnosis of SARS-CoV-2 by FDA under an Emergency Use Authorization (EUA). This EUA will remain in effect (meaning this test can be used) for the duration of the COVID-19 declaration under Section 564(b)(1) of the Act, 21 U.S.C. section 360bbb-3(b)(1), unless the authorization is terminated or revoked.  Performed at [facility]  Lab, [address] [place], [place] [address]     Labs: CBC: Recent Labs  Lab 12/23/21 1107 12/23/21 1125  WBC 4.3  --   NEUTROABS 3.1  --   HGB 17.6* 18.4*  HCT 52.6* 54.0*  MCV 105.2*  --   PLT 146*  --    Basic Metabolic Panel: Recent Labs  Lab 12/23/21 1125 12/23/21 1220  NA 140 139  K 4.0 4.3  CL 99 100  CO2  --  29  GLUCOSE 134* 129*  BUN 37* 33*  CREATININE 1.10* 1.15*  CALCIUM  --  11.6*   Liver Function Tests: Recent Labs  Lab 12/23/21 1220  AST 30  ALT 17  ALKPHOS 50  BILITOT 0.9  PROT 7.0  ALBUMIN 3.9   CBG: No results for input(s): "GLUCAP" in the last 168 hours.  Discharge time spent: greater than 30 minutes.  Signed: Murray Hodgkins, MD [facility] 12/24/2021

## 2021-12-27 ENCOUNTER — Encounter (HOSPITAL_COMMUNITY): Payer: Self-pay | Admitting: Cardiovascular Disease

## 2021-12-27 ENCOUNTER — Telehealth: Payer: Self-pay | Admitting: Internal Medicine

## 2021-12-27 NOTE — Telephone Encounter (Signed)
Called pt to follow up on referral placed for hematology. Pt reports going to hospital Friday and discharging Saturday 12/24/21. Per pt she had a small stroke and afib which cardiologist placed monitor in chest and was told it will last up to 4 years. Medications were changed. Pt says need to f/u with cardiology but unsure who placed device in chest. Scheduled HFU on 1116/23 this Thursday.

## 2021-12-28 NOTE — Telephone Encounter (Signed)
Noted! Thank you

## 2021-12-30 ENCOUNTER — Ambulatory Visit (INDEPENDENT_AMBULATORY_CARE_PROVIDER_SITE_OTHER): Payer: Medicare Other | Admitting: Internal Medicine

## 2021-12-30 ENCOUNTER — Encounter: Payer: Self-pay | Admitting: Internal Medicine

## 2021-12-30 VITALS — BP 118/60 | HR 90 | Temp 98.1°F | Ht 60.0 in | Wt 167.0 lb

## 2021-12-30 DIAGNOSIS — Z8673 Personal history of transient ischemic attack (TIA), and cerebral infarction without residual deficits: Secondary | ICD-10-CM | POA: Diagnosis not present

## 2021-12-30 DIAGNOSIS — I5022 Chronic systolic (congestive) heart failure: Secondary | ICD-10-CM | POA: Diagnosis not present

## 2021-12-30 DIAGNOSIS — I10 Essential (primary) hypertension: Secondary | ICD-10-CM

## 2021-12-30 DIAGNOSIS — M5440 Lumbago with sciatica, unspecified side: Secondary | ICD-10-CM

## 2021-12-30 DIAGNOSIS — F439 Reaction to severe stress, unspecified: Secondary | ICD-10-CM

## 2021-12-30 DIAGNOSIS — D751 Secondary polycythemia: Secondary | ICD-10-CM

## 2021-12-30 DIAGNOSIS — G8929 Other chronic pain: Secondary | ICD-10-CM | POA: Diagnosis not present

## 2021-12-30 DIAGNOSIS — I6310 Cerebral infarction due to embolism of unspecified precerebral artery: Secondary | ICD-10-CM

## 2021-12-30 DIAGNOSIS — R140 Abdominal distension (gaseous): Secondary | ICD-10-CM

## 2021-12-30 LAB — CBC WITH DIFFERENTIAL/PLATELET
Basophils Absolute: 0 10*3/uL (ref 0.0–0.1)
Basophils Relative: 0.7 % (ref 0.0–3.0)
Eosinophils Absolute: 0.1 10*3/uL (ref 0.0–0.7)
Eosinophils Relative: 2 % (ref 0.0–5.0)
HCT: 48.8 % — ABNORMAL HIGH (ref 36.0–46.0)
Hemoglobin: 16.3 g/dL — ABNORMAL HIGH (ref 12.0–15.0)
Lymphocytes Relative: 17.3 % (ref 12.0–46.0)
Lymphs Abs: 1.1 10*3/uL (ref 0.7–4.0)
MCHC: 33.3 g/dL (ref 30.0–36.0)
MCV: 104.8 fl — ABNORMAL HIGH (ref 78.0–100.0)
Monocytes Absolute: 0.7 10*3/uL (ref 0.1–1.0)
Monocytes Relative: 11 % (ref 3.0–12.0)
Neutro Abs: 4.3 10*3/uL (ref 1.4–7.7)
Neutrophils Relative %: 69 % (ref 43.0–77.0)
Platelets: 174 10*3/uL (ref 150.0–400.0)
RBC: 4.66 Mil/uL (ref 3.87–5.11)
RDW: 15.2 % (ref 11.5–15.5)
WBC: 6.3 10*3/uL (ref 4.0–10.5)

## 2021-12-30 LAB — COMPREHENSIVE METABOLIC PANEL
ALT: 16 U/L (ref 0–35)
AST: 23 U/L (ref 0–37)
Albumin: 4.1 g/dL (ref 3.5–5.2)
Alkaline Phosphatase: 48 U/L (ref 39–117)
BUN: 37 mg/dL — ABNORMAL HIGH (ref 6–23)
CO2: 35 mEq/L — ABNORMAL HIGH (ref 19–32)
Calcium: 12.1 mg/dL — ABNORMAL HIGH (ref 8.4–10.5)
Chloride: 100 mEq/L (ref 96–112)
Creatinine, Ser: 1.11 mg/dL (ref 0.40–1.20)
GFR: 44.38 mL/min — ABNORMAL LOW (ref >60.00)
Glucose, Bld: 121 mg/dL — ABNORMAL HIGH (ref 70–99)
Potassium: 4.4 mEq/L (ref 3.5–5.1)
Sodium: 140 mEq/L (ref 135–145)
Total Bilirubin: 1.1 mg/dL (ref 0.2–1.2)
Total Protein: 7.2 g/dL (ref 6.0–8.3)

## 2021-12-30 NOTE — Assessment & Plan Note (Signed)
MRI: Small focus of acute infarct in the precentral gyrus on the right. Cryptogenic, likely embolic On Brilinta now On a loop recorder

## 2021-12-30 NOTE — Assessment & Plan Note (Signed)
Using a walker

## 2021-12-30 NOTE — Assessment & Plan Note (Signed)
Family stress discussed

## 2021-12-30 NOTE — Assessment & Plan Note (Signed)
On Furosemide On O2

## 2021-12-30 NOTE — Assessment & Plan Note (Signed)
Was on a Flagyl course

## 2021-12-30 NOTE — Progress Notes (Signed)
Subjective:  Patient ID: Gina [name], female    DOB: Jun 20, 1931  Age: 88 y.o. MRN: [id number]  CC: Follow-up   HPI Gina [name] presents for a CVA of unknown cause; on a loop recorder  Per hx:  " Admit date:     12/23/2021  Discharge date: 12/24/21  Discharge Physician: Murray Hodgkins    PCP: Cassandria Anger, MD    Recommendations at discharge:  Acute CVA --Changed Plavix to Brilinta given allergy to aspirin.  Continue indefinitely if patient is able, otherwise after 30 days resume Plavix. --followup with neurology in 2 monhts   Midcervical spine predominant degenerative change with  severe spinal canal stenosis at C4-C5, C5-C6, and C6-C7, as described above. -- Well-known to patient.  No acute intervention warranted.   Suspect CKD stage II --Follow-up as an outpatient   Polycythemia, stable since August 2022 --Follow-up as an outpatient   Discharge Diagnoses: Principal Problem:   Cryptogenic stroke ([place]) Active Problems:   Dyslipidemia   Gout   Essential hypertension   Hypoxemia   Polycythemia   Acute focal neurological deficit   DNR (do not resuscitate)   Chronic respiratory failure with hypoxia ([place])   Resolved Problems:   * No resolved hospital problems. *   Hospital Course: 88 year old woman presenting with left hand weakness, numbness, tingling, inability to hold objects admitted for the same and further evaluation.   Acute CVA, small right frontal infarct likely of embolic etiology from cryptogenic source with left hand weakness on admission --MRI showed small focus of acute infarct in the precentral gyrus on the right.  --Symptoms now resolved.  No therapy needs as an outpatient.  Work-up unrevealing.  In discussion with neurology, loop recorder was recommended to rule out occult atrial fibrillation.  It was placed same day by electrophysiology. --Change Plavix to Brilinta given allergy to aspirin.  Continue indefinitely if patient is  able, otherwise after 30 days resume Plavix.   Essential hypertension -Can resume antihypertensives after discharge   Hyperlipidemia -Continue Lipitor 40 mg daily   Gout -Continue allopurinol    Chronic hypoxic respiratory failure, nocturnal, COPD -Continue Trelegy -Continue supplemental oxygen as needed.   Neuropathy -Continue gabapentin   Midcervical spine predominant degenerative change with  severe spinal canal stenosis at C4-C5, C5-C6, and C6-C7, as described above. -- Well-known to patient.  No acute intervention warranted.   Suspect CKD stage II --Follow-up as an outpatient   Polycythemia, stable since August 2022 --Follow-up as an outpatient       Consultants:  Neurology Electrophysiology    Procedures performed:  Loop recorder insertion   Disposition: Home Diet recommendation:  Cardiac diet"  Outpatient Medications Prior to Visit  Medication Sig Dispense Refill   acetaminophen (TYLENOL) 500 MG tablet Take 500-1,000 mg by mouth See admin instructions. Take 1,000 mg by mouth in the morning, 500 mg at 4 PM, and 500 mg at bedtime     allopurinol (ZYLOPRIM) 100 MG tablet TAKE 1 TABLET DAILY (Patient taking differently: Take 100 mg by mouth in the morning.) 90 tablet 3   amLODipine (NORVASC) 5 MG tablet TAKE 1 TABLET DAILY 90 tablet 3   atorvastatin (LIPITOR) 40 MG tablet Take 1 tablet (40 mg total) by mouth every evening. 90 tablet 3   Calcium Carbonate (CALCIUM 600 PO) Take 600 mg by mouth in the morning and at bedtime.     cetirizine (ZYRTEC) 10 MG tablet Take 1 tablet (10 mg total) by mouth 2 (two) times  daily. (Patient taking differently: Take 10 mg by mouth in the morning.) 90 tablet 3   Fluticasone-Umeclidin-Vilant (TRELEGY ELLIPTA) 100-62.5-25 MCG/ACT AEPB USE 1 INHALATION ORALLY    DAILY (Patient taking differently: Inhale 1 puff into the lungs daily in the afternoon.) 180 each 3   gabapentin (NEURONTIN) 400 MG capsule TAKE 1 CAPSULE 4 TIMES     DAILY  (Patient taking differently: Take 400 mg by mouth See admin instructions. Take 400 mg by mouth at 4 AM, in the morning, 4 PM, and bedtime) 360 capsule 3   losartan (COZAAR) 50 MG tablet TAKE 1 TABLET DAILY (Patient taking differently: Take 50 mg by mouth in the morning.) 90 tablet 3   metroNIDAZOLE (FLAGYL) 500 MG tablet Take 1 tablet (500 mg total) by mouth 3 (three) times daily. 30 tablet 1   Multiple Vitamin (MULTIVITAMIN WITH MINERALS) TABS tablet Take 1 tablet by mouth daily with breakfast.     Multiple Vitamins-Minerals (PRESERVISION AREDS 2 PO) Take 1 capsule by mouth in the morning.     mupirocin ointment (BACTROBAN) 2 % Apply 1 Application topically daily as needed (for irritation- affected areas).     OXYGEN Inhale 2 L/min into the lungs at bedtime.     sodium chloride (OCEAN) 0.65 % SOLN nasal spray Place 1 spray into both nostrils as needed for congestion.     ticagrelor (BRILINTA) 90 MG TABS tablet Take 1 tablet (90 mg total) by mouth 2 (two) times daily. 60 tablet 2   torsemide (DEMADEX) 20 MG tablet Take 1-2 tablets (20-40 mg total) by mouth daily. (Patient taking differently: Take 20-40 mg by mouth in the morning.) 60 tablet 5   triamterene-hydrochlorothiazide (MAXZIDE-25) 37.5-25 MG tablet TAKE 1 TABLET DAILY (Patient taking differently: Take 1 tablet by mouth in the morning.) 90 tablet 3   vitamin C (ASCORBIC ACID) 500 MG tablet Take 500 mg by mouth every evening.     No facility-administered medications prior to visit.    ROS: Review of Systems  Constitutional:  Negative for activity change, appetite change, chills, fatigue and unexpected weight change.  HENT:  Negative for congestion, mouth sores and sinus pressure.   Eyes:  Positive for visual disturbance.  Respiratory:  Negative for cough, chest tightness and shortness of breath.   Gastrointestinal:  Negative for abdominal pain, nausea and vomiting.  Genitourinary:  Negative for difficulty urinating, frequency and vaginal  pain.  Musculoskeletal:  Positive for gait problem. Negative for back pain.  Skin:  Negative for pallor and rash.  Neurological:  Negative for dizziness, tremors, weakness, numbness and headaches.  Psychiatric/Behavioral:  Negative for confusion, sleep disturbance and suicidal ideas. The patient is nervous/anxious.   Healing wound L chest wall Using a walker  Objective:  BP 118/60 (BP Location: Right Arm, Patient Position: Sitting, Cuff Size: Normal)   Pulse 90   Temp 98.1 F (36.7 C) (Oral)   Ht 5' (1.524 m)   Wt 167 lb (75.8 kg)   SpO2 91%   BMI 32.61 kg/m   BP Readings from Last 3 Encounters:  12/30/21 118/60  12/24/21 (!) 155/75  12/16/21 120/82    Wt Readings from Last 3 Encounters:  12/30/21 167 lb (75.8 kg)  12/23/21 172 lb (78 kg)  12/16/21 173 lb (78.5 kg)    Physical Exam Constitutional:      General: She is not in acute distress.    Appearance: She is well-developed. She is obese.  HENT:     Head: Normocephalic.  Right Ear: External ear normal.     Left Ear: External ear normal.     Nose: Nose normal.  Eyes:     General:        Right eye: No discharge.        Left eye: No discharge.     Conjunctiva/sclera: Conjunctivae normal.     Pupils: Pupils are equal, round, and reactive to light.  Neck:     Thyroid: No thyromegaly.     Vascular: No JVD.     Trachea: No tracheal deviation.  Cardiovascular:     Rate and Rhythm: Normal rate and regular rhythm.     Heart sounds: Normal heart sounds.  Pulmonary:     Effort: No respiratory distress.     Breath sounds: No stridor. No wheezing.  Abdominal:     General: Bowel sounds are normal. There is no distension.     Palpations: Abdomen is soft. There is no mass.     Tenderness: There is no abdominal tenderness. There is no guarding or rebound.  Musculoskeletal:        General: No tenderness.     Cervical back: Normal range of motion and neck supple. No rigidity.  Lymphadenopathy:     Cervical: No  cervical adenopathy.  Skin:    Findings: No erythema or rash.  Neurological:     Cranial Nerves: No cranial nerve deficit.     Motor: No abnormal muscle tone.     Coordination: Coordination normal.     Deep Tendon Reflexes: Reflexes normal.  Psychiatric:        Behavior: Behavior normal.        Thought Content: Thought content normal.        Judgment: Judgment normal.    Using a walker  Safety.  This was washed and dressed so that you did not have it when you get out of bed because his visit it was noticed there was no indication that there is Lab Results  Component Value Date   WBC 4.3 12/23/2021   HGB 18.4 (H) 12/23/2021   HCT 54.0 (H) 12/23/2021   PLT 146 (L) 12/23/2021   GLUCOSE 129 (H) 12/23/2021   CHOL 105 12/24/2021   TRIG 139 12/24/2021   HDL 44 12/24/2021   LDLDIRECT 80.0 05/22/2019   LDLCALC 33 12/24/2021   ALT 17 12/23/2021   AST 30 12/23/2021   NA 139 12/23/2021   K 4.3 12/23/2021   CL 100 12/23/2021   CREATININE 1.15 (H) 12/23/2021   BUN 33 (H) 12/23/2021   CO2 29 12/23/2021   TSH 1.81 11/16/2021   INR 1.1 12/23/2021   HGBA1C 6.3 (H) 12/24/2021    VAS US CAROTID  Result Date: 12/27/2021 Carotid Arterial Duplex Study Patient Name:  Gina [name]  Date of Exam:   12/24/2021 Medical Rec #: [id number]          Accession #:    [id number] Date of Birth: August 24, 1931          Patient Gender: F Patient Age:   3 years Exam Location:  [facility] Procedure:      VAS US CAROTID Referring Phys: Lesleigh Noe --------------------------------------------------------------------------------  Indications:       CVA. Risk Factors:      Hypertension, hyperlipidemia, prior CVA, PAD. Comparison Study:  no prior Performing Technologist: Archie Patten RVS  Examination Guidelines: A complete evaluation includes B-mode imaging, spectral Doppler, color Doppler, and power Doppler as needed of all accessible portions of each  vessel. Bilateral testing is considered an  integral part of a complete examination. Limited examinations for reoccurring indications may be performed as noted.  Right Carotid Findings: +----------+--------+--------+--------+------------------+--------+           PSV cm/sEDV cm/sStenosisPlaque DescriptionComments +----------+--------+--------+--------+------------------+--------+ CCA Prox  67      11              heterogenous               +----------+--------+--------+--------+------------------+--------+ CCA Distal50      13              heterogenous               +----------+--------+--------+--------+------------------+--------+ ICA Prox  99      18      1-39%   heterogenous               +----------+--------+--------+--------+------------------+--------+ ICA Distal80      22                                         +----------+--------+--------+--------+------------------+--------+ ECA       117                                                +----------+--------+--------+--------+------------------+--------+ +----------+--------+-------+--------+-------------------+           PSV cm/sEDV cmsDescribeArm Pressure (mmHG) +----------+--------+-------+--------+-------------------+ [id number]                                         +----------+--------+-------+--------+-------------------+ +---------+--------+--+--------+--+---------+ VertebralPSV cm/s64EDV cm/s12Antegrade +---------+--------+--+--------+--+---------+  Left Carotid Findings: +----------+--------+--------+--------+------------------+--------+           PSV cm/sEDV cm/sStenosisPlaque DescriptionComments +----------+--------+--------+--------+------------------+--------+ CCA Prox  83      13              heterogenous               +----------+--------+--------+--------+------------------+--------+ CCA Distal65      15              heterogenous                +----------+--------+--------+--------+------------------+--------+ ICA Prox  65      23      1-39%   heterogenous               +----------+--------+--------+--------+------------------+--------+ ICA Distal51      14                                         +----------+--------+--------+--------+------------------+--------+ ECA       139                                                +----------+--------+--------+--------+------------------+--------+ +----------+--------+--------+--------+-------------------+           PSV cm/sEDV cm/sDescribeArm Pressure (mmHG) +----------+--------+--------+--------+-------------------+ [id number]                                         +----------+--------+--------+--------+-------------------+ +---------+--------+--+--------+--+---------+  VertebralPSV cm/s42EDV cm/s14Antegrade +---------+--------+--+--------+--+---------+   Summary: Right Carotid: Velocities in the right ICA are consistent with a 1-39% stenosis. Left Carotid: Velocities in the left ICA are consistent with a 1-39% stenosis. Vertebrals: Bilateral vertebral arteries demonstrate antegrade flow. *See table(s) above for measurements and observations.  Electronically signed by Antony Contras MD on 12/27/2021 at 8:30:59 AM.    Final    ECHOCARDIOGRAM COMPLETE  Result Date: 12/24/2021    ECHOCARDIOGRAM REPORT   Patient Name:   Gina [name] Date of Exam: 12/24/2021 Medical Rec #:  [id number]         Height:       60.0 in Accession #:    [id number]        Weight:       172.0 lb Date of Birth:  1931-04-05         BSA:          1.751 m Patient Age:    78 years          BP:           162/69 mmHg Patient Gender: F                 HR:           90 bpm. Exam Location:  Inpatient Procedure: 2D Echo, Color Doppler, Cardiac Doppler and Intracardiac            Opacification Agent Indications:    Stroke  History:        Patient has prior history of Echocardiogram examinations, most                  recent 02/23/2018. CHF, TIA, PAD and COPD; Risk                 Factors:Hypertension and Dyslipidemia. Cancer.  Sonographer:    Eartha Inch Referring Phys: [place]  Sonographer Comments: Technically difficult study due to poor echo windows. Image acquisition challenging due to patient body habitus, Image acquisition challenging due to respiratory motion and Image acquisition challenging due to COPD. IMPRESSIONS  1. Left ventricular ejection fraction, by estimation, is 60 to 65%. Left ventricular ejection fraction by PLAX is 62 %. The left ventricle has normal function. The left ventricle has no regional wall motion abnormalities. Left ventricular diastolic parameters are consistent with Grade I diastolic dysfunction (impaired relaxation). Elevated left ventricular end-diastolic pressure. The E/e' is 51.  2. Right ventricular systolic function is mildly reduced. The right ventricular size is normal. There is normal pulmonary artery systolic pressure. The estimated right ventricular systolic pressure is 75.6 mmHg.  3. The mitral valve is abnormal. Trivial mitral valve regurgitation.  4. The aortic valve is tricuspid. Aortic valve regurgitation is not visualized. Aortic valve sclerosis/calcification is present, without any evidence of aortic stenosis.  5. The inferior vena cava is normal in size with greater than 50% respiratory variability, suggesting right atrial pressure of 3 mmHg. Comparison(s): Changes from prior study are noted. 02/23/2018: LVEF 55-60%, grade 1 DD, trivial pericardial effusion. FINDINGS  Left Ventricle: Left ventricular ejection fraction, by estimation, is 60 to 65%. Left ventricular ejection fraction by PLAX is 62 %. The left ventricle has normal function. The left ventricle has no regional wall motion abnormalities. Definity contrast agent was given IV to delineate the left ventricular endocardial borders. The left ventricular internal cavity size was normal in size.  There is no left ventricular hypertrophy. Left ventricular diastolic parameters are consistent with Grade I diastolic  dysfunction (impaired relaxation). Elevated left ventricular end-diastolic pressure. The E/e' is 80. Right Ventricle: The right ventricular size is normal. No increase in right ventricular wall thickness. Right ventricular systolic function is mildly reduced. There is normal pulmonary artery systolic pressure. The tricuspid regurgitant velocity is 2.51 m/s, and with an assumed right atrial pressure of 3 mmHg, the estimated right ventricular systolic pressure is 65.6 mmHg. Left Atrium: Left atrial size was normal in size. Right Atrium: Right atrial size was normal in size. Pericardium: Trivial pericardial effusion is present. The pericardial effusion is posterior to the left ventricle. Mitral Valve: The mitral valve is abnormal. Mild to moderate mitral annular calcification. Trivial mitral valve regurgitation. MV peak gradient, 8.2 mmHg. The mean mitral valve gradient is 4.0 mmHg. Tricuspid Valve: The tricuspid valve is grossly normal. Tricuspid valve regurgitation is trivial. Aortic Valve: The aortic valve is tricuspid. Aortic valve regurgitation is not visualized. Aortic valve sclerosis/calcification is present, without any evidence of aortic stenosis. Pulmonic Valve: The pulmonic valve was grossly normal. Pulmonic valve regurgitation is trivial. Aorta: The aortic root and ascending aorta are structurally normal, with no evidence of dilitation. Venous: The inferior vena cava is normal in size with greater than 50% respiratory variability, suggesting right atrial pressure of 3 mmHg. IAS/Shunts: No atrial level shunt detected by color flow Doppler.  LEFT VENTRICLE PLAX 2D LV EF:         Left            Diastology                ventricular     LV e' medial:    4.38 cm/s                ejection        LV E/e' medial:  17.9                fraction by     LV e' lateral:   4.03 cm/s                PLAX is  62      LV E/e' lateral: 19.5                %. LVIDd:         4.40 cm LVIDs:         2.95 cm LV PW:         0.70 cm LV IVS:        0.70 cm LVOT diam:     1.90 cm LV SV:         53 LV SV Index:   30 LVOT Area:     2.84 cm  LV Volumes (MOD) LV vol d, MOD    49.4 ml A2C: LV vol d, MOD    88.0 ml A4C: LV vol s, MOD    22.9 ml A2C: LV vol s, MOD    35.3 ml A4C: LV SV MOD A2C:   26.5 ml LV SV MOD A4C:   88.0 ml LV SV MOD BP:    37.9 ml RIGHT VENTRICLE            IVC RV S prime:     9.90 cm/s  IVC diam: 0.70 cm TAPSE (M-mode): 1.2 cm LEFT ATRIUM             Index        RIGHT ATRIUM           Index LA diam:  4.10 cm 2.34 cm/m   RA Area:     17.70 cm LA Vol (A2C):   65.6 ml 37.47 ml/m  RA Volume:   49.00 ml  27.99 ml/m LA Vol (A4C):   45.6 ml 26.05 ml/m LA Biplane Vol: 55.1 ml 31.47 ml/m  AORTIC VALVE             PULMONIC VALVE LVOT Vmax:   96.10 cm/s  PR End Diast Vel: 7.29 msec LVOT Vmean:  62.500 cm/s LVOT VTI:    0.188 m  AORTA Ao Root diam: 2.70 cm Ao Asc diam:  3.60 cm MITRAL VALVE                TRICUSPID VALVE MV Area (PHT): 3.33 cm     TR Peak grad:   25.2 mmHg MV Area VTI:   1.96 cm     TR Vmax:        251.00 cm/s MV Peak grad:  8.2 mmHg MV Mean grad:  4.0 mmHg     SHUNTS MV Vmax:       1.43 m/s     Systemic VTI:  0.19 m MV Vmean:      90.7 cm/s    Systemic Diam: 1.90 cm MV Decel Time: 228 msec MV E velocity: 78.60 cm/s MV A velocity: 129.00 cm/s MV E/A ratio:  0.61 Lyman Bishop MD Electronically signed by Lyman Bishop MD Signature Date/Time: 12/24/2021/12:14:00 PM    Final    MR ANGIO HEAD WO CONTRAST  Result Date: 12/24/2021 CLINICAL DATA:  Left hand weakness and numbness; small acute infarct on MRI EXAM: MRA HEAD WITHOUT CONTRAST TECHNIQUE: Angiographic images of the Circle of Willis were acquired using MRA technique without intravenous contrast. COMPARISON:  MRA head 11/13/2009, correlation is made with CTA head 02/22/2018 FINDINGS: Redemonstrated focus of restricted diffusion with ADC  correlate in the right precentral gyrus (series 5, image 88), compatible with acute infarct. Anterior circulation: Both internal carotid arteries are patent to the termini, without significant stenosis. A1 segments patent. Normal anterior communicating artery. Anterior cerebral arteries are patent to their distal aspects. No M1 stenosis or occlusion. Distal MCA branches perfused and symmetric. Posterior circulation: Vertebral arteries patent to the vertebrobasilar junction without stenosis. Posterior inferior cerebral arteries patent bilaterally. Basilar patent to its distal aspect. Superior cerebellar arteries patent bilaterally. Patent P1 segments. PCAs perfused to their distal aspects without stenosis. The bilateral posterior communicating arteries are not visualized. Anatomic variants: None significant IMPRESSION: No intracranial large vessel occlusion or significant stenosis. Electronically Signed   By: Merilyn Baba M.D.   On: 12/24/2021 03:23   MR CERVICAL SPINE W WO CONTRAST  Result Date: 12/23/2021 CLINICAL DATA:  Left finger weakness EXAM: MRI CERVICAL SPINE WITHOUT AND WITH CONTRAST TECHNIQUE: Multiplanar and multiecho pulse sequences of the cervical spine, to include the craniocervical junction and cervicothoracic junction, were obtained without and with intravenous contrast. CONTRAST:  40mL GADAVIST GADOBUTROL 1 MMOL/ML IV SOLN COMPARISON:  None Available. FINDINGS: Alignment: There is trace anterolisthesis of C3 on C4 and trace retrolisthesis of C4 on C5. Vertebrae: No fracture, evidence of discitis, or bone lesion. Cord: Normal signal and morphology. Posterior Fossa, vertebral arteries, paraspinal tissues: Negative. Disc levels: C1-C2: Mild degenerative change. C2-C3: Moderate bilateral facet degenerative change. Minimal disc bulge. No spinal canal stenosis. No significant neural foraminal stenosis. C3-C4: Small central disc protrusion that contacts the ventral spinal cord. Severe bilateral facet  degenerative change. Ligamentum flavum hypertrophy. Mild spinal canal stenosis. Uncovertebral hypertrophy. Mild bilateral  neural foraminal stenosis. C4-C5: Moderate disc space loss. Circumferential disc bulge. Ligamentum flavum hypertrophy. Moderate bilateral facet degenerative change. Uncovertebral hypertrophy. Moderate to severe spinal canal stenosis. Severe bilateral neural foraminal stenosis. C5-C6: Ligamentum flavum hypertrophy. Circumferential disc bulge. Uncovertebral hypertrophy. Moderate bilateral facet degenerative change. Severe spinal canal and bilateral neural foraminal stenosis. C6-C7: Moderate disc space loss. Ligamentum flavum hypertrophy. Mild bilateral facet degenerative change. Uncovertebral hypertrophy. Severe bilateral neural foraminal stenosis. Severe spinal canal stenosis. C7-T1: Severe bilateral facet degenerative change. Ligamentum flavum hypertrophy. No significant spinal canal or neural foraminal stenosis. IMPRESSION: 1. Midcervical spine predominant degenerative change with severe spinal canal stenosis at C4-C5, C5-C6, and C6-C7, as described above. 2. Severe bilateral neural foraminal stenosis is also noted at C4-C5, C5-C6, and C6-C7. 3. No evidence of cord signal abnormality or abnormal contrast enhancement. Electronically Signed   By: Marin Roberts M.D.   On: 12/23/2021 16:44   MR BRAIN WO CONTRAST  Result Date: 12/23/2021 CLINICAL DATA:  Left finger weakness EXAM: MRI HEAD WITHOUT CONTRAST TECHNIQUE: Multiplanar, multiecho pulse sequences of the brain and surrounding structures were obtained without intravenous contrast. COMPARISON:  None Available. FINDINGS: Brain: Small acute infarct in the precentral gyrus on the right (series 5, image 88).There is sequela of severe chronic microvascular ischemic change. No extra-axial fluid collection. There is a small focus of microhemorrhage in the right cerebellar hemisphere. Vascular: Normal flow voids. Skull and upper cervical spine: No  worrisome lesions in the skull. See separately dictated report for cervical spine findings. Sinuses/Orbits: Bilateral lens replacement. Likely a mucous retention cyst in the left maxillary sinus. Mild mucosal thickening bilateral ethmoid sinuses. Other: None IMPRESSION: Small focus of acute infarct in the precentral gyrus on the right. Electronically Signed   By: Marin Roberts M.D.   On: 12/23/2021 16:36   CT HEAD WO CONTRAST  Result Date: 12/23/2021 CLINICAL DATA:  Acute neuro deficit.  Rule out stroke.  Weakness EXAM: CT HEAD WITHOUT CONTRAST TECHNIQUE: Contiguous axial images were obtained from the base of the skull through the vertex without intravenous contrast. RADIATION DOSE REDUCTION: This exam was performed according to the departmental dose-optimization program which includes automated exposure control, adjustment of the mA and/or kV according to patient size and/or use of iterative reconstruction technique. COMPARISON:  CT head 11/12/2009.  MRI head 02/22/2018 FINDINGS: Brain: Mild atrophy. Negative for hydrocephalus. Moderate to advanced white matter changes with diffuse white matter hypodensity. Negative for acute infarct, hemorrhage, mass Vascular: Negative for hyperdense vessel Skull: Negative Sinuses/Orbits: Mild mucosal edema paranasal sinuses. 5 mm osteoma left ethmoid sinus. Bilateral cataract extraction Other: None IMPRESSION: Atrophy and moderate to advanced chronic microvascular ischemia. No acute abnormality. Electronically Signed   By: Franchot Gallo M.D.   On: 12/23/2021 11:33   DG Chest 2 View  Result Date: 12/23/2021 CLINICAL DATA:  Short of breath EXAM: CHEST - 2 VIEW COMPARISON:  Chest 10/01/2020 FINDINGS: Cardiac enlargement without heart failure. Mild airspace disease in lung bases best seen on the lateral view. Probable atelectasis. No effusion. Lungs show mild hyperinflation. Atherosclerotic calcification aortic arch. IMPRESSION: 1. Cardiac enlargement without heart failure.  2. Mild bibasilar atelectasis. Electronically Signed   By: Franchot Gallo M.D.   On: 12/23/2021 11:27    Assessment & Plan:   Problem List Items Addressed This Visit     Essential hypertension    Cont on Norvasc, Triamt/HCTZ, Losartan BP is nl at home      Low back pain    Using a walker  CHF (congestive heart failure) (HCC)    On Furosemide On O2      Relevant Orders   CBC with Differential/Platelet   Comprehensive metabolic panel   Polycythemia    Check CBC Hematology consultation pending CVA 12/2021 (cryptogenic) discussed Goal Hct <45%      Meteorism    Was on a Flagyl course      CVA (cerebral vascular accident) ([place]) - Primary    MRI: Small focus of acute infarct in the precentral gyrus on the right. Cryptogenic, likely embolic On Brilinta now On a loop recorder      Relevant Orders   CBC with Differential/Platelet   Comprehensive metabolic panel   Stress at home    Family stress discussed         No orders of the defined types were placed in this encounter.     Follow-up: Return for a follow-up visit.  Walker Kehr, MD

## 2021-12-30 NOTE — Assessment & Plan Note (Addendum)
Check CBC Hematology consultation pending CVA 12/2021 (cryptogenic) discussed Goal Hct <45%

## 2021-12-30 NOTE — Assessment & Plan Note (Signed)
Cont on Norvasc, Triamt/HCTZ, Losartan BP is nl at home

## 2022-01-03 ENCOUNTER — Telehealth: Payer: Self-pay | Admitting: Internal Medicine

## 2022-01-03 NOTE — Telephone Encounter (Signed)
Pt called asking to get a message to Dr. Judeen Hammans medical assistant. Pt asking what to do for gas symptoms extending to entire stomach now. What should she do? Please advise pt [phone]

## 2022-01-04 NOTE — Telephone Encounter (Signed)
Take milk of magnesia as directed Use Fleet enema in addition to milk of magnesia if needed.  Thank you

## 2022-01-04 NOTE — Telephone Encounter (Signed)
Pt called to report NO BOWEL MOVEMENT since Friday 12/31/21. Please advise [phone]

## 2022-01-05 NOTE — Telephone Encounter (Signed)
Notified pt w/MD response. Pt state she had a BM on yesterday, but will get her what MD stated she can take../l,mb

## 2022-01-10 ENCOUNTER — Emergency Department (HOSPITAL_COMMUNITY): Payer: Medicare Other

## 2022-01-10 ENCOUNTER — Encounter (HOSPITAL_COMMUNITY): Payer: Self-pay

## 2022-01-10 ENCOUNTER — Other Ambulatory Visit: Payer: Self-pay

## 2022-01-10 ENCOUNTER — Inpatient Hospital Stay (HOSPITAL_COMMUNITY)
Admission: EM | Admit: 2022-01-10 | Discharge: 2022-01-15 | DRG: 641 | Disposition: A | Discharge status: Home Health | Payer: Medicare Other | Attending: Internal Medicine | Admitting: Internal Medicine

## 2022-01-10 DIAGNOSIS — E871 Hypo-osmolality and hyponatremia: Secondary | ICD-10-CM | POA: Diagnosis not present

## 2022-01-10 DIAGNOSIS — R634 Abnormal weight loss: Secondary | ICD-10-CM | POA: Diagnosis present

## 2022-01-10 DIAGNOSIS — Z880 Allergy status to penicillin: Secondary | ICD-10-CM

## 2022-01-10 DIAGNOSIS — Z85038 Personal history of other malignant neoplasm of large intestine: Secondary | ICD-10-CM

## 2022-01-10 DIAGNOSIS — I130 Hypertensive heart and chronic kidney disease with heart failure and stage 1 through stage 4 chronic kidney disease, or unspecified chronic kidney disease: Secondary | ICD-10-CM | POA: Diagnosis present

## 2022-01-10 DIAGNOSIS — I2489 Other forms of acute ischemic heart disease: Secondary | ICD-10-CM | POA: Diagnosis present

## 2022-01-10 DIAGNOSIS — G629 Polyneuropathy, unspecified: Secondary | ICD-10-CM | POA: Diagnosis present

## 2022-01-10 DIAGNOSIS — Z87891 Personal history of nicotine dependence: Secondary | ICD-10-CM | POA: Diagnosis not present

## 2022-01-10 DIAGNOSIS — Z886 Allergy status to analgesic agent status: Secondary | ICD-10-CM

## 2022-01-10 DIAGNOSIS — Z7902 Long term (current) use of antithrombotics/antiplatelets: Secondary | ICD-10-CM

## 2022-01-10 DIAGNOSIS — Z803 Family history of malignant neoplasm of breast: Secondary | ICD-10-CM

## 2022-01-10 DIAGNOSIS — N1832 Chronic kidney disease, stage 3b: Secondary | ICD-10-CM | POA: Diagnosis present

## 2022-01-10 DIAGNOSIS — R911 Solitary pulmonary nodule: Secondary | ICD-10-CM | POA: Diagnosis present

## 2022-01-10 DIAGNOSIS — M4800 Spinal stenosis, site unspecified: Secondary | ICD-10-CM | POA: Diagnosis present

## 2022-01-10 DIAGNOSIS — R531 Weakness: Secondary | ICD-10-CM | POA: Diagnosis not present

## 2022-01-10 DIAGNOSIS — D7589 Other specified diseases of blood and blood-forming organs: Secondary | ICD-10-CM | POA: Diagnosis present

## 2022-01-10 DIAGNOSIS — R627 Adult failure to thrive: Secondary | ICD-10-CM | POA: Diagnosis not present

## 2022-01-10 DIAGNOSIS — J449 Chronic obstructive pulmonary disease, unspecified: Secondary | ICD-10-CM | POA: Diagnosis present

## 2022-01-10 DIAGNOSIS — Z79899 Other long term (current) drug therapy: Secondary | ICD-10-CM | POA: Diagnosis not present

## 2022-01-10 DIAGNOSIS — Z6830 Body mass index (BMI) 30.0-30.9, adult: Secondary | ICD-10-CM | POA: Diagnosis not present

## 2022-01-10 DIAGNOSIS — R079 Chest pain, unspecified: Secondary | ICD-10-CM | POA: Diagnosis not present

## 2022-01-10 DIAGNOSIS — E785 Hyperlipidemia, unspecified: Secondary | ICD-10-CM | POA: Diagnosis present

## 2022-01-10 DIAGNOSIS — M48061 Spinal stenosis, lumbar region without neurogenic claudication: Secondary | ICD-10-CM | POA: Diagnosis present

## 2022-01-10 DIAGNOSIS — M1990 Unspecified osteoarthritis, unspecified site: Secondary | ICD-10-CM | POA: Diagnosis present

## 2022-01-10 DIAGNOSIS — R778 Other specified abnormalities of plasma proteins: Secondary | ICD-10-CM | POA: Diagnosis not present

## 2022-01-10 DIAGNOSIS — I214 Non-ST elevation (NSTEMI) myocardial infarction: Secondary | ICD-10-CM | POA: Diagnosis present

## 2022-01-10 DIAGNOSIS — R54 Age-related physical debility: Secondary | ICD-10-CM | POA: Diagnosis present

## 2022-01-10 DIAGNOSIS — Z86010 Personal history of colonic polyps: Secondary | ICD-10-CM

## 2022-01-10 DIAGNOSIS — I739 Peripheral vascular disease, unspecified: Secondary | ICD-10-CM | POA: Diagnosis present

## 2022-01-10 DIAGNOSIS — R251 Tremor, unspecified: Secondary | ICD-10-CM | POA: Diagnosis present

## 2022-01-10 DIAGNOSIS — M5416 Radiculopathy, lumbar region: Secondary | ICD-10-CM | POA: Diagnosis not present

## 2022-01-10 DIAGNOSIS — M109 Gout, unspecified: Secondary | ICD-10-CM | POA: Diagnosis present

## 2022-01-10 DIAGNOSIS — E860 Dehydration: Principal | ICD-10-CM | POA: Diagnosis present

## 2022-01-10 DIAGNOSIS — K5900 Constipation, unspecified: Secondary | ICD-10-CM | POA: Diagnosis present

## 2022-01-10 DIAGNOSIS — Z800 Family history of malignant neoplasm of digestive organs: Secondary | ICD-10-CM

## 2022-01-10 DIAGNOSIS — J9611 Chronic respiratory failure with hypoxia: Secondary | ICD-10-CM | POA: Diagnosis present

## 2022-01-10 DIAGNOSIS — Z8673 Personal history of transient ischemic attack (TIA), and cerebral infarction without residual deficits: Secondary | ICD-10-CM | POA: Diagnosis not present

## 2022-01-10 DIAGNOSIS — Z91048 Other nonmedicinal substance allergy status: Secondary | ICD-10-CM

## 2022-01-10 DIAGNOSIS — Z825 Family history of asthma and other chronic lower respiratory diseases: Secondary | ICD-10-CM

## 2022-01-10 DIAGNOSIS — Z8249 Family history of ischemic heart disease and other diseases of the circulatory system: Secondary | ICD-10-CM

## 2022-01-10 DIAGNOSIS — D751 Secondary polycythemia: Secondary | ICD-10-CM | POA: Diagnosis not present

## 2022-01-10 DIAGNOSIS — R000 Tachycardia, unspecified: Secondary | ICD-10-CM | POA: Diagnosis not present

## 2022-01-10 DIAGNOSIS — Z1152 Encounter for screening for COVID-19: Secondary | ICD-10-CM | POA: Diagnosis not present

## 2022-01-10 DIAGNOSIS — I5032 Chronic diastolic (congestive) heart failure: Secondary | ICD-10-CM | POA: Diagnosis not present

## 2022-01-10 DIAGNOSIS — I69365 Other paralytic syndrome following cerebral infarction, bilateral: Secondary | ICD-10-CM | POA: Diagnosis not present

## 2022-01-10 DIAGNOSIS — I10 Essential (primary) hypertension: Secondary | ICD-10-CM | POA: Diagnosis not present

## 2022-01-10 DIAGNOSIS — Z83438 Family history of other disorder of lipoprotein metabolism and other lipidemia: Secondary | ICD-10-CM

## 2022-01-10 DIAGNOSIS — I249 Acute ischemic heart disease, unspecified: Secondary | ICD-10-CM | POA: Diagnosis present

## 2022-01-10 DIAGNOSIS — S2242XA Multiple fractures of ribs, left side, initial encounter for closed fracture: Secondary | ICD-10-CM | POA: Diagnosis not present

## 2022-01-10 DIAGNOSIS — I959 Hypotension, unspecified: Secondary | ICD-10-CM | POA: Diagnosis not present

## 2022-01-10 DIAGNOSIS — E440 Moderate protein-calorie malnutrition: Secondary | ICD-10-CM | POA: Diagnosis not present

## 2022-01-10 DIAGNOSIS — Z888 Allergy status to other drugs, medicaments and biological substances status: Secondary | ICD-10-CM

## 2022-01-10 DIAGNOSIS — R0689 Other abnormalities of breathing: Secondary | ICD-10-CM | POA: Diagnosis not present

## 2022-01-10 DIAGNOSIS — Z7951 Long term (current) use of inhaled steroids: Secondary | ICD-10-CM

## 2022-01-10 DIAGNOSIS — R7989 Other specified abnormal findings of blood chemistry: Secondary | ICD-10-CM | POA: Diagnosis not present

## 2022-01-10 DIAGNOSIS — M4316 Spondylolisthesis, lumbar region: Secondary | ICD-10-CM | POA: Diagnosis not present

## 2022-01-10 DIAGNOSIS — R0902 Hypoxemia: Secondary | ICD-10-CM | POA: Diagnosis not present

## 2022-01-10 LAB — COMPREHENSIVE METABOLIC PANEL
ALT: 25 U/L (ref 0–44)
AST: 33 U/L (ref 15–41)
Albumin: 3.8 g/dL (ref 3.5–5.0)
Alkaline Phosphatase: 55 U/L (ref 38–126)
Anion gap: 11 (ref 5–15)
BUN: 40 mg/dL — ABNORMAL HIGH (ref 8–23)
CO2: 30 mmol/L (ref 22–32)
Calcium: 12.9 mg/dL — ABNORMAL HIGH (ref 8.9–10.3)
Chloride: 97 mmol/L — ABNORMAL LOW (ref 98–111)
Creatinine, Ser: 1.25 mg/dL — ABNORMAL HIGH (ref 0.44–1.00)
GFR, Estimated: 41 mL/min — ABNORMAL LOW (ref >60)
Glucose, Bld: 118 mg/dL — ABNORMAL HIGH (ref 70–99)
Potassium: 4.4 mmol/L (ref 3.5–5.1)
Sodium: 138 mmol/L (ref 135–145)
Total Bilirubin: 1 mg/dL (ref 0.3–1.2)
Total Protein: 6.9 g/dL (ref 6.5–8.1)

## 2022-01-10 LAB — RESP PANEL BY RT-PCR (RSV, FLU A&B, COVID)  RVPGX2
Influenza A by PCR: NEGATIVE
Influenza B by PCR: NEGATIVE
Resp Syncytial Virus by PCR: NEGATIVE
SARS Coronavirus 2 by RT PCR: NEGATIVE

## 2022-01-10 LAB — CBC WITH DIFFERENTIAL/PLATELET
Abs Immature Granulocytes: 0.04 10*3/uL (ref 0.00–0.07)
Basophils Absolute: 0 10*3/uL (ref 0.0–0.1)
Basophils Relative: 0 %
Eosinophils Absolute: 0.1 10*3/uL (ref 0.0–0.5)
Eosinophils Relative: 1 %
HCT: 46.1 % — ABNORMAL HIGH (ref 36.0–46.0)
Hemoglobin: 15 g/dL (ref 12.0–15.0)
Immature Granulocytes: 0 %
Lymphocytes Relative: 11 %
Lymphs Abs: 1 10*3/uL (ref 0.7–4.0)
MCH: 34.5 pg — ABNORMAL HIGH (ref 26.0–34.0)
MCHC: 32.5 g/dL (ref 30.0–36.0)
MCV: 106 fL — ABNORMAL HIGH (ref 80.0–100.0)
Monocytes Absolute: 1 10*3/uL (ref 0.1–1.0)
Monocytes Relative: 11 %
Neutro Abs: 7 10*3/uL (ref 1.7–7.7)
Neutrophils Relative %: 77 %
Platelets: 118 10*3/uL — ABNORMAL LOW (ref 150–400)
RBC: 4.35 MIL/uL (ref 3.87–5.11)
RDW: 13.7 % (ref 11.5–15.5)
WBC: 9.2 10*3/uL (ref 4.0–10.5)
nRBC: 0 % (ref 0.0–0.2)

## 2022-01-10 LAB — TROPONIN I (HIGH SENSITIVITY)
Troponin I (High Sensitivity): 255 ng/L (ref <18)
Troponin I (High Sensitivity): 302 ng/L (ref <18)

## 2022-01-10 MED ORDER — SODIUM CHLORIDE 0.9 % IV SOLN: Freq: Once | INTRAVENOUS | Status: AC

## 2022-01-10 NOTE — ED Triage Notes (Signed)
BIB EMS from home . Admission recently for stroke.  Pt states she has had generalized weakness, FTT, no appetite.  States she has had "tremors" off and on but today are constant.  3L [place].  Normally only on O2 at night.  Sats were 89% when EMS arrived.  156/78, 94, O2 96% on 3L. CBG 192

## 2022-01-10 NOTE — ED Notes (Signed)
Patient transported to X-ray 

## 2022-01-10 NOTE — ED Provider Notes (Signed)
[facility]PART[facility] Provider Note   CSN: [id number] Arrival date & time: 01/10/22  1704     History  Chief Complaint  Patient presents with   Gina [name] is a 88 y.o. female, history of CVA, CHF, who presents to the ED secondary to weakness, reduced appetite for the last 2 weeks.  She states that she had a stroke 2 weeks ago only deficit is difficulty with using her left hand, unhooking her bra, and eating with the left hand.  Since then she has had reduced appetite, has not been eating or drinking much, and feels very weak.  She denies any urinary symptoms, does state that she has shortness of breath on exertion, and became alarmed today, when she attempts to sit up in her chair, and she felt like she could not move both of her legs.  She stated this lasted for some time, so she called her neighbor, and they came and tried to get her up, she was able to walk a little bit, but felt very weak, and she sat to the floor.  Came to the ER given the persistent weakness, and difficulty using her legs.  She denies any one-sided weakness, difficulty with speech, slurring of her words, or facial droop.  She denies any chest pain, does endorse shortness of breath that is worse with walking but says this is this is been going on for the last few weeks.  Of note patient lives alone.   She does state that she has had some constipation, but took milk of magnesia 1 day ago, and had 6 bowel movements and feels better.  Still reports poor appetite however.  Home Medications Prior to Admission medications   Medication Sig Start Date End Date Taking? Authorizing Provider  acetaminophen (TYLENOL) 500 MG tablet Take 500-1,000 mg by mouth See admin instructions. Take 1,000 mg by mouth in the morning, 500 mg at 4 PM, and 500 mg at bedtime   Yes [provider]  allopurinol (ZYLOPRIM) 100 MG tablet TAKE 1 TABLET DAILY Patient taking differently:  Take 100 mg by mouth in the morning. 09/01/21  Yes Plotnikov, Evie Lacks, MD  amLODipine (NORVASC) 5 MG tablet TAKE 1 TABLET DAILY 09/01/21  Yes Plotnikov, Evie Lacks, MD  atorvastatin (LIPITOR) 40 MG tablet Take 1 tablet (40 mg total) by mouth every evening. 10/01/20  Yes Plotnikov, Evie Lacks, MD  Calcium Carbonate (CALCIUM 600 PO) Take 600 mg by mouth in the morning and at bedtime.   Yes [provider]  cetirizine (ZYRTEC) 10 MG tablet Take 1 tablet (10 mg total) by mouth 2 (two) times daily. Patient taking differently: Take 10 mg by mouth in the morning. 10/01/20  Yes Plotnikov, Evie Lacks, MD  Fluticasone-Umeclidin-Vilant (TRELEGY ELLIPTA) 100-62.5-25 MCG/ACT AEPB USE 1 INHALATION ORALLY    DAILY Patient taking differently: Inhale 1 puff into the lungs daily in the afternoon. 10/20/21  Yes Plotnikov, Evie Lacks, MD  gabapentin (NEURONTIN) 400 MG capsule TAKE 1 CAPSULE 4 TIMES     DAILY Patient taking differently: Take 400 mg by mouth 4 (four) times daily. Take 400 mg by mouth at 4 AM,10 AM, 4 PM, 11 PM 12/09/21  Yes Plotnikov, Evie Lacks, MD  losartan (COZAAR) 50 MG tablet TAKE 1 TABLET DAILY Patient taking differently: Take 50 mg by mouth in the morning. 09/01/21  Yes Plotnikov, Evie Lacks, MD  Multiple Vitamin (MULTIVITAMIN WITH MINERALS) TABS tablet Take 1 tablet  by mouth daily with breakfast.   Yes [provider]  Multiple Vitamins-Minerals (PRESERVISION AREDS 2 PO) Take 1 capsule by mouth in the morning.   Yes [provider]  mupirocin ointment (BACTROBAN) 2 % Apply 1 Application topically daily as needed (for irritation- affected areas). 08/20/21  Yes [provider]  OXYGEN Inhale 2 L/min into the lungs at bedtime.   Yes [provider]  ticagrelor (BRILINTA) 90 MG TABS tablet Take 1 tablet (90 mg total) by mouth 2 (two) times daily. 12/25/21  Yes Samuella Cota, MD  torsemide (DEMADEX) 20 MG tablet Take 1-2 tablets (20-40 mg total) by mouth  daily. Patient taking differently: Take 20-40 mg by mouth in the morning. 09/02/21  Yes Plotnikov, Evie Lacks, MD  triamterene-hydrochlorothiazide (MAXZIDE-25) 37.5-25 MG tablet TAKE 1 TABLET DAILY Patient taking differently: Take 1 tablet by mouth in the morning. 09/01/21  Yes Plotnikov, Evie Lacks, MD  vitamin C (ASCORBIC ACID) 500 MG tablet Take 500 mg by mouth every evening.   Yes [provider]  metroNIDAZOLE (FLAGYL) 500 MG tablet Take 1 tablet (500 mg total) by mouth 3 (three) times daily. Patient not taking: Reported on 01/10/2022 12/16/21   Plotnikov, Evie Lacks, MD  sodium chloride (OCEAN) 0.65 % SOLN nasal spray Place 1 spray into both nostrils as needed for congestion.    [provider]      Allergies    Penicillins, Aspirin, Enalapril, Flonase [fluticasone], and Adhesive [tape]    Review of Systems   Review of Systems  Respiratory:  Positive for shortness of breath.   Cardiovascular:  Negative for chest pain.    Physical Exam Updated Vital Signs BP (!) 145/59   Pulse 84   Temp 98.1 F (36.7 C) (Oral)   Resp 20   Ht 5' (1.524 m)   Wt 74 kg   SpO2 98%   BMI 31.86 kg/m  Physical Exam Vitals and nursing note reviewed.  Constitutional:      General: She is not in acute distress.    Appearance: She is well-developed.  HENT:     Head: Normocephalic and atraumatic.  Eyes:     Conjunctiva/sclera: Conjunctivae normal.  Cardiovascular:     Rate and Rhythm: Normal rate and regular rhythm.     Heart sounds: No murmur heard. Pulmonary:     Effort: Pulmonary effort is normal. No respiratory distress.     Breath sounds: Normal breath sounds.  Abdominal:     Palpations: Abdomen is soft.     Tenderness: There is no abdominal tenderness.  Musculoskeletal:     Cervical back: Neck supple.     Right lower leg: Edema present.     Left lower leg: Edema present.     Comments: 5 out of 5 strength of the bilateral upper and lower extremities.  Skin:    General:  Skin is warm and dry.     Capillary Refill: Capillary refill takes less than 2 seconds.  Neurological:     Mental Status: She is alert and oriented to person, place, and time.     Cranial Nerves: Cranial nerves 2-12 are intact.     Motor: Motor function is intact.     Coordination: Coordination is intact.  Psychiatric:        Mood and Affect: Mood normal.     ED Results / Procedures / Treatments   Labs (all labs ordered are listed, but only abnormal results are displayed) Labs Reviewed  CBC WITH DIFFERENTIAL/PLATELET -  Abnormal; Notable for the following components:      Result Value   HCT 46.1 (*)    MCV 106.0 (*)    MCH 34.5 (*)    Platelets 118 (*)    All other components within normal limits  COMPREHENSIVE METABOLIC PANEL - Abnormal; Notable for the following components:   Chloride 97 (*)    Glucose, Bld 118 (*)    BUN 40 (*)    Creatinine, Ser 1.25 (*)    Calcium 12.9 (*)    GFR, Estimated 41 (*)    All other components within normal limits  TROPONIN I (HIGH SENSITIVITY) - Abnormal; Notable for the following components:   Troponin I (High Sensitivity) 255 (*)    All other components within normal limits  TROPONIN I (HIGH SENSITIVITY) - Abnormal; Notable for the following components:   Troponin I (High Sensitivity) 302 (*)    All other components within normal limits  RESP PANEL BY RT-PCR (RSV, FLU A&B, COVID)  [id number]  URINALYSIS, ROUTINE W REFLEX MICROSCOPIC    EKG None  Radiology DG Chest 2 View  Result Date: 01/10/2022 CLINICAL DATA:  Weakness EXAM: CHEST - 2 VIEW COMPARISON:  12/23/2021, CT chest 05/06/2015 FINDINGS: Cardiomegaly with mild central congestion. Probable Rosalyn Archambault right effusion. No focal consolidation. Aortic atherosclerosis. No pneumothorax. Electronic device over left lower chest. IMPRESSION: Cardiomegaly with mild central congestion and probable Eliyanna Ault right effusion. Electronically Signed   By: Donavan Foil M.D.   On: 01/10/2022 23:42   MR  LUMBAR SPINE WO CONTRAST  Result Date: 01/10/2022 CLINICAL DATA:  Lumbar radiculopathy EXAM: MRI LUMBAR SPINE WITHOUT CONTRAST TECHNIQUE: Multiplanar, multisequence MR imaging of the lumbar spine was performed. No intravenous contrast was administered. COMPARISON:  01/10/2018 FINDINGS: Segmentation:  Standard. Alignment: Grade 1 retrolisthesis at L3-4 and grade 1 anterolisthesis at L4-5 Vertebrae:  No fracture, evidence of discitis, or bone lesion. Conus medullaris and cauda equina: Conus extends to the L2 level. Conus and cauda equina appear normal. Paraspinal and other soft tissues: Negative Disc levels: L1-L2: Normal disc space and facet joints. No spinal canal stenosis. No neural foraminal stenosis. L2-L3: Nyeli Holtmeyer disc bulge. Progression of moderate spinal canal stenosis. Mild bilateral neural foraminal stenosis. L3-L4: Unchanged Deaundre Allston disc bulge. Left lateral recess narrowing without central spinal canal stenosis. Mild right and moderate left neural foraminal stenosis. L4-L5: Moderate facet arthrosis with disc uncovering and Deborah Dondero central extrusion with superior migration. Mild spinal canal stenosis. Mild right and moderate left neural foraminal stenosis. L5-S1: Regnald Bowens disc bulge and moderate facet hypertrophy. No spinal canal stenosis. Progression of severe right and moderate left neural foraminal stenosis. Visualized sacrum: Normal. IMPRESSION: 1. Progression of moderate L2-3 spinal canal stenosis. 2. Progression of severe right and moderate left L5-S1 neural foraminal stenosis. 3. Unchanged mild spinal canal stenosis and moderate left neural foraminal stenosis at L4-L5. 4. Moderate left L3-4 neural foraminal stenosis. Electronically Signed   By: Ulyses Jarred M.D.   On: 01/10/2022 21:21    Procedures Procedures    Medications Ordered in ED Medications  0.9 %  sodium chloride infusion (has no administration in time range)    ED Course/ Medical Decision Making/ A&P Clinical Course as of 01/10/22  2345  Mon Jan 10, 2022  2241 Creatinine(!): 1.25 [BS]    Clinical Course User Index [BS] Kimmberly Wisser, Si Gaul, PA                           Medical Decision  Making Patient is an 88 year old female, here for reduced appetite, limited p.o. intake for the last 2 weeks after being diagnosed with a stroke, she states that she is does not want to eat or drink, and feels weak lives alone.  Notes that today she started having weakness of both her legs, and felt like she could not walk, so she called her friend, and she attempted to get her up, and she collapsed on the floor.  Did not hit head, no complaints abdomen, neck.  Triage ordered basic labs, and MRI of lumbar spine, she has no neurodeficits on my exam, and has equal strength of bilateral lower extremities.  She denies any chest pain or shortness of breath, however is requiring 3 L of O2 to achieve 95+ percent oxygen.  Will obtain second Trop, head CT given bilateral lower extremity weakness, and chest x-ray.  Amount and/or Complexity of Data Reviewed Labs:  Decision-making details documented in ED Course.    Details: Elevated troponin, uptrending. Radiology: ordered.    Details: MRI shows stenosis lumbar spine ECG/medicine tests:  Decision-making details documented in ED Course. Discussion of management or test interpretation with external provider(s): Discussed with [facility], will need admission pending head CT, chest x-ray.  Patient denies any chest pain, shortness of breath at this time, thus holding on heparin.  Recommend discussing with medicine.  She will need admission secondary to elevated Trope, weakness, possible SNF placement.  Risk Prescription drug management.    Final Clinical Impression(s) / ED Diagnoses Final diagnoses:  Elevated troponin  Weakness    Rx / DC Orders ED Discharge Orders     None         Serafino Burciaga, Si Gaul, PA 01/10/22 2348    Davonna Belling, MD 01/11/22 2316

## 2022-01-10 NOTE — ED Provider Triage Note (Signed)
Emergency Medicine Provider Triage Evaluation Note  Gina [name] , a 88 y.o. female  was evaluated in triage.  Pt complains of BL lower extremity weakness. Unable to ambulate as of today. No Back pain or red flag sxs. No recent GI illness or vaccinations. Decreased PO intake. Recent Stroke, poor fine motor skills left hand  Review of Systems  Positive: Leg weakness  Negative: fever  Physical Exam  BP (!) 129/58 (BP Location: Right Arm)   Pulse 90   Temp 98.1 F (36.7 C) (Oral)   Resp 18   SpO2 93%  Gen:   Awake, no distress   Resp:  Normal effort  MSK:   Moves extremities without difficulty  Other:  Areflexic patellas, 4/5 strength in lower extremities  Medical Decision Making  Medically screening exam initiated at 7:45 PM.  Appropriate orders placed.  [place] was informed that the remainder of the evaluation will be completed by another provider, this initial triage assessment does not replace that evaluation, and the importance of remaining in the ED until their evaluation is complete.     Margarita Mail, PA-C 01/10/22 1949

## 2022-01-11 ENCOUNTER — Observation Stay (HOSPITAL_COMMUNITY): Payer: Medicare Other

## 2022-01-11 ENCOUNTER — Telehealth: Payer: Self-pay

## 2022-01-11 DIAGNOSIS — N1832 Chronic kidney disease, stage 3b: Secondary | ICD-10-CM | POA: Diagnosis present

## 2022-01-11 DIAGNOSIS — R531 Weakness: Secondary | ICD-10-CM | POA: Diagnosis not present

## 2022-01-11 DIAGNOSIS — R079 Chest pain, unspecified: Secondary | ICD-10-CM | POA: Diagnosis not present

## 2022-01-11 DIAGNOSIS — D7589 Other specified diseases of blood and blood-forming organs: Secondary | ICD-10-CM | POA: Diagnosis not present

## 2022-01-11 DIAGNOSIS — I739 Peripheral vascular disease, unspecified: Secondary | ICD-10-CM

## 2022-01-11 DIAGNOSIS — I214 Non-ST elevation (NSTEMI) myocardial infarction: Secondary | ICD-10-CM | POA: Diagnosis not present

## 2022-01-11 DIAGNOSIS — I5032 Chronic diastolic (congestive) heart failure: Secondary | ICD-10-CM | POA: Diagnosis present

## 2022-01-11 DIAGNOSIS — J9611 Chronic respiratory failure with hypoxia: Secondary | ICD-10-CM | POA: Diagnosis not present

## 2022-01-11 DIAGNOSIS — M4800 Spinal stenosis, site unspecified: Secondary | ICD-10-CM | POA: Diagnosis present

## 2022-01-11 DIAGNOSIS — I10 Essential (primary) hypertension: Secondary | ICD-10-CM

## 2022-01-11 DIAGNOSIS — R7989 Other specified abnormal findings of blood chemistry: Secondary | ICD-10-CM | POA: Diagnosis present

## 2022-01-11 DIAGNOSIS — Z8673 Personal history of transient ischemic attack (TIA), and cerebral infarction without residual deficits: Secondary | ICD-10-CM

## 2022-01-11 DIAGNOSIS — R634 Abnormal weight loss: Secondary | ICD-10-CM

## 2022-01-11 DIAGNOSIS — S2242XA Multiple fractures of ribs, left side, initial encounter for closed fracture: Secondary | ICD-10-CM | POA: Diagnosis not present

## 2022-01-11 DIAGNOSIS — D751 Secondary polycythemia: Secondary | ICD-10-CM | POA: Diagnosis not present

## 2022-01-11 LAB — ECHOCARDIOGRAM COMPLETE
AR max vel: 2.04 cm2
AV Area VTI: 2.12 cm2
AV Area mean vel: 1.86 cm2
AV Mean grad: 7 mmHg
AV Peak grad: 11.4 mmHg
Ao pk vel: 1.69 m/s
Area-P 1/2: 9.6 cm2
Height: 60 in
S' Lateral: 3.1 cm
Weight: 2610.25 oz

## 2022-01-11 LAB — URINALYSIS, ROUTINE W REFLEX MICROSCOPIC
Bilirubin Urine: NEGATIVE
Glucose, UA: NEGATIVE mg/dL
Hgb urine dipstick: NEGATIVE
Ketones, ur: NEGATIVE mg/dL
Leukocytes,Ua: NEGATIVE
Nitrite: POSITIVE — AB
Protein, ur: NEGATIVE mg/dL
Specific Gravity, Urine: 1.015 (ref 1.005–1.030)
pH: 6.5 (ref 5.0–8.0)

## 2022-01-11 LAB — BASIC METABOLIC PANEL
Anion gap: 11 (ref 5–15)
BUN: 30 mg/dL — ABNORMAL HIGH (ref 8–23)
CO2: 29 mmol/L (ref 22–32)
Calcium: 11.9 mg/dL — ABNORMAL HIGH (ref 8.9–10.3)
Chloride: 101 mmol/L (ref 98–111)
Creatinine, Ser: 1.01 mg/dL — ABNORMAL HIGH (ref 0.44–1.00)
GFR, Estimated: 54 mL/min — ABNORMAL LOW (ref >60)
Glucose, Bld: 87 mg/dL (ref 70–99)
Potassium: 4.4 mmol/L (ref 3.5–5.1)
Sodium: 141 mmol/L (ref 135–145)

## 2022-01-11 LAB — D-DIMER, QUANTITATIVE: D-Dimer, Quant: >20 ug/mL-FEU — ABNORMAL HIGH (ref 0.00–0.50)

## 2022-01-11 LAB — TROPONIN I (HIGH SENSITIVITY)
Troponin I (High Sensitivity): 230 ng/L (ref <18)
Troponin I (High Sensitivity): 209 ng/L (ref <18)

## 2022-01-11 LAB — URINALYSIS, MICROSCOPIC (REFLEX)

## 2022-01-11 LAB — FOLATE: Folate: >40 ng/mL (ref >5.9)

## 2022-01-11 LAB — SEDIMENTATION RATE: Sed Rate: 8 mm/hr (ref 0–22)

## 2022-01-11 LAB — BRAIN NATRIURETIC PEPTIDE: B Natriuretic Peptide: 59.9 pg/mL (ref 0.0–100.0)

## 2022-01-11 MED ORDER — AMLODIPINE BESYLATE 5 MG PO TABS
5.0000 mg | ORAL_TABLET | Freq: Every day | ORAL | Status: DC
  Administered 2022-01-11 – 2022-01-13 (×3): 5 mg via ORAL
  Filled 2022-01-11 (×3): qty 1

## 2022-01-11 MED ORDER — FLUTICASONE FUROATE-VILANTEROL 100-25 MCG/ACT IN AEPB
1.0000 | INHALATION_SPRAY | Freq: Every day | RESPIRATORY_TRACT | Status: DC
  Administered 2022-01-12 – 2022-01-15 (×4): 1 via RESPIRATORY_TRACT
  Filled 2022-01-11: qty 28

## 2022-01-11 MED ORDER — ALLOPURINOL 100 MG PO TABS
100.0000 mg | ORAL_TABLET | Freq: Every morning | ORAL | Status: DC
  Administered 2022-01-11 – 2022-01-15 (×5): 100 mg via ORAL
  Filled 2022-01-11 (×6): qty 1

## 2022-01-11 MED ORDER — LORATADINE 10 MG PO TABS
10.0000 mg | ORAL_TABLET | Freq: Every day | ORAL | Status: DC
  Administered 2022-01-11 – 2022-01-15 (×5): 10 mg via ORAL
  Filled 2022-01-11 (×5): qty 1

## 2022-01-11 MED ORDER — ACETAMINOPHEN 325 MG PO TABS
650.0000 mg | ORAL_TABLET | ORAL | Status: DC | PRN
  Administered 2022-01-15: 650 mg via ORAL
  Filled 2022-01-11: qty 2

## 2022-01-11 MED ORDER — IOHEXOL 350 MG/ML SOLN
75.0000 mL | Freq: Once | INTRAVENOUS | Status: AC | PRN
  Administered 2022-01-11: 75 mL via INTRAVENOUS

## 2022-01-11 MED ORDER — UMECLIDINIUM BROMIDE 62.5 MCG/ACT IN AEPB
1.0000 | INHALATION_SPRAY | Freq: Every day | RESPIRATORY_TRACT | Status: DC
  Administered 2022-01-12 – 2022-01-15 (×4): 1 via RESPIRATORY_TRACT
  Filled 2022-01-11: qty 7

## 2022-01-11 MED ORDER — ONDANSETRON HCL 4 MG/2ML IJ SOLN: 4.0000 mg | Freq: Four times a day (QID) | INTRAMUSCULAR | Status: DC | PRN

## 2022-01-11 MED ORDER — ENOXAPARIN SODIUM 30 MG/0.3ML IJ SOSY
30.0000 mg | PREFILLED_SYRINGE | INTRAMUSCULAR | Status: DC
  Administered 2022-01-11 – 2022-01-12 (×2): 30 mg via SUBCUTANEOUS
  Filled 2022-01-11 (×2): qty 0.3

## 2022-01-11 MED ORDER — LOSARTAN POTASSIUM 50 MG PO TABS
50.0000 mg | ORAL_TABLET | Freq: Every morning | ORAL | Status: DC
  Administered 2022-01-11 – 2022-01-15 (×5): 50 mg via ORAL
  Filled 2022-01-11 (×5): qty 1

## 2022-01-11 MED ORDER — GABAPENTIN 400 MG PO CAPS
400.0000 mg | ORAL_CAPSULE | Freq: Four times a day (QID) | ORAL | Status: DC
  Administered 2022-01-11 – 2022-01-12 (×4): 400 mg via ORAL
  Filled 2022-01-11 (×4): qty 1

## 2022-01-11 MED ORDER — HYDRALAZINE HCL 20 MG/ML IJ SOLN: 10.0000 mg | INTRAMUSCULAR | Status: DC | PRN

## 2022-01-11 MED ORDER — TICAGRELOR 90 MG PO TABS
90.0000 mg | ORAL_TABLET | Freq: Two times a day (BID) | ORAL | Status: DC
  Administered 2022-01-11 – 2022-01-15 (×9): 90 mg via ORAL
  Filled 2022-01-11 (×9): qty 1

## 2022-01-11 MED ORDER — TRIAMTERENE-HCTZ 37.5-25 MG PO TABS
1.0000 | ORAL_TABLET | Freq: Every morning | ORAL | Status: DC
  Administered 2022-01-11 – 2022-01-12 (×2): 1 via ORAL
  Filled 2022-01-11 (×3): qty 1

## 2022-01-11 MED ORDER — TORSEMIDE 20 MG PO TABS: 20.0000 mg | ORAL_TABLET | Freq: Every morning | ORAL | Status: DC

## 2022-01-11 MED ORDER — ATORVASTATIN CALCIUM 40 MG PO TABS
40.0000 mg | ORAL_TABLET | Freq: Every evening | ORAL | Status: DC
  Administered 2022-01-11 – 2022-01-14 (×4): 40 mg via ORAL
  Filled 2022-01-11 (×4): qty 1

## 2022-01-11 NOTE — ED Provider Notes (Signed)
Assumed care at shift change, see prior note for full H&P.  Briefly, 88 year old female with recent CVA 2 weeks ago with residual left upper extremity deficits, presenting to the ED with difficulty walking.  She describes it as "feeling like her legs had no strength".  Labs obtained from triage with elevated troponin of 255.  Patient without any chest pain but does report DOE.  CBC without any acute findings, no leukocytosis.  No significant electrolyte derangement, creatinine appears around baseline.  MRI of lumbar spine obtained from triage as well, has some progression of canal stenosis but no acute findings.  Delta trop increased to 302.  CXR is clear.  Plan:  CT head pending given recent CVA.  She will require admission.  Has ASA allergy so not given, she is currently on Brilinta.  Results for orders placed or performed during the hospital encounter of 01/10/22  Resp panel by RT-PCR (RSV, Flu A&B, Covid) Anterior Nasal Swab   Specimen: Anterior Nasal Swab  Result Value Ref Range   SARS Coronavirus 2 by RT PCR NEGATIVE NEGATIVE   Influenza A by PCR NEGATIVE NEGATIVE   Influenza B by PCR NEGATIVE NEGATIVE   Resp Syncytial Virus by PCR NEGATIVE NEGATIVE  CBC with Differential  Result Value Ref Range   WBC 9.2 4.0 - 10.5 K/uL   RBC 4.35 3.87 - 5.11 MIL/uL   Hemoglobin 15.0 12.0 - 15.0 g/dL   HCT 46.1 (H) 36.0 - 46.0 %   MCV 106.0 (H) 80.0 - 100.0 fL   MCH 34.5 (H) 26.0 - 34.0 pg   MCHC 32.5 30.0 - 36.0 g/dL   RDW 13.7 11.5 - 15.5 %   Platelets 118 (L) 150 - 400 K/uL   nRBC 0.0 0.0 - 0.2 %   Neutrophils Relative % 77 %   Neutro Abs 7.0 1.7 - 7.7 K/uL   Lymphocytes Relative 11 %   Lymphs Abs 1.0 0.7 - 4.0 K/uL   Monocytes Relative 11 %   Monocytes Absolute 1.0 0.1 - 1.0 K/uL   Eosinophils Relative 1 %   Eosinophils Absolute 0.1 0.0 - 0.5 K/uL   Basophils Relative 0 %   Basophils Absolute 0.0 0.0 - 0.1 K/uL   Immature Granulocytes 0 %   Abs Immature Granulocytes 0.04 0.00 - 0.07 K/uL   Comprehensive metabolic panel  Result Value Ref Range   Sodium 138 135 - 145 mmol/L   Potassium 4.4 3.5 - 5.1 mmol/L   Chloride 97 (L) 98 - 111 mmol/L   CO2 30 22 - 32 mmol/L   Glucose, Bld 118 (H) 70 - 99 mg/dL   BUN 40 (H) 8 - 23 mg/dL   Creatinine, Ser 1.25 (H) 0.44 - 1.00 mg/dL   Calcium 12.9 (H) 8.9 - 10.3 mg/dL   Total Protein 6.9 6.5 - 8.1 g/dL   Albumin 3.8 3.5 - 5.0 g/dL   AST 33 15 - 41 U/L   ALT 25 0 - 44 U/L   Alkaline Phosphatase 55 38 - 126 U/L   Total Bilirubin 1.0 0.3 - 1.2 mg/dL   GFR, Estimated 41 (L) >60 mL/min   Anion gap 11 5 - 15  Troponin I (High Sensitivity)  Result Value Ref Range   Troponin I (High Sensitivity) 255 (HH) <18 ng/L  Troponin I (High Sensitivity)  Result Value Ref Range   Troponin I (High Sensitivity) 302 (HH) <18 ng/L   CT Head Wo Contrast  Result Date: 01/11/2022 CLINICAL DATA:  Weakness and reduced appetite.  EXAM: CT HEAD WITHOUT CONTRAST TECHNIQUE: Contiguous axial images were obtained from the base of the skull through the vertex without intravenous contrast. RADIATION DOSE REDUCTION: This exam was performed according to the departmental dose-optimization program which includes automated exposure control, adjustment of the mA and/or kV according to patient size and/or use of iterative reconstruction technique. COMPARISON:  December 23, 2021 FINDINGS: Brain: There is mild cerebral atrophy with widening of the extra-axial spaces and ventricular dilatation. There are areas of decreased attenuation within the white matter tracts of the supratentorial brain, consistent with microvascular disease changes. Vascular: There is marked severity calcification of the bilateral cavernous carotid arteries. Skull: Normal. Negative for fracture or focal lesion. Sinuses/Orbits: A 9 mm x 9 mm left maxillary sinus polyp versus mucous retention cyst is seen. Other: None. IMPRESSION: 1. No acute intracranial abnormality. 2. Generalized cerebral atrophy with chronic  white matter small vessel ischemic changes. Electronically Signed   By: Virgina Norfolk M.D.   On: 01/11/2022 00:49   DG Chest 2 View  Result Date: 01/10/2022 CLINICAL DATA:  Weakness EXAM: CHEST - 2 VIEW COMPARISON:  12/23/2021, CT chest 05/06/2015 FINDINGS: Cardiomegaly with mild central congestion. Probable small right effusion. No focal consolidation. Aortic atherosclerosis. No pneumothorax. Electronic device over left lower chest. IMPRESSION: Cardiomegaly with mild central congestion and probable small right effusion. Electronically Signed   By: Donavan Foil M.D.   On: 01/10/2022 23:42   MR LUMBAR SPINE WO CONTRAST  Result Date: 01/10/2022 CLINICAL DATA:  Lumbar radiculopathy EXAM: MRI LUMBAR SPINE WITHOUT CONTRAST TECHNIQUE: Multiplanar, multisequence MR imaging of the lumbar spine was performed. No intravenous contrast was administered. COMPARISON:  01/10/2018 FINDINGS: Segmentation:  Standard. Alignment: Grade 1 retrolisthesis at L3-4 and grade 1 anterolisthesis at L4-5 Vertebrae:  No fracture, evidence of discitis, or bone lesion. Conus medullaris and cauda equina: Conus extends to the L2 level. Conus and cauda equina appear normal. Paraspinal and other soft tissues: Negative Disc levels: L1-L2: Normal disc space and facet joints. No spinal canal stenosis. No neural foraminal stenosis. L2-L3: Small disc bulge. Progression of moderate spinal canal stenosis. Mild bilateral neural foraminal stenosis. L3-L4: Unchanged small disc bulge. Left lateral recess narrowing without central spinal canal stenosis. Mild right and moderate left neural foraminal stenosis. L4-L5: Moderate facet arthrosis with disc uncovering and small central extrusion with superior migration. Mild spinal canal stenosis. Mild right and moderate left neural foraminal stenosis. L5-S1: Small disc bulge and moderate facet hypertrophy. No spinal canal stenosis. Progression of severe right and moderate left neural foraminal stenosis.  Visualized sacrum: Normal. IMPRESSION: 1. Progression of moderate L2-3 spinal canal stenosis. 2. Progression of severe right and moderate left L5-S1 neural foraminal stenosis. 3. Unchanged mild spinal canal stenosis and moderate left neural foraminal stenosis at L4-L5. 4. Moderate left L3-4 neural foraminal stenosis. Electronically Signed   By: Ulyses Jarred M.D.   On: 01/10/2022 21:21   VAS US CAROTID  Result Date: 12/27/2021 Carotid Arterial Duplex Study Patient Name:  Gina [name]  Date of Exam:   12/24/2021 Medical Rec #: [id number]          Accession #:    [id number] Date of Birth: [birth year]          Patient Gender: F Patient Age:   82 years Exam Location:  [facility] Procedure:      VAS US CAROTID Referring Phys: Lesleigh Noe --------------------------------------------------------------------------------  Indications:       CVA. Risk Factors:      Hypertension, hyperlipidemia,  prior CVA, PAD. Comparison Study:  no prior Performing Technologist: Archie Patten RVS  Examination Guidelines: A complete evaluation includes B-mode imaging, spectral Doppler, color Doppler, and power Doppler as needed of all accessible portions of each vessel. Bilateral testing is considered an integral part of a complete examination. Limited examinations for reoccurring indications may be performed as noted.  Right Carotid Findings: +----------+--------+--------+--------+------------------+--------+           PSV cm/sEDV cm/sStenosisPlaque DescriptionComments +----------+--------+--------+--------+------------------+--------+ CCA Prox  67      11              heterogenous               +----------+--------+--------+--------+------------------+--------+ CCA Distal50      13              heterogenous               +----------+--------+--------+--------+------------------+--------+ ICA Prox  99      18      1-39%   heterogenous                +----------+--------+--------+--------+------------------+--------+ ICA Distal80      22                                         +----------+--------+--------+--------+------------------+--------+ ECA       117                                                +----------+--------+--------+--------+------------------+--------+ +----------+--------+-------+--------+-------------------+           PSV cm/sEDV cmsDescribeArm Pressure (mmHG) +----------+--------+-------+--------+-------------------+ [id number]                                         +----------+--------+-------+--------+-------------------+ +---------+--------+--+--------+--+---------+ VertebralPSV cm/s64EDV cm/s12Antegrade +---------+--------+--+--------+--+---------+  Left Carotid Findings: +----------+--------+--------+--------+------------------+--------+           PSV cm/sEDV cm/sStenosisPlaque DescriptionComments +----------+--------+--------+--------+------------------+--------+ CCA Prox  83      13              heterogenous               +----------+--------+--------+--------+------------------+--------+ CCA Distal65      15              heterogenous               +----------+--------+--------+--------+------------------+--------+ ICA Prox  65      23      1-39%   heterogenous               +----------+--------+--------+--------+------------------+--------+ ICA Distal51      14                                         +----------+--------+--------+--------+------------------+--------+ ECA       139                                                +----------+--------+--------+--------+------------------+--------+ +----------+--------+--------+--------+-------------------+  PSV cm/sEDV cm/sDescribeArm Pressure (mmHG) +----------+--------+--------+--------+-------------------+ [id number]                                          +----------+--------+--------+--------+-------------------+ +---------+--------+--+--------+--+---------+ VertebralPSV cm/s42EDV cm/s14Antegrade +---------+--------+--+--------+--+---------+   Summary: Right Carotid: Velocities in the right ICA are consistent with a 1-39% stenosis. Left Carotid: Velocities in the left ICA are consistent with a 1-39% stenosis. Vertebrals: Bilateral vertebral arteries demonstrate antegrade flow. *See table(s) above for measurements and observations.  Electronically signed by Antony Contras MD on 12/27/2021 at 8:30:59 AM.    Final    ECHOCARDIOGRAM COMPLETE  Result Date: 12/24/2021    ECHOCARDIOGRAM REPORT   Patient Name:   Gina [name] Date of Exam: 12/24/2021 Medical Rec #:  [id number]         Height:       60.0 in Accession #:    [id number]        Weight:       172.0 lb Date of Birth:  1931-09-05         BSA:          1.751 m Patient Age:    58 years          BP:           162/69 mmHg Patient Gender: F                 HR:           90 bpm. Exam Location:  Inpatient Procedure: 2D Echo, Color Doppler, Cardiac Doppler and Intracardiac            Opacification Agent Indications:    Stroke  History:        Patient has prior history of Echocardiogram examinations, most                 recent 02/23/2018. CHF, TIA, PAD and COPD; Risk                 Factors:Hypertension and Dyslipidemia. Cancer.  Sonographer:    Eartha Inch Referring Phys: [place]  Sonographer Comments: Technically difficult study due to poor echo windows. Image acquisition challenging due to patient body habitus, Image acquisition challenging due to respiratory motion and Image acquisition challenging due to COPD. IMPRESSIONS  1. Left ventricular ejection fraction, by estimation, is 60 to 65%. Left ventricular ejection fraction by PLAX is 62 %. The left ventricle has normal function. The left ventricle has no regional wall motion abnormalities. Left ventricular diastolic parameters are  consistent with Grade I diastolic dysfunction (impaired relaxation). Elevated left ventricular end-diastolic pressure. The E/e' is 59.  2. Right ventricular systolic function is mildly reduced. The right ventricular size is normal. There is normal pulmonary artery systolic pressure. The estimated right ventricular systolic pressure is 29.5 mmHg.  3. The mitral valve is abnormal. Trivial mitral valve regurgitation.  4. The aortic valve is tricuspid. Aortic valve regurgitation is not visualized. Aortic valve sclerosis/calcification is present, without any evidence of aortic stenosis.  5. The inferior vena cava is normal in size with greater than 50% respiratory variability, suggesting right atrial pressure of 3 mmHg. Comparison(s): Changes from prior study are noted. 02/23/2018: LVEF 55-60%, grade 1 DD, trivial pericardial effusion. FINDINGS  Left Ventricle: Left ventricular ejection fraction, by estimation, is 60 to 65%. Left ventricular ejection fraction by PLAX is 62 %. The left ventricle has normal  function. The left ventricle has no regional wall motion abnormalities. Definity contrast agent was given IV to delineate the left ventricular endocardial borders. The left ventricular internal cavity size was normal in size. There is no left ventricular hypertrophy. Left ventricular diastolic parameters are consistent with Grade I diastolic dysfunction (impaired relaxation). Elevated left ventricular end-diastolic pressure. The E/e' is 73. Right Ventricle: The right ventricular size is normal. No increase in right ventricular wall thickness. Right ventricular systolic function is mildly reduced. There is normal pulmonary artery systolic pressure. The tricuspid regurgitant velocity is 2.51 m/s, and with an assumed right atrial pressure of 3 mmHg, the estimated right ventricular systolic pressure is 51.0 mmHg. Left Atrium: Left atrial size was normal in size. Right Atrium: Right atrial size was normal in size.  Pericardium: Trivial pericardial effusion is present. The pericardial effusion is posterior to the left ventricle. Mitral Valve: The mitral valve is abnormal. Mild to moderate mitral annular calcification. Trivial mitral valve regurgitation. MV peak gradient, 8.2 mmHg. The mean mitral valve gradient is 4.0 mmHg. Tricuspid Valve: The tricuspid valve is grossly normal. Tricuspid valve regurgitation is trivial. Aortic Valve: The aortic valve is tricuspid. Aortic valve regurgitation is not visualized. Aortic valve sclerosis/calcification is present, without any evidence of aortic stenosis. Pulmonic Valve: The pulmonic valve was grossly normal. Pulmonic valve regurgitation is trivial. Aorta: The aortic root and ascending aorta are structurally normal, with no evidence of dilitation. Venous: The inferior vena cava is normal in size with greater than 50% respiratory variability, suggesting right atrial pressure of 3 mmHg. IAS/Shunts: No atrial level shunt detected by color flow Doppler.  LEFT VENTRICLE PLAX 2D LV EF:         Left            Diastology                ventricular     LV e' medial:    4.38 cm/s                ejection        LV E/e' medial:  17.9                fraction by     LV e' lateral:   4.03 cm/s                PLAX is 62      LV E/e' lateral: 19.5                %. LVIDd:         4.40 cm LVIDs:         2.95 cm LV PW:         0.70 cm LV IVS:        0.70 cm LVOT diam:     1.90 cm LV SV:         53 LV SV Index:   30 LVOT Area:     2.84 cm  LV Volumes (MOD) LV vol d, MOD    49.4 ml A2C: LV vol d, MOD    88.0 ml A4C: LV vol s, MOD    22.9 ml A2C: LV vol s, MOD    35.3 ml A4C: LV SV MOD A2C:   26.5 ml LV SV MOD A4C:   88.0 ml LV SV MOD BP:    37.9 ml RIGHT VENTRICLE            IVC RV S prime:  9.90 cm/s  IVC diam: 0.70 cm TAPSE (M-mode): 1.2 cm LEFT ATRIUM             Index        RIGHT ATRIUM           Index LA diam:        4.10 cm 2.34 cm/m   RA Area:     17.70 cm LA Vol (A2C):   65.6 ml 37.47 ml/m   RA Volume:   49.00 ml  27.99 ml/m LA Vol (A4C):   45.6 ml 26.05 ml/m LA Biplane Vol: 55.1 ml 31.47 ml/m  AORTIC VALVE             PULMONIC VALVE LVOT Vmax:   96.10 cm/s  PR End Diast Vel: 7.29 msec LVOT Vmean:  62.500 cm/s LVOT VTI:    0.188 m  AORTA Ao Root diam: 2.70 cm Ao Asc diam:  3.60 cm MITRAL VALVE                TRICUSPID VALVE MV Area (PHT): 3.33 cm     TR Peak grad:   25.2 mmHg MV Area VTI:   1.96 cm     TR Vmax:        251.00 cm/s MV Peak grad:  8.2 mmHg MV Mean grad:  4.0 mmHg     SHUNTS MV Vmax:       1.43 m/s     Systemic VTI:  0.19 m MV Vmean:      90.7 cm/s    Systemic Diam: 1.90 cm MV Decel Time: 228 msec MV E velocity: 78.60 cm/s MV A velocity: 129.00 cm/s MV E/A ratio:  0.61 Lyman Bishop MD Electronically signed by Lyman Bishop MD Signature Date/Time: 12/24/2021/12:14:00 PM    Final    MR ANGIO HEAD WO CONTRAST  Result Date: 12/24/2021 CLINICAL DATA:  Left hand weakness and numbness; small acute infarct on MRI EXAM: MRA HEAD WITHOUT CONTRAST TECHNIQUE: Angiographic images of the Circle of Willis were acquired using MRA technique without intravenous contrast. COMPARISON:  MRA head 11/13/2009, correlation is made with CTA head 02/22/2018 FINDINGS: Redemonstrated focus of restricted diffusion with ADC correlate in the right precentral gyrus (series 5, image 88), compatible with acute infarct. Anterior circulation: Both internal carotid arteries are patent to the termini, without significant stenosis. A1 segments patent. Normal anterior communicating artery. Anterior cerebral arteries are patent to their distal aspects. No M1 stenosis or occlusion. Distal MCA branches perfused and symmetric. Posterior circulation: Vertebral arteries patent to the vertebrobasilar junction without stenosis. Posterior inferior cerebral arteries patent bilaterally. Basilar patent to its distal aspect. Superior cerebellar arteries patent bilaterally. Patent P1 segments. PCAs perfused to their distal aspects  without stenosis. The bilateral posterior communicating arteries are not visualized. Anatomic variants: None significant IMPRESSION: No intracranial large vessel occlusion or significant stenosis. Electronically Signed   By: Merilyn Baba M.D.   On: 12/24/2021 03:23   MR CERVICAL SPINE W WO CONTRAST  Result Date: 12/23/2021 CLINICAL DATA:  Left finger weakness EXAM: MRI CERVICAL SPINE WITHOUT AND WITH CONTRAST TECHNIQUE: Multiplanar and multiecho pulse sequences of the cervical spine, to include the craniocervical junction and cervicothoracic junction, were obtained without and with intravenous contrast. CONTRAST:  93mL GADAVIST GADOBUTROL 1 MMOL/ML IV SOLN COMPARISON:  None Available. FINDINGS: Alignment: There is trace anterolisthesis of C3 on C4 and trace retrolisthesis of C4 on C5. Vertebrae: No fracture, evidence of discitis, or bone lesion. Cord: Normal signal and morphology. Posterior Fossa, vertebral  arteries, paraspinal tissues: Negative. Disc levels: C1-C2: Mild degenerative change. C2-C3: Moderate bilateral facet degenerative change. Minimal disc bulge. No spinal canal stenosis. No significant neural foraminal stenosis. C3-C4: Small central disc protrusion that contacts the ventral spinal cord. Severe bilateral facet degenerative change. Ligamentum flavum hypertrophy. Mild spinal canal stenosis. Uncovertebral hypertrophy. Mild bilateral neural foraminal stenosis. C4-C5: Moderate disc space loss. Circumferential disc bulge. Ligamentum flavum hypertrophy. Moderate bilateral facet degenerative change. Uncovertebral hypertrophy. Moderate to severe spinal canal stenosis. Severe bilateral neural foraminal stenosis. C5-C6: Ligamentum flavum hypertrophy. Circumferential disc bulge. Uncovertebral hypertrophy. Moderate bilateral facet degenerative change. Severe spinal canal and bilateral neural foraminal stenosis. C6-C7: Moderate disc space loss. Ligamentum flavum hypertrophy. Mild bilateral facet degenerative  change. Uncovertebral hypertrophy. Severe bilateral neural foraminal stenosis. Severe spinal canal stenosis. C7-T1: Severe bilateral facet degenerative change. Ligamentum flavum hypertrophy. No significant spinal canal or neural foraminal stenosis. IMPRESSION: 1. Midcervical spine predominant degenerative change with severe spinal canal stenosis at C4-C5, C5-C6, and C6-C7, as described above. 2. Severe bilateral neural foraminal stenosis is also noted at C4-C5, C5-C6, and C6-C7. 3. No evidence of cord signal abnormality or abnormal contrast enhancement. Electronically Signed   By: Marin Roberts M.D.   On: 12/23/2021 16:44   MR BRAIN WO CONTRAST  Result Date: 12/23/2021 CLINICAL DATA:  Left finger weakness EXAM: MRI HEAD WITHOUT CONTRAST TECHNIQUE: Multiplanar, multiecho pulse sequences of the brain and surrounding structures were obtained without intravenous contrast. COMPARISON:  None Available. FINDINGS: Brain: Small acute infarct in the precentral gyrus on the right (series 5, image 88).There is sequela of severe chronic microvascular ischemic change. No extra-axial fluid collection. There is a small focus of microhemorrhage in the right cerebellar hemisphere. Vascular: Normal flow voids. Skull and upper cervical spine: No worrisome lesions in the skull. See separately dictated report for cervical spine findings. Sinuses/Orbits: Bilateral lens replacement. Likely a mucous retention cyst in the left maxillary sinus. Mild mucosal thickening bilateral ethmoid sinuses. Other: None IMPRESSION: Small focus of acute infarct in the precentral gyrus on the right. Electronically Signed   By: Marin Roberts M.D.   On: 12/23/2021 16:36   CT HEAD WO CONTRAST  Result Date: 12/23/2021 CLINICAL DATA:  Acute neuro deficit.  Rule out stroke.  Weakness EXAM: CT HEAD WITHOUT CONTRAST TECHNIQUE: Contiguous axial images were obtained from the base of the skull through the vertex without intravenous contrast. RADIATION DOSE  REDUCTION: This exam was performed according to the departmental dose-optimization program which includes automated exposure control, adjustment of the mA and/or kV according to patient size and/or use of iterative reconstruction technique. COMPARISON:  CT head 11/12/2009.  MRI head 02/22/2018 FINDINGS: Brain: Mild atrophy. Negative for hydrocephalus. Moderate to advanced white matter changes with diffuse white matter hypodensity. Negative for acute infarct, hemorrhage, mass Vascular: Negative for hyperdense vessel Skull: Negative Sinuses/Orbits: Mild mucosal edema paranasal sinuses. 5 mm osteoma left ethmoid sinus. Bilateral cataract extraction Other: None IMPRESSION: Atrophy and moderate to advanced chronic microvascular ischemia. No acute abnormality. Electronically Signed   By: Franchot Gallo M.D.   On: 12/23/2021 11:33   DG Chest 2 View  Result Date: 12/23/2021 CLINICAL DATA:  Short of breath EXAM: CHEST - 2 VIEW COMPARISON:  Chest 10/01/2020 FINDINGS: Cardiac enlargement without heart failure. Mild airspace disease in lung bases best seen on the lateral view. Probable atelectasis. No effusion. Lungs show mild hyperinflation. Atherosclerotic calcification aortic arch. IMPRESSION: 1. Cardiac enlargement without heart failure. 2. Mild bibasilar atelectasis. Electronically Signed   By: Franchot Gallo  M.D.   On: 12/23/2021 11:27   DG Abd 2 Views  Result Date: 12/19/2021 CLINICAL DATA:  Abdominal bloating.  History of colon cancer. EXAM: ABDOMEN - 2 VIEW COMPARISON:  None Available. FINDINGS: The bowel gas pattern is normal. There is no evidence of free air. Phleboliths are noted in the pelvis. IMPRESSION: No abnormal bowel dilatation. Aortic Atherosclerosis (ICD10-I70.0). Electronically Signed   By: Marijo Conception M.D.   On: 12/19/2021 07:34    CT head negative.    Discussed with Dr. Marlowe Sax-- will admit.  Requested cards consult-- spoke with overnight fellow, they will see for consultation and provide  recommendations.   Larene Pickett, PA-C 01/11/22 [id number]    Merryl Hacker, MD 01/11/22 [phone]

## 2022-01-11 NOTE — Progress Notes (Signed)
PT Cancellation Note  Patient Details Name: RAGINA FENTER MRN: [id number] DOB: 01-18-1932   Cancelled Treatment:    Reason Eval/Treat Not Completed: Other (comment) Pt declined PT today due to not feeling well. Reports would rather wait till tomorrow.  Will f/u tomorrow as able.  Abran Richard, PT Acute Rehab [facility] [phone]   Karlton Lemon 01/11/2022, 4:33 PM

## 2022-01-11 NOTE — Consult Note (Signed)
Cardiology Consultation   Patient ID: Gina [name] MRN: [id number]; DOB: Jan 26, 1932  Admit date: 01/10/2022 Date of Consult: 01/11/2022  PCP:  Gina Anger, MD   [place] Prov[facility] Cardiologist:  None  Electrophysiologist:  Gina Quitter, MD       Patient Profile:   Gina [name] is a 88 y.o. female with a hx of recent acute CVA (suspected embolic), PAD status post bilateral femoral endarterectomy (2019), hypertension, hyperlipidemia, gout, chronic hypoxic respiratory failure, neuropathy who is being seen 01/11/2022 for the evaluation of elevated troponin at the request of Gina [name].  History of Present Illness:   Gina [name] reports a 1 day history of generalized weakness and inability to stand from a seated position.  She reports that this is unusual for her as she was able to ambulate with her walker in the days prior to this.  She has noticed decreased appetite but otherwise denies chest pain or increased shortness of breath above her baseline.  She denies orthopnea, PND, or increased lower extremity edema.  Her previous admission and echocardiogram showed preserved LV function with mildly reduced RV function and normal RVSP.  A loop recorder was placed to rule out atrial fibrillation as the etiology of her CVA.  She was evaluated in 2021 by Gina [name] for dyspnea on exertion.  At that time she had a nuclear perfusion study that was reportedly normal with normal EF.  In the emergency department her workup was notable for an elevated troponin to 255 followed by 302.  CT head completed showed no acute infarct.  She is being admitted to the hospitalist service for failure to thrive.   Past Medical History:  Diagnosis Date   Allergic rhinitis    seasonal   Arthritis    Colon cancer [facility]) Dec 12   Diverticulosis of colon (without mention of hemorrhage)    GI bleed    had cyst removed from spine....went back on plavix to soon   Gout     Hyperlipidemia    Hyperplastic colon polyp    Hypertension    Macular degeneration    PAD (peripheral artery disease) ([place])    Pneumonia    back in 05/2017   Stroke [facility])    TIA  10/2009   Unspecified transient cerebral ischemia sept '11    Past Surgical History:  Procedure Laterality Date   ABDOMINAL AORTOGRAM N/A 06/16/2017   Procedure: ABDOMINAL AORTOGRAM;  Surgeon: Angelia Mould, MD;  Location: [place] [facility];  Service: Cardiovascular;  Laterality: N/A;   APPENDECTOMY     BACK SURGERY  April '12   benign cyst spine ([place])   BREAST LUMPECTOMY     right breast '86   COLON SURGERY  Dec '12   colectomy for colon cancer ([place])   ENDARTERECTOMY FEMORAL Bilateral 08/02/2017   ENDARTERECTOMY FEMORAL Bilateral 08/02/2017   Procedure: Bilateral Femoral ENDARTERECTOMY with Patch Angioplasty;  Surgeon: Rosetta Posner, MD;  Location: [place];  Service: Vascular;  Laterality: Bilateral;   EYE SURGERY     bilateral cataracts   LOOP RECORDER INSERTION N/A 12/24/2021   Procedure: LOOP RECORDER INSERTION;  Surgeon: Gina Quitter, MD;  Location: [place] [facility];  Service: Cardiovascular;  Laterality: N/A;   LOWER EXTREMITY ANGIOGRAPHY N/A 06/16/2017   Procedure: LOWER EXTREMITY ANGIOGRAPHY;  Surgeon: Angelia Mould, MD;  Location: [place] [facility];  Service: Cardiovascular;  Laterality: N/A;   PERIPHERAL VASCULAR INTERVENTION  06/16/2017   Procedure:  PERIPHERAL VASCULAR INTERVENTION;  Surgeon: Angelia Mould, MD;  Location: [place] [facility];  Service: Cardiovascular;;  left common iliac   TONSILLECTOMY       Home Medications:  Prior to Admission medications   Medication Sig Start Date End Date Taking? Authorizing Provider  acetaminophen (TYLENOL) 500 MG tablet Take 500-1,000 mg by mouth See admin instructions. Take 1,000 mg by mouth in the morning, 500 mg at 4 PM, and 500 mg at bedtime   Yes [provider]  allopurinol (ZYLOPRIM) 100 MG tablet  TAKE 1 TABLET DAILY Patient taking differently: Take 100 mg by mouth in the morning. 09/01/21  Yes Plotnikov, Evie Lacks, MD  amLODipine (NORVASC) 5 MG tablet TAKE 1 TABLET DAILY 09/01/21  Yes Plotnikov, Evie Lacks, MD  atorvastatin (LIPITOR) 40 MG tablet Take 1 tablet (40 mg total) by mouth every evening. 10/01/20  Yes Plotnikov, Evie Lacks, MD  Calcium Carbonate (CALCIUM 600 PO) Take 600 mg by mouth in the morning and at bedtime.   Yes [provider]  cetirizine (ZYRTEC) 10 MG tablet Take 1 tablet (10 mg total) by mouth 2 (two) times daily. Patient taking differently: Take 10 mg by mouth in the morning. 10/01/20  Yes Plotnikov, Evie Lacks, MD  Fluticasone-Umeclidin-Vilant (TRELEGY ELLIPTA) 100-62.5-25 MCG/ACT AEPB USE 1 INHALATION ORALLY    DAILY Patient taking differently: Inhale 1 puff into the lungs daily in the afternoon. 10/20/21  Yes Plotnikov, Evie Lacks, MD  gabapentin (NEURONTIN) 400 MG capsule TAKE 1 CAPSULE 4 TIMES     DAILY Patient taking differently: Take 400 mg by mouth 4 (four) times daily. Take 400 mg by mouth at 4 AM,10 AM, 4 PM, 11 PM 12/09/21  Yes Plotnikov, Evie Lacks, MD  losartan (COZAAR) 50 MG tablet TAKE 1 TABLET DAILY Patient taking differently: Take 50 mg by mouth in the morning. 09/01/21  Yes Plotnikov, Evie Lacks, MD  Multiple Vitamin (MULTIVITAMIN WITH MINERALS) TABS tablet Take 1 tablet by mouth daily with breakfast.   Yes [provider]  Multiple Vitamins-Minerals (PRESERVISION AREDS 2 PO) Take 1 capsule by mouth in the morning.   Yes [provider]  mupirocin ointment (BACTROBAN) 2 % Apply 1 Application topically daily as needed (for irritation- affected areas). 08/20/21  Yes [provider]  OXYGEN Inhale 2 L/min into the lungs at bedtime.   Yes [provider]  ticagrelor (BRILINTA) 90 MG TABS tablet Take 1 tablet (90 mg total) by mouth 2 (two) times daily. 12/25/21  Yes Samuella Cota, MD  torsemide (DEMADEX) 20 MG tablet Take  1-2 tablets (20-40 mg total) by mouth daily. Patient taking differently: Take 20-40 mg by mouth in the morning. 09/02/21  Yes Plotnikov, Evie Lacks, MD  triamterene-hydrochlorothiazide (MAXZIDE-25) 37.5-25 MG tablet TAKE 1 TABLET DAILY Patient taking differently: Take 1 tablet by mouth in the morning. 09/01/21  Yes Plotnikov, Evie Lacks, MD  vitamin C (ASCORBIC ACID) 500 MG tablet Take 500 mg by mouth every evening.   Yes [provider]  metroNIDAZOLE (FLAGYL) 500 MG tablet Take 1 tablet (500 mg total) by mouth 3 (three) times daily. Patient not taking: Reported on 01/10/2022 12/16/21   Plotnikov, Evie Lacks, MD  sodium chloride (OCEAN) 0.65 % SOLN nasal spray Place 1 spray into both nostrils as needed for congestion.    [provider]    Inpatient Medications: Scheduled Meds:  Continuous Infusions:  PRN Meds:   Allergies:    Allergies  Allergen Reactions   Penicillins Hives  Has patient had a PCN reaction causing immediate rash, facial/tongue/throat swelling, SOB or lightheadedness with hypotension:## yes ## Has patient had a PCN reaction causing severe rash involving mucus membranes or skin necrosis: no Has patient had a PCN reaction that required hospitalization: no Has patient had a PCN reaction occurring within the last 10 years: no If all of the above answers are "NO", then may proceed with Cephalosporin use.    Aspirin Hives   Enalapril Other (See Comments) and Cough    Wheezing, also   Flonase [Fluticasone] Other (See Comments)    Nose bleeds   Adhesive [Tape] Other (See Comments)    "pulls skin off"    Social History:   Social History   Socioeconomic History   Marital status: Widowed    Spouse name: Not on file   Number of children: 3   Years of education: 14   Highest education level: Not on file  Occupational History   Occupation: Tree surgeon: RETIRED    Comment: retired  Tobacco Use   Smoking status: Former    Packs/day: 1.50     Years: 30.00    Total pack years: 45.00    Types: Cigarettes    Quit date: 07/21/1987    Years since quitting: 34.5   Smokeless tobacco: Never  Vaping Use   Vaping Use: Never used  Substance and Sexual Activity   Alcohol use: Yes    Alcohol/week: 14.0 standard drinks of alcohol    Types: 14 Glasses of wine per week    Comment: Wine daily   Drug use: No   Sexual activity: Not Currently  Other Topics Concern   Not on file  Social History Narrative   HSG, Married 1955 widowed 2010. 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 Passed away 07-10-08 Her son  moved to [place] (July '10). End of life care:  no extra ordinary measures: wants cardiac resusitation and mechanical ventilation short-term if needed. Does not want to be kept in a persistant vegative state or long term artifical life support.    Social Determinants of Health   Financial Resource Strain: Low Risk  (07/19/2021)   Overall Financial Resource Strain (CARDIA)    Difficulty of Paying Living Expenses: Not hard at all  Food Insecurity: No Food Insecurity (07/19/2021)   Hunger Vital Sign    Worried About Running Out of Food in the Last Year: Never true    Ran Out of Food in the Last Year: Never true  Transportation Needs: No Transportation Needs (07/19/2021)   PRAPARE - Hydrologist (Medical): No    Lack of Transportation (Non-Medical): No  Physical Activity: Inactive (07/19/2021)   Exercise Vital Sign    Days of Exercise per Week: 0 days    Minutes of Exercise per Session: 0 min  Stress: No Stress Concern Present (07/19/2021)   [place]    Feeling of Stress : Not at all  Social Connections: Moderately Integrated (07/19/2021)   Social Connection and Isolation Panel  [NHANES]    Frequency of Communication with Friends and Family: Three times a week    Frequency of Social Gatherings with Friends and Family: Three times a week    Attends Religious Services: More than 4 times per year  Active Member of Clubs or Organizations: Yes    Attends Archivist Meetings: More than 4 times per year    Marital Status: Widowed  Intimate Partner Violence: Not At Risk (07/19/2021)   Humiliation, Afraid, Rape, and Kick questionnaire    Fear of Current or Ex-Partner: No    Emotionally Abused: No    Physically Abused: No    Sexually Abused: No    Family History:    Family History  Problem Relation Age of Onset   Colon cancer Father    Hypertension Father    Hyperlipidemia Father    Coronary artery disease Father    Ulcers Father    Heart failure Mother    Coronary artery disease Mother    Breast cancer Mother    Hypertension Mother    Hyperlipidemia Mother    Asthma Mother    Diabetes Neg Hx      ROS:  Please see the history of present illness.   All other ROS reviewed and negative.     Physical Exam/Data:   Vitals:   01/11/22 0011 01/11/22 0020 01/11/22 0307 01/11/22 0530  BP:  (!) 139/58 (!) 147/62 (!) 150/67  Pulse:  83 85 93  Resp:  20 18 (!) 24  Temp: 98.5 F (36.9 C)   98.5 F (36.9 C)  TempSrc: Oral   Oral  SpO2:  99% 92% 92%  Weight:      Height:        Intake/Output Summary (Last 24 hours) at 01/11/2022 0655 Last data filed at 01/11/2022 0257 Gross per 24 hour  Intake 250 ml  Output --  Net 250 ml      01/10/2022   11:12 PM 12/30/2021   10:49 AM 12/23/2021   10:55 AM  Last 3 Weights  Weight (lbs) 163 lb 2.3 oz 167 lb 172 lb  Weight (kg) 74 kg 75.751 kg 78.019 kg     Body mass index is 31.86 kg/m.  General:  Elderly female in no acute distress HEENT: normal Neck: no JVD Vascular: No carotid bruits; Distal pulses 2+ bilaterally Cardiac:  normal S1, S2; RRR; no murmur  Lungs:  mildly decreased breath sounds  bilaterally Abd: soft, nontender, no hepatomegaly  Ext: R>L edema Musculoskeletal:  No deformities, BUE and BLE strength normal and equal Skin: warm and dry  Neuro:  CNs 2-12 intact, no focal abnormalities noted Psych:  Normal affect   EKG:  The EKG was personally reviewed and demonstrates:  Normal sinus rhythm with right axis deviation Telemetry:  Telemetry was personally reviewed and demonstrates:  n/a  Relevant CV Studies: Echo 12/2021  1. Left ventricular ejection fraction, by estimation, is 60 to 65%. Left  ventricular ejection fraction by PLAX is 62 %. The left ventricle has  normal function. The left ventricle has no regional wall motion  abnormalities. Left ventricular diastolic  parameters are consistent with Grade I diastolic dysfunction (impaired  relaxation). Elevated left ventricular end-diastolic pressure. The E/e' is  109.   2. Right ventricular systolic function is mildly reduced. The right  ventricular size is normal. There is normal pulmonary artery systolic  pressure. The estimated right ventricular systolic pressure is 59.9 mmHg.   3. The mitral valve is abnormal. Trivial mitral valve regurgitation.   4. The aortic valve is tricuspid. Aortic valve regurgitation is not  visualized. Aortic valve sclerosis/calcification is present, without any  evidence of aortic stenosis.   5. The inferior vena cava is normal in size with greater  than 50%  respiratory variability, suggesting right atrial pressure of 3 mmHg.   Laboratory Data:  High Sensitivity Troponin:   Recent Labs  Lab 01/10/22 1953 01/10/22 2143  TROPONINIHS 255* 302*     Chemistry Recent Labs  Lab 01/10/22 1953  NA 138  K 4.4  CL 97*  CO2 30  GLUCOSE 118*  BUN 40*  CREATININE 1.25*  CALCIUM 12.9*  GFRNONAA 41*  ANIONGAP 11    Recent Labs  Lab 01/10/22 1953  PROT 6.9  ALBUMIN 3.8  AST 33  ALT 25  ALKPHOS 55  BILITOT 1.0   Lipids No results for input(s): "CHOL", "TRIG", "HDL",  "LABVLDL", "LDLCALC", "CHOLHDL" in the last 168 hours.  Hematology Recent Labs  Lab 01/10/22 1953  WBC 9.2  RBC 4.35  HGB 15.0  HCT 46.1*  MCV 106.0*  MCH 34.5*  MCHC 32.5  RDW 13.7  PLT 118*   Thyroid No results for input(s): "TSH", "FREET4" in the last 168 hours.  BNPNo results for input(s): "BNP", "PROBNP" in the last 168 hours.  DDimer No results for input(s): "DDIMER" in the last 168 hours.   Radiology/Studies:  CT Head Wo Contrast  Result Date: 01/11/2022 CLINICAL DATA:  Weakness and reduced appetite. EXAM: CT HEAD WITHOUT CONTRAST TECHNIQUE: Contiguous axial images were obtained from the base of the skull through the vertex without intravenous contrast. RADIATION DOSE REDUCTION: This exam was performed according to the departmental dose-optimization program which includes automated exposure control, adjustment of the mA and/or kV according to patient size and/or use of iterative reconstruction technique. COMPARISON:  December 23, 2021 FINDINGS: Brain: There is mild cerebral atrophy with widening of the extra-axial spaces and ventricular dilatation. There are areas of decreased attenuation within the white matter tracts of the supratentorial brain, consistent with microvascular disease changes. Vascular: There is marked severity calcification of the bilateral cavernous carotid arteries. Skull: Normal. Negative for fracture or focal lesion. Sinuses/Orbits: A 9 mm x 9 mm left maxillary sinus polyp versus mucous retention cyst is seen. Other: None. IMPRESSION: 1. No acute intracranial abnormality. 2. Generalized cerebral atrophy with chronic white matter small vessel ischemic changes. Electronically Signed   By: Virgina Norfolk M.D.   On: 01/11/2022 00:49   DG Chest 2 View  Result Date: 01/10/2022 CLINICAL DATA:  Weakness EXAM: CHEST - 2 VIEW COMPARISON:  12/23/2021, CT chest 05/06/2015 FINDINGS: Cardiomegaly with mild central congestion. Probable small right effusion. No focal  consolidation. Aortic atherosclerosis. No pneumothorax. Electronic device over left lower chest. IMPRESSION: Cardiomegaly with mild central congestion and probable small right effusion. Electronically Signed   By: Donavan Foil M.D.   On: 01/10/2022 23:42   MR LUMBAR SPINE WO CONTRAST  Result Date: 01/10/2022 CLINICAL DATA:  Lumbar radiculopathy EXAM: MRI LUMBAR SPINE WITHOUT CONTRAST TECHNIQUE: Multiplanar, multisequence MR imaging of the lumbar spine was performed. No intravenous contrast was administered. COMPARISON:  01/10/2018 FINDINGS: Segmentation:  Standard. Alignment: Grade 1 retrolisthesis at L3-4 and grade 1 anterolisthesis at L4-5 Vertebrae:  No fracture, evidence of discitis, or bone lesion. Conus medullaris and cauda equina: Conus extends to the L2 level. Conus and cauda equina appear normal. Paraspinal and other soft tissues: Negative Disc levels: L1-L2: Normal disc space and facet joints. No spinal canal stenosis. No neural foraminal stenosis. L2-L3: Small disc bulge. Progression of moderate spinal canal stenosis. Mild bilateral neural foraminal stenosis. L3-L4: Unchanged small disc bulge. Left lateral recess narrowing without central spinal canal stenosis. Mild right and moderate left neural foraminal stenosis.  L4-L5: Moderate facet arthrosis with disc uncovering and small central extrusion with superior migration. Mild spinal canal stenosis. Mild right and moderate left neural foraminal stenosis. L5-S1: Small disc bulge and moderate facet hypertrophy. No spinal canal stenosis. Progression of severe right and moderate left neural foraminal stenosis. Visualized sacrum: Normal. IMPRESSION: 1. Progression of moderate L2-3 spinal canal stenosis. 2. Progression of severe right and moderate left L5-S1 neural foraminal stenosis. 3. Unchanged mild spinal canal stenosis and moderate left neural foraminal stenosis at L4-L5. 4. Moderate left L3-4 neural foraminal stenosis. Electronically Signed   By:  Ulyses Jarred M.D.   On: 01/10/2022 21:21     Assessment and Plan:   NSTEMI type II HTN HLD PAD s/p endarterectomy Chronic hypoxemic respiratory failure CKD 3B  Mrs. Casalino is an 88 year old female with cardiac risk factors of hypertension and hyperlipidemia who presents to the ED today for symptoms of generalized weakness and failure to thrive.  Notably she denies any chest pain or heart failure symptoms.  Her workup is significant for a mildly elevated troponin with a rise and fall with the peak level of 302.  Her ECG is reassuring in that it shows normal sinus rhythm without significant ischemic changes.  Given the clinical picture of failure to thrive with an absence of chest pain suspect that the elevation in troponin is likely demand ischemia related to her failure to thrive.  Recommendations: - Echocardiogram - Continue antiplatelet per neurology recommendations at last discharge -Would not recommend heparin therapy at this time  Risk Assessment/Risk Scores:     TIMI Risk Score for Unstable Angina or Non-ST Elevation MI:   The patient's TIMI risk score is 3, which indicates a 13% risk of all cause mortality, new or recurrent myocardial infarction or need for urgent revascularization in the next 14 days.          For questions or updates, please contact [place] Please consult [email or web address] for contact info under    Signed, Loren Racer, MD  01/11/2022 6:55 AM

## 2022-01-11 NOTE — H&P (Signed)
History and Physical    Patient: Gina [name] HUD:[id number] DOB: 12-09-1931 DOA: 01/10/2022 DOS: the patient was seen and examined on 01/11/2022 PCP: Plotnikov, Evie Lacks, MD  Patient coming from: Home via EMS  Chief Complaint:  Chief Complaint  Patient presents with   Failure To Thrive   HPI: Gina [name] is a 88 y.o. female with medical history significant of hypertension, hyperlipidemia, CVA, PAD, colon polyps, prior GI bleed, and remote tobacco abuse  presents with complaints of weakness and worsening tremors.  She just recently been hospitalized 11/9-11/10 after presenting for left hand weakness found to have a small right precentral gyrus infarct.  Likely embolic in nature from cryptogenic source.  She had been switched from Plavix to Brilinta 90 mg twice daily.  Patient had loop recorder placed on 11/10 to evaluate for the possibility of atrial fibrillation.  Since getting home patient had continued to have difficulty using her left hand.  Notes also having difficulty standing when sitting in a chair she reports associated symptoms of decreased appetite, shortness of breath with exertion, generalized fatigue, chronic leg swelling worse on the right, and reports of 30 pound weight loss the last couple months.  Denies any confusion or stomach pain.  She is on a calcium supplement which she takes twice a day and had a multivitamin, but states she has been on this regimen for quite some time.  She reports a 35-smoking pack-year history of tobacco use, but quit back in 1989.  Patient reports prior history of colon cancer, but last colonoscopy back in 2018 was last recommended where she had a 4 mm polyp removed from the sigmoid colon and no  repeat recommended due to her age.  In route with EMS patient sats are noted to be percent on arrival with improvement on 3 L of nasal cannula oxygen.  In the ED patient was noted to be afebrile with tachypnea and O2 saturations currently maintained  on 3 L of nasal cannula oxygen.  Labs significant for platelets 118, BUN 40, creatinine 1.25, calcium 12.9, high-sensitivity troponins 255-> 302->230.  Influenza and COVID-19 screening were negative.  MRI of the lumbar spine noted progression of moderate L2-3 spinal canal stenosis with progression of severe right and moderate left L5-S1 neural foraminal stenosis, unchanged stenosis of L4-L5, and moderate L3-L4 neuroforaminal stenosis. Chest x-ray noted cardiomegaly with mild central congestion and probable small right pleural effusion.  CT scan of the head did not note any acute abnormality.    Review of Systems: As mentioned in the history of present illness. All other systems reviewed and are negative. Past Medical History:  Diagnosis Date   Allergic rhinitis    seasonal   Arthritis    Colon cancer [facility]) Dec 12   Diverticulosis of colon (without mention of hemorrhage)    GI bleed    had cyst removed from spine....went back on plavix to soon   Gout    Hyperlipidemia    Hyperplastic colon polyp    Hypertension    Macular degeneration    PAD (peripheral artery disease) ([place])    Pneumonia    back in 05/2017   Stroke [facility])    TIA  10/2009   Unspecified transient cerebral ischemia sept '11   Past Surgical History:  Procedure Laterality Date   ABDOMINAL AORTOGRAM N/A 06/16/2017   Procedure: ABDOMINAL AORTOGRAM;  Surgeon: Angelia Mould, MD;  Location: [place] CV LAB;  Service: Cardiovascular;  Laterality: N/A;   APPENDECTOMY  BACK SURGERY  April '12   benign cyst spine [facility])   BREAST LUMPECTOMY     right breast '86   COLON SURGERY  Dec '12   colectomy for colon cancer ([place])   ENDARTERECTOMY FEMORAL Bilateral 08/02/2017   ENDARTERECTOMY FEMORAL Bilateral 08/02/2017   Procedure: Bilateral Femoral ENDARTERECTOMY with Patch Angioplasty;  Surgeon: Rosetta Posner, MD;  Location: [place];  Service: Vascular;  Laterality: Bilateral;   EYE SURGERY     bilateral cataracts    LOOP RECORDER INSERTION N/A 12/24/2021   Procedure: LOOP RECORDER INSERTION;  Surgeon: Melida Quitter, MD;  Location: [place] [facility];  Service: Cardiovascular;  Laterality: N/A;   LOWER EXTREMITY ANGIOGRAPHY N/A 06/16/2017   Procedure: LOWER EXTREMITY ANGIOGRAPHY;  Surgeon: Angelia Mould, MD;  Location: [place] CV LAB;  Service: Cardiovascular;  Laterality: N/A;   PERIPHERAL VASCULAR INTERVENTION  06/16/2017   Procedure: PERIPHERAL VASCULAR INTERVENTION;  Surgeon: Angelia Mould, MD;  Location: [place] [facility];  Service: Cardiovascular;;  left common iliac   TONSILLECTOMY     Social History:  reports that she quit smoking about 34 years ago. Her smoking use included cigarettes. She has a 45.00 pack-year smoking history. She has never used smokeless tobacco. She reports current alcohol use of about 14.0 standard drinks of alcohol per week. She reports that she does not use drugs.  Allergies  Allergen Reactions   Penicillins Hives    Has patient had a PCN reaction causing immediate rash, facial/tongue/throat swelling, SOB or lightheadedness with hypotension:## yes ## Has patient had a PCN reaction causing severe rash involving mucus membranes or skin necrosis: no Has patient had a PCN reaction that required hospitalization: no Has patient had a PCN reaction occurring within the last 10 years: no If all of the above answers are "NO", then may proceed with Cephalosporin use.    Aspirin Hives   Enalapril Other (See Comments) and Cough    Wheezing, also   Flonase [Fluticasone] Other (See Comments)    Nose bleeds   Adhesive [Tape] Other (See Comments)    "pulls skin off"    Family History  Problem Relation Age of Onset   Colon cancer Father    Hypertension Father    Hyperlipidemia Father    Coronary artery disease Father    Ulcers Father    Heart failure Mother    Coronary artery disease Mother    Breast cancer Mother    Hypertension Mother     Hyperlipidemia Mother    Asthma Mother    Diabetes Neg Hx     Prior to Admission medications   Medication Sig Start Date End Date Taking? Authorizing Provider  acetaminophen (TYLENOL) 500 MG tablet Take 500-1,000 mg by mouth See admin instructions. Take 1,000 mg by mouth in the morning, 500 mg at 4 PM, and 500 mg at bedtime   Yes [provider]  allopurinol (ZYLOPRIM) 100 MG tablet TAKE 1 TABLET DAILY Patient taking differently: Take 100 mg by mouth in the morning. 09/01/21  Yes Plotnikov, Evie Lacks, MD  amLODipine (NORVASC) 5 MG tablet TAKE 1 TABLET DAILY 09/01/21  Yes Plotnikov, Evie Lacks, MD  atorvastatin (LIPITOR) 40 MG tablet Take 1 tablet (40 mg total) by mouth every evening. 10/01/20  Yes Plotnikov, Evie Lacks, MD  Calcium Carbonate (CALCIUM 600 PO) Take 600 mg by mouth in the morning and at bedtime.   Yes [provider]  cetirizine (ZYRTEC) 10 MG tablet Take 1 tablet (10  mg total) by mouth 2 (two) times daily. Patient taking differently: Take 10 mg by mouth in the morning. 10/01/20  Yes Plotnikov, Evie Lacks, MD  Fluticasone-Umeclidin-Vilant (TRELEGY ELLIPTA) 100-62.5-25 MCG/ACT AEPB USE 1 INHALATION ORALLY    DAILY Patient taking differently: Inhale 1 puff into the lungs daily in the afternoon. 10/20/21  Yes Plotnikov, Evie Lacks, MD  gabapentin (NEURONTIN) 400 MG capsule TAKE 1 CAPSULE 4 TIMES     DAILY Patient taking differently: Take 400 mg by mouth 4 (four) times daily. Take 400 mg by mouth at 4 AM,10 AM, 4 PM, 11 PM 12/09/21  Yes Plotnikov, Evie Lacks, MD  losartan (COZAAR) 50 MG tablet TAKE 1 TABLET DAILY Patient taking differently: Take 50 mg by mouth in the morning. 09/01/21  Yes Plotnikov, Evie Lacks, MD  Multiple Vitamin (MULTIVITAMIN WITH MINERALS) TABS tablet Take 1 tablet by mouth daily with breakfast.   Yes [provider]  Multiple Vitamins-Minerals (PRESERVISION AREDS 2 PO) Take 1 capsule by mouth in the morning.   Yes [provider]   mupirocin ointment (BACTROBAN) 2 % Apply 1 Application topically daily as needed (for irritation- affected areas). 08/20/21  Yes [provider]  OXYGEN Inhale 2 L/min into the lungs at bedtime.   Yes [provider]  ticagrelor (BRILINTA) 90 MG TABS tablet Take 1 tablet (90 mg total) by mouth 2 (two) times daily. 12/25/21  Yes Samuella Cota, MD  torsemide (DEMADEX) 20 MG tablet Take 1-2 tablets (20-40 mg total) by mouth daily. Patient taking differently: Take 20-40 mg by mouth in the morning. 09/02/21  Yes Plotnikov, Evie Lacks, MD  triamterene-hydrochlorothiazide (MAXZIDE-25) 37.5-25 MG tablet TAKE 1 TABLET DAILY Patient taking differently: Take 1 tablet by mouth in the morning. 09/01/21  Yes Plotnikov, Evie Lacks, MD  vitamin C (ASCORBIC ACID) 500 MG tablet Take 500 mg by mouth every evening.   Yes [provider]  metroNIDAZOLE (FLAGYL) 500 MG tablet Take 1 tablet (500 mg total) by mouth 3 (three) times daily. Patient not taking: Reported on 01/10/2022 12/16/21   Plotnikov, Evie Lacks, MD  sodium chloride (OCEAN) 0.65 % SOLN nasal spray Place 1 spray into both nostrils as needed for congestion.    [provider]    Physical Exam: Vitals:   01/11/22 0011 01/11/22 0020 01/11/22 0307 01/11/22 0530  BP:  (!) 139/58 (!) 147/62 (!) 150/67  Pulse:  83 85 93  Resp:  20 18 (!) 24  Temp: 98.5 F (36.9 C)   98.5 F (36.9 C)  TempSrc: Oral   Oral  SpO2:  99% 92% 92%  Weight:      Height:        Constitutional: Elderly female who appears to be in no acute distress Eyes: PERRL, lids and conjunctivae normal ENMT: Mucous membranes are moist. Posterior pharynx clear of any exudate or lesions.  Neck: normal, supple, no masses, no thyromegaly Respiratory: Mildly decreased overall aeration with out significant wheezes or crackles appreciated at this time. Cardiovascular: Regular rate and rhythm, no murmurs / rubs / gallops.  +1 pitting bilateral lower extremity  edema more noticeable on the right leg than on the left. abdomen: no tenderness, no masses palpated. No hepatosplenomegaly. Bowel sounds positive.  Musculoskeletal: no clubbing / cyanosis. No joint deformity upper and lower extremities. Good ROM, no contractures. Normal muscle tone.  Skin: no rashes, lesions, ulcers. No induration Neurologic: CN 2-12 grossly intact. Sensation intact, DTR normal. Strength 5/5 in all 4.  Psychiatric: Normal judgment  and insight. Alert and oriented x 3. Normal mood.   Data Reviewed:  EKG reveals sinus rhythm at 94 bpm with right axis deviation and no significant ischemic changes appreciated..  Assessment and Plan:  NSTEMI Acute.  Patient presents with complaints of dyspnea on exertion.  High-sensitivity troponins 255->302->230.  EKG without significant ischemic changes.  Echocardiogram from 11/10 after stroke noted EF to be 60 to 65% with grade 1 diastolic dysfunction and normal pulmonary artery systolic pressure.  Cardiology consulted and appear to think symptoms likely secondary to demand. -Admit to a cardiac telemetry bed -Checking echocardiogram -Recheck cardiology consultative services we will follow-up for any further recommendations  Chronic respiratory failure with hypoxia COPD Patient reported complaints of progressively worsening shortness of breath.  Normally on 3 L of oxygen at night, but noted to have O2 saturations as low as 89% and was placed on 3 L with improvement in O2 saturations.  No significant wheezing appreciated on physical exam or overt signs of fluid overload.   -Continuous pulse oximetry with nasal cannula oxygen to maintain O2 saturations greater than 90 -Check D-dimer -Continue pharmacy substitution of trilogy  -Albuterol nebs breathing treatments as needed  Hypercalcemia Acute on chronic.  Calcium level elevated up to 12.9 yesterday, but review of records noted calcium levels have been rising since last month.  Patient is on  calcium carbonate 600 mg twice daily and 2 multivitamins.  She had been given 250 mL of IV fluids.  Question if hypercalcemia from supplements versus possibility of underlying malignancy -Hold calcium supplements -Recheck calcium levels and check ESR -May warrant checking PTH and PTH-r  History of CVA with residual deficit bilateral lower extremity weakness Patient just recently suffered a CVA on 11/9 of the right precentral gyrus and reports continued left hand weakness..  Now complaining of weakness of lower extremities.  CT scan of the head negative for any acute abnormality. -Continue Brilinta and atorvastatin -Orders placed to interrogate loop recorder -PT/OT to evaluate and treat -May warrant further investigation if symptoms persist  Diastolic congestive heart failure Chronic.  Patient did not appear to be acutely fluid overloaded.  BNP was 59.9. Chest x-ray noted cardiomegaly with mild central congestion and probable small right effusion.   -Strict I&Os and daily weights -Follow-up echocardiogram  Spinal stenosis neuropathy Patient had recently been seen to have severe spinal stenosis in the cervical spine and was noted to have progression of spinal stenosis on MRI of the lumbar spine as noted on imaging. -Continue gabapentin  Essential hypertension Blood pressures initially noted to be elevated up to 150/67. -Resume home regimen -Hydralazine IV as needed for elevated blood pressures -Consider adjusting regimen as warranted  Chronic kidney disease stage IIIa Patient presents with creatinine of 1.25 with BUN 40.  Baseline creatinine previously been around 1.1.  This is not greater than a 0.3 increased from baseline to suggest acute kidney injury. -Continue to monitor kidney function  Polycythemia macrocytosis without anemia Chronic.  H&H 15 and 46.1 which appears similar to priors.  MCV and MCH elevated. -Check vitamin B12 and folate levels  Weight loss Patient reported  weight loss of approximately 30 pounds over the last couple months it seems.  Thyroid studies were normal on 11/16/2021. -continue to monitor  PAD -Continue Plavix and statin  Gout -Continue allopurinol   DVT prophylaxis: Lovenox Advance Care Planning:   Code Status: Full Code patient's son is POA.  Consults: Cardiology  Family Communication: Son and granddaughter updated at bedside  Severity  of Illness: The appropriate patient status for this patient is OBSERVATION. Observation status is judged to be reasonable and necessary in order to provide the required intensity of service to ensure the patient's safety. The patient's presenting symptoms, physical exam findings, and initial radiographic and laboratory data in the context of their medical condition is felt to place them at decreased risk for further clinical deterioration. Furthermore, it is anticipated that the patient will be medically stable for discharge from the hospital within 2 midnights of admission.   Author: Norval Morton, MD 01/11/2022 7:16 AM  For on call review [email or web address].

## 2022-01-11 NOTE — Progress Notes (Signed)
Rounding Note    Patient Name: Gina [name] Date of Encounter: 01/11/2022  [place] Cardiologist: Mertie Moores, MD new EP MD: Melida Quitter, MD   Subjective   Feels a little better today  Inpatient Medications    Scheduled Meds:  allopurinol  100 mg Oral q AM   amLODipine  5 mg Oral Daily   atorvastatin  40 mg Oral QPM   enoxaparin (LOVENOX) injection  30 mg Subcutaneous Q24H   fluticasone furoate-vilanterol  1 puff Inhalation Daily   And   umeclidinium bromide  1 puff Inhalation Daily   gabapentin  400 mg Oral QID   loratadine  10 mg Oral Daily   losartan  50 mg Oral q AM   ticagrelor  90 mg Oral BID   [START ON 01/12/2022] torsemide  20 mg Oral q AM   triamterene-hydrochlorothiazide  1 tablet Oral q AM   Continuous Infusions:  PRN Meds: acetaminophen, hydrALAZINE, ondansetron (ZOFRAN) IV   Vital Signs    Vitals:   01/11/22 0900 01/11/22 0915 01/11/22 0930 01/11/22 1000  BP: 136/61 137/60 (!) 151/65 (!) 151/67  Pulse: 89 89 98 89  Resp: 19 (!) 21 11 (!) 23  Temp:      TempSrc:      SpO2: 93% 93% 90% 93%  Weight:      Height:        Intake/Output Summary (Last 24 hours) at 01/11/2022 1149 Last data filed at 01/11/2022 0257 Gross per 24 hour  Intake 250 ml  Output --  Net 250 ml      01/10/2022   11:12 PM 12/30/2021   10:49 AM 12/23/2021   10:55 AM  Last 3 Weights  Weight (lbs) 163 lb 2.3 oz 167 lb 172 lb  Weight (kg) 74 kg 75.751 kg 78.019 kg      Telemetry    SR - Personally Reviewed  ECG    None today - Personally Reviewed  Physical Exam   GEN: No acute distress.   Neck: No JVD Cardiac: RRR, no murmurs, rubs, or gallops.  Respiratory: Clear to auscultation bilaterally. GI: Soft, nontender, non-distended  MS: No edema; No deformity. Neuro:  Nonfocal  Psych: Normal affect   Labs    High Sensitivity Troponin:   Recent Labs  Lab 01/10/22 1953 01/10/22 2143 01/11/22 0546 01/11/22 0812  TROPONINIHS  255* 302* 230* 209*     Chemistry Recent Labs  Lab 01/10/22 1953  NA 138  K 4.4  CL 97*  CO2 30  GLUCOSE 118*  BUN 40*  CREATININE 1.25*  CALCIUM 12.9*  PROT 6.9  ALBUMIN 3.8  AST 33  ALT 25  ALKPHOS 55  BILITOT 1.0  GFRNONAA 41*  ANIONGAP 11    Lipids No results for input(s): "CHOL", "TRIG", "HDL", "LABVLDL", "LDLCALC", "CHOLHDL" in the last 168 hours.  Hematology Recent Labs  Lab 01/10/22 1953  WBC 9.2  RBC 4.35  HGB 15.0  HCT 46.1*  MCV 106.0*  MCH 34.5*  MCHC 32.5  RDW 13.7  PLT 118*   Thyroid No results for input(s): "TSH", "FREET4" in the last 168 hours.  BNP Recent Labs  Lab 01/10/22 1953  BNP 59.9    DDimer No results for input(s): "DDIMER" in the last 168 hours.   Radiology    CT Head Wo Contrast  Result Date: 01/11/2022 CLINICAL DATA:  Weakness and reduced appetite. EXAM: CT HEAD WITHOUT CONTRAST TECHNIQUE: Contiguous axial images were obtained from the base of  the skull through the vertex without intravenous contrast. RADIATION DOSE REDUCTION: This exam was performed according to the departmental dose-optimization program which includes automated exposure control, adjustment of the mA and/or kV according to patient size and/or use of iterative reconstruction technique. COMPARISON:  December 23, 2021 FINDINGS: Brain: There is mild cerebral atrophy with widening of the extra-axial spaces and ventricular dilatation. There are areas of decreased attenuation within the white matter tracts of the supratentorial brain, consistent with microvascular disease changes. Vascular: There is marked severity calcification of the bilateral cavernous carotid arteries. Skull: Normal. Negative for fracture or focal lesion. Sinuses/Orbits: A 9 mm x 9 mm left maxillary sinus polyp versus mucous retention cyst is seen. Other: None. IMPRESSION: 1. No acute intracranial abnormality. 2. Generalized cerebral atrophy with chronic white matter small vessel ischemic changes.  Electronically Signed   By: Virgina Norfolk M.D.   On: 01/11/2022 00:49   DG Chest 2 View  Result Date: 01/10/2022 CLINICAL DATA:  Weakness EXAM: CHEST - 2 VIEW COMPARISON:  12/23/2021, CT chest 05/06/2015 FINDINGS: Cardiomegaly with mild central congestion. Probable small right effusion. No focal consolidation. Aortic atherosclerosis. No pneumothorax. Electronic device over left lower chest. IMPRESSION: Cardiomegaly with mild central congestion and probable small right effusion. Electronically Signed   By: Donavan Foil M.D.   On: 01/10/2022 23:42   MR LUMBAR SPINE WO CONTRAST  Result Date: 01/10/2022 CLINICAL DATA:  Lumbar radiculopathy EXAM: MRI LUMBAR SPINE WITHOUT CONTRAST TECHNIQUE: Multiplanar, multisequence MR imaging of the lumbar spine was performed. No intravenous contrast was administered. COMPARISON:  01/10/2018 FINDINGS: Segmentation:  Standard. Alignment: Grade 1 retrolisthesis at L3-4 and grade 1 anterolisthesis at L4-5 Vertebrae:  No fracture, evidence of discitis, or bone lesion. Conus medullaris and cauda equina: Conus extends to the L2 level. Conus and cauda equina appear normal. Paraspinal and other soft tissues: Negative Disc levels: L1-L2: Normal disc space and facet joints. No spinal canal stenosis. No neural foraminal stenosis. L2-L3: Small disc bulge. Progression of moderate spinal canal stenosis. Mild bilateral neural foraminal stenosis. L3-L4: Unchanged small disc bulge. Left lateral recess narrowing without central spinal canal stenosis. Mild right and moderate left neural foraminal stenosis. L4-L5: Moderate facet arthrosis with disc uncovering and small central extrusion with superior migration. Mild spinal canal stenosis. Mild right and moderate left neural foraminal stenosis. L5-S1: Small disc bulge and moderate facet hypertrophy. No spinal canal stenosis. Progression of severe right and moderate left neural foraminal stenosis. Visualized sacrum: Normal. IMPRESSION: 1.  Progression of moderate L2-3 spinal canal stenosis. 2. Progression of severe right and moderate left L5-S1 neural foraminal stenosis. 3. Unchanged mild spinal canal stenosis and moderate left neural foraminal stenosis at L4-L5. 4. Moderate left L3-4 neural foraminal stenosis. Electronically Signed   By: Ulyses Jarred M.D.   On: 01/10/2022 21:21    Cardiac Studies   ECHO: pending  Patient Profile     88 y.o. female w/ hx f recent acute CVA (suspected embolic), PAD status post bilateral femoral endarterectomy (2019), hypertension, hyperlipidemia, gout, chronic hypoxic respiratory failure, neuropathy, was admitted with weakness.  Troponin was elevated, Cards asked to see.  Assessment & Plan    Elevated troponin - no CP/SOB - recent echo w/ nl EF, nl Myoview 2021 - ILR inserted after CVA on 12/24/2021, no downloads yet but is in SR on telemetry - f/u on echo, if nl, may not need ischemic eval.  2. Weakness and other issues, per IM - see MR lumbar spine, has progression of disease  For questions or updates, please contact [place] Please consult [email or web address] for contact info under        Signed, Rosaria Ferries, PA-C  01/11/2022, 11:49 AM

## 2022-01-11 NOTE — ED Notes (Signed)
Pt transported to H&V.

## 2022-01-11 NOTE — Progress Notes (Signed)
   01/11/22 2309  Vitals  Temp 98.7 F (37.1 C)  Temp Source Oral  BP (!) 162/74  MAP (mmHg) 101  BP Location Right Arm  BP Method Automatic  Patient Position (if appropriate) Lying  Pulse Rate (!) 106  Pulse Rate Source Monitor  ECG Heart Rate (!) 106  Resp (!) 23  Level of Consciousness  Level of Consciousness Alert  MEWS COLOR  MEWS Score Color Yellow  Oxygen Therapy  SpO2 93 %  O2 Device Nasal Cannula  O2 Flow Rate (L/min) 3 L/min  Pain Assessment  Pain Scale 0-10  Pain Score 0  Glasgow Coma Scale  Eye Opening 4  Best Verbal Response (NON-intubated) 5  Best Motor Response 6  Glasgow Coma Scale Score 15  MEWS Score  MEWS Temp 0  MEWS Systolic 0  MEWS Pulse 1  MEWS RR 1  MEWS LOC 0  MEWS Score 2   Pt admitted to [facility] CCMD notified VS taken Placed on Telebox Call bell within reach Bed alarm on

## 2022-01-11 NOTE — Telephone Encounter (Signed)
Received the following alert from [employer]:  ILR alert for tachy episodes x 2 on 01/10/22 at 1436/1447, longest episode 7 min/44 sec with avg rates 154bpm. EGM appears to be ST with PVC's. Presenting rhythm ST, 115bpm (01/10/22). Routing for further review (FYI), EPIC documents recent ED and hospital admission. - JJB  Patient is currently admitted to hospital with elevated troponin and weakness.  Will forward to Dr. Myles Gip as an Juluis Rainier.

## 2022-01-12 ENCOUNTER — Ambulatory Visit: Payer: Medicare Other | Admitting: Internal Medicine

## 2022-01-12 DIAGNOSIS — D7589 Other specified diseases of blood and blood-forming organs: Secondary | ICD-10-CM | POA: Diagnosis present

## 2022-01-12 DIAGNOSIS — R7989 Other specified abnormal findings of blood chemistry: Secondary | ICD-10-CM | POA: Diagnosis not present

## 2022-01-12 DIAGNOSIS — I249 Acute ischemic heart disease, unspecified: Secondary | ICD-10-CM | POA: Diagnosis present

## 2022-01-12 DIAGNOSIS — R627 Adult failure to thrive: Secondary | ICD-10-CM | POA: Diagnosis present

## 2022-01-12 DIAGNOSIS — E860 Dehydration: Secondary | ICD-10-CM | POA: Diagnosis present

## 2022-01-12 DIAGNOSIS — I130 Hypertensive heart and chronic kidney disease with heart failure and stage 1 through stage 4 chronic kidney disease, or unspecified chronic kidney disease: Secondary | ICD-10-CM | POA: Diagnosis present

## 2022-01-12 DIAGNOSIS — Z6830 Body mass index (BMI) 30.0-30.9, adult: Secondary | ICD-10-CM | POA: Diagnosis not present

## 2022-01-12 DIAGNOSIS — J9611 Chronic respiratory failure with hypoxia: Secondary | ICD-10-CM | POA: Diagnosis present

## 2022-01-12 DIAGNOSIS — I739 Peripheral vascular disease, unspecified: Secondary | ICD-10-CM | POA: Diagnosis present

## 2022-01-12 DIAGNOSIS — I69365 Other paralytic syndrome following cerebral infarction, bilateral: Secondary | ICD-10-CM | POA: Diagnosis not present

## 2022-01-12 DIAGNOSIS — I2489 Other forms of acute ischemic heart disease: Secondary | ICD-10-CM | POA: Diagnosis present

## 2022-01-12 DIAGNOSIS — E871 Hypo-osmolality and hyponatremia: Secondary | ICD-10-CM | POA: Diagnosis not present

## 2022-01-12 DIAGNOSIS — Z87891 Personal history of nicotine dependence: Secondary | ICD-10-CM | POA: Diagnosis not present

## 2022-01-12 DIAGNOSIS — D751 Secondary polycythemia: Secondary | ICD-10-CM | POA: Diagnosis present

## 2022-01-12 DIAGNOSIS — Z8673 Personal history of transient ischemic attack (TIA), and cerebral infarction without residual deficits: Secondary | ICD-10-CM | POA: Diagnosis not present

## 2022-01-12 DIAGNOSIS — Z79899 Other long term (current) drug therapy: Secondary | ICD-10-CM | POA: Diagnosis not present

## 2022-01-12 DIAGNOSIS — M4800 Spinal stenosis, site unspecified: Secondary | ICD-10-CM | POA: Diagnosis not present

## 2022-01-12 DIAGNOSIS — M109 Gout, unspecified: Secondary | ICD-10-CM | POA: Diagnosis present

## 2022-01-12 DIAGNOSIS — N1832 Chronic kidney disease, stage 3b: Secondary | ICD-10-CM | POA: Diagnosis present

## 2022-01-12 DIAGNOSIS — I214 Non-ST elevation (NSTEMI) myocardial infarction: Secondary | ICD-10-CM | POA: Diagnosis not present

## 2022-01-12 DIAGNOSIS — E785 Hyperlipidemia, unspecified: Secondary | ICD-10-CM | POA: Diagnosis present

## 2022-01-12 DIAGNOSIS — I5032 Chronic diastolic (congestive) heart failure: Secondary | ICD-10-CM | POA: Diagnosis present

## 2022-01-12 DIAGNOSIS — E440 Moderate protein-calorie malnutrition: Secondary | ICD-10-CM | POA: Diagnosis present

## 2022-01-12 DIAGNOSIS — Z1152 Encounter for screening for COVID-19: Secondary | ICD-10-CM | POA: Diagnosis not present

## 2022-01-12 DIAGNOSIS — G629 Polyneuropathy, unspecified: Secondary | ICD-10-CM | POA: Diagnosis present

## 2022-01-12 DIAGNOSIS — J449 Chronic obstructive pulmonary disease, unspecified: Secondary | ICD-10-CM | POA: Diagnosis present

## 2022-01-12 LAB — BASIC METABOLIC PANEL
Anion gap: 10 (ref 5–15)
BUN: 26 mg/dL — ABNORMAL HIGH (ref 8–23)
CO2: 25 mmol/L (ref 22–32)
Calcium: 11 mg/dL — ABNORMAL HIGH (ref 8.9–10.3)
Chloride: 99 mmol/L (ref 98–111)
Creatinine, Ser: 0.93 mg/dL (ref 0.44–1.00)
GFR, Estimated: 59 mL/min — ABNORMAL LOW (ref >60)
Glucose, Bld: 88 mg/dL (ref 70–99)
Potassium: 4.2 mmol/L (ref 3.5–5.1)
Sodium: 134 mmol/L — ABNORMAL LOW (ref 135–145)

## 2022-01-12 LAB — MRSA NEXT GEN BY PCR, NASAL: MRSA by PCR Next Gen: NOT DETECTED

## 2022-01-12 LAB — CBC
HCT: 45.9 % (ref 36.0–46.0)
Hemoglobin: 14.8 g/dL (ref 12.0–15.0)
MCH: 34 pg (ref 26.0–34.0)
MCHC: 32.2 g/dL (ref 30.0–36.0)
MCV: 105.5 fL — ABNORMAL HIGH (ref 80.0–100.0)
Platelets: 98 10*3/uL — ABNORMAL LOW (ref 150–400)
RBC: 4.35 MIL/uL (ref 3.87–5.11)
RDW: 13.6 % (ref 11.5–15.5)
WBC: 7.1 10*3/uL (ref 4.0–10.5)
nRBC: 0 % (ref 0.0–0.2)

## 2022-01-12 LAB — VITAMIN B12: Vitamin B-12: 593 pg/mL (ref 180–914)

## 2022-01-12 MED ORDER — DEXTROSE-NACL 5-0.9 % IV SOLN: INTRAVENOUS | Status: DC

## 2022-01-12 MED ORDER — ENOXAPARIN SODIUM 40 MG/0.4ML IJ SOSY
40.0000 mg | PREFILLED_SYRINGE | INTRAMUSCULAR | Status: DC
  Administered 2022-01-13 – 2022-01-15 (×3): 40 mg via SUBCUTANEOUS
  Filled 2022-01-12 (×3): qty 0.4

## 2022-01-12 MED ORDER — GABAPENTIN 400 MG PO CAPS
400.0000 mg | ORAL_CAPSULE | Freq: Three times a day (TID) | ORAL | Status: DC
  Administered 2022-01-12 – 2022-01-15 (×8): 400 mg via ORAL
  Filled 2022-01-12 (×8): qty 1

## 2022-01-12 NOTE — Assessment & Plan Note (Signed)
Positive troponin elevation, no chest pain,  No EKG changes suggesting ischemia Echocardiogram with preserved LV systolic function with no wall motion abnormalities.   At this point ruled out acute coronary syndrome.

## 2022-01-12 NOTE — Progress Notes (Addendum)
Rounding Note    Patient Name: Gina [name] Date of Encounter: 01/12/2022  [facility] Cardiologist: Mertie Moores, MD   Subjective   Patient seen on AM rounds. Denies any chest pain or shortness of breath. Two tachycardia episodes noted on loop recorded and updated to the chart on 01/11/2022. Endorses left side pain with rib fractures noted on chest CTA.  Inpatient Medications    Scheduled Meds:  allopurinol  100 mg Oral q AM   amLODipine  5 mg Oral Daily   atorvastatin  40 mg Oral QPM   enoxaparin (LOVENOX) injection  30 mg Subcutaneous Q24H   fluticasone furoate-vilanterol  1 puff Inhalation Daily   And   umeclidinium bromide  1 puff Inhalation Daily   gabapentin  400 mg Oral QID   loratadine  10 mg Oral Daily   losartan  50 mg Oral q AM   ticagrelor  90 mg Oral BID   triamterene-hydrochlorothiazide  1 tablet Oral q AM   Continuous Infusions:  PRN Meds: acetaminophen, hydrALAZINE, ondansetron (ZOFRAN) IV   Vital Signs    Vitals:   01/11/22 2325 01/12/22 0549 01/12/22 0731 01/12/22 0813  BP:  (!) 153/66 (!) 144/67   Pulse:  97 98   Resp: 20 20 (!) 25   Temp:  98.6 F (37 C)    TempSrc:  Oral    SpO2:  92% 91% 92%  Weight:  70.7 kg    Height:        Intake/Output Summary (Last 24 hours) at 01/12/2022 1006 Last data filed at 01/12/2022 0851 Gross per 24 hour  Intake 240 ml  Output 450 ml  Net -210 ml      01/12/2022    5:49 AM 01/11/2022   11:09 PM 01/10/2022   11:12 PM  Last 3 Weights  Weight (lbs) 155 lb 13.8 oz 160 lb 0.9 oz 163 lb 2.3 oz  Weight (kg) 70.7 kg 72.6 kg 74 kg      Telemetry    Sinus to sinus tach, rates of 80-110- Personally Reviewed  ECG    No new tracings - Personally Reviewed  Physical Exam   GEN: elderly , somewhat frail female, No acute distress.   Neck: No JVD Cardiac: RRR, no murmurs, rubs, or gallops.  Respiratory: Diminished to auscultation bilaterally. Respirations are unlabored at rest on 3L  O2 via [place]. GI: Soft, nontender, non-distended  MS: No edema; No deformity. Left upper extremity weakness from recent CVA Neuro:  Nonfocal  Psych: Normal affect   Labs    High Sensitivity Troponin:   Recent Labs  Lab 01/10/22 1953 01/10/22 2143 01/11/22 0546 01/11/22 0812  TROPONINIHS 255* 302* 230* 209*     Chemistry Recent Labs  Lab 01/10/22 1953 01/11/22 1613 01/12/22 0100  NA 138 141 134*  K 4.4 4.4 4.2  CL 97* 101 99  CO2 30 29 25   GLUCOSE 118* 87 88  BUN 40* 30* 26*  CREATININE 1.25* 1.01* 0.93  CALCIUM 12.9* 11.9* 11.0*  PROT 6.9  --   --   ALBUMIN 3.8  --   --   AST 33  --   --   ALT 25  --   --   ALKPHOS 55  --   --   BILITOT 1.0  --   --   GFRNONAA 41* 54* 59*  ANIONGAP 11 11 10     Lipids No results for input(s): "CHOL", "TRIG", "HDL", "LABVLDL", "LDLCALC", "CHOLHDL" in the last 168  hours.  Hematology Recent Labs  Lab 01/10/22 1953 01/12/22 0100  WBC 9.2 7.1  RBC 4.35 4.35  HGB 15.0 14.8  HCT 46.1* 45.9  MCV 106.0* 105.5*  MCH 34.5* 34.0  MCHC 32.5 32.2  RDW 13.7 13.6  PLT 118* 98*   Thyroid No results for input(s): "TSH", "FREET4" in the last 168 hours.  BNP Recent Labs  Lab 01/10/22 1953  BNP 59.9    DDimer  Recent Labs  Lab 01/11/22 1613  DDIMER >20.00*     Radiology    CT Angio Chest Pulmonary Embolism (PE) W or WO Contrast  Result Date: 01/11/2022 CLINICAL DATA:  Weakness worsening tremors EXAM: CT ANGIOGRAPHY CHEST WITH CONTRAST TECHNIQUE: Multidetector CT imaging of the chest was performed using the standard protocol during bolus administration of intravenous contrast. Multiplanar CT image reconstructions and MIPs were obtained to evaluate the vascular anatomy. RADIATION DOSE REDUCTION: This exam was performed according to the departmental dose-optimization program which includes automated exposure control, adjustment of the mA and/or kV according to patient size and/or use of iterative reconstruction technique. CONTRAST:   76mL OMNIPAQUE IOHEXOL 350 MG/ML SOLN COMPARISON:  Chest x-ray 01/10/2022, CT chest 05/06/2015 FINDINGS: Cardiovascular: Satisfactory opacification of the pulmonary arteries to the segmental level. No evidence of pulmonary embolism. Nonaneurysmal aorta. Severe aortic atherosclerosis. Dilated pulmonary trunk measuring up to 3.7 cm. Coronary vascular calcification. Cardiomegaly. No pericardial effusion Mediastinum/Nodes: Midline trachea. No thyroid mass. Borderline mediastinal lymph nodes. Right paratracheal nodes up to 9 mm. Left paratracheal nodes up to 10 mm. Esophagus within normal limits. Lungs/Pleura: Probable subpleural atelectasis in the posterior lung bases. New irregular left upper lobe pulmonary nodule measuring 11 mm, series 6, image 67. Additional small pulmonary nodules. For example right upper lobe anterior subpleural 3 mm nodule, series 6, image 47. 4 mm left superior left lower lobe pulmonary nodule series 6, image 53. Upper Abdomen: Small ascites adjacent to the liver. No acute finding in the upper abdomen. Musculoskeletal: Acute appearing mildly displaced left third, fourth, and fifth anterior rib fractures. Multilevel degenerative changes of the spine. Review of the MIP images confirms the above findings. IMPRESSION: 1. Negative for acute pulmonary embolus. 2. Acute appearing mildly displaced left third, fourth, and fifth anterior rib fractures. 3. Cardiomegaly. Dilated pulmonary trunk suggesting pulmonary arterial hypertension. 4. New irregular 11 mm left upper lobe pulmonary nodule which could be infectious, inflammatory or neoplastic in etiology. Consider one of the following in 3 months for both low-risk and high-risk individuals: (a) repeat chest CT, (b) follow-up PET-CT, or (c) tissue sampling. This recommendation follows the consensus statement: Guidelines for Management of Incidental Pulmonary Nodules Detected on CT Images: From the Fleischner Society 2017; Radiology 2017; 284:228-243.  Additional small bilateral pulmonary nodules are present and may also be reassessed at 3 month CT follow-up. 5. Aortic atherosclerosis. Aortic Atherosclerosis (ICD10-I70.0). Electronically Signed   By: Donavan Foil M.D.   On: 01/11/2022 23:17   ECHOCARDIOGRAM COMPLETE  Result Date: 01/11/2022    ECHOCARDIOGRAM REPORT   Patient Name:   CHARLES NIESE Date of Exam: 01/11/2022 Medical Rec #:  [id number]         Height:       60.0 in Accession #:    [id number]        Weight:       163.1 lb Date of Birth:  [birth year]         BSA:          1.712 m Patient Age:  89 years          BP:           111/64 mmHg Patient Gender: F                 HR:           94 bpm. Exam Location:  Inpatient Procedure: 2D Echo, Cardiac Doppler and Color Doppler Indications:    chest pain  History:        Patient has prior history of Echocardiogram examinations, most                 recent 12/24/2021. CHF, PAD; Risk Factors:Hypertension and                 Dyslipidemia.  Sonographer:    Melissa Morford RDCS (AE, PE) Referring Phys: [id number] RONDELL A SMITH IMPRESSIONS  1. Left ventricular ejection fraction, by estimation, is 55 to 60%. The left ventricle has normal function. The left ventricle has no regional wall motion abnormalities. Left ventricular diastolic parameters are consistent with Grade I diastolic dysfunction (impaired relaxation). Elevated left atrial pressure.  2. Right ventricular systolic function is mildly reduced. The right ventricular size is moderately enlarged. Moderately increased right ventricular wall thickness. There is severely elevated pulmonary artery systolic pressure. The estimated right ventricular systolic pressure is 61.9 mmHg.  3. Left atrial size was mildly dilated.  4. Right atrial size was mildly dilated.  5. A small pericardial effusion is present. The pericardial effusion is posterior to the left ventricle.  6. The mitral valve is degenerative. No evidence of mitral valve regurgitation. No evidence  of mitral stenosis. Moderate mitral annular calcification.  7. The aortic valve is tricuspid. There is mild calcification of the aortic valve. There is moderate thickening of the aortic valve. Aortic valve regurgitation is not visualized. Aortic valve sclerosis/calcification is present, without any evidence of aortic stenosis.  8. The inferior vena cava is normal in size with <50% respiratory variability, suggesting right atrial pressure of 8 mmHg. Comparison(s): Prior images reviewed side by side. Pulmonary artery hypertension is worse. FINDINGS  Left Ventricle: Left ventricular ejection fraction, by estimation, is 55 to 60%. The left ventricle has normal function. The left ventricle has no regional wall motion abnormalities. The left ventricular internal cavity size was normal in size. There is  no left ventricular hypertrophy. Left ventricular diastolic parameters are consistent with Grade I diastolic dysfunction (impaired relaxation). Elevated left atrial pressure. Right Ventricle: The right ventricular size is moderately enlarged. Moderately increased right ventricular wall thickness. Right ventricular systolic function is mildly reduced. There is severely elevated pulmonary artery systolic pressure. The tricuspid  regurgitant velocity is 3.69 m/s, and with an assumed right atrial pressure of 8 mmHg, the estimated right ventricular systolic pressure is 50.9 mmHg. Left Atrium: Left atrial size was mildly dilated. Right Atrium: Right atrial size was mildly dilated. Pericardium: A small pericardial effusion is present. The pericardial effusion is posterior to the left ventricle. Mitral Valve: The mitral valve is degenerative in appearance. Moderate mitral annular calcification. No evidence of mitral valve regurgitation. No evidence of mitral valve stenosis. Tricuspid Valve: The tricuspid valve is normal in structure. Tricuspid valve regurgitation is not demonstrated. Aortic Valve: The aortic valve is tricuspid.  There is mild calcification of the aortic valve. There is moderate thickening of the aortic valve. Aortic valve regurgitation is not visualized. Aortic valve sclerosis/calcification is present, without any evidence of aortic stenosis. Aortic valve mean gradient measures 7.0 mmHg.  Aortic valve peak gradient measures 11.4 mmHg. Aortic valve area, by VTI measures 2.12 cm. Pulmonic Valve: The pulmonic valve was normal in structure. Pulmonic valve regurgitation is not visualized. Aorta: The aortic root and ascending aorta are structurally normal, with no evidence of dilitation. Venous: The inferior vena cava is normal in size with less than 50% respiratory variability, suggesting right atrial pressure of 8 mmHg. IAS/Shunts: No atrial level shunt detected by color flow Doppler.  LEFT VENTRICLE PLAX 2D LVIDd:         4.50 cm   Diastology LVIDs:         3.10 cm   LV e' medial:    3.81 cm/s LV PW:         1.00 cm   LV E/e' medial:  21.7 LV IVS:        1.00 cm   LV e' lateral:   5.00 cm/s LVOT diam:     2.13 cm   LV E/e' lateral: 16.5 LV SV:         67 LV SV Index:   39 LVOT Area:     3.56 cm  RIGHT VENTRICLE RV S prime:     16.50 cm/s TAPSE (M-mode): 1.8 cm LEFT ATRIUM             Index        RIGHT ATRIUM           Index LA diam:        4.00 cm 2.34 cm/m   RA Area:     19.20 cm LA Vol (A2C):   48.4 ml 28.27 ml/m  RA Volume:   54.20 ml  31.66 ml/m LA Vol (A4C):   53.0 ml 30.96 ml/m LA Biplane Vol: 55.3 ml 32.31 ml/m  AORTIC VALVE AV Area (Vmax):    2.04 cm AV Area (Vmean):   1.86 cm AV Area (VTI):     2.12 cm AV Vmax:           169.00 cm/s AV Vmean:          120.000 cm/s AV VTI:            0.316 m AV Peak Grad:      11.4 mmHg AV Mean Grad:      7.0 mmHg LVOT Vmax:         96.90 cm/s LVOT Vmean:        62.700 cm/s LVOT VTI:          0.188 m LVOT/AV VTI ratio: 0.59  AORTA Ao Root diam: 3.00 cm Ao Asc diam:  2.90 cm MITRAL VALVE                TRICUSPID VALVE MV Area (PHT): 9.60 cm     TR Peak grad:   54.5 mmHg MV  Decel Time: 79 msec      TR Vmax:        369.00 cm/s MV E velocity: 82.50 cm/s MV A velocity: 159.00 cm/s  SHUNTS MV E/A ratio:  0.52         Systemic VTI:  0.19 m                             Systemic Diam: 2.13 cm Dani Gobble Croitoru MD Electronically signed by Sanda Klein MD Signature Date/Time: 01/11/2022/3:12:49 PM    Final    CT Head Wo Contrast  Result Date: 01/11/2022 CLINICAL DATA:  Weakness and reduced appetite.  EXAM: CT HEAD WITHOUT CONTRAST TECHNIQUE: Contiguous axial images were obtained from the base of the skull through the vertex without intravenous contrast. RADIATION DOSE REDUCTION: This exam was performed according to the departmental dose-optimization program which includes automated exposure control, adjustment of the mA and/or kV according to patient size and/or use of iterative reconstruction technique. COMPARISON:  December 23, 2021 FINDINGS: Brain: There is mild cerebral atrophy with widening of the extra-axial spaces and ventricular dilatation. There are areas of decreased attenuation within the white matter tracts of the supratentorial brain, consistent with microvascular disease changes. Vascular: There is marked severity calcification of the bilateral cavernous carotid arteries. Skull: Normal. Negative for fracture or focal lesion. Sinuses/Orbits: A 9 mm x 9 mm left maxillary sinus polyp versus mucous retention cyst is seen. Other: None. IMPRESSION: 1. No acute intracranial abnormality. 2. Generalized cerebral atrophy with chronic white matter small vessel ischemic changes. Electronically Signed   By: Virgina Norfolk M.D.   On: 01/11/2022 00:49   DG Chest 2 View  Result Date: 01/10/2022 CLINICAL DATA:  Weakness EXAM: CHEST - 2 VIEW COMPARISON:  12/23/2021, CT chest 05/06/2015 FINDINGS: Cardiomegaly with mild central congestion. Probable small right effusion. No focal consolidation. Aortic atherosclerosis. No pneumothorax. Electronic device over left lower chest. IMPRESSION:  Cardiomegaly with mild central congestion and probable small right effusion. Electronically Signed   By: Donavan Foil M.D.   On: 01/10/2022 23:42   MR LUMBAR SPINE WO CONTRAST  Result Date: 01/10/2022 CLINICAL DATA:  Lumbar radiculopathy EXAM: MRI LUMBAR SPINE WITHOUT CONTRAST TECHNIQUE: Multiplanar, multisequence MR imaging of the lumbar spine was performed. No intravenous contrast was administered. COMPARISON:  01/10/2018 FINDINGS: Segmentation:  Standard. Alignment: Grade 1 retrolisthesis at L3-4 and grade 1 anterolisthesis at L4-5 Vertebrae:  No fracture, evidence of discitis, or bone lesion. Conus medullaris and cauda equina: Conus extends to the L2 level. Conus and cauda equina appear normal. Paraspinal and other soft tissues: Negative Disc levels: L1-L2: Normal disc space and facet joints. No spinal canal stenosis. No neural foraminal stenosis. L2-L3: Small disc bulge. Progression of moderate spinal canal stenosis. Mild bilateral neural foraminal stenosis. L3-L4: Unchanged small disc bulge. Left lateral recess narrowing without central spinal canal stenosis. Mild right and moderate left neural foraminal stenosis. L4-L5: Moderate facet arthrosis with disc uncovering and small central extrusion with superior migration. Mild spinal canal stenosis. Mild right and moderate left neural foraminal stenosis. L5-S1: Small disc bulge and moderate facet hypertrophy. No spinal canal stenosis. Progression of severe right and moderate left neural foraminal stenosis. Visualized sacrum: Normal. IMPRESSION: 1. Progression of moderate L2-3 spinal canal stenosis. 2. Progression of severe right and moderate left L5-S1 neural foraminal stenosis. 3. Unchanged mild spinal canal stenosis and moderate left neural foraminal stenosis at L4-L5. 4. Moderate left L3-4 neural foraminal stenosis. Electronically Signed   By: Ulyses Jarred M.D.   On: 01/10/2022 21:21    Cardiac Studies  TTE 01/11/2022 1. Left ventricular ejection  fraction, by estimation, is 55 to 60%. The  left ventricle has normal function. The left ventricle has no regional  wall motion abnormalities. Left ventricular diastolic parameters are  consistent with Grade I diastolic  dysfunction (impaired relaxation). Elevated left atrial pressure.   2. Right ventricular systolic function is mildly reduced. The right  ventricular size is moderately enlarged. Moderately increased right  ventricular wall thickness. There is severely elevated pulmonary artery  systolic pressure. The estimated right  ventricular systolic pressure is 64.4 mmHg.   3. Left atrial size  was mildly dilated.   4. Right atrial size was mildly dilated.   5. A small pericardial effusion is present. The pericardial effusion is  posterior to the left ventricle.   6. The mitral valve is degenerative. No evidence of mitral valve  regurgitation. No evidence of mitral stenosis. Moderate mitral annular  calcification.   7. The aortic valve is tricuspid. There is mild calcification of the  aortic valve. There is moderate thickening of the aortic valve. Aortic  valve regurgitation is not visualized. Aortic valve  sclerosis/calcification is present, without any evidence of  aortic stenosis.   8. The inferior vena cava is normal in size with <50% respiratory  variability, suggesting right atrial pressure of 8 mmHg.   Comparison(s): Prior images reviewed side by side. Pulmonary artery  hypertension is worse.   Patient Profile     88 y.o. 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  Assessment & Plan    NSTEMI type II -remains chest pain free -Hs troponins trended 255->302->230->209, likely demand ischemia -EKG without ischemic changes noted -echocardiogram revealed  LVEF 55-60%, NRWMA, elevated pulmonary artery pressure, small pericardial effusion without tamponade, aortic sclerosis without stenosis -no current indication for Heparin infusion -continue antiplatelet per neurology recommendations of tricagrelor 90 mg bid -no asa due to allergy -last MPI 2021 no ischemia or scar, LVEF 66%, low risk study -ILR revealed two episodes on 01/11/2022 of tachycardic episodes -elevated d-dimer with CTA chest negative for PE with new found 11 mm left upper lobe pulmonary nodule which could be infectious/inflammatory/or neoplastic -no further ischemic evaluation needed at this time   Hypertension -blood pressure 144/67 -continue on amlodipine 5 mg daily, losartan 50 mg daily,  and triamterene-HCTZ 37.5/25 mg daily -continue with as needed hydralazine  -vital signs per unit protocol  Hyperlipidemia -LDL 33 -continue atorvastatin  Chronic kidney disease IIIb -serum creatinine 0.93 -monitor urine output -monitor/trend/replete electrolytes as needed -daily bmp -avoid nephrotoxic agents were able   5.   Weakness with failure to thrive -supportive care -currently working with PT/OT -management per IM  6.   Recent CVA -11/9-11/10 small right precentral gyrus infarct -likely embolic cryptogenic source -ILR placed 11/10 to evaluate for atrial fibrillation -plavix was discontinued and she was started on Brilinta 90 mg bid     For questions or updates, please contact [place] Please consult [email or web address] for contact info under        Signed, SHERI HAMMOCK, NP  01/12/2022, 10:06 AM    Attending Note:   The patient was seen and examined.  Agree with assessment and plan as noted above.  Changes made to the above note as needed.  Patient seen and independently examined with Melinda Crutch , NP .   We discussed all aspects of the encounter. I agree with the assessment and plan as stated above.     Elevated Troponin levels.  She has normal LV  function .   No regional wall motion abn.  The RV is dilated and has reduced function .  I have reviewed the echo from 12/24/21 and the RV function was reduced during that echo also .   CTA of the lungs yesterday did not show any pulmonary embolus  Implantable loop does not show any evidence of atrial fib  I do not think there is a cardiac etiology for  her generalized fatigue   2.  Failure to thrive:  she appears to have generalized failure to thrive.   Further plans / management per [facility]   3.  Implantable loop:  follow up with Dr. Lindajo Royal   Will sign off . Call for questions     I have spent a total of 40 minutes with patient reviewing hospital  notes , telemetry, EKGs, labs and examining patient as well as establishing an assessment and plan that was discussed with the patient.  > 50% of time was spent in direct patient care.    Thayer Headings, Brooke Bonito., MD, [facility] 01/12/2022, 12:06 PM [address]  [place] Pager [phone]

## 2022-01-12 NOTE — Assessment & Plan Note (Addendum)
No clinical signs of exacerbation  Patient was placed on IV fluids with good toleration.  At the time of her discharge will continue blood pressure monitoring.  Loop diuretic therapy only as needed.  Hold on thiazide diuretic to prevent dehydration and electrolyte abnormalities.

## 2022-01-12 NOTE — Assessment & Plan Note (Signed)
Continue pain control, Pt and OT

## 2022-01-12 NOTE — Evaluation (Signed)
Physical Therapy Evaluation Patient Details Name: Gina [name] MRN: [id number] DOB: 05-07-1931 Today's Date: 01/12/2022  History of Present Illness  Pt is an 88 y/o F presenting to ED On 11/27 with generalized weakness and worsening tremors. Admitted for NSTEMI. Of note, pt with recent hospitalization 11/9-11/10 for small R precentral gyrus infarct. PMH includes CVA, HTN, HLD, gout, colon CA, prior GI bleed, tobacco abuse, PAD   Clinical Impression  Pt presents with condition above and deficits mentioned below, see PT Problem List. PTA, she was mod I using a RW for mobility, living alone in a 2-level house with 2 STE. Her daughter-in-law is able to assist her as needed around the clock though. Currently, pt demonstrates deficits in gross strength, static and dynamic balance, endurance, and muscular power. She is requiring up to minA to prevent a posterior LOB with transfers and min guard assist to ambulate a short distance of ~20 ft with a RW at this time. Educated pt and her daughter-in-law on her need for assistance with all standing mobility and to use the gait belt provided. Recommending follow-up with HHPT at d/c. Will continue to follow acutely.      Recommendations for follow up therapy are one component of a multi-disciplinary discharge planning process, led by the attending physician.  Recommendations may be updated based on patient status, additional functional criteria and insurance authorization.  Follow Up Recommendations Home health PT      Assistance Recommended at Discharge Intermittent Supervision/Assistance  Patient can return home with the following  A little help with walking and/or transfers;A little help with bathing/dressing/bathroom;Assistance with cooking/housework;Assist for transportation;Help with stairs or ramp for entrance    Equipment Recommendations BSC/3in1  Recommendations for Other Services       Functional Status Assessment Patient has had a recent  decline in their functional status and demonstrates the ability to make significant improvements in function in a reasonable and predictable amount of time.     Precautions / Restrictions Precautions Precautions: Fall Precaution Comments: 2L O2 baseline; urinary incontinence Restrictions Weight Bearing Restrictions: No      Mobility  Bed Mobility Overal bed mobility: Needs Assistance Bed Mobility: Sit to Supine       Sit to supine: Min assist   General bed mobility comments: MinA to control trunk to lay down, needing increased time to bring legs up onto bed and to middle of bed    Transfers Overall transfer level: Needs assistance Equipment used: Rolling walker (2 wheels) Transfers: Sit to/from Stand Sit to Stand: Min assist           General transfer comment: Posterior lean noted, minA to prevent LOB    Ambulation/Gait Ambulation/Gait assistance: Min guard Gait Distance (Feet): 20 Feet Assistive device: Rolling walker (2 wheels) Gait Pattern/deviations: Step-through pattern, Decreased stride length, Trunk flexed Gait velocity: decreased Gait velocity interpretation: <1.31 ft/sec, indicative of household ambulator   General Gait Details: Pt with very slow, small steps within room. No LOB, but mild instability noted, min guard assist for safety  Stairs            Wheelchair Mobility    Modified Rankin (Stroke Patients Only)       Balance Overall balance assessment: Needs assistance Sitting-balance support: Feet supported, No upper extremity supported Sitting balance-Leahy Scale: Good     Standing balance support: During functional activity, Reliant on assistive device for balance, Bilateral upper extremity supported Standing balance-Leahy Scale: Poor Standing balance comment: Requires UE support for walking  Pertinent Vitals/Pain Pain Assessment Pain Assessment: No/denies pain    Home Living  Family/patient expects to be discharged to:: Private residence Living Arrangements: Alone Available Help at Discharge: Family;Friend(s);Neighbor;Available 24 hours/day Type of Home: House Home Access: Stairs to enter Entrance Stairs-Rails: Left Entrance Stairs-Number of Steps: 2   Home Layout: Two level;Able to live on main level with bedroom/bathroom Home Equipment: Rolling Walker (2 wheels);Cane - single point;Shower seat - built in      Prior Function Prior Level of Function : Independent/Modified Independent             Mobility Comments: uses RW for mobility ADLs Comments: was ind     Hand Dominance   Dominant Hand: Right    Extremity/Trunk Assessment   Upper Extremity Assessment Upper Extremity Assessment: Defer to OT evaluation LUE Deficits / Details: decreased coordination, but functional per pt report    Lower Extremity Assessment Lower Extremity Assessment: Generalized weakness    Cervical / Trunk Assessment Cervical / Trunk Assessment: Normal  Communication   Communication: No difficulties  Cognition Arousal/Alertness: Awake/alert Behavior During Therapy: WFL for tasks assessed/performed Overall Cognitive Status: Within Functional Limits for tasks assessed                                          General Comments General comments (skin integrity, edema, etc.): VSS on 3L O2; educated pt and family on getting up and mobilizing >/= 2-3x/day while here with staff    Exercises     Assessment/Plan    PT Assessment Patient needs continued PT services  PT Problem List Decreased strength;Decreased mobility;Decreased activity tolerance;Decreased balance;Cardiopulmonary status limiting activity       PT Treatment Interventions Therapeutic activities;Gait training;Therapeutic exercise;Patient/family education;Balance training;Stair training;Functional mobility training;Neuromuscular re-education;DME instruction    PT Goals (Current goals  can be found in the Care Plan section)  Acute Rehab PT Goals Patient Stated Goal: to get better PT Goal Formulation: With patient/family Time For Goal Achievement: 01/26/22 Potential to Achieve Goals: Good    Frequency Min 3X/week     Co-evaluation               AM-PAC PT "6 Clicks" Mobility  Outcome Measure Help needed turning from your back to your side while in a flat bed without using bedrails?: A Little Help needed moving from lying on your back to sitting on the side of a flat bed without using bedrails?: A Little Help needed moving to and from a bed to a chair (including a wheelchair)?: A Little Help needed standing up from a chair using your arms (e.g., wheelchair or bedside chair)?: A Little Help needed to walk in hospital room?: A Little Help needed climbing 3-5 steps with a railing? : A Little 6 Click Score: 18    End of Session Equipment Utilized During Treatment: Gait belt Activity Tolerance: Patient tolerated treatment well Patient left: in bed;with call bell/phone within reach;with bed alarm set;with family/visitor present   PT Visit Diagnosis: Muscle weakness (generalized) (M62.81);Unsteadiness on feet (R26.81);Other abnormalities of gait and mobility (R26.89);Difficulty in walking, not elsewhere classified (R26.2)    Time: 3419-3790 PT Time Calculation (min) (ACUTE ONLY): 22 min   Charges:   PT Evaluation $PT Eval Moderate Complexity: 1 Mod          Moishe Spice, PT, DPT Acute Rehabilitation Services  Office: [phone]   Orvan Falconer 01/12/2022, 10:42  AM

## 2022-01-12 NOTE — Evaluation (Signed)
Occupational Therapy Evaluation Patient Details Name: Gina [name] MRN: [id number] DOB: 1931-11-17 Today's Date: 01/12/2022   History of Present Illness Pt is an 88 y/o F presenting to ED On 11/27 with generalized weakness and worsening tremors. Admitted for NSTEMI. Of note, pt with recent hospitalization 11/9-11/10 for small R precentral gyrus infarct. PMH includes CVA, HTN, HLD, gout, colon CA, prior GI bleed, tobacco abuse, PAD   Clinical Impression   Pt reports independence at baseline with ADLs and uses RW for functional mobility. Pt lives alone but her daughter in law states she will be staying with pt around the clock for however long she needs. Pt currently needing min-mod A for ADLs, min A for bed mobility, and min guard-min A for transfers with RW. VSS on 3L O2 during session. Pt presenting with impairments listed below, will follow acutely. Recommend HHOT at d/c.      Recommendations for follow up therapy are one component of a multi-disciplinary discharge planning process, led by the attending physician.  Recommendations may be updated based on patient status, additional functional criteria and insurance authorization.   Follow Up Recommendations  Home health OT     Assistance Recommended at Discharge Intermittent Supervision/Assistance  Patient can return home with the following A little help with walking and/or transfers;A lot of help with bathing/dressing/bathroom;Assistance with cooking/housework;Direct supervision/assist for medications management;Direct supervision/assist for financial management;Help with stairs or ramp for entrance;Assist for transportation    Functional Status Assessment  Patient has had a recent decline in their functional status and demonstrates the ability to make significant improvements in function in a reasonable and predictable amount of time.  Equipment Recommendations  BSC/3in1    Recommendations for Other Services PT consult      Precautions / Restrictions Precautions Precautions: Fall Precaution Comments: 2L O2 baseline; urinary incontinence Restrictions Weight Bearing Restrictions: No      Mobility Bed Mobility Overal bed mobility: Needs Assistance Bed Mobility: Supine to Sit     Supine to sit: Min assist     General bed mobility comments: increaseed ti me, cues to scoot forward to EOB    Transfers Overall transfer level: Needs assistance Equipment used: Rolling walker (2 wheels) Transfers: Sit to/from Stand Sit to Stand: Min guard, Min assist                  Balance Overall balance assessment: Needs assistance Sitting-balance support: Feet supported, No upper extremity supported Sitting balance-Leahy Scale: Good Sitting balance - Comments: Able to reach outside BoS and doff/donn socks/shoes without LOB   Standing balance support: During functional activity, Reliant on assistive device for balance Standing balance-Leahy Scale: Poor Standing balance comment: Requires UE support for walking                           ADL either performed or assessed with clinical judgement   ADL Overall ADL's : Needs assistance/impaired Eating/Feeding: Set up   Grooming: Set up;Sitting   Upper Body Bathing: Minimal assistance   Lower Body Bathing: Moderate assistance   Upper Body Dressing : Minimal assistance   Lower Body Dressing: Moderate assistance   Toilet Transfer: Minimal assistance;Stand-pivot;BSC/3in1;Rolling walker (2 wheels)   Toileting- Clothing Manipulation and Hygiene: Minimal assistance       Functional mobility during ADLs: Minimal assistance;Rolling walker (2 wheels)       Vision   Vision Assessment?: No apparent visual deficits     Perception Perception Perception Tested?: No  Praxis Praxis Praxis tested?: Not tested    Pertinent Vitals/Pain Pain Assessment Pain Assessment: No/denies pain     Hand Dominance Right   Extremity/Trunk Assessment  Upper Extremity Assessment Upper Extremity Assessment: LUE deficits/detail LUE Deficits / Details: decreased coordination, but functional per pt report   Lower Extremity Assessment Lower Extremity Assessment: Generalized weakness   Cervical / Trunk Assessment Cervical / Trunk Assessment: Normal   Communication Communication Communication: No difficulties   Cognition Arousal/Alertness: Awake/alert Behavior During Therapy: WFL for tasks assessed/performed Overall Cognitive Status: Within Functional Limits for tasks assessed                                       General Comments  VSS on 3L O2    Exercises     Shoulder Instructions      Home Living Family/patient expects to be discharged to:: Private residence Living Arrangements: Alone Available Help at Discharge: Family;Friend(s);Available PRN/intermittently;Neighbor Type of Home: House Home Access: Stairs to enter [employer] of Steps: 2 Entrance Stairs-Rails: Left Home Layout: Two level;Able to live on main level with bedroom/bathroom     Bathroom Shower/Tub: Occupational psychologist: Handicapped height Bathroom Accessibility: Yes   Home Equipment: Conservation officer, nature (2 wheels);Cane - single point;Shower seat - built in          Prior Functioning/Environment Prior Level of Function : Independent/Modified Independent             Mobility Comments: uses RW for mobility ADLs Comments: was ind        OT Problem List: Impaired UE functional use;Impaired balance (sitting and/or standing);Decreased range of motion;Decreased strength;Decreased activity tolerance;Decreased coordination      OT Treatment/Interventions:      OT Goals(Current goals can be found in the care plan section) Acute Rehab OT Goals Patient Stated Goal: to gain independence OT Goal Formulation: With patient/family Time For Goal Achievement: 01/26/22 Potential to Achieve Goals: Good ADL Goals Pt Will  Perform Upper Body Dressing: Independently;standing;sitting Pt Will Perform Lower Body Dressing: Independently;sit to/from stand;sitting/lateral leans Pt Will Transfer to Toilet: Independently;ambulating;regular height toilet Pt Will Perform Tub/Shower Transfer: Shower transfer;Independently;ambulating;shower seat;rolling walker Additional ADL Goal #1: pt will be able to stand x10 mins for functioal task in order to improve activity tolerance for ADLs  OT Frequency:      Co-evaluation              AM-PAC OT "6 Clicks" Daily Activity     Outcome Measure Help from another person eating meals?: None Help from another person taking care of personal grooming?: A Little Help from another person toileting, which includes using toliet, bedpan, or urinal?: A Lot Help from another person bathing (including washing, rinsing, drying)?: A Little Help from another person to put on and taking off regular upper body clothing?: A Little Help from another person to put on and taking off regular lower body clothing?: A Lot 6 Click Score: 17   End of Session Equipment Utilized During Treatment: Oxygen;Gait belt;Rolling walker (2 wheels) Nurse Communication: Mobility status  Activity Tolerance: Patient tolerated treatment well Patient left: with call bell/phone within reach;in chair;with family/visitor present  OT Visit Diagnosis: Muscle weakness (generalized) (M62.81)                Time: 1194-1740 OT Time Calculation (min): 40 min Charges:  OT General Charges $OT Visit: 1 Visit OT Evaluation $OT  Eval Moderate Complexity: 1 Mod OT Treatments $Self Care/Home Management : 23-37 mins  Renaye Rakers, OTD, OTR/L [employer] [facility] Acute Rehab [phone]  Renaye Rakers [place] 01/12/2022, 8:45 AM

## 2022-01-12 NOTE — Progress Notes (Signed)
Progress Note   Patient: Gina [name] AYT:[id number] DOB: [birth year] DOA: 01/10/2022     0 DOS: the patient was seen and examined on 01/12/2022   Brief hospital course: Gina [name] was admitted to the hospital with the working diagnosis of NSTEMI.   88 yo female with the past medical history if hypertension, dyslipidemia, peripheral vascular disease and GI bleed who presented with weakness and worsening tremors. Recent hospitalization 11/9 to 12/24/21 for acute ischemic CVA, right precentral gyrus. She was placed on ticagrelor and loop recorder was placed. At home she continue to have difficulty using her left hand, had decreased appetite, dyspnea and generalized fatigue that prompted her to come back to the hospital. On her initial physical examination she was hypoxemic and required supplemental 02 per [place], blood pressure 139/58, HR 83, RR 20 and 02 saturation 92%, lungs with mild decreased breath sounds at bases, heart with S1 and S2 present and rhythmic, no gallops, abdomen with no distention, positive lower extremity edema pitting.   Na 138, K 4,4 Cl 97 bicarbonate 30 glucose 118, bun 40 cr 1,25 High sensitive troponin 255 and 302  Wbc 9,2 hgb 15.0 plt 118  Sars covid 19 negative   Chest radiograph with cardiomegaly with mild hilar vascular congestion, and small bilateral pleural effusions.   EKG 97 bpm, right axis deviation, normal intervals, sinus rhythm with no significant ST segment or T wave changes.   CT chest with no pulmonary embolism. Positive 11 mm irregular left upper lobe pulmonary nodule.     Assessment and Plan: * NSTEMI (non-ST elevated myocardial infarction) (HCC) Positive troponin elevation, no chest pain,  No EKG changes suggesting ischemia Echocardiogram with preserved LV systolic function with no wall motion abnormalities.   At this point ruled out acute coronary syndrome.   Chronic respiratory failure with hypoxia (HCC) Continue supplemental 02 per [place].    History of CVA (cerebrovascular accident) without residual deficits Recent CVA, patient very weak and deconditioned, poor oral intake, Continue pt and ot  Nutrition consultation   Chronic diastolic CHF (congestive heart failure) (HCC) No clinical signs of exacerbation Patient with hypovolemia. Added IV fluids.,  Close follow up volume status Hold on diuretic therapy   Spinal stenosis Continue pain control, Pt and OT  Essential hypertension Continue blood pressure control with amlodipine and losartan.  Hold on diuretic therapy.   Chronic kidney disease, stage 3b (HCC) Hyponatremia.   Renal function with serum cr at 0,93 K is 4,2 and serum bicarbonate at 25 Na 134  Continue gently IV fluids with isotonic solution and follow up renal function in am.          Subjective: Patient with no chest pain or dyspnea, continue very weak and deconditioned, her appetite improved today but not back to baseline  Physical Exam: Vitals:   01/12/22 0549 01/12/22 0731 01/12/22 0813 01/12/22 1059  BP: (!) 153/66 (!) 144/67  (!) 152/71  Pulse: 97 98  90  Resp: 20 (!) 25  (!) 25  Temp: 98.6 F (37 C)     TempSrc: Oral   Oral  SpO2: 92% 91% 92% 97%  Weight: 70.7 kg     Height:       Neurology awake and alert ENT with mild pallor, dry mucous membranes  Cardiovascular with S1 and S2 present and rhythmic Respiratory with no rales or wheezing Abdomen with no distention No lower extremity edema  Data Reviewed:    Family Communication: I spoke with patient's son and daughter  in law at the bedside, we talked in detail about patient's condition, plan of care and prognosis and all questions were addressed.   Disposition: Status is: Inpatient Remains inpatient appropriate because: IV fluids   Planned Discharge Destination: Home     Author: Tawni Millers, MD 01/12/2022 5:50 PM  For on call review [email or web address].

## 2022-01-12 NOTE — Hospital Course (Addendum)
Mr. Roma was admitted to the hospital with the working diagnosis of dehydration complicated with elevated troponin.   88 yo female with the past medical history if hypertension, dyslipidemia, peripheral vascular disease and GI bleed who presented with weakness and worsening tremors. Recent hospitalization 11/9 to 12/24/21 for acute ischemic CVA, right precentral gyrus. She was placed on ticagrelor and loop recorder was placed. At home she continue to have difficulty using her left hand, had decreased appetite, dyspnea and generalized fatigue that prompted her to come back to the hospital. On her initial physical examination she was hypoxemic and required supplemental 02 per [place], blood pressure 139/58, HR 83, RR 20 and 02 saturation 92%, lungs with mild decreased breath sounds at bases, heart with S1 and S2 present and rhythmic, no gallops, abdomen with no distention, positive lower extremity edema pitting.   Na 138, K 4,4 Cl 97 bicarbonate 30 glucose 118, bun 40 cr 1,25 High sensitive troponin 255 and 302  Wbc 9,2 hgb 15.0 plt 118  Sars covid 19 negative   Chest radiograph with cardiomegaly with mild hilar vascular congestion, and small bilateral pleural effusions.   EKG 97 bpm, right axis deviation, normal intervals, sinus rhythm with no significant ST segment or T wave changes.   CT chest with no pulmonary embolism. Positive 11 mm irregular left upper lobe pulmonary nodule.   11/30 Patient has ruled out for acute coronary syndrome, clinically improving with supportive medical care. 12/01 renal function and electrolytes are stable, patient will need home health services.  Check ambulatory oxymetry on room air.  Follow up as outpatient.

## 2022-01-12 NOTE — Assessment & Plan Note (Addendum)
COPD with no clinical sings of exacerbation.   Continue supplemental 02 per [place].  Patient with no worsening respiratory failure, she ruled out for pulmonary infection and pulmonary embolism.  Continue supplemental 02 per [place] at home to keep 02 saturation 92% or greater.  Continue with bronchodilator therapy.

## 2022-01-12 NOTE — Progress Notes (Signed)
Ok to reduce gabapentin to 400mg  PO TID based on renal function per Dr Cathlean Sauer.  Onnie Boer, PharmD, BCIDP, AAHIVP, CPP Infectious Disease Pharmacist 01/12/2022 2:27 PM

## 2022-01-12 NOTE — Assessment & Plan Note (Addendum)
Continue blood pressure control with amlodipine and losartan.  Blood pressure has been 130 to 160 mmHg.  Hold on diuretic therapy and increase dose of amlodipine to 10 mg.

## 2022-01-12 NOTE — Assessment & Plan Note (Addendum)
Recent CVA, patient very weak and deconditioned, poor oral intake, Patient has been placed on nutritional supplements. No focal neurologic deficit.  Continue antiplatelet therapy, statin and blood pressure control. Continue with home PT and OT.

## 2022-01-12 NOTE — Assessment & Plan Note (Addendum)
Hyponatremia. Hypomagnesemia   Patient received isotonic saline with good toleration, her Mag was corrected with IV mag sulfate.  At the time of her discharge her renal function had a serum cr of 0.65, with K at 3,6 and serum bicarbonate at 26.  Mg 2,1. Will give Kcl 40 meq before discharge. Continue holding diuretic therapy. Follow up renal function and electrolytes as outpatient.

## 2022-01-13 ENCOUNTER — Encounter (HOSPITAL_COMMUNITY): Payer: Self-pay | Admitting: Internal Medicine

## 2022-01-13 DIAGNOSIS — R7989 Other specified abnormal findings of blood chemistry: Secondary | ICD-10-CM

## 2022-01-13 DIAGNOSIS — E440 Moderate protein-calorie malnutrition: Secondary | ICD-10-CM | POA: Diagnosis present

## 2022-01-13 DIAGNOSIS — I5032 Chronic diastolic (congestive) heart failure: Secondary | ICD-10-CM | POA: Diagnosis not present

## 2022-01-13 DIAGNOSIS — Z8673 Personal history of transient ischemic attack (TIA), and cerebral infarction without residual deficits: Secondary | ICD-10-CM | POA: Diagnosis not present

## 2022-01-13 DIAGNOSIS — J9611 Chronic respiratory failure with hypoxia: Secondary | ICD-10-CM | POA: Diagnosis not present

## 2022-01-13 LAB — BASIC METABOLIC PANEL
Anion gap: 9 (ref 5–15)
BUN: 21 mg/dL (ref 8–23)
CO2: 30 mmol/L (ref 22–32)
Calcium: 10.4 mg/dL — ABNORMAL HIGH (ref 8.9–10.3)
Chloride: 99 mmol/L (ref 98–111)
Creatinine, Ser: 0.84 mg/dL (ref 0.44–1.00)
GFR, Estimated: >60 mL/min (ref >60)
Glucose, Bld: 151 mg/dL — ABNORMAL HIGH (ref 70–99)
Potassium: 3.9 mmol/L (ref 3.5–5.1)
Sodium: 138 mmol/L (ref 135–145)

## 2022-01-13 LAB — URINE CULTURE: Culture: >=100000 — AB

## 2022-01-13 LAB — MAGNESIUM: Magnesium: 1.6 mg/dL — ABNORMAL LOW (ref 1.7–2.4)

## 2022-01-13 MED ORDER — MAGNESIUM SULFATE 4 GM/100ML IV SOLN
4.0000 g | Freq: Once | INTRAVENOUS | Status: AC
  Administered 2022-01-13: 4 g via INTRAVENOUS
  Filled 2022-01-13: qty 100

## 2022-01-13 MED ORDER — ENSURE ENLIVE PO LIQD
237.0000 mL | Freq: Two times a day (BID) | ORAL | Status: DC
  Administered 2022-01-13 – 2022-01-15 (×4): 237 mL via ORAL

## 2022-01-13 NOTE — Progress Notes (Signed)
Progress Note   Patient: Gina [name] AYT:[id number] DOB: 07-16-1931 DOA: 01/10/2022     1 DOS: the patient was seen and examined on 01/13/2022   Brief hospital course: Mr. Vanderlinden was admitted to the hospital with the working diagnosis of NSTEMI.   88 yo female with the past medical history if hypertension, dyslipidemia, peripheral vascular disease and GI bleed who presented with weakness and worsening tremors. Recent hospitalization 11/9 to 12/24/21 for acute ischemic CVA, right precentral gyrus. She was placed on ticagrelor and loop recorder was placed. At home she continue to have difficulty using her left hand, had decreased appetite, dyspnea and generalized fatigue that prompted her to come back to the hospital. On her initial physical examination she was hypoxemic and required supplemental 02 per [place], blood pressure 139/58, HR 83, RR 20 and 02 saturation 92%, lungs with mild decreased breath sounds at bases, heart with S1 and S2 present and rhythmic, no gallops, abdomen with no distention, positive lower extremity edema pitting.   Na 138, K 4,4 Cl 97 bicarbonate 30 glucose 118, bun 40 cr 1,25 High sensitive troponin 255 and 302  Wbc 9,2 hgb 15.0 plt 118  Sars covid 19 negative   Chest radiograph with cardiomegaly with mild hilar vascular congestion, and small bilateral pleural effusions.   EKG 97 bpm, right axis deviation, normal intervals, sinus rhythm with no significant ST segment or T wave changes.   CT chest with no pulmonary embolism. Positive 11 mm irregular left upper lobe pulmonary nodule.   11/30 Patient has ruled out for acute coronary syndrome, clinically improving with supportive medical care. Possible dc home tomorrow.  Assessment and Plan: * Elevated troponin Positive troponin elevation, no chest pain,  No EKG changes suggesting ischemia Echocardiogram with preserved LV systolic function with no wall motion abnormalities.   At this point ruled out acute  coronary syndrome.   Chronic respiratory failure with hypoxia (HCC) Continue supplemental 02 per [place].   History of CVA (cerebrovascular accident) without residual deficits Recent CVA, patient very weak and deconditioned, poor oral intake, Continue pt and ot  Nutrition consultation   Chronic diastolic CHF (congestive heart failure) (HCC) No clinical signs of exacerbation  Improved volume status, will decrease rate of IV fluids to 50 ml per hr. Continue to hold on diuretic therapy.   Spinal stenosis Continue pain control, Pt and OT  Essential hypertension Continue blood pressure control with amlodipine and losartan.  Hold on diuretic therapy.   Chronic kidney disease, stage 3b (HCC) Hyponatremia. Hypomagnesemia   Her volume status has improved, renal function today with serum cr at 0.84 with K at 3,9 and serum bicarbonate at 30.  Mg is 1,6  Plan to continue IV fluids 50 ml per hr Add 4 g Mag sulfate IV Follow up renal function in am.         Subjective: Patient with no chest pain or dyspnea, her appetite and po intake is slowly improving. No nausea or vomiting   Physical Exam: Vitals:   01/13/22 0720 01/13/22 0857 01/13/22 1006 01/13/22 1137  BP: (!) 152/75  135/69 (!) 141/69  Pulse: (!) 103  95 89  Resp: 18  20   Temp: 97.8 F (36.6 C)     TempSrc: Oral     SpO2: 90% 93% 92% 94%  Weight:      Height:       Neurology awake and alert ENT with mild pallor Cardiovascular with S1 and S2 present and rhythmic Respiratory with  no rales or wheezing Abdomen with no distention  No lower extremity edema  Data Reviewed:    Family Communication: I spoke with patient's son and daughter in law at the bedside, we talked in detail about patient's condition, plan of care and prognosis and all questions were addressed.   Disposition: Status is: Inpatient Remains inpatient appropriate because: IV fluids   Planned Discharge Destination: Home      Author: Tawni Millers, MD 01/13/2022 2:05 PM  For on call review [email or web address].

## 2022-01-13 NOTE — Progress Notes (Signed)
   01/13/22 1557  Mobility  Activity Transferred from chair to bed  Level of Assistance Moderate assist, patient does 50-74%  Assistive Device Front wheel walker  Distance Ambulated (ft) 2 ft  Activity Response Tolerated well  Mobility Referral Yes  $Mobility charge 1 Mobility   Mobility Specialist Progress Note  Pt in chair and agreeable to transfer. Had no c/o pain. Left in bed w/ all needs met and RN in room.   Lucious Groves Mobility Specialist  Please contact via SecureChat or Rehab office at [phone]

## 2022-01-13 NOTE — Progress Notes (Signed)
   01/13/22 1009  Mobility  Activity Ambulated with assistance in room  Level of Assistance Moderate assist, patient does 50-74%  Assistive Device Front wheel walker  Distance Ambulated (ft) 8 ft  Activity Response Tolerated fair  Mobility Referral Yes  $Mobility charge 1 Mobility   Mobility Specialist Progress Note  Post-Mobility:  135/69 BP, 92% SpO2  Pt was in bed and agreeable. Felt very weak during ambulation and was scared she would fall. Returned to bed w/ all needs met and call bell in reach. Rn in [place] Specialist  Please contact via Solicitor or [place] office at [phone]

## 2022-01-13 NOTE — TOC Progression Note (Addendum)
Transition of Care [facility]) - Progression Note    Patient Details  Name: Gina [name] MRN: [id number] Date of Birth: 11-Jan-1932  Transition of Care [facility]) CM/SW Contact  Zenon Mayo, RN Phone Number: 01/13/2022, 3:00 PM  Clinical Narrative:    NCM spoke with patient and son Clair Gulling who is her HPOA at the bedside, Jim's phone is [phone].  NCM offered choice for [facility] services, he chose [facility], he states he does not have a preference for DME company,  NCM made referral to Ulice Dash with [facility] for Hospital bed and [facility] to be delivered to patient's home.  Clair Gulling states Belenda Cruise , his brother's wife will be staying with her at home.  Clair Gulling states he will be transporting her home by car. NCM gave him a [employer]wick brochure and also informed him that he can go to Medicare. Gov to look up private duty care if needed.  NCM gave him some information on a Place for Mom also.  Patient has home oxygen with [employer] 2 liters that she uses at night only.  Ulice Dash with [facility] can not get the hospital bed out until Monday, so Clair Gulling wants to use another agency, NCM made referral to [place] with [facility] for Hospital bed and [facility], she states they can deliver it tomorrow, she will call Clair Gulling to speak with him. Clair Gulling also states he would like a transport chair for patient, NCM informed Cyril Mourning with [employer] of this information and MD.   12/1- NCM received call from Clair Gulling, patient's son and POA, he said that [facility] is delivering the hospital bed now and they forgot the side rails, they have to go back to pick them up and also he would like a bedside table.  NCM asked Cyril Mourning to order this for him, she states it will be no problem for them to get this for him but may be private pay.   Expected Discharge Plan: [place] Barriers to Discharge: Continued Medical Work up  Expected Discharge Plan and Services Expected Discharge Plan: [place] In-house Referral: NA Discharge Planning Services: CM  Consult Post Acute Care Choice: [place] arrangements for the past 2 months: Single Family Home                 DME Arranged: Bedside commode, Hospital bed DME Agency [facility] Date DME Agency Contacted: 01/13/22 Time DME Agency Contacted: 3500 Representative spoke with at [place]: RN, Disease Management, PT, OT, Nurse's Aide, Social Work [employer] Agency: [place] Date [place]: 01/13/22 Time [place]: 1459 Representative spoke with at [place]: Amy   Social Determinants of Health ([place]) Interventions    Readmission Risk Interventions     No data to display

## 2022-01-13 NOTE — Progress Notes (Signed)
Patient suffers from weakness and is not able to use a cane or walker for ambulation.  Patient is unable to self-propel in a manual chair due to Recent Stroke and unable to operate a POV or power chair.  Patient does have a caregiver who is willing and able to operate the transport chair.

## 2022-01-13 NOTE — Progress Notes (Signed)
Physical Therapy Treatment Patient Details Name: Gina [name] MRN: [id number] DOB: [birth year] Today's Date: 01/13/2022   History of Present Illness Pt is an 88 y/o F presenting to ED On 11/27 with generalized weakness and worsening tremors. Admitted for NSTEMI. Of note, pt with recent hospitalization 11/9-11/10 for small R precentral gyrus infarct. PMH includes CVA, HTN, HLD, gout, colon CA, prior GI bleed, tobacco abuse, PAD    PT Comments    The pt was agreeable to session with focus on mobility progression, reports limited tolerance this afternoon but agreeable to attempt mobility. The pt endorsed dizziness throughout that did not change with positions, BP remained stable 145-150/70-80 despite pt sx. The pt continues to need min-modA to complete sit-stand transfers and short bout of ambulation in the room due to poor LE strength, power, and posterior lean with initial stand. The pt had no instances of buckling, but did require increased time and repeated cues for hand positioning and technique to rise to standing. The pt was able to complete x2 step-ups with BUE support and modA, discussed the technique with family and use of gait belt for safety with return to home. Due to pt's poor activity tolerance and onset of nausea/vomiting after activity, session limited to transfer and stair training at this time. Will continue to benefit from skilled PT to progress mobility and independence with transfers to allow for safest possible return home.     Recommendations for follow up therapy are one component of a multi-disciplinary discharge planning process, led by the attending physician.  Recommendations may be updated based on patient status, additional functional criteria and insurance authorization.  Follow Up Recommendations  Home health PT     Assistance Recommended at Discharge Intermittent Supervision/Assistance  Patient can return home with the following A little help with walking and/or  transfers;A little help with bathing/dressing/bathroom;Assistance with cooking/housework;Assist for transportation;Help with stairs or ramp for entrance   Equipment Recommendations  BSC/3in1    Recommendations for Other Services       Precautions / Restrictions Precautions Precautions: Fall Precaution Comments: 2L O2 baseline; urinary incontinence Restrictions Weight Bearing Restrictions: No     Mobility  Bed Mobility Overal bed mobility: Needs Assistance Bed Mobility: Supine to Sit     Supine to sit: Min assist, HOB elevated     General bed mobility comments: minA to move LE to EOB and to elevate trunk, pt requesting HOB remain elevated to assist    Transfers Overall transfer level: Needs assistance Equipment used: Rolling walker (2 wheels) Transfers: Sit to/from Stand, Bed to chair/wheelchair/BSC Sit to Stand: Min assist, Mod assist   Step pivot transfers: Min assist       General transfer comment: pt with initial posterior lean and BLE bracing against bed or recliner. completed x6 sit-stands through session with cues for hand placement and posture each rep. small lateral steps with minA to steady    Ambulation/Gait Ambulation/Gait assistance: Min guard Gait Distance (Feet): 5 Feet Assistive device: Rolling walker (2 wheels) Gait Pattern/deviations: Step-through pattern, Decreased stride length, Trunk flexed Gait velocity: decreased Gait velocity interpretation: <1.31 ft/sec, indicative of household ambulator   General Gait Details: slowed steps with minimal clearance, no overt LOB but dependent on BUE support. poor endurance and therefore session focused on stair practice rather than gait progression   Stairs Stairs: Yes Stairs assistance: Mod assist Stair Management: Step to pattern, Forwards, With walker Number of Stairs: 2 General stair comments: step-to with use of RW, pt using RLE  leading. modA to power up to standing      Balance Overall balance  assessment: Needs assistance Sitting-balance support: Feet supported, No upper extremity supported Sitting balance-Leahy Scale: Good     Standing balance support: During functional activity, Reliant on assistive device for balance, Bilateral upper extremity supported Standing balance-Leahy Scale: Poor Standing balance comment: Requires UE support for walking                            Cognition Arousal/Alertness: Awake/alert Behavior During Therapy: WFL for tasks assessed/performed Overall Cognitive Status: Within Functional Limits for tasks assessed                                 General Comments: pt with flat affect, able to answer questions appropriately with minor delay.        Exercises General Exercises - Lower Extremity Long Arc Quad: AROM, Both, 10 reps, Seated Hip Flexion/Marching: AROM, Both, 10 reps, Seated    General Comments General comments (skin integrity, edema, etc.): VSS on 3L, pt incontinent of urine. family present and supportive, educated on stair training      Pertinent Vitals/Pain Pain Assessment Pain Assessment: No/denies pain     PT Goals (current goals can now be found in the care plan section) Acute Rehab PT Goals Patient Stated Goal: to get better PT Goal Formulation: With patient/family Time For Goal Achievement: 01/26/22 Potential to Achieve Goals: Good Progress towards PT goals: Progressing toward goals    Frequency    Min 3X/week      PT Plan Current plan remains appropriate       AM-PAC PT "6 Clicks" Mobility   Outcome Measure  Help needed turning from your back to your side while in a flat bed without using bedrails?: A Little Help needed moving from lying on your back to sitting on the side of a flat bed without using bedrails?: A Little Help needed moving to and from a bed to a chair (including a wheelchair)?: A Little Help needed standing up from a chair using your arms (e.g., wheelchair or  bedside chair)?: A Little Help needed to walk in hospital room?: A Little Help needed climbing 3-5 steps with a railing? : A Little 6 Click Score: 18    End of Session Equipment Utilized During Treatment: Gait belt Activity Tolerance: Patient tolerated treatment well Patient left: with family/visitor present;in chair;with call bell/phone within reach;with chair alarm set Nurse Communication: Mobility status PT Visit Diagnosis: Muscle weakness (generalized) (M62.81);Unsteadiness on feet (R26.81);Other abnormalities of gait and mobility (R26.89);Difficulty in walking, not elsewhere classified (R26.2)     Time: 1031-5945 PT Time Calculation (min) (ACUTE ONLY): 40 min  Charges:  $Gait Training: 8-22 mins $Therapeutic Exercise: 8-22 mins $Therapeutic Activity: 8-22 mins                     [place], PT, DPT   Acute Rehabilitation Department    [name] 01/13/2022, 3:23 PM

## 2022-01-13 NOTE — Progress Notes (Signed)
Initial Nutrition Assessment  DOCUMENTATION CODES:   Non-severe (moderate) malnutrition in context of acute illness/injury  INTERVENTION:  Liberalize diet from a heart healthy to a regular diet to provide widest variety of menu options to enhance nutritional adequacy Ensure Enlive po BID, each supplement provides 350 kcal and 20 grams of protein (chocolate of vanilla) Magic cup TID with meals, each supplement provides 290 kcal and 9 grams of protein  NUTRITION DIAGNOSIS:   Moderate Malnutrition related to acute illness (CVA, NSTEMI, FTT) as evidenced by energy intake < 75% for > 7 days, mild muscle depletion, moderate muscle depletion, mild fat depletion.  GOAL:   Patient will meet greater than or equal to 90% of their needs  MONITOR:   PO intake, Supplement acceptance, Labs, Weight trends  REASON FOR ASSESSMENT:   Consult Assessment of nutrition requirement/status  ASSESSMENT:   Pt admitted with FTT and NSTEMI. PMH significant for HTN, HLD, CVA, PAD, colon polyps, prior GIB, remote tobacco use, CKD stage 3b, CHF. Recently hospitalized 11/9-11/10 for L hand weakness 2/2 small R precentral gyrus infarct  D/t recent CVA pt now very weak and deconditioned with poor PO intake.  Spoke with pt and her family member at bedside. She states that she is feeling very weak. Pt reports that she has had a rapidly declining appetite and PO intake since last admission. She has been trying to sit down for mealtimes but eats very little as textures do not appeal to her and her taste has changed. "Nothing sounds good."   Pt reports having an episode of emesis this morning and endorses ongoing queasiness but has not taken medications to help with this. Pt's family member had brought an Orgain protein shake for pt to try which she had not yet tried. She is requesting protein supplements during admission. Will place order for Ensure BID.   Suspect pt is not exceeding any nutrient limits on her  current diet d/t poor PO intake. She would benefit from a liberalized diet at this time to allow for desirable options.   Takes calcium supplements and MVI at home.    Meal completions:  11/29: 90% breakfast, 75% lunch 11/30: 15% breakfast  Pt states that her weight has been rapidly declining. She endorses a 30 lb weight loss within the last 3 months, however states that some of this is d/t diuretic. Per review of weight history, her weight was documented to be 85.7 kg on 6/6 and has been trending down since then. Current weight is 70.9 kg. Uncertain what pt's dry weight is. It is likely her weight loss is a combination of fluid status versus true dry weight loss as she reports poor PO intake.   Edema: mild pitting BLE  Pt currently meets criteria for acute malnutrition as her reports of rapid decline in PO intake began within the last month since last admission. Unable to confirm how much weight loss is attributed to true dry weight loss. It is likely she has underlying chronic malnutrition.   Medications include IV D5 in NaCl @ 35ml/hr  Labs: Ca 10.4, Mg 1.6  NUTRITION - FOCUSED PHYSICAL EXAM:  Flowsheet Row Most Recent Value  Orbital Region Mild depletion  Upper Arm Region No depletion  Thoracic and Lumbar Region No depletion  Buccal Region Mild depletion  Temple Region Moderate depletion  Clavicle Bone Region Mild depletion  Clavicle and Acromion Bone Region Mild depletion  Scapular Bone Region No depletion  Dorsal Hand Mild depletion  Patellar Region Mild depletion  Anterior Thigh Region Mild depletion  Posterior Calf Region Moderate depletion  Edema (RD Assessment) Mild  [BLE]  Hair Reviewed  Eyes Reviewed  Mouth Reviewed  Skin Reviewed  Nails Reviewed       Diet Order:   Diet Order             Diet regular Room service appropriate? Yes; Fluid consistency: Thin  Diet effective now                   EDUCATION NEEDS:   Education needs have been  addressed  Skin:  Skin Assessment: Reviewed RN Assessment  Last BM:  11/27  Height:   Ht Readings from Last 1 Encounters:  01/10/22 5' (1.524 m)    Weight:   Wt Readings from Last 1 Encounters:  01/13/22 70.9 kg    Ideal Body Weight:  45.5 kg  BMI:  Body mass index is 30.53 kg/m.  Estimated Nutritional Needs:   Kcal:  1400-1600  Protein:  70-85g  Fluid:  >/=1.5L  Clayborne Dana, RDN, LDN Clinical Nutrition

## 2022-01-14 ENCOUNTER — Other Ambulatory Visit (HOSPITAL_COMMUNITY): Payer: Self-pay

## 2022-01-14 DIAGNOSIS — R7989 Other specified abnormal findings of blood chemistry: Secondary | ICD-10-CM | POA: Diagnosis not present

## 2022-01-14 DIAGNOSIS — Z8673 Personal history of transient ischemic attack (TIA), and cerebral infarction without residual deficits: Secondary | ICD-10-CM | POA: Diagnosis not present

## 2022-01-14 DIAGNOSIS — J9611 Chronic respiratory failure with hypoxia: Secondary | ICD-10-CM | POA: Diagnosis not present

## 2022-01-14 DIAGNOSIS — I5032 Chronic diastolic (congestive) heart failure: Secondary | ICD-10-CM | POA: Diagnosis not present

## 2022-01-14 DIAGNOSIS — E440 Moderate protein-calorie malnutrition: Secondary | ICD-10-CM

## 2022-01-14 LAB — BASIC METABOLIC PANEL
Anion gap: 14 (ref 5–15)
BUN: 17 mg/dL (ref 8–23)
CO2: 26 mmol/L (ref 22–32)
Calcium: 9.8 mg/dL (ref 8.9–10.3)
Chloride: 101 mmol/L (ref 98–111)
Creatinine, Ser: 0.65 mg/dL (ref 0.44–1.00)
GFR, Estimated: >60 mL/min (ref >60)
Glucose, Bld: 127 mg/dL — ABNORMAL HIGH (ref 70–99)
Potassium: 3.6 mmol/L (ref 3.5–5.1)
Sodium: 141 mmol/L (ref 135–145)

## 2022-01-14 LAB — MAGNESIUM: Magnesium: 2.1 mg/dL (ref 1.7–2.4)

## 2022-01-14 MED ORDER — AMLODIPINE BESYLATE 10 MG PO TABS
10.0000 mg | ORAL_TABLET | Freq: Every day | ORAL | Status: DC
  Administered 2022-01-14 – 2022-01-15 (×2): 10 mg via ORAL
  Filled 2022-01-14 (×2): qty 1

## 2022-01-14 MED ORDER — AMLODIPINE BESYLATE 10 MG PO TABS
10.0000 mg | ORAL_TABLET | Freq: Every day | ORAL | 0 refills | Status: AC
  Filled 2022-01-14: qty 30, 30d supply, fill #0

## 2022-01-14 MED ORDER — POTASSIUM CHLORIDE CRYS ER 20 MEQ PO TBCR
40.0000 meq | EXTENDED_RELEASE_TABLET | Freq: Once | ORAL | Status: AC
  Administered 2022-01-14: 40 meq via ORAL
  Filled 2022-01-14: qty 2

## 2022-01-14 MED ORDER — TORSEMIDE 20 MG PO TABS: 20.0000 mg | ORAL_TABLET | Freq: Every day | ORAL | 5 refills | Status: AC | PRN

## 2022-01-14 MED ORDER — ENSURE ENLIVE PO LIQD
237.0000 mL | Freq: Two times a day (BID) | ORAL | 0 refills | Status: AC
  Filled 2022-01-14: qty 14220, 30d supply, fill #0

## 2022-01-14 MED ORDER — TORSEMIDE 20 MG PO TABS
20.0000 mg | ORAL_TABLET | Freq: Every day | ORAL | 5 refills | Status: DC
  Filled 2022-01-14: qty 60, 60d supply, fill #0

## 2022-01-14 NOTE — Assessment & Plan Note (Signed)
Patient has been placed on nutritional supplementation.

## 2022-01-14 NOTE — Progress Notes (Signed)
    Durable Medical Equipment  (From admission, onward)           Start     Ordered   01/13/22 1622  For home use only DME Other see comment  Once       Comments: Transport chair  Question:  Length of Need  Answer:  Lifetime   01/13/22 1622   01/13/22 1408  For home use only DME Hospital bed  Once       Question Answer Comment  Length of Need 12 Months   Head must be elevated greater than: 45 degrees   Bed type Semi-electric      01/13/22 1407   01/12/22 1043  For home use only DME 3 n 1  Once        01/12/22 1042

## 2022-01-14 NOTE — Progress Notes (Signed)
Physical Therapy Treatment Patient Details Name: Gina [name] MRN: [id number] DOB: 16-Jun-1931 Today's Date: 01/14/2022   History of Present Illness Pt is an 88 y/o F presenting to ED On 11/27 with generalized weakness and worsening tremors. Admitted for NSTEMI. Of note, pt with recent hospitalization 11/9-11/10 for small R precentral gyrus infarct. PMH includes CVA, HTN, HLD, gout, colon CA, prior GI bleed, tobacco abuse, PAD    PT Comments    The pt was agreeable to session with focus on gait progression this afternoon. Pt continues to be limited initially by urinary incontinence with standing, and requires modA to manage standing balance due to strong posterior lean with initial stand. With cues and tactile facilitation, pt able to correct posture and reduce lean but remains dependent on at least minA to maintain upright. Pt able to complete 39ft + 52ft ambulation with seated rest between to recover. SpO2 to low of 88% but recovered quickly to 90s on 3L. The pt and family were again educated on energy conservation and need for seated rest throughout house to maintain safe mobility with activity at home.     Recommendations for follow up therapy are one component of a multi-disciplinary discharge planning process, led by the attending physician.  Recommendations may be updated based on patient status, additional functional criteria and insurance authorization.  Follow Up Recommendations  Home health PT     Assistance Recommended at Discharge Intermittent Supervision/Assistance  Patient can return home with the following A little help with walking and/or transfers;A little help with bathing/dressing/bathroom;Assistance with cooking/housework;Assist for transportation;Help with stairs or ramp for entrance   Equipment Recommendations  BSC/3in1;Wheelchair (measurements PT);Wheelchair cushion (measurements PT)    Recommendations for Other Services       Precautions / Restrictions  Precautions Precautions: Fall Precaution Comments: 2L O2 baseline, on 3 for session; urinary incontinence Restrictions Weight Bearing Restrictions: No     Mobility  Bed Mobility Overal bed mobility: Needs Assistance Bed Mobility: Supine to Sit     Supine to sit: Min assist, HOB elevated     General bed mobility comments: minA to move LE to EOB and to elevate trunk, pt requesting HOB remain elevated to assist    Transfers Overall transfer level: Needs assistance Equipment used: Rolling walker (2 wheels) Transfers: Sit to/from Stand, Bed to chair/wheelchair/BSC Sit to Stand: Min assist, Mod assist   Step pivot transfers: Min assist       General transfer comment: pt with initial posterior lean and BLE bracing against bed or recliner. completed x6 sit-stands through session with cues for hand placement and posture each rep. small lateral steps with minA to steady    Ambulation/Gait Ambulation/Gait assistance: Min assist, +2 safety/equipment (chair follow) Gait Distance (Feet): 10 Feet (+ 12 ft) Assistive device: Rolling walker (2 wheels) Gait Pattern/deviations: Decreased stride length, Trunk flexed, Step-to pattern, Shuffle Gait velocity: decreased Gait velocity interpretation: <1.31 ft/sec, indicative of household ambulator   General Gait Details: pt with significantly flexed stance, dependent on BUE support, minimal clearance and stride length. no overt LOB but leans posteriorly and needs minA to encourage upright posture and to steady       Balance Overall balance assessment: Needs assistance Sitting-balance support: Feet supported, No upper extremity supported Sitting balance-Leahy Scale: Good     Standing balance support: During functional activity, Reliant on assistive device for balance, Bilateral upper extremity supported Standing balance-Leahy Scale: Poor Standing balance comment: Requires UE support for walking  Cognition Arousal/Alertness: Awake/alert Behavior During Therapy: WFL for tasks assessed/performed Overall Cognitive Status: Within Functional Limits for tasks assessed                                 General Comments: pt with flat affect, able to answer questions appropriately with minor delay.        Exercises      General Comments General comments (skin integrity, edema, etc.): SpO2 to 87% on 2L, improved to 90s with 3L. maintained >88% on 3L with gait      Pertinent Vitals/Pain Pain Assessment Pain Assessment: No/denies pain     PT Goals (current goals can now be found in the care plan section) Acute Rehab PT Goals Patient Stated Goal: to get better PT Goal Formulation: With patient/family Time For Goal Achievement: 01/26/22 Potential to Achieve Goals: Good Progress towards PT goals: Progressing toward goals    Frequency    Min 3X/week      PT Plan Current plan remains appropriate       AM-PAC PT "6 Clicks" Mobility   Outcome Measure  Help needed turning from your back to your side while in a flat bed without using bedrails?: A Little Help needed moving from lying on your back to sitting on the side of a flat bed without using bedrails?: A Little Help needed moving to and from a bed to a chair (including a wheelchair)?: A Little Help needed standing up from a chair using your arms (e.g., wheelchair or bedside chair)?: A Little Help needed to walk in hospital room?: A Little Help needed climbing 3-5 steps with a railing? : A Little 6 Click Score: 18    End of Session Equipment Utilized During Treatment: Gait belt Activity Tolerance: Patient tolerated treatment well Patient left: with family/visitor present;in chair;with call bell/phone within reach;with chair alarm set Nurse Communication: Mobility status PT Visit Diagnosis: Muscle weakness (generalized) (M62.81);Unsteadiness on feet (R26.81);Other abnormalities of gait and mobility  (R26.89);Difficulty in walking, not elsewhere classified (R26.2)     Time: 2080-2233 PT Time Calculation (min) (ACUTE ONLY): 34 min  Charges:  $Therapeutic Exercise: 8-22 mins $Therapeutic Activity: 8-22 mins                     Gina [name], PT, DPT   Acute Rehabilitation Department   Gina [name] 01/14/2022, 1:03 PM

## 2022-01-14 NOTE — TOC Transition Note (Signed)
Transition of Care [facility]) - CM/SW Discharge Note   Patient Details  Name: KEYANDRA SWENSON MRN: [id number] Date of Birth: 31-Jul-1931  [facility] CM/SW Contact:  Zenon Mayo, RN Phone Number: 01/14/2022, 2:04 PM   Clinical Narrative:    Patient is for dc on 12/2, she is set up with [facility], [employer] has delivered the DME on 12/1.  Clair Gulling, patient's son an POA will transport her home by car in am.     Final next level of care: [place] Barriers to Discharge: Continued Medical Work up   Patient Goals and CMS Choice Patient states their goals for this hospitalization and ongoing recovery are:: return home CMS Medicare.gov Compare Post Acute Care list provided to:: Patient Represenative (must comment) (son who is POA) Choice offered to / list presented to : [place] / [place]  Discharge Placement                       Discharge Plan and Services In-house Referral: NA Discharge Planning Services: CM Consult Post Acute Care Choice: Home Health          DME Arranged: Bedside commode, Hospital bed DME Agency: [employer] Date DME Agency Contacted: 01/13/22 Time DME Agency Contacted: [id number] Representative spoke with at DME Agency: Ulice Dash HH Arranged: RN, Disease Management, PT, OT, Nurse's Aide, Social Work [employer] Agency: Lena Date [place]: 01/13/22 Time [place]: [place] Representative spoke with at [place]: Amy  Social Determinants of Health ([place]) Interventions     Readmission Risk Interventions     No data to display

## 2022-01-14 NOTE — Discharge Summary (Addendum)
Physician Discharge Summary   Patient: Gina [name] MRN: [id number] DOB: March 25, 1931  Admit date:     01/10/2022  Discharge date: 01/15/22  Discharge Physician: York Ram Paytyn Mesta   PCP: Tresa Garter, MD   Recommendations at discharge:    Instructed to discontinue thiazide diuretic and change torsemide to as needed for signs of volume overload, weight gain 2 to 3 lbs in 24 hrs or 5 lbs in 7 days. Follow up renal function and electrolytes in 7 days. Increased amlodipine to 10 mg daily.  Follow up with Dr Posey Rea in 7 to 10 days. Continue home health services.   Discharge Diagnoses: Principal Problem:   Elevated troponin Active Problems:   Chronic respiratory failure with hypoxia (HCC)   History of CVA (cerebrovascular accident) without residual deficits   Chronic diastolic CHF (congestive heart failure) (HCC)   Spinal stenosis   Essential hypertension   Chronic kidney disease, stage 3b (HCC)   Malnutrition of moderate degree  Resolved Problems:   * No resolved hospital problems. [facility] Course: Mr. Loughnane was admitted to the hospital with the working diagnosis of dehydration complicated with elevated troponin.   88 yo female with the past medical history if hypertension, dyslipidemia, peripheral vascular disease and GI bleed who presented with weakness and worsening tremors. Recent hospitalization 11/9 to 12/24/21 for acute ischemic CVA, right precentral gyrus. She was placed on ticagrelor and loop recorder was placed. At home she continue to have difficulty using her left hand, had decreased appetite, dyspnea and generalized fatigue that prompted her to come back to the hospital. On her initial physical examination she was hypoxemic and required supplemental 02 per Bacon, blood pressure 139/58, HR 83, RR 20 and 02 saturation 92%, lungs with mild decreased breath sounds at bases, heart with S1 and S2 present and rhythmic, no gallops, abdomen with no distention,  positive lower extremity edema pitting.   Na 138, K 4,4 Cl 97 bicarbonate 30 glucose 118, bun 40 cr 1,25 High sensitive troponin 255 and 302  Wbc 9,2 hgb 15.0 plt 118  Sars covid 19 negative   Chest radiograph with cardiomegaly with mild hilar vascular congestion, and small bilateral pleural effusions.   EKG 97 bpm, right axis deviation, normal intervals, sinus rhythm with no significant ST segment or T wave changes.   CT chest with no pulmonary embolism. Positive 11 mm irregular left upper lobe pulmonary nodule.   11/30 Patient has ruled out for acute coronary syndrome, clinically improving with supportive medical care. 12/01 renal function and electrolytes are stable, patient will need home health services.  Check ambulatory oxymetry on room air.  Follow up as outpatient.   Assessment and Plan: * Elevated troponin Positive troponin elevation, no chest pain,  No EKG changes suggesting ischemia Echocardiogram with preserved LV systolic function with no wall motion abnormalities.   At this point ruled out acute coronary syndrome.   Chronic respiratory failure with hypoxia (HCC) COPD with no clinical sings of exacerbation.   Continue supplemental 02 per [place]orne.  Patient with no worsening respiratory failure, she ruled out for pulmonary infection and pulmonary embolism.  Continue supplemental 02 per [place] at home to keep 02 saturation 92% or greater.  Continue with bronchodilator therapy.   History of CVA (cerebrovascular accident) without residual deficits Recent CVA, patient very weak and deconditioned, poor oral intake, Patient has been placed on nutritional supplements. No focal neurologic deficit.  Continue antiplatelet therapy, statin and blood pressure control. Continue with home PT  and OT.   Chronic diastolic CHF (congestive heart failure) (HCC) No clinical signs of exacerbation  Patient was placed on IV fluids with good toleration.  At the time of her discharge will  continue blood pressure monitoring.  Loop diuretic therapy only as needed.  Hold on thiazide diuretic to prevent dehydration and electrolyte abnormalities.   Spinal stenosis Continue pain control, Pt and OT  Essential hypertension Continue blood pressure control with amlodipine and losartan.  Blood pressure has been 130 to 160 mmHg.  Hold on diuretic therapy and increase dose of amlodipine to 10 mg.   Chronic kidney disease, stage 3b (HCC) Hyponatremia. Hypomagnesemia   Patient received isotonic saline with good toleration, her Mag was corrected with IV mag sulfate.  At the time of her discharge her renal function had a serum cr of 0.65, with K at 3,6 and serum bicarbonate at 26.  Mg 2,1. Will give Kcl 40 meq before discharge. Continue holding diuretic therapy. Follow up renal function and electrolytes as outpatient.   Malnutrition of moderate degree Patient has been placed on nutritional supplementation.          Consultants: cardiology  Procedures performed: none   Disposition: Home Diet recommendation:  Cardiac diet DISCHARGE MEDICATION: Allergies as of 01/14/2022       Reactions   Penicillins Hives   Has patient had a PCN reaction causing immediate rash, facial/tongue/throat swelling, SOB or lightheadedness with hypotension:## yes ## Has patient had a PCN reaction causing severe rash involving mucus membranes or skin necrosis: no Has patient had a PCN reaction that required hospitalization: no Has patient had a PCN reaction occurring within the last 10 years: no If all of the above answers are "NO", then may proceed with Cephalosporin use.   Aspirin Hives   Enalapril Other (See Comments), Cough   Wheezing, also   Flonase [fluticasone] Other (See Comments)   Nose bleeds   Adhesive [tape] Other (See Comments)   "pulls skin off"        Medication List     STOP taking these medications    multivitamin with minerals Tabs tablet    triamterene-hydrochlorothiazide 37.5-25 MG tablet Commonly known as: MAXZIDE-25       TAKE these medications    acetaminophen 500 MG tablet Commonly known as: TYLENOL Take 500-1,000 mg by mouth See admin instructions. Take 1,000 mg by mouth in the morning, 500 mg at 4 PM, and 500 mg at bedtime   allopurinol 100 MG tablet Commonly known as: ZYLOPRIM TAKE 1 TABLET DAILY What changed: when to take this   amLODipine 10 MG tablet Commonly known as: NORVASC Take 1 tablet (10 mg total) by mouth daily. What changed:  medication strength how much to take   ascorbic acid 500 MG tablet Commonly known as: VITAMIN C Take 500 mg by mouth every evening.   atorvastatin 40 MG tablet Commonly known as: LIPITOR Take 1 tablet (40 mg total) by mouth every evening.   CALCIUM 600 PO Take 600 mg by mouth in the morning and at bedtime.   cetirizine 10 MG tablet Commonly known as: ZYRTEC Take 1 tablet (10 mg total) by mouth 2 (two) times daily. What changed: when to take this   feeding supplement Liqd Take 237 mLs by mouth 2 (two) times daily between meals.   gabapentin 400 MG capsule Commonly known as: NEURONTIN TAKE 1 CAPSULE 4 TIMES     DAILY What changed: See the new instructions.   losartan 50 MG tablet  Commonly known as: COZAAR TAKE 1 TABLET DAILY What changed: when to take this   mupirocin ointment 2 % Commonly known as: BACTROBAN Apply 1 Application topically daily as needed (for irritation- affected areas).   OXYGEN Inhale 2 L/min into the lungs at bedtime.   PRESERVISION AREDS 2 PO Take 1 capsule by mouth in the morning.   sodium chloride 0.65 % Soln nasal spray Commonly known as: OCEAN Place 1 spray into both nostrils as needed for congestion.   ticagrelor 90 MG Tabs tablet Commonly known as: BRILINTA Take 1 tablet (90 mg total) by mouth 2 (two) times daily.   torsemide 20 MG tablet Commonly known as: DEMADEX Take 1 tablet (20 mg total) by mouth daily as  needed. As needed in case of weight gain 2 to 3 lbs in 24 hrs or 5 lbs in 7 days. What changed:  how much to take when to take this reasons to take this additional instructions   Trelegy Ellipta 100-62.5-25 MCG/ACT Aepb Generic drug: Fluticasone-Umeclidin-Vilant USE 1 INHALATION ORALLY    DAILY What changed: See the new instructions.               Durable Medical Equipment  (From admission, onward)           Start     Ordered   01/13/22 1622  For home use only DME Other see comment  Once       Comments: Transport chair  Question:  Length of Need  Answer:  Lifetime   01/13/22 1622   01/13/22 1408  For home use only DME Hospital bed  Once       Question Answer Comment  Length of Need 12 Months   Head must be elevated greater than: 45 degrees   Bed type Semi-electric      01/13/22 1407   01/12/22 1043  For home use only DME 3 n 1  Once        01/12/22 1042            Follow-up Information     [facility] Follow up.   Why: Hospital bed, [facility]- will deliver to home address Contact information: [address] [place] [address] [phone]         [facility] Follow up.   Why: Agency will contact you to set up apt times Contact information: [address] [place] [address] [phone]                Discharge Exam: Filed Weights   01/12/22 0549 01/13/22 0307 01/14/22 0438  Weight: 70.7 kg 70.9 kg 70.7 kg   BP (!) 152/74 (BP Location: Left Arm)   Pulse 91   Temp 98 F (36.7 C) (Oral)   Resp 19   Ht 5' (1.524 m)   Wt 70.7 kg   SpO2 93%   BMI 30.44 kg/m   Patient with no chest pain or dyspnea, her nausea has been improving.  Neurology awake and alert ENT with mild pallor Cardiovascular with S1 and S2 present and rhythmic with no gallops, rubs or murmurs. Respiratory with no rales or wheezing, no rhonchi Abdomen with no distention  No lower extremity edema   Condition at discharge:  stable  The results of significant diagnostics from this hospitalization (including imaging, microbiology, ancillary and laboratory) are listed below for reference.   Imaging Studies: CT Angio Chest Pulmonary Embolism (PE) W or WO Contrast  Result Date: 01/11/2022  CLINICAL DATA:  Weakness worsening tremors EXAM: CT ANGIOGRAPHY CHEST WITH CONTRAST TECHNIQUE: Multidetector CT imaging of the chest was performed using the standard protocol during bolus administration of intravenous contrast. Multiplanar CT image reconstructions and MIPs were obtained to evaluate the vascular anatomy. RADIATION DOSE REDUCTION: This exam was performed according to the departmental dose-optimization program which includes automated exposure control, adjustment of the mA and/or kV according to patient size and/or use of iterative reconstruction technique. CONTRAST:  75mL OMNIPAQUE IOHEXOL 350 MG/ML SOLN COMPARISON:  Chest x-ray 01/10/2022, CT chest 05/06/2015 FINDINGS: Cardiovascular: Satisfactory opacification of the pulmonary arteries to the segmental level. No evidence of pulmonary embolism. Nonaneurysmal aorta. Severe aortic atherosclerosis. Dilated pulmonary trunk measuring up to 3.7 cm. Coronary vascular calcification. Cardiomegaly. No pericardial effusion Mediastinum/Nodes: Midline trachea. No thyroid mass. Borderline mediastinal lymph nodes. Right paratracheal nodes up to 9 mm. Left paratracheal nodes up to 10 mm. Esophagus within normal limits. Lungs/Pleura: Probable subpleural atelectasis in the posterior lung bases. New irregular left upper lobe pulmonary nodule measuring 11 mm, series 6, image 67. Additional small pulmonary nodules. For example right upper lobe anterior subpleural 3 mm nodule, series 6, image 47. 4 mm left superior left lower lobe pulmonary nodule series 6, image 53. Upper Abdomen: Small ascites adjacent to the liver. No acute finding in the upper abdomen. Musculoskeletal: Acute appearing mildly  displaced left third, fourth, and fifth anterior rib fractures. Multilevel degenerative changes of the spine. Review of the MIP images confirms the above findings. IMPRESSION: 1. Negative for acute pulmonary embolus. 2. Acute appearing mildly displaced left third, fourth, and fifth anterior rib fractures. 3. Cardiomegaly. Dilated pulmonary trunk suggesting pulmonary arterial hypertension. 4. New irregular 11 mm left upper lobe pulmonary nodule which could be infectious, inflammatory or neoplastic in etiology. Consider one of the following in 3 months for both low-risk and high-risk individuals: (a) repeat chest CT, (b) follow-up PET-CT, or (c) tissue sampling. This recommendation follows the consensus statement: Guidelines for Management of Incidental Pulmonary Nodules Detected on CT Images: From the Fleischner Society 2017; Radiology 2017; 284:228-243. Additional small bilateral pulmonary nodules are present and may also be reassessed at 3 month CT follow-up. 5. Aortic atherosclerosis. Aortic Atherosclerosis (ICD10-I70.0). Electronically Signed   By: Jasmine Pang M.D.   On: 01/11/2022 23:17   ECHOCARDIOGRAM COMPLETE  Result Date: 01/11/2022    ECHOCARDIOGRAM REPORT   Patient Name:   NESLY MERRIFIELD Date of Exam: 01/11/2022 Medical Rec #:  [id number]         Height:       60.0 in Accession #:    [id number]        Weight:       163.1 lb Date of Birth:  [birth year]         BSA:          1.712 m Patient Age:    89 years          BP:           111/64 mmHg Patient Gender: F                 HR:           94 bpm. Exam Location:  Inpatient Procedure: 2D Echo, Cardiac Doppler and Color Doppler Indications:    chest pain  History:        Patient has prior history of Echocardiogram examinations, most                 recent  12/24/2021. CHF, PAD; Risk Factors:Hypertension and                 Dyslipidemia.  Sonographer:    Melissa Morford RDCS (AE, PE) Referring Phys: [phone] RONDELL A SMITH IMPRESSIONS  1. Left ventricular  ejection fraction, by estimation, is 55 to 60%. The left ventricle has normal function. The left ventricle has no regional wall motion abnormalities. Left ventricular diastolic parameters are consistent with Grade I diastolic dysfunction (impaired relaxation). Elevated left atrial pressure.  2. Right ventricular systolic function is mildly reduced. The right ventricular size is moderately enlarged. Moderately increased right ventricular wall thickness. There is severely elevated pulmonary artery systolic pressure. The estimated right ventricular systolic pressure is 62.5 mmHg.  3. Left atrial size was mildly dilated.  4. Right atrial size was mildly dilated.  5. A small pericardial effusion is present. The pericardial effusion is posterior to the left ventricle.  6. The mitral valve is degenerative. No evidence of mitral valve regurgitation. No evidence of mitral stenosis. Moderate mitral annular calcification.  7. The aortic valve is tricuspid. There is mild calcification of the aortic valve. There is moderate thickening of the aortic valve. Aortic valve regurgitation is not visualized. Aortic valve sclerosis/calcification is present, without any evidence of aortic stenosis.  8. The inferior vena cava is normal in size with <50% respiratory variability, suggesting right atrial pressure of 8 mmHg. Comparison(s): Prior images reviewed side by side. Pulmonary artery hypertension is worse. FINDINGS  Left Ventricle: Left ventricular ejection fraction, by estimation, is 55 to 60%. The left ventricle has normal function. The left ventricle has no regional wall motion abnormalities. The left ventricular internal cavity size was normal in size. There is  no left ventricular hypertrophy. Left ventricular diastolic parameters are consistent with Grade I diastolic dysfunction (impaired relaxation). Elevated left atrial pressure. Right Ventricle: The right ventricular size is moderately enlarged. Moderately increased right  ventricular wall thickness. Right ventricular systolic function is mildly reduced. There is severely elevated pulmonary artery systolic pressure. The tricuspid  regurgitant velocity is 3.69 m/s, and with an assumed right atrial pressure of 8 mmHg, the estimated right ventricular systolic pressure is 62.5 mmHg. Left Atrium: Left atrial size was mildly dilated. Right Atrium: Right atrial size was mildly dilated. Pericardium: A small pericardial effusion is present. The pericardial effusion is posterior to the left ventricle. Mitral Valve: The mitral valve is degenerative in appearance. Moderate mitral annular calcification. No evidence of mitral valve regurgitation. No evidence of mitral valve stenosis. Tricuspid Valve: The tricuspid valve is normal in structure. Tricuspid valve regurgitation is not demonstrated. Aortic Valve: The aortic valve is tricuspid. There is mild calcification of the aortic valve. There is moderate thickening of the aortic valve. Aortic valve regurgitation is not visualized. Aortic valve sclerosis/calcification is present, without any evidence of aortic stenosis. Aortic valve mean gradient measures 7.0 mmHg. Aortic valve peak gradient measures 11.4 mmHg. Aortic valve area, by VTI measures 2.12 cm. Pulmonic Valve: The pulmonic valve was normal in structure. Pulmonic valve regurgitation is not visualized. Aorta: The aortic root and ascending aorta are structurally normal, with no evidence of dilitation. Venous: The inferior vena cava is normal in size with less than 50% respiratory variability, suggesting right atrial pressure of 8 mmHg. IAS/Shunts: No atrial level shunt detected by color flow Doppler.  LEFT VENTRICLE PLAX 2D LVIDd:         4.50 cm   Diastology LVIDs:         3.10 cm  LV e' medial:    3.81 cm/s LV PW:         1.00 cm   LV E/e' medial:  21.7 LV IVS:        1.00 cm   LV e' lateral:   5.00 cm/s LVOT diam:     2.13 cm   LV E/e' lateral: 16.5 LV SV:         67 LV SV Index:   39  LVOT Area:     3.56 cm  RIGHT VENTRICLE RV S prime:     16.50 cm/s TAPSE (M-mode): 1.8 cm LEFT ATRIUM             Index        RIGHT ATRIUM           Index LA diam:        4.00 cm 2.34 cm/m   RA Area:     19.20 cm LA Vol (A2C):   48.4 ml 28.27 ml/m  RA Volume:   54.20 ml  31.66 ml/m LA Vol (A4C):   53.0 ml 30.96 ml/m LA Biplane Vol: 55.3 ml 32.31 ml/m  AORTIC VALVE AV Area (Vmax):    2.04 cm AV Area (Vmean):   1.86 cm AV Area (VTI):     2.12 cm AV Vmax:           169.00 cm/s AV Vmean:          120.000 cm/s AV VTI:            0.316 m AV Peak Grad:      11.4 mmHg AV Mean Grad:      7.0 mmHg LVOT Vmax:         96.90 cm/s LVOT Vmean:        62.700 cm/s LVOT VTI:          0.188 m LVOT/AV VTI ratio: 0.59  AORTA Ao Root diam: 3.00 cm Ao Asc diam:  2.90 cm MITRAL VALVE                TRICUSPID VALVE MV Area (PHT): 9.60 cm     TR Peak grad:   54.5 mmHg MV Decel Time: 79 msec      TR Vmax:        369.00 cm/s MV E velocity: 82.50 cm/s MV A velocity: 159.00 cm/s  SHUNTS MV E/A ratio:  0.52         Systemic VTI:  0.19 m                             Systemic Diam: 2.13 cm Rachelle Hora Croitoru MD Electronically signed by Thurmon Fair MD Signature Date/Time: 01/11/2022/3:12:49 PM    Final    CT Head Wo Contrast  Result Date: 01/11/2022 CLINICAL DATA:  Weakness and reduced appetite. EXAM: CT HEAD WITHOUT CONTRAST TECHNIQUE: Contiguous axial images were obtained from the base of the skull through the vertex without intravenous contrast. RADIATION DOSE REDUCTION: This exam was performed according to the departmental dose-optimization program which includes automated exposure control, adjustment of the mA and/or kV according to patient size and/or use of iterative reconstruction technique. COMPARISON:  December 23, 2021 FINDINGS: Brain: There is mild cerebral atrophy with widening of the extra-axial spaces and ventricular dilatation. There are areas of decreased attenuation within the white matter tracts of the supratentorial  brain, consistent with microvascular disease changes. Vascular: There is marked severity calcification of the bilateral cavernous  carotid arteries. Skull: Normal. Negative for fracture or focal lesion. Sinuses/Orbits: A 9 mm x 9 mm left maxillary sinus polyp versus mucous retention cyst is seen. Other: None. IMPRESSION: 1. No acute intracranial abnormality. 2. Generalized cerebral atrophy with chronic white matter small vessel ischemic changes. Electronically Signed   By: Aram Candela M.D.   On: 01/11/2022 00:49   DG Chest 2 View  Result Date: 01/10/2022 CLINICAL DATA:  Weakness EXAM: CHEST - 2 VIEW COMPARISON:  12/23/2021, CT chest 05/06/2015 FINDINGS: Cardiomegaly with mild central congestion. Probable small right effusion. No focal consolidation. Aortic atherosclerosis. No pneumothorax. Electronic device over left lower chest. IMPRESSION: Cardiomegaly with mild central congestion and probable small right effusion. Electronically Signed   By: Jasmine Pang M.D.   On: 01/10/2022 23:42   MR LUMBAR SPINE WO CONTRAST  Result Date: 01/10/2022 CLINICAL DATA:  Lumbar radiculopathy EXAM: MRI LUMBAR SPINE WITHOUT CONTRAST TECHNIQUE: Multiplanar, multisequence MR imaging of the lumbar spine was performed. No intravenous contrast was administered. COMPARISON:  01/10/2018 FINDINGS: Segmentation:  Standard. Alignment: Grade 1 retrolisthesis at L3-4 and grade 1 anterolisthesis at L4-5 Vertebrae:  No fracture, evidence of discitis, or bone lesion. Conus medullaris and cauda equina: Conus extends to the L2 level. Conus and cauda equina appear normal. Paraspinal and other soft tissues: Negative Disc levels: L1-L2: Normal disc space and facet joints. No spinal canal stenosis. No neural foraminal stenosis. L2-L3: Small disc bulge. Progression of moderate spinal canal stenosis. Mild bilateral neural foraminal stenosis. L3-L4: Unchanged small disc bulge. Left lateral recess narrowing without central spinal canal  stenosis. Mild right and moderate left neural foraminal stenosis. L4-L5: Moderate facet arthrosis with disc uncovering and small central extrusion with superior migration. Mild spinal canal stenosis. Mild right and moderate left neural foraminal stenosis. L5-S1: Small disc bulge and moderate facet hypertrophy. No spinal canal stenosis. Progression of severe right and moderate left neural foraminal stenosis. Visualized sacrum: Normal. IMPRESSION: 1. Progression of moderate L2-3 spinal canal stenosis. 2. Progression of severe right and moderate left L5-S1 neural foraminal stenosis. 3. Unchanged mild spinal canal stenosis and moderate left neural foraminal stenosis at L4-L5. 4. Moderate left L3-4 neural foraminal stenosis. Electronically Signed   By: Deatra Robinson M.D.   On: 01/10/2022 21:21   VAS US CAROTID  Result Date: 12/27/2021 Carotid Arterial Duplex Study Patient Name:  MELESIA AUDET  Date of Exam:   12/24/2021 Medical Rec #: [id number]          Accession #:    [id number] Date of Birth: [birth year]          Patient Gender: F Patient Age:   32 years Exam Location:  [facility] Procedure:      VAS US CAROTID Referring Phys: Brooke Dare --------------------------------------------------------------------------------  Indications:       CVA. Risk Factors:      Hypertension, hyperlipidemia, prior CVA, PAD. Comparison Study:  no prior Performing Technologist: Argentina Ponder RVS  Examination Guidelines: A complete evaluation includes B-mode imaging, spectral Doppler, color Doppler, and power Doppler as needed of all accessible portions of each vessel. Bilateral testing is considered an integral part of a complete examination. Limited examinations for reoccurring indications may be performed as noted.  Right Carotid Findings: +----------+--------+--------+--------+------------------+--------+           PSV cm/sEDV cm/sStenosisPlaque DescriptionComments  +----------+--------+--------+--------+------------------+--------+ CCA Prox  67      11              heterogenous               +----------+--------+--------+--------+------------------+--------+  CCA Distal50      13              heterogenous               +----------+--------+--------+--------+------------------+--------+ ICA Prox  99      18      1-39%   heterogenous               +----------+--------+--------+--------+------------------+--------+ ICA Distal80      22                                         +----------+--------+--------+--------+------------------+--------+ ECA       117                                                +----------+--------+--------+--------+------------------+--------+ +----------+--------+-------+--------+-------------------+           PSV cm/sEDV cmsDescribeArm Pressure (mmHG) +----------+--------+-------+--------+-------------------+ [id number]                                         +----------+--------+-------+--------+-------------------+ +---------+--------+--+--------+--+---------+ VertebralPSV cm/s64EDV cm/s12Antegrade +---------+--------+--+--------+--+---------+  Left Carotid Findings: +----------+--------+--------+--------+------------------+--------+           PSV cm/sEDV cm/sStenosisPlaque DescriptionComments +----------+--------+--------+--------+------------------+--------+ CCA Prox  83      13              heterogenous               +----------+--------+--------+--------+------------------+--------+ CCA Distal65      15              heterogenous               +----------+--------+--------+--------+------------------+--------+ ICA Prox  65      23      1-39%   heterogenous               +----------+--------+--------+--------+------------------+--------+ ICA Distal51      14                                          +----------+--------+--------+--------+------------------+--------+ ECA       139                                                +----------+--------+--------+--------+------------------+--------+ +----------+--------+--------+--------+-------------------+           PSV cm/sEDV cm/sDescribeArm Pressure (mmHG) +----------+--------+--------+--------+-------------------+ [id number]                                         +----------+--------+--------+--------+-------------------+ +---------+--------+--+--------+--+---------+ VertebralPSV cm/s42EDV cm/s14Antegrade +---------+--------+--+--------+--+---------+   Summary: Right Carotid: Velocities in the right ICA are consistent with a 1-39% stenosis. Left Carotid: Velocities in the left ICA are consistent with a 1-39% stenosis. Vertebrals: Bilateral vertebral arteries demonstrate antegrade flow. *See table(s) above for measurements and observations.  Electronically signed by Delia Heady MD on 12/27/2021 at 8:30:59 AM.    Final  ECHOCARDIOGRAM COMPLETE  Result Date: 12/24/2021    ECHOCARDIOGRAM REPORT   Patient Name:   ASHAUREAH HEWELL Date of Exam: 12/24/2021 Medical Rec #:  [id number]         Height:       60.0 in Accession #:    [id number]        Weight:       172.0 lb Date of Birth:  1932/01/15         BSA:          1.751 m Patient Age:    89 years          BP:           162/69 mmHg Patient Gender: F                 HR:           90 bpm. Exam Location:  Inpatient Procedure: 2D Echo, Color Doppler, Cardiac Doppler and Intracardiac            Opacification Agent Indications:    Stroke  History:        Patient has prior history of Echocardiogram examinations, most                 recent 02/23/2018. CHF, TIA, PAD and COPD; Risk                 Factors:Hypertension and Dyslipidemia. Cancer.  Sonographer:    Milda Smart Referring Phys: [id number] JENNIFER YATES  Sonographer Comments: Technically difficult study due to poor echo windows.  Image acquisition challenging due to patient body habitus, Image acquisition challenging due to respiratory motion and Image acquisition challenging due to COPD. IMPRESSIONS  1. Left ventricular ejection fraction, by estimation, is 60 to 65%. Left ventricular ejection fraction by PLAX is 62 %. The left ventricle has normal function. The left ventricle has no regional wall motion abnormalities. Left ventricular diastolic parameters are consistent with Grade I diastolic dysfunction (impaired relaxation). Elevated left ventricular end-diastolic pressure. The E/e' is 18.  2. Right ventricular systolic function is mildly reduced. The right ventricular size is normal. There is normal pulmonary artery systolic pressure. The estimated right ventricular systolic pressure is 28.2 mmHg.  3. The mitral valve is abnormal. Trivial mitral valve regurgitation.  4. The aortic valve is tricuspid. Aortic valve regurgitation is not visualized. Aortic valve sclerosis/calcification is present, without any evidence of aortic stenosis.  5. The inferior vena cava is normal in size with greater than 50% respiratory variability, suggesting right atrial pressure of 3 mmHg. Comparison(s): Changes from prior study are noted. 02/23/2018: LVEF 55-60%, grade 1 DD, trivial pericardial effusion. FINDINGS  Left Ventricle: Left ventricular ejection fraction, by estimation, is 60 to 65%. Left ventricular ejection fraction by PLAX is 62 %. The left ventricle has normal function. The left ventricle has no regional wall motion abnormalities. Definity contrast agent was given IV to delineate the left ventricular endocardial borders. The left ventricular internal cavity size was normal in size. There is no left ventricular hypertrophy. Left ventricular diastolic parameters are consistent with Grade I diastolic dysfunction (impaired relaxation). Elevated left ventricular end-diastolic pressure. The E/e' is 61. Right Ventricle: The right ventricular size is  normal. No increase in right ventricular wall thickness. Right ventricular systolic function is mildly reduced. There is normal pulmonary artery systolic pressure. The tricuspid regurgitant velocity is 2.51 m/s, and with an assumed right atrial pressure of 3 mmHg, the estimated right ventricular systolic pressure is 28.2  mmHg. Left Atrium: Left atrial size was normal in size. Right Atrium: Right atrial size was normal in size. Pericardium: Trivial pericardial effusion is present. The pericardial effusion is posterior to the left ventricle. Mitral Valve: The mitral valve is abnormal. Mild to moderate mitral annular calcification. Trivial mitral valve regurgitation. MV peak gradient, 8.2 mmHg. The mean mitral valve gradient is 4.0 mmHg. Tricuspid Valve: The tricuspid valve is grossly normal. Tricuspid valve regurgitation is trivial. Aortic Valve: The aortic valve is tricuspid. Aortic valve regurgitation is not visualized. Aortic valve sclerosis/calcification is present, without any evidence of aortic stenosis. Pulmonic Valve: The pulmonic valve was grossly normal. Pulmonic valve regurgitation is trivial. Aorta: The aortic root and ascending aorta are structurally normal, with no evidence of dilitation. Venous: The inferior vena cava is normal in size with greater than 50% respiratory variability, suggesting right atrial pressure of 3 mmHg. IAS/Shunts: No atrial level shunt detected by color flow Doppler.  LEFT VENTRICLE PLAX 2D LV EF:         Left            Diastology                ventricular     LV e' medial:    4.38 cm/s                ejection        LV E/e' medial:  17.9                fraction by     LV e' lateral:   4.03 cm/s                PLAX is 62      LV E/e' lateral: 19.5                %. LVIDd:         4.40 cm LVIDs:         2.95 cm LV PW:         0.70 cm LV IVS:        0.70 cm LVOT diam:     1.90 cm LV SV:         53 LV SV Index:   30 LVOT Area:     2.84 cm  LV Volumes (MOD) LV vol d, MOD    49.4 ml  A2C: LV vol d, MOD    88.0 ml A4C: LV vol s, MOD    22.9 ml A2C: LV vol s, MOD    35.3 ml A4C: LV SV MOD A2C:   26.5 ml LV SV MOD A4C:   88.0 ml LV SV MOD BP:    37.9 ml RIGHT VENTRICLE            IVC RV S prime:     9.90 cm/s  IVC diam: 0.70 cm TAPSE (M-mode): 1.2 cm LEFT ATRIUM             Index        RIGHT ATRIUM           Index LA diam:        4.10 cm 2.34 cm/m   RA Area:     17.70 cm LA Vol (A2C):   65.6 ml 37.47 ml/m  RA Volume:   49.00 ml  27.99 ml/m LA Vol (A4C):   45.6 ml 26.05 ml/m LA Biplane Vol: 55.1 ml 31.47 ml/m  AORTIC VALVE  PULMONIC VALVE LVOT Vmax:   96.10 cm/s  PR End Diast Vel: 7.29 msec LVOT Vmean:  62.500 cm/s LVOT VTI:    0.188 m  AORTA Ao Root diam: 2.70 cm Ao Asc diam:  3.60 cm MITRAL VALVE                TRICUSPID VALVE MV Area (PHT): 3.33 cm     TR Peak grad:   25.2 mmHg MV Area VTI:   1.96 cm     TR Vmax:        251.00 cm/s MV Peak grad:  8.2 mmHg MV Mean grad:  4.0 mmHg     SHUNTS MV Vmax:       1.43 m/s     Systemic VTI:  0.19 m MV Vmean:      90.7 cm/s    Systemic Diam: 1.90 cm MV Decel Time: 228 msec MV E velocity: 78.60 cm/s MV A velocity: 129.00 cm/s MV E/A ratio:  0.61 Zoila Shutter MD Electronically signed by Zoila Shutter MD Signature Date/Time: 12/24/2021/12:14:00 PM    Final    MR ANGIO HEAD WO CONTRAST  Result Date: 12/24/2021 CLINICAL DATA:  Left hand weakness and numbness; small acute infarct on MRI EXAM: MRA HEAD WITHOUT CONTRAST TECHNIQUE: Angiographic images of the Circle of Willis were acquired using MRA technique without intravenous contrast. COMPARISON:  MRA head 11/13/2009, correlation is made with CTA head 02/22/2018 FINDINGS: Redemonstrated focus of restricted diffusion with ADC correlate in the right precentral gyrus (series 5, image 88), compatible with acute infarct. Anterior circulation: Both internal carotid arteries are patent to the termini, without significant stenosis. A1 segments patent. Normal anterior communicating artery.  Anterior cerebral arteries are patent to their distal aspects. No M1 stenosis or occlusion. Distal MCA branches perfused and symmetric. Posterior circulation: Vertebral arteries patent to the vertebrobasilar junction without stenosis. Posterior inferior cerebral arteries patent bilaterally. Basilar patent to its distal aspect. Superior cerebellar arteries patent bilaterally. Patent P1 segments. PCAs perfused to their distal aspects without stenosis. The bilateral posterior communicating arteries are not visualized. Anatomic variants: None significant IMPRESSION: No intracranial large vessel occlusion or significant stenosis. Electronically Signed   By: Wiliam Ke M.D.   On: 12/24/2021 03:23   MR CERVICAL SPINE W WO CONTRAST  Result Date: 12/23/2021 CLINICAL DATA:  Left finger weakness EXAM: MRI CERVICAL SPINE WITHOUT AND WITH CONTRAST TECHNIQUE: Multiplanar and multiecho pulse sequences of the cervical spine, to include the craniocervical junction and cervicothoracic junction, were obtained without and with intravenous contrast. CONTRAST:  8mL GADAVIST GADOBUTROL 1 MMOL/ML IV SOLN COMPARISON:  None Available. FINDINGS: Alignment: There is trace anterolisthesis of C3 on C4 and trace retrolisthesis of C4 on C5. Vertebrae: No fracture, evidence of discitis, or bone lesion. Cord: Normal signal and morphology. Posterior Fossa, vertebral arteries, paraspinal tissues: Negative. Disc levels: C1-C2: Mild degenerative change. C2-C3: Moderate bilateral facet degenerative change. Minimal disc bulge. No spinal canal stenosis. No significant neural foraminal stenosis. C3-C4: Small central disc protrusion that contacts the ventral spinal cord. Severe bilateral facet degenerative change. Ligamentum flavum hypertrophy. Mild spinal canal stenosis. Uncovertebral hypertrophy. Mild bilateral neural foraminal stenosis. C4-C5: Moderate disc space loss. Circumferential disc bulge. Ligamentum flavum hypertrophy. Moderate bilateral  facet degenerative change. Uncovertebral hypertrophy. Moderate to severe spinal canal stenosis. Severe bilateral neural foraminal stenosis. C5-C6: Ligamentum flavum hypertrophy. Circumferential disc bulge. Uncovertebral hypertrophy. Moderate bilateral facet degenerative change. Severe spinal canal and bilateral neural foraminal stenosis. C6-C7: Moderate disc space loss. Ligamentum flavum hypertrophy. Mild bilateral  facet degenerative change. Uncovertebral hypertrophy. Severe bilateral neural foraminal stenosis. Severe spinal canal stenosis. C7-T1: Severe bilateral facet degenerative change. Ligamentum flavum hypertrophy. No significant spinal canal or neural foraminal stenosis. IMPRESSION: 1. Midcervical spine predominant degenerative change with severe spinal canal stenosis at C4-C5, C5-C6, and C6-C7, as described above. 2. Severe bilateral neural foraminal stenosis is also noted at C4-C5, C5-C6, and C6-C7. 3. No evidence of cord signal abnormality or abnormal contrast enhancement. Electronically Signed   By: Lorenza Cambridge M.D.   On: 12/23/2021 16:44   MR BRAIN WO CONTRAST  Result Date: 12/23/2021 CLINICAL DATA:  Left finger weakness EXAM: MRI HEAD WITHOUT CONTRAST TECHNIQUE: Multiplanar, multiecho pulse sequences of the brain and surrounding structures were obtained without intravenous contrast. COMPARISON:  None Available. FINDINGS: Brain: Small acute infarct in the precentral gyrus on the right (series 5, image 88).There is sequela of severe chronic microvascular ischemic change. No extra-axial fluid collection. There is a small focus of microhemorrhage in the right cerebellar hemisphere. Vascular: Normal flow voids. Skull and upper cervical spine: No worrisome lesions in the skull. See separately dictated report for cervical spine findings. Sinuses/Orbits: Bilateral lens replacement. Likely a mucous retention cyst in the left maxillary sinus. Mild mucosal thickening bilateral ethmoid sinuses. Other: None  IMPRESSION: Small focus of acute infarct in the precentral gyrus on the right. Electronically Signed   By: Lorenza Cambridge M.D.   On: 12/23/2021 16:36   CT HEAD WO CONTRAST  Result Date: 12/23/2021 CLINICAL DATA:  Acute neuro deficit.  Rule out stroke.  Weakness EXAM: CT HEAD WITHOUT CONTRAST TECHNIQUE: Contiguous axial images were obtained from the base of the skull through the vertex without intravenous contrast. RADIATION DOSE REDUCTION: This exam was performed according to the departmental dose-optimization program which includes automated exposure control, adjustment of the mA and/or kV according to patient size and/or use of iterative reconstruction technique. COMPARISON:  CT head 11/12/2009.  MRI head 02/22/2018 FINDINGS: Brain: Mild atrophy. Negative for hydrocephalus. Moderate to advanced white matter changes with diffuse white matter hypodensity. Negative for acute infarct, hemorrhage, mass Vascular: Negative for hyperdense vessel Skull: Negative Sinuses/Orbits: Mild mucosal edema paranasal sinuses. 5 mm osteoma left ethmoid sinus. Bilateral cataract extraction Other: None IMPRESSION: Atrophy and moderate to advanced chronic microvascular ischemia. No acute abnormality. Electronically Signed   By: Marlan Palau M.D.   On: 12/23/2021 11:33   DG Chest 2 View  Result Date: 12/23/2021 CLINICAL DATA:  Short of breath EXAM: CHEST - 2 VIEW COMPARISON:  Chest 10/01/2020 FINDINGS: Cardiac enlargement without heart failure. Mild airspace disease in lung bases best seen on the lateral view. Probable atelectasis. No effusion. Lungs show mild hyperinflation. Atherosclerotic calcification aortic arch. IMPRESSION: 1. Cardiac enlargement without heart failure. 2. Mild bibasilar atelectasis. Electronically Signed   By: Marlan Palau M.D.   On: 12/23/2021 11:27   DG Abd 2 Views  Result Date: 12/19/2021 CLINICAL DATA:  Abdominal bloating.  History of colon cancer. EXAM: ABDOMEN - 2 VIEW COMPARISON:  None  Available. FINDINGS: The bowel gas pattern is normal. There is no evidence of free air. Phleboliths are noted in the pelvis. IMPRESSION: No abnormal bowel dilatation. Aortic Atherosclerosis (ICD10-I70.0). Electronically Signed   By: Lupita Raider M.D.   On: 12/19/2021 07:34    Microbiology: Results for orders placed or performed during the hospital encounter of 01/10/22  Resp panel by RT-PCR (RSV, Flu A&B, Covid) Anterior Nasal Swab     Status: None   Collection Time: 01/10/22  8:00  PM   Specimen: Anterior Nasal Swab  Result Value Ref Range Status   SARS Coronavirus 2 by RT PCR NEGATIVE NEGATIVE Final    Comment: (NOTE) SARS-CoV-2 target nucleic acids are NOT DETECTED.  The SARS-CoV-2 RNA is generally detectable in upper respiratory specimens during the acute phase of infection. The lowest concentration of SARS-CoV-2 viral copies this assay can detect is 138 copies/mL. A negative result does not preclude SARS-Cov-2 infection and should not be used as the sole basis for treatment or other patient management decisions. A negative result may occur with  improper specimen collection/handling, submission of specimen other than nasopharyngeal swab, presence of viral mutation(s) within the areas targeted by this assay, and inadequate number of viral copies(<138 copies/mL). A negative result must be combined with clinical observations, patient history, and epidemiological information. The expected result is Negative.  Fact Sheet for Patients:  BloggerCourse.com  Fact Sheet for Healthcare Providers:  SeriousBroker.it  This test is no t yet approved or cleared by the [place] FDA and  has been authorized for detection and/or diagnosis of SARS-CoV-2 by FDA under an Emergency Use Authorization (EUA). This EUA will remain  in effect (meaning this test can be used) for the duration of the COVID-19 declaration under Section 564(b)(1) of the  Act, 21 U.S.C.section 360bbb-3(b)(1), unless the authorization is terminated  or revoked sooner.       Influenza A by PCR NEGATIVE NEGATIVE Final   Influenza B by PCR NEGATIVE NEGATIVE Final    Comment: (NOTE) The Xpert Xpress SARS-CoV-2/FLU/RSV plus assay is intended as an aid in the diagnosis of influenza from Nasopharyngeal swab specimens and should not be used as a sole basis for treatment. Nasal washings and aspirates are unacceptable for Xpert Xpress SARS-CoV-2/FLU/RSV testing.  Fact Sheet for Patients: BloggerCourse.com  Fact Sheet for Healthcare Providers: SeriousBroker.it  This test is not yet approved or cleared by the [place] FDA and has been authorized for detection and/or diagnosis of SARS-CoV-2 by FDA under an Emergency Use Authorization (EUA). This EUA will remain in effect (meaning this test can be used) for the duration of the COVID-19 declaration under Section 564(b)(1) of the Act, 21 U.S.C. section 360bbb-3(b)(1), unless the authorization is terminated or revoked.     Resp Syncytial Virus by PCR NEGATIVE NEGATIVE Final    Comment: (NOTE) Fact Sheet for Patients: BloggerCourse.com  Fact Sheet for Healthcare Providers: SeriousBroker.it  This test is not yet approved or cleared by the [place] FDA and has been authorized for detection and/or diagnosis of SARS-CoV-2 by FDA under an Emergency Use Authorization (EUA). This EUA will remain in effect (meaning this test can be used) for the duration of the COVID-19 declaration under Section 564(b)(1) of the Act, 21 U.S.C. section 360bbb-3(b)(1), unless the authorization is terminated or revoked.  Performed at [facility], [address], [place], [place] [address]   Urine Culture     Status: Abnormal   Collection Time: 01/11/22  1:37 PM   Specimen: Urine, Clean Catch  Result Value Ref Range  Status   Specimen Description URINE, CLEAN CATCH  Final   Special Requests   Final    NONE Performed at [facility], [address] [place], [place] [address]    Culture >=100,000 COLONIES/mL ESCHERICHIA COLI (A)  Final   Report Status 01/13/2022 FINAL  Final   Organism ID, Bacteria ESCHERICHIA COLI (A)  Final      Susceptibility   Escherichia coli - MIC*  AMPICILLIN 8 SENSITIVE Sensitive     CEFAZOLIN <=4 SENSITIVE Sensitive     CEFEPIME <=0.12 SENSITIVE Sensitive     CEFTRIAXONE <=0.25 SENSITIVE Sensitive     CIPROFLOXACIN <=0.25 SENSITIVE Sensitive     GENTAMICIN <=1 SENSITIVE Sensitive     IMIPENEM <=0.25 SENSITIVE Sensitive     NITROFURANTOIN <=16 SENSITIVE Sensitive     TRIMETH/SULFA <=20 SENSITIVE Sensitive     AMPICILLIN/SULBACTAM 4 SENSITIVE Sensitive     PIP/TAZO <=4 SENSITIVE Sensitive     * >=100,000 COLONIES/mL ESCHERICHIA COLI  MRSA Next Gen by PCR, Nasal     Status: None   Collection Time: 01/12/22  5:21 AM   Specimen: Nasal Mucosa; Nasal Swab  Result Value Ref Range Status   MRSA by PCR Next Gen NOT DETECTED NOT DETECTED Final    Comment: (NOTE) The GeneXpert MRSA Assay (FDA approved for NASAL specimens only), is one component of a comprehensive MRSA colonization surveillance program. It is not intended to diagnose MRSA infection nor to guide or monitor treatment for MRSA infections. Test performance is not FDA approved in patients less than 58 years old. Performed at [facility], [address] [place], [place] [address]     Labs: CBC: Recent Labs  Lab 01/10/22 1953 01/12/22 0100  WBC 9.2 7.1  NEUTROABS 7.0  --   HGB 15.0 14.8  HCT 46.1* 45.9  MCV 106.0* 105.5*  PLT 118* 98*   Basic Metabolic Panel: Recent Labs  Lab 01/10/22 1953 01/11/22 1613 01/12/22 0100 01/13/22 0119 01/14/22 0040  NA 138 141 134* 138 141  K 4.4 4.4 4.2 3.9 3.6  CL 97* 101 99 99 101  CO2 30 29 25 30 26   GLUCOSE 118* 87 88 151* 127*  BUN 40* 30*  26* 21 17  CREATININE 1.25* 1.01* 0.93 0.84 0.65  CALCIUM 12.9* 11.9* 11.0* 10.4* 9.8  MG  --   --   --  1.6* 2.1   Liver Function Tests: Recent Labs  Lab 01/10/22 1953  AST 33  ALT 25  ALKPHOS 55  BILITOT 1.0  PROT 6.9  ALBUMIN 3.8   CBG: No results for input(s): "GLUCAP" in the last 168 hours.  Discharge time spent: greater than 30 minutes.  Signed: Coralie Keens, MD [facility] 01/14/2022

## 2022-01-14 NOTE — TOC Transition Note (Signed)
Discharge medication from [facility] left with family to take home.

## 2022-01-14 NOTE — Consult Note (Signed)
   [facility] Inpatient Consult   01/14/2022  Gina [name] 1932/02/06 [id number]  [place]  Accountable Care Organization [ACO] Patient: Medicare ACO REACH  Primary Care Provider:  Cassandria Anger, MD with [place] at [facility] which is listed for the transition of care follow up  Patient screened for less than 30 days readmission hospitalization to assess for potential [place] Management service needs for post hospital transition for care coordination.  Discussion in unit TOC progression meeting for post hospital needs.  2:15 pm: Patient to transition home with home health and was asleep on rounds, gave her family member a reminder appointment card.  Did not awaken patient at this time. No new needs noted.  Plan:  Continue to follow progress and disposition to assess for post hospital community care coordination/management needs.  Referral request for community care coordination: no needs noted listed to be followed by TOC call/follow up for community.  Of note, [facility] Care Management/Population Health does not replace or interfere with any arrangements made by the Inpatient Transition of Care team.  For questions contact:   Natividad Brood, RN BSN [place]  [phone] business mobile phone Toll free office [phone]  *[place]  [phone] Fax number: [phone] [email or web address]

## 2022-01-15 NOTE — Plan of Care (Signed)
Problem: Education: Goal: Knowledge of disease or condition will improve Outcome: Adequate for Discharge Goal: Knowledge of secondary prevention will improve (MUST DOCUMENT ALL) Outcome: Adequate for Discharge Goal: Knowledge of patient specific risk factors will improve Gina [name] N/A or DELETE if not current risk factor) Outcome: Adequate for Discharge   Problem: Ischemic Stroke/TIA Tissue Perfusion: Goal: Complications of ischemic stroke/TIA will be minimized Outcome: Adequate for Discharge   Problem: Coping: Goal: Will verbalize positive feelings about self Outcome: Adequate for Discharge Goal: Will identify appropriate support needs Outcome: Adequate for Discharge   Problem: Health Behavior/Discharge Planning: Goal: Ability to manage health-related needs will improve Outcome: Adequate for Discharge Goal: Goals will be collaboratively established with patient/family Outcome: Adequate for Discharge   Problem: Self-Care: Goal: Ability to participate in self-care as condition permits will improve Outcome: Adequate for Discharge Goal: Verbalization of feelings and concerns over difficulty with self-care will improve Outcome: Adequate for Discharge Goal: Ability to communicate needs accurately will improve Outcome: Adequate for Discharge   Problem: Nutrition: Goal: Risk of aspiration will decrease Outcome: Adequate for Discharge Goal: Dietary intake will improve Outcome: Adequate for Discharge   Problem: Education: Goal: Understanding of cardiac disease, CV risk reduction, and recovery process will improve Outcome: Adequate for Discharge Goal: Individualized Educational Video(s) Outcome: Adequate for Discharge   Problem: Activity: Goal: Ability to tolerate increased activity will improve Outcome: Adequate for Discharge   Problem: Health Behavior/Discharge Planning: Goal: Ability to safely manage health-related needs after discharge will improve Outcome: Adequate for  Discharge   Problem: Education: Goal: Knowledge of General Education information will improve Description: Including pain rating scale, medication(s)/side effects and non-pharmacologic comfort measures Outcome: Adequate for Discharge   Problem: Health Behavior/Discharge Planning: Goal: Ability to manage health-related needs will improve Outcome: Adequate for Discharge   Problem: Clinical Measurements: Goal: Ability to maintain clinical measurements within normal limits will improve Outcome: Adequate for Discharge Goal: Will remain free from infection Outcome: Adequate for Discharge Goal: Diagnostic test results will improve Outcome: Adequate for Discharge Goal: Respiratory complications will improve Outcome: Adequate for Discharge Goal: Cardiovascular complication will be avoided Outcome: Adequate for Discharge   Problem: Activity: Goal: Risk for activity intolerance will decrease Outcome: Adequate for Discharge   Problem: Nutrition: Goal: Adequate nutrition will be maintained Outcome: Adequate for Discharge   Problem: Elimination: Goal: Will not experience complications related to bowel motility Outcome: Adequate for Discharge   Problem: Pain Managment: Goal: General experience of comfort will improve Outcome: Adequate for Discharge   Problem: Safety: Goal: Ability to remain free from injury will improve Outcome: Adequate for Discharge   Problem: Skin Integrity: Goal: Risk for impaired skin integrity will decrease Outcome: Adequate for Discharge   Problem: Education: Goal: Knowledge of General Education information will improve Description: Including pain rating scale, medication(s)/side effects and non-pharmacologic comfort measures Outcome: Adequate for Discharge   Problem: Health Behavior/Discharge Planning: Goal: Ability to manage health-related needs will improve Outcome: Adequate for Discharge   Problem: Clinical Measurements: Goal: Ability to maintain  clinical measurements within normal limits will improve Outcome: Adequate for Discharge Goal: Will remain free from infection Outcome: Adequate for Discharge Goal: Diagnostic test results will improve Outcome: Adequate for Discharge Goal: Respiratory complications will improve Outcome: Adequate for Discharge Goal: Cardiovascular complication will be avoided Outcome: Adequate for Discharge   Problem: Activity: Goal: Risk for activity intolerance will decrease Outcome: Adequate for Discharge   Problem: Nutrition: Goal: Adequate nutrition will be maintained Outcome: Adequate for Discharge   Problem: Coping: Goal:  Level of anxiety will decrease Outcome: Adequate for Discharge   Problem: Elimination: Goal: Will not experience complications related to bowel motility Outcome: Adequate for Discharge Goal: Will not experience complications related to urinary retention Outcome: Adequate for Discharge   Problem: Pain Managment: Goal: General experience of comfort will improve Outcome: Adequate for Discharge   Problem: Safety: Goal: Ability to remain free from injury will improve Outcome: Adequate for Discharge   Problem: Skin Integrity: Goal: Risk for impaired skin integrity will decrease Outcome: Adequate for Discharge   Problem: Education: Goal: Ability to demonstrate management of disease process will improve Outcome: Adequate for Discharge Goal: Ability to verbalize understanding of medication therapies will improve Outcome: Adequate for Discharge Goal: Individualized Educational Video(s) Outcome: Adequate for Discharge   Problem: Activity: Goal: Capacity to carry out activities will improve Outcome: Adequate for Discharge   Problem: Cardiac: Goal: Ability to achieve and maintain adequate cardiopulmonary perfusion will improve Outcome: Adequate for Discharge

## 2022-01-15 NOTE — Progress Notes (Signed)
Patient is feeling well, no dyspnea or chest pain, continue weak and deconditioned. Po intake has improved.   BP (!) 144/72 (BP Location: Left Arm)   Pulse 93   Temp 98.4 F (36.9 C) (Oral)   Resp 18   Ht 5' (1.524 m)   Wt 71.1 kg   SpO2 92%   BMI 30.60 kg/m   Neurology awake and alert ENT with no pallor Cardiovascular with S1 and S2 present and rhythmic Respiratory with no rales or wheezing  Abdomen with no distention  No lower extremity edema.  Dehydration and recent CVA. Plan for discharge home today, her home equipment has been arranged, along with home health services. I spoke with patient's son at the bedside, we talked in detail about patient's condition, plan of care and prognosis and all questions were addressed.

## 2022-01-15 NOTE — Progress Notes (Signed)
OT Cancellation Note  Patient Details Name: Gina [name] MRN: [id number] DOB: 12/22/1931   Cancelled Treatment:    Reason Eval/Treat Not Completed: Other (comment).  Attempted skilled OT session this am.  Pt. Declines and asking to "wait and see" if she will participate later.  States she thinks she is going home later today also.  Will check back as able.    Clearnce Sorrel Lorraine-COTA/L 01/15/2022, 8:45 AM

## 2022-01-15 NOTE — TOC Transition Note (Addendum)
Transition of Care [facility]) - CM/SW Discharge Note   Patient Details  Name: Gina [name] MRN: [id number] Date of Birth: [birth year]  [facility] CM/SW Contact:  Carles Collet, RN Phone Number: 01/15/2022, 8:42 AM   Clinical Narrative:    Notified [facility] that patient will DC to home today. Per note yesterday DME has been delivered to the home and son will provide transportation.  No other TOC needs identified  *Informed patient would need O2 tank for transport, spoke w son, he had to speak w siblings to determine home oxygen company. [employer] is supplier. Spoke w Caryl Pina at [place] and he will get tank to her room for DC. [employer] is not onsite and this may take approx 2 hours.     Final next level of care: [place] Barriers to Discharge: No Barriers Identified   Patient Goals and CMS Choice Patient states their goals for this hospitalization and ongoing recovery are:: return home CMS Medicare.gov Compare Post Acute Care list provided to:: Patient Represenative (must comment) (son who is POA) Choice offered to / list presented to : [place] / [place]  Discharge Placement                       Discharge Plan and Services In-house Referral: NA Discharge Planning Services: CM Consult Post Acute Care Choice: Home Health          DME Arranged: Bedside commode, Hospital bed DME Agency: [employer] Date DME Agency Contacted: 01/13/22 Time DME Agency Contacted: [id number] Representative spoke with at DME Agency: Ulice Dash HH Arranged: RN, Disease Management, PT, OT, Nurse's Aide, Social Work [employer] Agency: [place] Date [place]: 01/15/22 Time [place]: 0840 Representative spoke with at [place]: [place] Interventions     Readmission Risk Interventions     No data to display

## 2022-01-18 ENCOUNTER — Telehealth: Payer: Self-pay | Admitting: Internal Medicine

## 2022-01-18 NOTE — Telephone Encounter (Signed)
Patient has a virtual visit tomorrow.  She is bed ridden - Son would like something called in for pain - his mother has 3 broken ribs and she is unable to sleep - Patient's son would also like a DNR form completed.  Please send meds to [facility] in [place] on [address].

## 2022-01-19 ENCOUNTER — Telehealth (INDEPENDENT_AMBULATORY_CARE_PROVIDER_SITE_OTHER): Payer: Medicare Other | Admitting: Internal Medicine

## 2022-01-19 ENCOUNTER — Encounter: Payer: Self-pay | Admitting: Internal Medicine

## 2022-01-19 ENCOUNTER — Other Ambulatory Visit: Payer: Self-pay

## 2022-01-19 ENCOUNTER — Other Ambulatory Visit: Payer: Self-pay | Admitting: Internal Medicine

## 2022-01-19 DIAGNOSIS — Z515 Encounter for palliative care: Secondary | ICD-10-CM | POA: Diagnosis not present

## 2022-01-19 DIAGNOSIS — I252 Old myocardial infarction: Secondary | ICD-10-CM | POA: Diagnosis not present

## 2022-01-19 DIAGNOSIS — K922 Gastrointestinal hemorrhage, unspecified: Secondary | ICD-10-CM | POA: Diagnosis not present

## 2022-01-19 DIAGNOSIS — E785 Hyperlipidemia, unspecified: Secondary | ICD-10-CM | POA: Diagnosis not present

## 2022-01-19 DIAGNOSIS — H3530 Unspecified macular degeneration: Secondary | ICD-10-CM | POA: Diagnosis not present

## 2022-01-19 DIAGNOSIS — D751 Secondary polycythemia: Secondary | ICD-10-CM | POA: Diagnosis not present

## 2022-01-19 DIAGNOSIS — J9610 Chronic respiratory failure, unspecified whether with hypoxia or hypercapnia: Secondary | ICD-10-CM | POA: Diagnosis not present

## 2022-01-19 DIAGNOSIS — R112 Nausea with vomiting, unspecified: Secondary | ICD-10-CM | POA: Diagnosis not present

## 2022-01-19 DIAGNOSIS — J302 Other seasonal allergic rhinitis: Secondary | ICD-10-CM | POA: Diagnosis not present

## 2022-01-19 DIAGNOSIS — K920 Hematemesis: Secondary | ICD-10-CM | POA: Diagnosis not present

## 2022-01-19 DIAGNOSIS — I2510 Atherosclerotic heart disease of native coronary artery without angina pectoris: Secondary | ICD-10-CM | POA: Diagnosis not present

## 2022-01-19 DIAGNOSIS — Z66 Do not resuscitate: Secondary | ICD-10-CM | POA: Diagnosis not present

## 2022-01-19 DIAGNOSIS — I130 Hypertensive heart and chronic kidney disease with heart failure and stage 1 through stage 4 chronic kidney disease, or unspecified chronic kidney disease: Secondary | ICD-10-CM | POA: Diagnosis not present

## 2022-01-19 DIAGNOSIS — I69318 Other symptoms and signs involving cognitive functions following cerebral infarction: Secondary | ICD-10-CM | POA: Diagnosis not present

## 2022-01-19 DIAGNOSIS — M109 Gout, unspecified: Secondary | ICD-10-CM | POA: Diagnosis not present

## 2022-01-19 DIAGNOSIS — I509 Heart failure, unspecified: Secondary | ICD-10-CM | POA: Diagnosis not present

## 2022-01-19 DIAGNOSIS — I739 Peripheral vascular disease, unspecified: Secondary | ICD-10-CM | POA: Diagnosis not present

## 2022-01-19 DIAGNOSIS — J159 Unspecified bacterial pneumonia: Secondary | ICD-10-CM | POA: Diagnosis not present

## 2022-01-19 DIAGNOSIS — N1832 Chronic kidney disease, stage 3b: Secondary | ICD-10-CM | POA: Diagnosis not present

## 2022-01-19 DIAGNOSIS — M1990 Unspecified osteoarthritis, unspecified site: Secondary | ICD-10-CM | POA: Diagnosis not present

## 2022-01-19 MED ORDER — HYDROCODONE-ACETAMINOPHEN 5-325 MG PO TABS: 0.5000 | ORAL_TABLET | Freq: Four times a day (QID) | ORAL | 0 refills | Status: DC | PRN

## 2022-01-19 MED ORDER — ONDANSETRON 8 MG PO TBDP: 8.0000 mg | ORAL_TABLET | Freq: Three times a day (TID) | ORAL | 1 refills | Status: AC | PRN

## 2022-01-19 MED ORDER — RABEPRAZOLE SODIUM 20 MG PO TBEC: 20.0000 mg | DELAYED_RELEASE_TABLET | Freq: Every day | ORAL | 1 refills | Status: DC

## 2022-01-19 MED ORDER — MORPHINE SULFATE (CONCENTRATE) 10 MG /0.5 ML PO SOLN: 10.0000 mg | ORAL | 0 refills | Status: AC | PRN

## 2022-01-19 MED ORDER — CLONAZEPAM 1 MG PO TBDP: 1.0000 mg | ORAL_TABLET | Freq: Two times a day (BID) | ORAL | 1 refills | Status: AC | PRN

## 2022-01-19 NOTE — Telephone Encounter (Signed)
Tried calling pt son no answer can't leave msg on wm due to voice mail is full.Marland KitchenJohny [name]

## 2022-01-19 NOTE — Progress Notes (Signed)
Virtual Visit via Telephone Note  I connected with [facility] +family today by telephone and verified that I am speaking with the correct person using two identifiers. Internet at medically Gina [name] is not working: we had to switch to a phone call  Location:  Persons participating in the virtual visit: patient son, dtr in [employer] coach, provider Patient: home Provider: [place] Office   I discussed the limitations, risks, security and privacy concerns of performing an evaluation and management service by telephone and the availability of in person appointments. I also discussed with the patient that there may be a patient responsible charge related to this service. The patient expressed understanding and agreed to proceed.  No chief complaint on file.    History of Present Illness:    The patient's condition has deteriorated a lot.  She has been in and out of consciousness.  She was restless.  She started to have nausea and vomiting with coffee-ground material coming up.  She became uncomfortable and anxious.  There has been dyspnea. The family is in contact with hospice.  They have not been to Elizabth's house to see her yet.   Per recent hx/discharge summary:   " Admit date:     01/10/2022  Discharge date: 01/15/22  Discharge Physician: Jimmy Picket Arrien    PCP: Cassandria Anger, MD    Recommendations at discharge:     Instructed to discontinue thiazide diuretic and change torsemide to as needed for signs of volume overload, weight gain 2 to 3 lbs in 24 hrs or 5 lbs in 7 days. Follow up renal function and electrolytes in 7 days. Increased amlodipine to 10 mg daily.  Follow up with Dr Alain Marion in 7 to 10 days. Continue home health services.    Discharge Diagnoses: Principal Problem:   Elevated troponin Active Problems:   Chronic respiratory failure with hypoxia (HCC)   History of CVA (cerebrovascular accident) without residual deficits   Chronic diastolic CHF  (congestive heart failure) (HCC)   Spinal stenosis   Essential hypertension   Chronic kidney disease, stage 3b (HCC)   Malnutrition of moderate degree   Resolved Problems:   * No resolved hospital problems. [place] Hospital Course: Mr. Streets was admitted to the hospital with the working diagnosis of dehydration complicated with elevated troponin.    88 yo female with the past medical history if hypertension, dyslipidemia, peripheral vascular disease and GI bleed who presented with weakness and worsening tremors. Recent hospitalization 11/9 to 12/24/21 for acute ischemic CVA, right precentral gyrus. She was placed on ticagrelor and loop recorder was placed. At home she continue to have difficulty using her left hand, had decreased appetite, dyspnea and generalized fatigue that prompted her to come back to the hospital. On her initial physical examination she was hypoxemic and required supplemental 02 per [place], blood pressure 139/58, HR 83, RR 20 and 02 saturation 92%, lungs with mild decreased breath sounds at bases, heart with S1 and S2 present and rhythmic, no gallops, abdomen with no distention, positive lower extremity edema pitting.    Na 138, K 4,4 Cl 97 bicarbonate 30 glucose 118, bun 40 cr 1,25 High sensitive troponin 255 and 302  Wbc 9,2 hgb 15.0 plt 118  Sars covid 19 negative    Chest radiograph with cardiomegaly with mild hilar vascular congestion, and small bilateral pleural effusions.    EKG 97 bpm, right axis deviation, normal intervals, sinus rhythm with no significant ST segment or T wave changes.  CT chest with no pulmonary embolism. Positive 11 mm irregular left upper lobe pulmonary nodule.    11/30 Patient has ruled out for acute coronary syndrome, clinically improving with supportive medical care. 12/01 renal function and electrolytes are stable, patient will need home health services.  Check ambulatory oxymetry on room air.  Follow up as outpatient.    Assessment  and Plan: * Elevated troponin Positive troponin elevation, no chest pain,  No EKG changes suggesting ischemia Echocardiogram with preserved LV systolic function with no wall motion abnormalities.    At this point ruled out acute coronary syndrome.    Chronic respiratory failure with hypoxia (HCC) COPD with no clinical sings of exacerbation.    Continue supplemental 02 per [place].  Patient with no worsening respiratory failure, she ruled out for pulmonary infection and pulmonary embolism.  Continue supplemental 02 per [place] at home to keep 02 saturation 92% or greater.  Continue with bronchodilator therapy.    History of CVA (cerebrovascular accident) without residual deficits Recent CVA, patient very weak and deconditioned, poor oral intake, Patient has been placed on nutritional supplements. No focal neurologic deficit.  Continue antiplatelet therapy, statin and blood pressure control. Continue with home PT and OT.    Chronic diastolic CHF (congestive heart failure) (HCC) No clinical signs of exacerbation   Patient was placed on IV fluids with good toleration.  At the time of her discharge will continue blood pressure monitoring.  Loop diuretic therapy only as needed.  Hold on thiazide diuretic to prevent dehydration and electrolyte abnormalities.    Spinal stenosis Continue pain control, Pt and OT   Essential hypertension Continue blood pressure control with amlodipine and losartan.  Blood pressure has been 130 to 160 mmHg.  Hold on diuretic therapy and increase dose of amlodipine to 10 mg.    Chronic kidney disease, stage 3b (HCC) Hyponatremia. Hypomagnesemia    Patient received isotonic saline with good toleration, her Mag was corrected with IV mag sulfate.  At the time of her discharge her renal function had a serum cr of 0.65, with K at 3,6 and serum bicarbonate at 26.  Mg 2,1. Will give Kcl 40 meq before discharge. Continue holding diuretic therapy. Follow up renal  function and electrolytes as outpatient.    Malnutrition of moderate degree Patient has been placed on nutritional supplementation.                Consultants: cardiology  Procedures performed: none   Disposition: Home ROS  " Observations/Objective:   Assessment and Plan:  Problem List Items Addressed This Visit     Coffee ground emesis    New Rx - Aciphex Roxanol, Clonazepam, Zofran prn DNR confirmed Hospice Care      DNR (do not resuscitate) - Primary (Chronic)    DNR confirmed Hospice Care      Terminal care    Roxanol, Clonazepam, Zofran prn DNR confirmed Hospice Care        Meds ordered this encounter  Medications   ondansetron (ZOFRAN-ODT) 8 MG disintegrating tablet    Sig: Take 1-2 tablets (8-16 mg total) by mouth every 8 (eight) hours as needed for nausea, vomiting or refractory nausea / vomiting.    Dispense:  30 tablet    Refill:  1    Hospice Care   RABEprazole (ACIPHEX) 20 MG tablet    Sig: Take 1 tablet (20 mg total) by mouth daily.    Dispense:  30 tablet    Refill:  1  Hospice Care   clonazePAM (KLONOPIN) 1 MG disintegrating tablet    Sig: Take 1-2 tablets (1-2 mg total) by mouth 2 (two) times daily as needed (anxiety, agitation).    Dispense:  60 tablet    Refill:  1    Hospice Care   Morphine Sulfate (MORPHINE CONCENTRATE) 10 mg / 0.5 ml concentrated solution    Sig: Take 0.5-1 mLs (10-20 mg total) by mouth every 2 (two) hours as needed for severe pain or shortness of breath.    Dispense:  118 mL    Refill:  0    Hospice Care    Since Hospice has not been by yet, the family will have to take Alexsandra to ER if she deteriorates.  Will go there by ambulance.  Follow Up Instructions:    I discussed the assessment and treatment plan with the patient. The patient was provided an opportunity to ask questions and all were answered. The patient agreed with the plan and demonstrated an understanding of the instructions.   The patient  was advised to call back or seek an in-person evaluation if the symptoms worsen or if the condition fails to improve as anticipated.  I provided 29 minutes of non-face-to-face time during this encounter.   Walker Kehr, MD

## 2022-01-19 NOTE — Progress Notes (Addendum)
[place] referral submitted to referral center.  42- Dr. Alain Marion agreed to hospice referral. He would like to remain attending and would also like to manage her symptoms while under hospice.   27- RN contacted PCP to notify that message had been sent regarding hospice referral.

## 2022-01-19 NOTE — Telephone Encounter (Signed)
Called son vm still full. Called pt (c) LMOM to have son call bck  and also MD sent pain med to pof.Marland KitchenJohny Chess

## 2022-01-19 NOTE — Assessment & Plan Note (Signed)
Roxanol, Clonazepam, Zofran prn DNR confirmed Hospice Care

## 2022-01-19 NOTE — Assessment & Plan Note (Signed)
New Rx - Aciphex Roxanol, Clonazepam, Zofran prn DNR confirmed Hospice Care

## 2022-01-19 NOTE — Patient Instructions (Addendum)
PC Call [phone]     RN spoke with son, Clair Gulling via phone. Explained purpose of call and obtained 2 pt identifiers for verification. Explained role of palliative care medicine and differences between [facility] and hospice. Voiced understanding.  Son reports that pt had just been discharged from hospital on 01/15/22. Was hospitalized due to weakness and" obtained 3 fractured ribs from fall prior to transport. Son states, "mom was walking with her walker as EMS got here, she had an easy slump over but unfortunately has bruises and got fractured ribs from it. Son also report that at beginning of September, pt weighed just under 200 pounds, but as of this past weekend in hospital, she currently weighs 156 pounds.      Son reports that currently, pt is resting in hospital bed. He reports that they are offering frequent sips, but it would be a generous estimate that she may drink a pint of fluids per day. She has stopped eating. They are offering sips of ensure often, but she has not ingested a whole one yet. Also reports that pt "got sick to her stomach a couple of times last night and it was thick and tinged dark brown, like old blood"       Discussed hospice care with son who reports that dad had hospice care 14 years ago, and he is open to this for his mom. States he knows it is time. Agrees to RN contacting PCP for hospice referral. Message sent to Dr. Alain Marion for orders. Reassured son and ensured he had PC number for any concerns or questions.

## 2022-01-19 NOTE — Assessment & Plan Note (Signed)
DNR confirmed Hospice Care

## 2022-01-19 NOTE — Telephone Encounter (Signed)
Noted.  Will send a pain prescription now.  Please get me a yellow DNR form.   Use Salonpas patches over the broken ribs. They can get a rib belt at the pharmacy.  thank you

## 2022-01-20 ENCOUNTER — Telehealth: Payer: Self-pay

## 2022-01-20 ENCOUNTER — Other Ambulatory Visit: Payer: Self-pay

## 2022-01-20 DIAGNOSIS — N1832 Chronic kidney disease, stage 3b: Secondary | ICD-10-CM | POA: Diagnosis not present

## 2022-01-20 DIAGNOSIS — I509 Heart failure, unspecified: Secondary | ICD-10-CM | POA: Diagnosis not present

## 2022-01-20 DIAGNOSIS — I130 Hypertensive heart and chronic kidney disease with heart failure and stage 1 through stage 4 chronic kidney disease, or unspecified chronic kidney disease: Secondary | ICD-10-CM | POA: Diagnosis not present

## 2022-01-20 DIAGNOSIS — I252 Old myocardial infarction: Secondary | ICD-10-CM | POA: Diagnosis not present

## 2022-01-20 DIAGNOSIS — R112 Nausea with vomiting, unspecified: Secondary | ICD-10-CM | POA: Diagnosis not present

## 2022-01-20 DIAGNOSIS — K922 Gastrointestinal hemorrhage, unspecified: Secondary | ICD-10-CM | POA: Diagnosis not present

## 2022-01-20 NOTE — Telephone Encounter (Signed)
Called son [name]) again still no answer.. So I called pt home spoke w/ another son Clair Gulling) he states they went ahead and made the decision to put her in hospice. [facility] is with [facility]. They have stop her meds just been giving her the morphine which is keeping her calm/relax. Inform MD pt w/ hospice.Marland KitchenJohny Chess

## 2022-01-20 NOTE — Telephone Encounter (Signed)
Following alert received from [employer] received fo  One new AF episode. AF burden is 0.9%.  Ventricullar rates are not well controlled.  There were four tachy episodes that are irregular and appear to be AF at rates of 160-170 bpm, Presenting rhythm is irregular and fast, it may be AF.  No OAC.   Spoke to patients son, reports patient is now under hospice care and their main goal is to keep patient calm and safe. Offered prayers and if we can do anything to let us know. He was appreciative. Advised I will forward to provider to let him know.

## 2022-01-21 DIAGNOSIS — R112 Nausea with vomiting, unspecified: Secondary | ICD-10-CM | POA: Diagnosis not present

## 2022-01-21 DIAGNOSIS — N1832 Chronic kidney disease, stage 3b: Secondary | ICD-10-CM | POA: Diagnosis not present

## 2022-01-21 DIAGNOSIS — K922 Gastrointestinal hemorrhage, unspecified: Secondary | ICD-10-CM | POA: Diagnosis not present

## 2022-01-21 DIAGNOSIS — I252 Old myocardial infarction: Secondary | ICD-10-CM | POA: Diagnosis not present

## 2022-01-21 DIAGNOSIS — I130 Hypertensive heart and chronic kidney disease with heart failure and stage 1 through stage 4 chronic kidney disease, or unspecified chronic kidney disease: Secondary | ICD-10-CM | POA: Diagnosis not present

## 2022-01-21 DIAGNOSIS — I509 Heart failure, unspecified: Secondary | ICD-10-CM | POA: Diagnosis not present

## 2022-01-24 ENCOUNTER — Telehealth: Payer: Self-pay | Admitting: Internal Medicine

## 2022-01-24 NOTE — Telephone Encounter (Signed)
Patient's son called and said that the funeral home needs the electronic copy of the Death Certificate signed  ASAP - They can not proceed with plans until they receive it.  Patient's son also wanted to thank Dr. Alain Marion for the excellent care he has given his mother all the years.

## 2022-01-24 NOTE — Telephone Encounter (Signed)
Completed Thx

## 2022-01-24 NOTE — Telephone Encounter (Signed)
Holly with [facility] calls today to confirm PT's passing as well. I let her know that we would get this information back to Dr.Plotnikov.

## 2022-01-24 NOTE — Telephone Encounter (Signed)
Patient's son called and stated that the patient passed away on Friday 2022-01-29 around 8pm and that the funeral home sent over the patient's death certificate to Dr. Parks Neptune email that needs to signed and sent back as soon as possible so they can move forward with plans. Best number to callback is [phone].

## 2022-01-24 NOTE — Telephone Encounter (Signed)
Done  Thx   Card sent

## 2022-01-25 NOTE — Telephone Encounter (Signed)
Card was mailed.Marland Kitchenlb

## 2022-01-26 ENCOUNTER — Ambulatory Visit: Payer: Medicare Other | Admitting: Podiatry

## 2022-02-09 ENCOUNTER — Inpatient Hospital Stay: Payer: Medicare Other | Admitting: Neurology

## 2022-02-14 DEATH — deceased

## 2022-02-16 ENCOUNTER — Ambulatory Visit: Payer: Medicare Other | Admitting: Internal Medicine

## 2022-03-11 ENCOUNTER — Ambulatory Visit: Payer: Medicare Other | Admitting: Cardiology

## 2022-11-03 ENCOUNTER — Encounter (INDEPENDENT_AMBULATORY_CARE_PROVIDER_SITE_OTHER): Payer: Medicare Other | Admitting: Ophthalmology
